# Patient Record
Sex: Female | Born: 1940 | Race: White | Hispanic: No | Smoking: Current every day smoker
Health system: Southern US, Community
[De-identification: ages and names within clinical notes are randomized; demographics above are authoritative.]

## PROBLEM LIST (undated history)

## (undated) DIAGNOSIS — I701 Atherosclerosis of renal artery: Secondary | ICD-10-CM

## (undated) DIAGNOSIS — E039 Hypothyroidism, unspecified: Secondary | ICD-10-CM

## (undated) DIAGNOSIS — K219 Gastro-esophageal reflux disease without esophagitis: Secondary | ICD-10-CM

## (undated) DIAGNOSIS — N183 Chronic kidney disease, stage 3 unspecified: Secondary | ICD-10-CM

## (undated) DIAGNOSIS — Z87442 Personal history of urinary calculi: Secondary | ICD-10-CM

## (undated) DIAGNOSIS — I5189 Other ill-defined heart diseases: Secondary | ICD-10-CM

## (undated) DIAGNOSIS — M5090 Cervical disc disorder, unspecified, unspecified cervical region: Secondary | ICD-10-CM

## (undated) DIAGNOSIS — Z9889 Other specified postprocedural states: Secondary | ICD-10-CM

## (undated) DIAGNOSIS — J449 Chronic obstructive pulmonary disease, unspecified: Secondary | ICD-10-CM

## (undated) DIAGNOSIS — S32599A Other specified fracture of unspecified pubis, initial encounter for closed fracture: Secondary | ICD-10-CM

## (undated) DIAGNOSIS — I2510 Atherosclerotic heart disease of native coronary artery without angina pectoris: Secondary | ICD-10-CM

## (undated) DIAGNOSIS — D649 Anemia, unspecified: Secondary | ICD-10-CM

## (undated) DIAGNOSIS — R112 Nausea with vomiting, unspecified: Secondary | ICD-10-CM

## (undated) DIAGNOSIS — I779 Disorder of arteries and arterioles, unspecified: Secondary | ICD-10-CM

## (undated) DIAGNOSIS — I739 Peripheral vascular disease, unspecified: Secondary | ICD-10-CM

## (undated) DIAGNOSIS — E781 Pure hyperglyceridemia: Secondary | ICD-10-CM

## (undated) DIAGNOSIS — I219 Acute myocardial infarction, unspecified: Secondary | ICD-10-CM

## (undated) DIAGNOSIS — I480 Paroxysmal atrial fibrillation: Secondary | ICD-10-CM

## (undated) DIAGNOSIS — I10 Essential (primary) hypertension: Secondary | ICD-10-CM

## (undated) HISTORY — PX: CARDIAC CATHETERIZATION: SHX172

## (undated) HISTORY — DX: Atherosclerotic heart disease of native coronary artery without angina pectoris: I25.10

## (undated) HISTORY — DX: Cervical disc disorder, unspecified, unspecified cervical region: M50.90

## (undated) HISTORY — PX: CHOLECYSTECTOMY: SHX55

## (undated) HISTORY — PX: TOTAL ABDOMINAL HYSTERECTOMY W/ BILATERAL SALPINGOOPHORECTOMY: SHX83

## (undated) HISTORY — PX: ABDOMINAL HYSTERECTOMY: SHX81

## (undated) HISTORY — PX: CORONARY ANGIOPLASTY: SHX604

## (undated) HISTORY — PX: CERVICAL DISC SURGERY: SHX588

## (undated) HISTORY — DX: Essential (primary) hypertension: I10

## (undated) HISTORY — PX: CARDIOVERSION: SHX1299

## (undated) HISTORY — DX: Pure hyperglyceridemia: E78.1

## (undated) HISTORY — DX: Disorder of arteries and arterioles, unspecified: I77.9

## (undated) HISTORY — PX: TOENAIL EXCISION: SHX183

## (undated) HISTORY — PX: SHOULDER SURGERY: SHX246

## (undated) HISTORY — DX: Peripheral vascular disease, unspecified: I73.9

---
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
## 1999-06-04 ENCOUNTER — Ambulatory Visit (HOSPITAL_COMMUNITY): Admission: RE | Admit: 1999-06-04 | Discharge: 1999-06-04 | Payer: Self-pay | Admitting: *Deleted

## 2000-07-05 HISTORY — PX: CATARACT EXTRACTION: SUR2

## 2001-07-05 DIAGNOSIS — I219 Acute myocardial infarction, unspecified: Secondary | ICD-10-CM

## 2001-07-05 HISTORY — DX: Acute myocardial infarction, unspecified: I21.9

## 2001-09-25 ENCOUNTER — Encounter: Payer: Self-pay | Admitting: Family Medicine

## 2001-09-25 ENCOUNTER — Ambulatory Visit (HOSPITAL_COMMUNITY): Admission: RE | Admit: 2001-09-25 | Discharge: 2001-09-25 | Payer: Self-pay | Admitting: Family Medicine

## 2002-03-17 ENCOUNTER — Emergency Department (HOSPITAL_COMMUNITY): Admission: EM | Admit: 2002-03-17 | Discharge: 2002-03-17 | Payer: Self-pay | Admitting: *Deleted

## 2002-03-17 ENCOUNTER — Encounter: Payer: Self-pay | Admitting: *Deleted

## 2002-05-03 ENCOUNTER — Ambulatory Visit (HOSPITAL_COMMUNITY): Admission: RE | Admit: 2002-05-03 | Discharge: 2002-05-03 | Payer: Self-pay | Admitting: Family Medicine

## 2002-05-03 ENCOUNTER — Encounter: Payer: Self-pay | Admitting: Family Medicine

## 2002-10-22 ENCOUNTER — Encounter: Payer: Self-pay | Admitting: *Deleted

## 2002-10-22 ENCOUNTER — Inpatient Hospital Stay (HOSPITAL_COMMUNITY): Admission: RE | Admit: 2002-10-22 | Discharge: 2002-10-24 | Payer: Self-pay | Admitting: *Deleted

## 2003-02-22 ENCOUNTER — Ambulatory Visit (HOSPITAL_COMMUNITY): Admission: RE | Admit: 2003-02-22 | Discharge: 2003-02-22 | Payer: Self-pay | Admitting: Family Medicine

## 2003-02-22 ENCOUNTER — Encounter: Payer: Self-pay | Admitting: Family Medicine

## 2004-04-21 ENCOUNTER — Ambulatory Visit (HOSPITAL_COMMUNITY): Admission: RE | Admit: 2004-04-21 | Discharge: 2004-04-21 | Payer: Self-pay | Admitting: Family Medicine

## 2004-05-04 ENCOUNTER — Ambulatory Visit (HOSPITAL_COMMUNITY): Admission: RE | Admit: 2004-05-04 | Discharge: 2004-05-04 | Payer: Self-pay | Admitting: Family Medicine

## 2004-09-16 ENCOUNTER — Ambulatory Visit (HOSPITAL_COMMUNITY): Admission: RE | Admit: 2004-09-16 | Discharge: 2004-09-16 | Payer: Self-pay | Admitting: *Deleted

## 2004-09-23 ENCOUNTER — Ambulatory Visit (HOSPITAL_COMMUNITY): Admission: RE | Admit: 2004-09-23 | Discharge: 2004-09-23 | Payer: Self-pay | Admitting: *Deleted

## 2006-02-04 ENCOUNTER — Ambulatory Visit (HOSPITAL_COMMUNITY): Admission: RE | Admit: 2006-02-04 | Discharge: 2006-02-04 | Payer: Self-pay | Admitting: Family Medicine

## 2006-07-28 ENCOUNTER — Ambulatory Visit (HOSPITAL_COMMUNITY): Admission: RE | Admit: 2006-07-28 | Discharge: 2006-07-28 | Payer: Self-pay | Admitting: Family Medicine

## 2006-09-16 ENCOUNTER — Inpatient Hospital Stay (HOSPITAL_COMMUNITY): Admission: RE | Admit: 2006-09-16 | Discharge: 2006-09-18 | Payer: Self-pay | Admitting: Neurosurgery

## 2006-11-04 ENCOUNTER — Ambulatory Visit (HOSPITAL_COMMUNITY): Admission: RE | Admit: 2006-11-04 | Discharge: 2006-11-04 | Payer: Self-pay | Admitting: Orthopaedic Surgery

## 2007-08-04 ENCOUNTER — Ambulatory Visit (HOSPITAL_COMMUNITY): Admission: RE | Admit: 2007-08-04 | Discharge: 2007-08-04 | Payer: Self-pay | Admitting: Family Medicine

## 2007-11-10 ENCOUNTER — Ambulatory Visit (HOSPITAL_COMMUNITY): Admission: RE | Admit: 2007-11-10 | Discharge: 2007-11-10 | Payer: Self-pay | Admitting: Family Medicine

## 2008-03-15 ENCOUNTER — Ambulatory Visit (HOSPITAL_COMMUNITY): Admission: RE | Admit: 2008-03-15 | Discharge: 2008-03-15 | Payer: Self-pay | Admitting: Family Medicine

## 2008-03-25 ENCOUNTER — Emergency Department: Payer: Self-pay | Admitting: Emergency Medicine

## 2008-04-15 ENCOUNTER — Ambulatory Visit: Payer: Self-pay | Admitting: Specialist

## 2008-07-05 HISTORY — PX: OTHER SURGICAL HISTORY: SHX169

## 2008-08-23 ENCOUNTER — Ambulatory Visit (HOSPITAL_COMMUNITY): Admission: RE | Admit: 2008-08-23 | Discharge: 2008-08-23 | Payer: Self-pay | Admitting: Family Medicine

## 2009-02-07 ENCOUNTER — Ambulatory Visit (HOSPITAL_COMMUNITY): Admission: RE | Admit: 2009-02-07 | Discharge: 2009-02-07 | Payer: Self-pay | Admitting: Orthopedic Surgery

## 2009-04-04 ENCOUNTER — Ambulatory Visit (HOSPITAL_COMMUNITY): Admission: RE | Admit: 2009-04-04 | Discharge: 2009-04-04 | Payer: Self-pay | Admitting: Orthopedic Surgery

## 2009-09-12 ENCOUNTER — Ambulatory Visit (HOSPITAL_COMMUNITY): Admission: RE | Admit: 2009-09-12 | Discharge: 2009-09-12 | Payer: Self-pay | Admitting: Family Medicine

## 2009-10-03 ENCOUNTER — Ambulatory Visit (HOSPITAL_COMMUNITY): Admission: RE | Admit: 2009-10-03 | Discharge: 2009-10-03 | Payer: Self-pay | Admitting: Family Medicine

## 2009-11-14 ENCOUNTER — Ambulatory Visit (HOSPITAL_COMMUNITY): Admission: RE | Admit: 2009-11-14 | Discharge: 2009-11-14 | Payer: Self-pay | Admitting: Cardiology

## 2010-09-07 ENCOUNTER — Other Ambulatory Visit (HOSPITAL_COMMUNITY): Payer: Self-pay | Admitting: Family Medicine

## 2010-09-07 DIAGNOSIS — Z139 Encounter for screening, unspecified: Secondary | ICD-10-CM

## 2010-09-27 LAB — CREATININE, SERUM: GFR calc Af Amer: >60 mL/min (ref >60)

## 2010-10-09 ENCOUNTER — Ambulatory Visit (HOSPITAL_COMMUNITY): Payer: PRIVATE HEALTH INSURANCE

## 2010-10-16 ENCOUNTER — Ambulatory Visit (HOSPITAL_COMMUNITY)
Admission: RE | Admit: 2010-10-16 | Discharge: 2010-10-16 | Disposition: A | Discharge status: Home / Self Care | Payer: PRIVATE HEALTH INSURANCE | Source: Ambulatory Visit | Attending: Family Medicine | Admitting: Family Medicine

## 2010-10-16 DIAGNOSIS — Z139 Encounter for screening, unspecified: Secondary | ICD-10-CM

## 2010-10-16 DIAGNOSIS — Z1231 Encounter for screening mammogram for malignant neoplasm of breast: Secondary | ICD-10-CM | POA: Insufficient documentation

## 2010-11-06 ENCOUNTER — Other Ambulatory Visit (HOSPITAL_COMMUNITY): Payer: Self-pay | Admitting: Family Medicine

## 2010-11-06 DIAGNOSIS — I2510 Atherosclerotic heart disease of native coronary artery without angina pectoris: Secondary | ICD-10-CM

## 2010-11-13 ENCOUNTER — Ambulatory Visit (HOSPITAL_COMMUNITY): Payer: PRIVATE HEALTH INSURANCE

## 2010-11-20 NOTE — H&P (Signed)
[name], SCIANDRA NO.:  [id number]   MEDICAL RECORD NO.:  [id number]          PATIENT TYPE:  INP   LOCATION:  3040                         FACILITY:  [facility]   PHYSICIAN:  Leeroy Cha, M.D.   DATE OF BIRTH:  September 23, 1940   DATE OF ADMISSION:  09/16/2006  DATE OF DISCHARGE:                              HISTORY & PHYSICAL   HISTORY OF PRESENT ILLNESS:  Ms. Matkin is a lady who came to my office  complaining of neck pain radiation to the shoulder for several months.  The pain is getting worse.  She feels that is it worse in the morning  and late in the afternoon, and any type of neck movement increases the  pain.  She was sent to use for evaluation after the findings on the MRI.   PAST MEDICAL HISTORY:  1. Foot surgery.  2. Cholecystectomy.  3. Hysterectomy.   ALLERGIES:  1. PENICILLIN.  2. SULFA.  3. CODEINE.   FAMILY HISTORY:  Negative.   REVIEW OF SYSTEMS:  Positive for foot pain, neck pain, and high  cholesterol.   PHYSICAL EXAMINATION:  HEENT:  Normal.  NECK:  She has pain with mobility of the cervical spine.  LUNGS:  Clear.  HEART:  Heart sounds normal.  ABDOMEN:  Normal.  EXTREMITIES:  Normal pulses.  NEUROLOGIC:  Mental status is apparently normal.  Strength shows  weakness of the triceps.  The strength is symmetrical in the upper and  lower extremities.  Coordination normal.   The x-rays show that she has degenerative disk disease at the lower C5-  6, C7.  The MRI showed that she has a herniated disk at the level C6-C7  centered to the right with displacement of the spinal cord and foraminal  stenosis without facet arthropathy at the level of C7-C1.   CLINICAL IMPRESSION:  C6-C7 herniated disk, C7 thoracic wall spondylosis  with radiculopathy.   RECOMMENDATIONS:  We talked about conservative treatment versus surgery.  The patient feels that the pain is getting worse.  She cannot live with  the pain, and she wants to proceed with surgery.   The procedure will be  anterior  6-7, 7-1 diskectomy and fusion.  She knows about the risks of infection,  CSF leak, worsening pain, no improvement whatsoever, failure of the bone  graft and the hardware, damage to carotid artery, vertebral, no  improvement, and reversibility of surgery from a posterior approach.           ______________________________  Leeroy Cha, M.D.     EB/MEDQ  D:  09/16/2006  T:  09/17/2006  Job:  [id number]

## 2010-11-20 NOTE — Discharge Summary (Signed)
   NAME:  [name], SODERMAN NO.:  [id number]   MEDICAL RECORD NO.:  [id number]                   PATIENT TYPE:  OIB   LOCATION:  [id number]                                 FACILITY:  [facility]   PHYSICIAN:  Octavia Heir, M.D.             DATE OF BIRTH:  Jun 13, 1941   DATE OF ADMISSION:  10/22/2002  DATE OF DISCHARGE:  10/24/2002                                 DISCHARGE SUMMARY   ADDENDUM:  The patient's third set of enzymes continued to climb to 181 with 26 MB.  She was kept overnight.  Her fourth set of enzymes dropped down to 167 with  21 MB.  She is asymptomatic.  We feel she can be discharged on October 24, 2002 and will follow up with Dr. Tami Ribas on Nov 08, 2002.  She has been  instructed to do no strenuous activity and no driving until she sees him  back.      Erlene Quan, P.A.                      Octavia Heir, M.D.    Meryl Dare  D:  10/24/2002  T:  10/25/2002  Job:  [id number]

## 2010-11-20 NOTE — Op Note (Signed)
NAMERUVI, CRIVELLI NO.:  [id number]   MEDICAL RECORD NO.:  [id number]          PATIENT TYPE:  INP   LOCATION:  3040                         FACILITY:  [facility]   PHYSICIAN:  Leeroy Cha, M.D.   DATE OF BIRTH:  06/26/1941   DATE OF PROCEDURE:  09/16/2006  DATE OF DISCHARGE:                               OPERATIVE REPORT   PREOPERATIVE DIAGNOSIS:  C6-C7 and C7-T1 spondylosis, herniated disc,  with right radiculopathy.   POSTOPERATIVE DIAGNOSIS:  C6-C7 and C7-T1 spondylosis, herniated disc,  with right radiculopathy.   PROCEDURE:  Anterior C6-C7 and C7-T1 discectomy, decompression of spinal  cord, foraminotomy, interbody fusion with allograft plate, microscope.   SURGEON:  Leeroy Cha, M.D.   ASSISTANT:  Ophelia Charter, M.D.   CLINICAL HISTORY:  The patient was admitted because of neck pain with  radiation to the right upper extremity.  X-rays showed degenerative disc  disease and spondylosis plus herniated disc at the level of C6-C7 and C7-  T1.  Surgery was advised and the risks were explained in the history and  physical.   DESCRIPTION OF PROCEDURE:  The patient was taken to the OR.  After  intubation, the neck was cleaned with DuraPrep.  Then, a transverse  incision through the skin, subcutaneous tissue, and platysma was carried  out.  Because she has a short neck, we put a needle and, indeed, that  was at the level of C5-C6.  From there on, we identified C6-C7 and C7-  T1.  The patient had two large osteophytes which was removed.  We opened  the anterior ligament. At the level C6-C7, we had to drill it because it  was calcified.  Nevertheless, once we found soft tissue, total gross  discectomy was achieved.  The same procedure was done at the level of C6-  C7.  We brought the microscope into the area and with the drill, we  decompressed the spinal cord.  The posterior ligament was opened and at  the level of C7-T1 to the right side, there was  4-5 fragments of disc.  Decompression of the C8 nerve root was achieved.  At the level of C6-C7,  we found quite a bit of swelling in that area.  Decompression was  achieved.  From then on, the endplates were drilled.  At the level of C7-  T1, we put an allograft of 7 mm and at the level of the C6-C7, one of 6  mm.  Then, a plate using six screw was done.  We attempted to do an x-  ray but we were able only to see the upper part of C6.  A drain was left  in the epidural space and the wound was closed with Vicryl and Steri-  Strips.           ______________________________  Leeroy Cha, M.D.     EB/MEDQ  D:  09/16/2006  T:  09/18/2006  Job:  [id number]

## 2010-11-20 NOTE — Cardiovascular Report (Signed)
NAME:  [name], [name] NO.:  [id number]   MEDICAL RECORD NO.:  [id number]                   PATIENT TYPE:  OIB   LOCATION:  6524                                 FACILITY:  [facility]   PHYSICIAN:  Octavia Heir, M.D.             DATE OF BIRTH:  1940-07-12   DATE OF PROCEDURE:  10/22/2002  DATE OF DISCHARGE:                              CARDIAC CATHETERIZATION   PROCEDURES PERFORMED:  1. Left heart catheterization.  2. Coronary angiography.  3. Left ventriculogram.  4. Left anterior descending-intravascular ultrasound imaging.  5. Placement of intracoronary stent.   CARDIOLOGIST:  Octavia Heir, M.D.   COMPLICATIONS:  None.   INDICATIONS FOR PROCEDURE:  The patient is a 70 year old female, patient of  Dr. Lemmie Evens, with a history of hypertension, tobacco use,  hyperlipidemia, and history of Cardiolite scan revealing inferior wall scar  with peri-infarct ischemia.  Subsequent cardiac catheterization in 2000  revealed 60% mid LAD with noncritical disease of the circumflex and RCA  system with normal EF.  She has had increasing shortness of breath and chest  pain, and does continue to smoke.  The patient is now admitted for repeat  catheterization.   DESCRIPTION OF PROCEDURE:  After obtaining informed consent the patient was  brought to the cardiac cath lab.  The right and left groins were shaved,  prepped and draped in the usual sterile fashion.  ECG monitoring was  established.  Using the modified Seldinger technique a #6 French arterial  sheath was inserted into the right femoral artery.  Six French diagnostic  catheter was used to perform diagnostic angiography.  This revealed short  left main, which appears normal.  The LAD is a medium size vessel that  courses the apex and gives rise to two diagonal branches.  The LAD has 40%  proximal and diffuse 80% mid-vessel stenotic lesion.  The first diagonal is  a medium size vessel that  bifurcates distally and has a 60% proximal lesion.  The second diagonal is a small vessel with no significant disease.  The left  circumflex is a medium size vessel, which courses the AV groove and give  rise to three obtuse marginal branches.  The AV groove circ has no  significant disease.  The first OM is a medium size vessel with no  significant disease.  The second OM is a large vessel that bifurcates  distally and has no significant disease.  The third OM is a medium size  vessel which bifurcates in its proximal segment and has a 6o% proximal  lesion in the upper and lower bifurcation.  The right coronary artery is a  medium size vessel, which is dominant and gives rise to the PDA as well as a  small posterolateral branch.  There is diffuse 50% proximal narrowing as  well as 50% mid and 20% distal stenosis.  The PDA has no significant  disease.   Left ventriculogram reveals significant ectopy with preserved EF at 55-60.   HEMODYNAMICS:  Systemic arterial pressure 134/57.  LV systolic pressure  [id number].  LVEDP of 13.   INTERVENTIONAL PROCEDURE:  LAD-mid:  Following diagnostic angiography a #6  French JL 4 guiding catheter was then placed and engaged the left coronary  ostium.  Next, a 0.014 short Fortaz marker wire was advanced down the  guiding catheter, positioned in the proximal LAD and measurements were  obtained.  The guide wire was then positioned in the distal LAD without  difficulty.  Next a Titanic catheter was then advanced to the  mid LAD and mechanical pullback was then performed.  In the mid LAD the  vessel diameter was approximately 2.7 x 3.0 mm with a luminal diameter of  approximately 1.5 x 2.0 mm.  Imaging catheter was then removed and a CYPHER  2.75 x 23 mm stent was then positioned in the mid LAD stenotic lesion.  The  stent was then deployed to a maximum of 18 atmospheres for a total of one  minute.  Follow up angiogram revealed no evidence of  dissecting thrombus and  TIMI III flow in the distal vessel.   IV Angiomax was used throughout the procedure.   Final orthogonal angiograms revealed less than 10% residual stenosis in the  mid LAD stenotic lesion with TIMI III flow to the distal vessel.  At this  point we concluded the procedure.  All balloon, __________ and catheters  were removed.  Hemostatic sheaths were centered in place and the patient was  taken back to the ward in stable condition.   CONCLUSION:  1. Successful intravascular ultrasound imaging and placement of a CYPHER     2.75 x 23 mm coronary stent in the mid left anterior descending stenotic     lesion.  2. Adjunct use of Angiomax infusion.                                                 Octavia Heir, M.D.    RHM/MEDQ  D:  10/22/2002  T:  10/22/2002  Job:  [id number]   cc:   Estill Bamberg. Karie Kirks, M.D.  [address] [place], [place] [address]  Fax: [phone]

## 2010-11-20 NOTE — Procedures (Signed)
   [name], [name]                            ACCOUNT NO.:  [id number]   MEDICAL RECORD NO.:  [id number]                   PATIENT TYPE:  EMS   LOCATION:  ED                                   FACILITY:  [facility]   PHYSICIAN:  Edward L. Luan Pulling, M.D.             DATE OF BIRTH:  1941/04/02   DATE OF PROCEDURE:  DATE OF DISCHARGE:  03/17/2002                                EKG INTERPRETATION   IMPRESSION:  The rhythm is sinus rhythm with a rate in the 70s.  There is  minimal ST elevation inferiorly which may be of no significant, but clinical  correlation is suggested.  Otherwise, normal electrocardiogram.                                               Edward L. Luan Pulling, M.D.    ELH/MEDQ  D:  04/25/2002  T:  04/26/2002  Job:  [id number]

## 2010-11-20 NOTE — Cardiovascular Report (Signed)
NAME:  [name], [name] NO.:  [id number]   MEDICAL RECORD NO.:  [id number]          PATIENT TYPE:  OIB   LOCATION:  2854                         FACILITY:  [facility]   PHYSICIAN:  Octavia Heir, MD  DATE OF BIRTH:  06-25-1941   DATE OF PROCEDURE:  09/23/2004  DATE OF DISCHARGE:                              CARDIAC CATHETERIZATION   PROCEDURES:  1.  Left heart catheterization.  2.  Coronary angiography.  3.  Left ventriculogram.   ATTENDING:  Octavia Heir, M.D.   COMPLICATIONS:  None.   INDICATIONS:  [name] is a 70 year old female, a patient of Estill Bamberg.  Karie Kirks, M.D., with a history of hypertension, hyperlipidemia, ongoing  tobacco use, history of CAD with stenting of her LAD in April of 2004 using  a Cypher 2.7 x 5 x 20 mm stent.  At that time, she was noted to have  noncritical disease in the remainder of her LAD, diagonal and obtuse  marginal, as well as RCA, with a normal EF.  She recently has been  complaining of increasing chest pain.  She is now for repeat catheterization  to reassess her CA.   DESCRIPTION OF OPERATION:  After getting informed written consent, the  patient was brought to the cardiac catheterization.  The right and left  groins were shaved, prepped and draped in the usual sterile fashion.  Anesthesia monitors were established.  Using modified Seldinger technique, a  #6 French arterial sheath entered the right femoral artery.  Then 6 French  diagnostic catheters were used to perform diagnostic angiography.   The left main was a large short vessel with no significant disease.   The LAD was a medium size vessel which gives access to two diagonal  branches.  The LAD was coarsely eroded in its proximal segment up to 40%.  There was a stent noted in the early mid portion of the LAD which was widely  patent.  The mid and distal portion of the LAD was irregular, but had no  high-grade stenosis.   The first diagonal was a medium  size vessel which bifurcates distally with a  30% ostial lesion.  The second diagonal was a small vessel with no  significant disease.   The left circumflex is a codominant which gives rise to two obtuse marginal  branches, as well as the PDA.  The AV groove circumflex has a 20% ostial  lesion.  The remainder of the AV groove circumflex has no significant  disease.   The OM is size vessel with 50% ostial lesion.  The second OM is a medium  size vessel which bifurcates distally with a 60% proximal lesion.  The PDA  is a medium size vessel with a 40% proximal lesion.   The right coronary artery is a medium size vessel which is also codominant  and gives rise to the PDA.  The RCA is noted to have diffuse 40-50% mid  vessel stenosis.   The left ventriculogram reveals a preserved EF of 50%.   HEMODYNAMICS:  Systemic arterial pressure 124/63.  LV systemic pressure  123/14.  LVEDP of 19.   CONCLUSION:  1.  Noncritical coronary artery disease.  2.  Normal left ventricular systolic function.      RHM/MEDQ  D:  09/23/2004  T:  09/23/2004  Job:  [id number]   cc:   Estill Bamberg. Karie Kirks, M.D.  [address] [place], [place] [address]  Fax: [phone]

## 2010-11-20 NOTE — Discharge Summary (Signed)
NAME:  [name], [name] NO.:  [id number]   MEDICAL RECORD NO.:  [id number]                   PATIENT TYPE:  OIB   LOCATION:  [id number]                                 FACILITY:  [facility]   PHYSICIAN:  Octavia Heir, M.D.             DATE OF BIRTH:  Oct 13, 1940   DATE OF ADMISSION:  10/22/2002  DATE OF DISCHARGE:  10/23/2002                                 DISCHARGE SUMMARY   DISCHARGE DIAGNOSES:  1. 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  2. History of coronary disease with previous catheterization in 2000 showing     60% left anterior descending, 50% obtuse marginal, 50% right coronary     artery with normal left ventricular function.  3. Chronic obstructive pulmonary disease and smoking.  4. Controlled hypertension.  5. Hyperlipidemia, currently not treated.   HOSPITAL COURSE:  The patient is a 70 year old female followed by Dr.  Karie Kirks with multiple risk factors for progression of coronary disease.  She had a catheterization that revealed moderate disease with normal LV  function.  She is seen in the office on September 27, 2002, with recurrent chest  pain concerning for unstable angina.  She was set up for repeat  catheterization which was done October 22, 2002, by Dr. Tami Ribas.  This  revealed a 50% RCA, 80% mid LAD, 60% third diagonal.  EF was 60%.  Dr.  Tami Ribas did perform IVUS of the LAD and she underwent CYPHER stent  placement.  CKs post procedure were elevated with a peak of 166 and 28 MB.  She tolerated this well.  Her EKG showed sinus rhythm with new T-wave  inversion V3 and V4 post procedure.  Dr. Tami Ribas feels she can be discharged  late on October 23, 2002, unless her third CK-MB continues to decline.  She is  pain-free currently.   DISCHARGE MEDICATIONS:  1. Plavix 75 mg a day for six months.  2. Aspirin 81 mg a day.  3. Accupril 20 mg a day.  4. Norvasc 5 mg a day.  5.  Toprol XL 50 mg a day.  6. Imdur 30 mg a day.  7. Nitroglycerin sublingual p.r.n.   We are working with her to try and get samples of her medications as she is  self-pay.   LABORATORY DATA:  White count 7.6, hemoglobin 12, hematocrit 35, platelets  266.  Sodium 138, potassium 4.1, BUN 5, creatinine 0.3.  Second CK peaked as  noted at 166 with 28 MB, troponin 1.4.   EKG on admission showed sinus rhythm, sinus bradycardia with no acute  changes.  EKG on October 23, 2002, showed sinus rhythm with nonspecific  changes with biphasic T-wave in V3 and V4.    DISPOSITION:  The patient will be discharged later today pending her third  CK-MB.  She has been instructed to perform no  strenuous activity for at  least two weeks.  She will see Dr. Tami Ribas on Nov 08, 2002, at 11 a.m.  We  did counsel her about smoking.     Erlene Quan, P.A.                      Octavia Heir, M.D.    Meryl Dare  D:  10/23/2002  T:  10/24/2002  Job:  [id number]   cc:   Estill Bamberg. Karie Kirks, M.D.  [address] [place], [place] [address]  Fax: [phone]

## 2010-11-27 ENCOUNTER — Ambulatory Visit (HOSPITAL_COMMUNITY)
Admission: RE | Admit: 2010-11-27 | Discharge: 2010-11-27 | Disposition: A | Discharge status: Home / Self Care | Payer: PRIVATE HEALTH INSURANCE | Source: Ambulatory Visit | Attending: Family Medicine | Admitting: Family Medicine

## 2010-11-27 DIAGNOSIS — I2510 Atherosclerotic heart disease of native coronary artery without angina pectoris: Secondary | ICD-10-CM | POA: Insufficient documentation

## 2010-11-27 DIAGNOSIS — I6529 Occlusion and stenosis of unspecified carotid artery: Secondary | ICD-10-CM | POA: Insufficient documentation

## 2010-12-28 ENCOUNTER — Ambulatory Visit (INDEPENDENT_AMBULATORY_CARE_PROVIDER_SITE_OTHER): Payer: PRIVATE HEALTH INSURANCE | Admitting: Internal Medicine

## 2011-02-03 ENCOUNTER — Encounter (INDEPENDENT_AMBULATORY_CARE_PROVIDER_SITE_OTHER): Payer: Self-pay

## 2011-02-24 ENCOUNTER — Encounter (INDEPENDENT_AMBULATORY_CARE_PROVIDER_SITE_OTHER): Payer: Self-pay | Admitting: *Deleted

## 2011-03-24 ENCOUNTER — Telehealth (INDEPENDENT_AMBULATORY_CARE_PROVIDER_SITE_OTHER): Payer: Self-pay | Admitting: *Deleted

## 2011-03-24 ENCOUNTER — Ambulatory Visit (INDEPENDENT_AMBULATORY_CARE_PROVIDER_SITE_OTHER): Payer: PRIVATE HEALTH INSURANCE | Admitting: Internal Medicine

## 2011-03-24 ENCOUNTER — Encounter (INDEPENDENT_AMBULATORY_CARE_PROVIDER_SITE_OTHER): Payer: Self-pay | Admitting: Internal Medicine

## 2011-03-24 ENCOUNTER — Encounter (INDEPENDENT_AMBULATORY_CARE_PROVIDER_SITE_OTHER): Payer: Self-pay | Admitting: *Deleted

## 2011-03-24 VITALS — BP 130/58 | HR 76 | Ht 68.0 in | Wt 188.6 lb

## 2011-03-24 DIAGNOSIS — R1310 Dysphagia, unspecified: Secondary | ICD-10-CM

## 2011-03-24 DIAGNOSIS — R1314 Dysphagia, pharyngoesophageal phase: Secondary | ICD-10-CM

## 2011-03-24 NOTE — Telephone Encounter (Signed)
EGD/ED sch'd 05/05/11 @ 2:00 (1:00), asa 2 days

## 2011-03-24 NOTE — Progress Notes (Signed)
Subjective:     Patient ID: [name], female   DOB: 08-Oct-1940, 70 y.o.   MRN: [id number]  HPI  [name] is a 70 yr old female referred to our office for dysphagia. She tells me sometimes when she eats foods will lodge in her mid-esophagus. She has to burp to get the bolus up or throw it up.  It is occuring about once every 2 weeks. Progressively worsened.  Symptoms x 1-2 yrs.  Meats and raw lettuce will lodge. No problems swallowing pills. Appetite is good. No weight loss. No abdominal pain,. Usually has a BM every day. Brown in color. No bright red rectal bleeding or melena.  Review of Systems  See hpi  Current Outpatient Prescriptions  Medication Sig Dispense Refill  . albuterol (PROVENTIL) (2.5 MG/3ML) 0.083% nebulizer solution Take by nebulization every 6 (six) hours as needed.        Marland Kitchen amLODipine (NORVASC) 10 MG tablet Take 10 mg by mouth daily.        . Ascorbic Acid (VITAMIN C) 100 MG tablet Take 100 mg by mouth daily.        Marland Kitchen aspirin 81 MG tablet Take 81 mg by mouth daily.        . clonazePAM (KLONOPIN) 0.5 MG tablet Take 0.5 mg by mouth at bedtime as needed.       . clonazePAM (KLONOPIN) 0.5 MG tablet Take 0.5 mg by mouth 2 (two) times daily as needed.        . clopidogrel (PLAVIX) 75 MG tablet Take 75 mg by mouth daily.        Marland Kitchen estrogens, conjugated, (PREMARIN) 1.25 MG tablet Take 1.25 mg by mouth daily as needed.        . isosorbide mononitrate (IMDUR) 60 MG 24 hr tablet Take 60 mg by mouth daily.        Marland Kitchen lisinopril-hydrochlorothiazide (PRINZIDE,ZESTORETIC) 20-12.5 MG per tablet Take 1 tablet by mouth daily.        . metoprolol succinate (TOPROL-XL) 25 MG 24 hr tablet Take 25 mg by mouth 2 (two) times daily before a meal.        . omega-3 acid ethyl esters (LOVAZA) 1 G capsule Take 2 g by mouth 2 (two) times daily.        . promethazine (PHENERGAN) 25 MG tablet Take 25 mg by mouth every 6 (six) hours as needed.         History   Social History Narrative  . No narrative on  file   Past Medical History  Diagnosis Date  . Dysphagia   . CAD (coronary artery disease)   . MI (myocardial infarction)     2003 with one stent placement  . Hypertension     2002  . Hypertriglyceridemia    Allergies  Allergen Reactions  . Ceclor (Cefaclor)   . Codeine     Cramps, nausea  . Penicillins     Near death experience,   . Sulfa Antibiotics     rash   Family Status  Relation Status Death Age  . Mother Deceased     CVAs  . Father Deceased     MI at age 39  . Sister      One deceased from an MI, one deceased from pancreatic cancer, rest are in fair health  . Brother Research scientist (medical)  . Child Deceased     occupational accident. Husband deceased from NALD  Objective:   Physical Exam Filed Vitals:   03/24/11 1417  BP: 130/58  Pulse: 76   Filed Vitals:   03/24/11 1417  Height: 5\' 8"  (1.727 m)  Weight: 188 lb 9.6 oz (85.548 kg)    Alert and oriented. Skin warm and dry. Oral mucosa is moist. Natural teeth in poor condition with upper edentulous.  Sclera anicteric, conjunctivae is pink. Thyroid not enlarged. No cervical lymphadenopathy. Lungs clear. Heart regular rate and rhythm.  Abdomen is soft. Bowel sounds are positive. No hepatomegaly. No abdominal masses felt. No tenderness.  No edema to lower extremities. Patient is alert and oriented.      Assessment:    Solid food dysphagia.  Stricture or web need to be ruled out    Plan:    EGD/ED in the near future.   The risks and benefits such as perforation, bleeding, and infection were reviewed with the patient and is agreeable.

## 2011-04-07 ENCOUNTER — Telehealth (INDEPENDENT_AMBULATORY_CARE_PROVIDER_SITE_OTHER): Payer: Self-pay | Admitting: *Deleted

## 2011-04-07 ENCOUNTER — Other Ambulatory Visit (INDEPENDENT_AMBULATORY_CARE_PROVIDER_SITE_OTHER): Payer: Self-pay | Admitting: *Deleted

## 2011-04-07 ENCOUNTER — Encounter (INDEPENDENT_AMBULATORY_CARE_PROVIDER_SITE_OTHER): Payer: Self-pay | Admitting: *Deleted

## 2011-04-07 DIAGNOSIS — Z1211 Encounter for screening for malignant neoplasm of colon: Secondary | ICD-10-CM

## 2011-04-07 NOTE — Telephone Encounter (Signed)
I spoke with patient. She is scheduled for an EGD/ED and a colonoscopy

## 2011-04-07 NOTE — Telephone Encounter (Signed)
You saw Ms Glazer for dysphagia and she is sch'd for EGD/ED 05/05/11, she lmom asking if she can have TCS at same time since she' never had one, please Arnoldo Hooker

## 2011-05-04 MED ORDER — SODIUM CHLORIDE 0.45 % IV SOLN
Freq: Once | INTRAVENOUS | Status: AC
  Administered 2011-05-05: 15:00:00 via INTRAVENOUS

## 2011-05-05 ENCOUNTER — Ambulatory Visit: Admit: 2011-05-05 | Payer: Self-pay | Admitting: Internal Medicine

## 2011-05-05 ENCOUNTER — Ambulatory Visit (HOSPITAL_COMMUNITY)
Admission: RE | Admit: 2011-05-05 | Discharge: 2011-05-05 | Disposition: A | Discharge status: Home / Self Care | Payer: PRIVATE HEALTH INSURANCE | Source: Ambulatory Visit | Attending: Internal Medicine | Admitting: Internal Medicine

## 2011-05-05 ENCOUNTER — Encounter (HOSPITAL_COMMUNITY): Payer: Self-pay | Admitting: *Deleted

## 2011-05-05 ENCOUNTER — Encounter (HOSPITAL_COMMUNITY)
Admission: RE | Disposition: A | Discharge status: Home / Self Care | Payer: Self-pay | Source: Ambulatory Visit | Attending: Internal Medicine

## 2011-05-05 DIAGNOSIS — J449 Chronic obstructive pulmonary disease, unspecified: Secondary | ICD-10-CM | POA: Insufficient documentation

## 2011-05-05 DIAGNOSIS — Z79899 Other long term (current) drug therapy: Secondary | ICD-10-CM | POA: Insufficient documentation

## 2011-05-05 DIAGNOSIS — Z7982 Long term (current) use of aspirin: Secondary | ICD-10-CM | POA: Insufficient documentation

## 2011-05-05 DIAGNOSIS — I10 Essential (primary) hypertension: Secondary | ICD-10-CM | POA: Insufficient documentation

## 2011-05-05 DIAGNOSIS — R1310 Dysphagia, unspecified: Secondary | ICD-10-CM

## 2011-05-05 DIAGNOSIS — J4489 Other specified chronic obstructive pulmonary disease: Secondary | ICD-10-CM | POA: Insufficient documentation

## 2011-05-05 DIAGNOSIS — K5730 Diverticulosis of large intestine without perforation or abscess without bleeding: Secondary | ICD-10-CM | POA: Insufficient documentation

## 2011-05-05 DIAGNOSIS — Z1211 Encounter for screening for malignant neoplasm of colon: Secondary | ICD-10-CM

## 2011-05-05 DIAGNOSIS — E781 Pure hyperglyceridemia: Secondary | ICD-10-CM | POA: Insufficient documentation

## 2011-05-05 DIAGNOSIS — K2990 Gastroduodenitis, unspecified, without bleeding: Secondary | ICD-10-CM

## 2011-05-05 DIAGNOSIS — K2970 Gastritis, unspecified, without bleeding: Secondary | ICD-10-CM

## 2011-05-05 HISTORY — PX: MALONEY DILATION: SHX5535

## 2011-05-05 HISTORY — PX: BALLOON DILATION: SHX5330

## 2011-05-05 HISTORY — DX: Chronic obstructive pulmonary disease, unspecified: J44.9

## 2011-05-05 HISTORY — PX: SAVORY DILATION: SHX5439

## 2011-05-05 HISTORY — DX: Gastro-esophageal reflux disease without esophagitis: K21.9

## 2011-05-05 SURGERY — ESOPHAGOGASTRODUODENOSCOPY (EGD) WITH ESOPHAGEAL DILATION
Anesthesia: Moderate Sedation

## 2011-05-05 SURGERY — COLONOSCOPY WITH ESOPHAGOGASTRODUODENOSCOPY (EGD)
Anesthesia: Moderate Sedation

## 2011-05-05 MED ORDER — PANTOPRAZOLE SODIUM 40 MG PO TBEC: 40.0000 mg | DELAYED_RELEASE_TABLET | Freq: Every day | ORAL | Status: DC

## 2011-05-05 MED ORDER — BUTAMBEN-TETRACAINE-BENZOCAINE 2-2-14 % EX AERO
INHALATION_SPRAY | CUTANEOUS | Status: DC | PRN
  Administered 2011-05-05: 2 via TOPICAL

## 2011-05-05 MED ORDER — MIDAZOLAM HCL 5 MG/5ML IJ SOLN
INTRAMUSCULAR | Status: DC | PRN
  Administered 2011-05-05: 1 mg via INTRAVENOUS
  Administered 2011-05-05: 2 mg via INTRAVENOUS
  Administered 2011-05-05: 1 mg via INTRAVENOUS
  Administered 2011-05-05: 2 mg via INTRAVENOUS

## 2011-05-05 MED ORDER — STERILE WATER FOR IRRIGATION IR SOLN
Status: DC | PRN
  Administered 2011-05-05: 16:00:00

## 2011-05-05 MED ORDER — MEPERIDINE HCL 50 MG/ML IJ SOLN
INTRAMUSCULAR | Status: AC
  Filled 2011-05-05: qty 1

## 2011-05-05 MED ORDER — MIDAZOLAM HCL 5 MG/5ML IJ SOLN
INTRAMUSCULAR | Status: AC
  Filled 2011-05-05: qty 10

## 2011-05-05 MED ORDER — MEPERIDINE HCL 50 MG/ML IJ SOLN
INTRAMUSCULAR | Status: DC | PRN
Start: 2011-05-05 — End: 2011-05-05
  Administered 2011-05-05 (×2): 25 mg via INTRAVENOUS

## 2011-05-05 NOTE — H&P (Signed)
[name] is an 70 y.o. female.   Chief Complaint:  Patient is here for EGD and possible ED and a colonoscopy. HPI:  Patient is 70 year old Caucasian female who's been having intermittent solid food dysphagia for about 18 months. It has been occurring more frequent lately. She points to the midsternal area  as the site of bolus obstruction. Have to time ago stone and other times she has to regurgitate food bolus.   She has good appetite and has not lost any weight. She denies frequent heartburn but does have intermittent regurgitation and burping. Bowels are regular no history of rectal bleeding . She has never been screened for CRC before.  Family history is negative for colorectal carcinoma  She lost a sister of pancreatic carcinoma at age 66 within few months of diagnosis.  Past Medical History  Diagnosis Date  . Dysphagia   . CAD (coronary artery disease)   . MI (myocardial infarction)     2003 with one stent placement  . Hypertension     2002  . Hypertriglyceridemia   . COPD (chronic obstructive pulmonary disease)   . GERD (gastroesophageal reflux disease)   . Kidney stone     Past Surgical History  Procedure Date  . Cholecystectomy   . Total abdominal hysterectomy w/ bilateral salpingoophorectomy   . Shoulder surgery     torn rotator cuff  . Cardiac catheterization     with stent  . Right knee arthroscopy 2010    Family History  Problem Relation Age of Onset  . Colon cancer Neg Hx    Social History:  reports that she has been smoking.  She does not have any smokeless tobacco history on file. She reports that she drinks about .6 ounces of alcohol per week. She reports that she does not use illicit drugs.  Allergies:  Allergies  Allergen Reactions  . Ceclor (Cefaclor) Nausea And Vomiting  . Codeine     Cramps, nausea  . Penicillins     Near death experience,   . Sulfa Antibiotics     rash    Medications Prior to Admission  Medication Dose Route Frequency  Provider Last Rate Last Dose  . 0.45 % sodium chloride infusion   Intravenous Once Rogene Houston, MD 20 mL/hr at 05/05/11 1438    . meperidine (DEMEROL) 50 MG/ML injection           . midazolam (VERSED) 5 MG/5ML injection            Medications Prior to Admission  Medication Sig Dispense Refill  . albuterol (PROVENTIL) (2.5 MG/3ML) 0.083% nebulizer solution Take 2.5 mg by nebulization every 6 (six) hours as needed. For shortness of breath      . amLODipine (NORVASC) 10 MG tablet Take 10 mg by mouth daily.        . Ascorbic Acid (VITAMIN C) 100 MG tablet Take 100 mg by mouth daily.       Marland Kitchen aspirin 81 MG tablet Take 81 mg by mouth daily.        . cholecalciferol (VITAMIN D) 400 UNITS TABS Take 400 Units by mouth daily.        . clonazePAM (KLONOPIN) 0.5 MG tablet Take 0.5 mg by mouth at bedtime as needed. For sleep      . estrogens, conjugated, (PREMARIN) 1.25 MG tablet Take 1.25 mg by mouth daily as needed. For hot flashes      . isosorbide mononitrate (IMDUR) 60 MG 24 hr tablet  Take 60 mg by mouth daily.        Marland Kitchen lisinopril-hydrochlorothiazide (PRINZIDE,ZESTORETIC) 20-12.5 MG per tablet Take 1 tablet by mouth daily.        . metoprolol tartrate (LOPRESSOR) 25 MG tablet Take 25 mg by mouth 2 (two) times daily.        Marland Kitchen omega-3 acid ethyl esters (LOVAZA) 1 G capsule Take 2 g by mouth 2 (two) times daily.        . promethazine (PHENERGAN) 25 MG tablet Take 25 mg by mouth every 6 (six) hours as needed. For nausea      . clonazePAM (KLONOPIN) 0.5 MG tablet Take 0.5 mg by mouth 2 (two) times daily as needed.        . clopidogrel (PLAVIX) 75 MG tablet Take 75 mg by mouth daily.        . metoprolol succinate (TOPROL-XL) 25 MG 24 hr tablet Take 25 mg by mouth 2 (two) times daily before a meal.          No results found for this or any previous visit (from the past 48 hour(s)). No results found.  Review of Systems  Constitutional: Negative for weight loss.  Gastrointestinal: Negative for  heartburn, nausea, vomiting, abdominal pain, diarrhea, constipation, blood in stool and melena.    Blood pressure 138/57, pulse 63, temperature 98 F (36.7 C), temperature source Oral, resp. rate 26, height 5\' 8"  (1.727 m), weight 180 lb (81.647 kg), SpO2 98.00%. Physical Exam  Constitutional: She appears well-developed and well-nourished.  HENT:  Mouth/Throat: Oropharynx is clear and moist.  Eyes: Conjunctivae are normal. No scleral icterus.  Neck: No thyromegaly present.  Cardiovascular: Normal rate, regular rhythm and normal heart sounds.   No murmur heard. Respiratory: Effort normal and breath sounds normal.  GI: Soft. She exhibits no distension and no mass. There is no tenderness.  Musculoskeletal: She exhibits no edema.  Lymphadenopathy:    She has no cervical adenopathy.  Neurological: She is alert.  Skin: Skin is warm and dry.     Assessment/Plan  Solid food dysphagia.  EGD and possible ED.  Average risk screening colonoscopy.  [name] U 05/05/2011, 3:25 PM

## 2011-05-05 NOTE — Op Note (Signed)
EGD and COLONOSCOPY  PROCEDURE REPORT  PATIENT:  [name]  MR#:  [id number] Birthdate:  1940/08/19, 70 y.o., female Endoscopist:  Dr. Rogene Houston, MD Referred By:  Dr. Rayne Du ref. provider found Procedure Date: 05/05/2011  Procedure:   EGD, ED  & Colonoscopy  Indications:   Patient is 70 year old Caucasian female who is 18 month history of intermittent solid food dysphagia. Is undergoing diagnostic/therapeutic EGD and average risk screening colonoscopy.            Informed Consent:  Both the procedures risks were reviewed with the patient and informed consent was obtained. Medications:  Demerol 50 mg IV Versed  6 mg IV Cetacaine spray topically for oropharyngeal anesthesia  EGD  Description of procedure:  The endoscope was introduced through the mouth and advanced to the second portion of the duodenum without difficulty or limitations. The mucosal surfaces were surveyed very carefully during advancement of the scope and upon withdrawal.  Findings:  Esophagus:   Esophageal mucosa was normal. GE junction was unremarkable without ring or stricture formation. GEJ:   42 cm Stomach:   Stomach was empty and distended very well with insufflation. Folds in the proximal stomach were normal. Examination of mucosa revealed a few erosions at gastric body and multiple erosions at antrum.  Pyloric  channel was patent. Duodenum:   Normal bulbar and post bulbar mucosa.  Therapeutic/Diagnostic Maneuvers Performed:   Esophageal dilation performed by passing the 56 French Maloney dilator to full insertion but no mucosal disruption noted.  COLONOSCOPY Description of procedure:  After a digital rectal exam was performed, that colonoscope was advanced from the anus through the rectum and colon to the area of the cecum, ileocecal valve and appendiceal orifice. The cecum was deeply intubated. These structures were well-seen and photographed for the record. From the level of the cecum and ileocecal valve,  the scope was slowly and cautiously withdrawn. The mucosal surfaces were carefully surveyed utilizing scope tip to flexion to facilitate fold flattening as needed. The scope was pulled down into the rectum where a thorough exam including retroflexion was performed.  Findings:    Prep excellent.  Normal mucosa throughout.  Few small diverticula at sigmoid colon.   Normal rectal mucosa as well as no rectal junction.  Therapeutic/Diagnostic Maneuvers Performed:   none  Complications:   none  Cecal Withdrawal Time:   9 minutes  Impression:   Erosive  gastritis otherwise normal EGD.  Esophagus dilated by passing 56 French Maloney dilator    but no mucosal disruption induced.  few small diverticula at sigmoid colon otherwise normal  colonoscopy.  Recommendations:   Standard instructions given.    We will check H. Pylori serology today.    Pantoprazole 40 mg by mouth 30 minutes before                 breakfast daily.       Patient advised to take Plavix in the evening.        If patient remains with dysphagia will consider barium pill esophagogram.       [name]  05/05/2011 4:04 PM  CC: Dr. Robert Bellow, MD & Dr. Rayne Du ref. provider found

## 2011-05-06 LAB — H. PYLORI ANTIBODY, IGG: H Pylori IgG: <0.4 {ISR}

## 2011-05-11 ENCOUNTER — Encounter (HOSPITAL_COMMUNITY): Payer: Self-pay | Admitting: Internal Medicine

## 2011-10-25 ENCOUNTER — Other Ambulatory Visit (HOSPITAL_COMMUNITY): Payer: Self-pay | Admitting: Family Medicine

## 2011-10-25 DIAGNOSIS — Z139 Encounter for screening, unspecified: Secondary | ICD-10-CM

## 2011-11-04 ENCOUNTER — Ambulatory Visit (HOSPITAL_COMMUNITY): Payer: Self-pay

## 2011-11-04 ENCOUNTER — Ambulatory Visit (HOSPITAL_COMMUNITY)
Admission: RE | Admit: 2011-11-04 | Discharge: 2011-11-04 | Disposition: A | Discharge status: Home / Self Care | Payer: Medicare Other | Source: Ambulatory Visit | Attending: Family Medicine | Admitting: Family Medicine

## 2011-11-04 DIAGNOSIS — Z1231 Encounter for screening mammogram for malignant neoplasm of breast: Secondary | ICD-10-CM | POA: Insufficient documentation

## 2011-11-04 DIAGNOSIS — Z139 Encounter for screening, unspecified: Secondary | ICD-10-CM

## 2011-11-11 ENCOUNTER — Other Ambulatory Visit (HOSPITAL_COMMUNITY): Payer: Self-pay | Admitting: Internal Medicine

## 2011-11-11 DIAGNOSIS — I2510 Atherosclerotic heart disease of native coronary artery without angina pectoris: Secondary | ICD-10-CM

## 2011-11-25 ENCOUNTER — Ambulatory Visit (HOSPITAL_COMMUNITY)
Admission: RE | Admit: 2011-11-25 | Discharge: 2011-11-25 | Disposition: A | Discharge status: Home / Self Care | Payer: Medicare Other | Source: Ambulatory Visit | Attending: Internal Medicine | Admitting: Internal Medicine

## 2011-11-25 DIAGNOSIS — F17200 Nicotine dependence, unspecified, uncomplicated: Secondary | ICD-10-CM | POA: Insufficient documentation

## 2011-11-25 DIAGNOSIS — I2510 Atherosclerotic heart disease of native coronary artery without angina pectoris: Secondary | ICD-10-CM | POA: Insufficient documentation

## 2011-11-25 DIAGNOSIS — I6529 Occlusion and stenosis of unspecified carotid artery: Secondary | ICD-10-CM | POA: Insufficient documentation

## 2011-11-25 DIAGNOSIS — J4489 Other specified chronic obstructive pulmonary disease: Secondary | ICD-10-CM | POA: Insufficient documentation

## 2011-11-25 DIAGNOSIS — J449 Chronic obstructive pulmonary disease, unspecified: Secondary | ICD-10-CM | POA: Insufficient documentation

## 2011-11-25 DIAGNOSIS — I10 Essential (primary) hypertension: Secondary | ICD-10-CM | POA: Insufficient documentation

## 2012-05-05 ENCOUNTER — Other Ambulatory Visit: Payer: Self-pay | Admitting: Neurosurgery

## 2012-05-05 DIAGNOSIS — M542 Cervicalgia: Secondary | ICD-10-CM

## 2012-05-15 ENCOUNTER — Other Ambulatory Visit: Payer: Medicare Other

## 2012-05-16 ENCOUNTER — Ambulatory Visit
Admission: RE | Admit: 2012-05-16 | Discharge: 2012-05-16 | Disposition: A | Discharge status: Home / Self Care | Payer: Medicare Other | Source: Ambulatory Visit | Attending: Neurosurgery | Admitting: Neurosurgery

## 2012-05-16 DIAGNOSIS — M542 Cervicalgia: Secondary | ICD-10-CM

## 2012-06-21 ENCOUNTER — Other Ambulatory Visit: Payer: Self-pay | Admitting: Neurosurgery

## 2012-07-07 ENCOUNTER — Other Ambulatory Visit: Payer: Self-pay | Admitting: Neurosurgery

## 2012-07-07 DIAGNOSIS — M542 Cervicalgia: Secondary | ICD-10-CM

## 2012-07-07 DIAGNOSIS — M5410 Radiculopathy, site unspecified: Secondary | ICD-10-CM

## 2012-07-18 ENCOUNTER — Other Ambulatory Visit: Payer: Medicare Other

## 2012-08-08 ENCOUNTER — Ambulatory Visit
Admission: RE | Admit: 2012-08-08 | Discharge: 2012-08-08 | Disposition: A | Discharge status: Home / Self Care | Payer: PRIVATE HEALTH INSURANCE | Source: Ambulatory Visit | Attending: Neurosurgery | Admitting: Neurosurgery

## 2012-08-08 DIAGNOSIS — M542 Cervicalgia: Secondary | ICD-10-CM

## 2012-08-08 DIAGNOSIS — M5410 Radiculopathy, site unspecified: Secondary | ICD-10-CM

## 2012-08-08 MED ORDER — TRIAMCINOLONE ACETONIDE 40 MG/ML IJ SUSP (RADIOLOGY)
60.0000 mg | Freq: Once | INTRAMUSCULAR | Status: AC
  Administered 2012-08-08: 60 mg via EPIDURAL

## 2012-08-08 MED ORDER — IOHEXOL 300 MG/ML  SOLN
1.0000 mL | Freq: Once | INTRAMUSCULAR | Status: AC | PRN
  Administered 2012-08-08: 1 mL via EPIDURAL

## 2012-10-10 ENCOUNTER — Other Ambulatory Visit (HOSPITAL_COMMUNITY): Payer: Self-pay | Admitting: Internal Medicine

## 2012-10-10 DIAGNOSIS — R079 Chest pain, unspecified: Secondary | ICD-10-CM

## 2012-10-10 DIAGNOSIS — I25810 Atherosclerosis of coronary artery bypass graft(s) without angina pectoris: Secondary | ICD-10-CM

## 2012-10-18 ENCOUNTER — Ambulatory Visit (HOSPITAL_COMMUNITY)
Admission: RE | Admit: 2012-10-18 | Discharge: 2012-10-18 | Disposition: A | Discharge status: Home / Self Care | Payer: Medicare Other | Source: Ambulatory Visit | Attending: Internal Medicine | Admitting: Internal Medicine

## 2012-10-18 DIAGNOSIS — R079 Chest pain, unspecified: Secondary | ICD-10-CM | POA: Insufficient documentation

## 2012-10-18 DIAGNOSIS — I25810 Atherosclerosis of coronary artery bypass graft(s) without angina pectoris: Secondary | ICD-10-CM | POA: Insufficient documentation

## 2012-10-18 MED ORDER — TECHNETIUM TC 99M SESTAMIBI GENERIC - CARDIOLITE
26.0000 | Freq: Once | INTRAVENOUS | Status: AC | PRN
  Administered 2012-10-18: 26 via INTRAVENOUS

## 2012-10-18 MED ORDER — TECHNETIUM TC 99M SESTAMIBI GENERIC - CARDIOLITE
8.5000 | Freq: Once | INTRAVENOUS | Status: AC | PRN
  Administered 2012-10-18: 9 via INTRAVENOUS

## 2012-10-18 MED ORDER — AMINOPHYLLINE 25 MG/ML IV SOLN
75.0000 mg | Freq: Once | INTRAVENOUS | Status: AC
  Administered 2012-10-18: 75 mg via INTRAVENOUS

## 2012-10-18 MED ORDER — REGADENOSON 0.4 MG/5ML IV SOLN
0.4000 mg | Freq: Once | INTRAVENOUS | Status: AC
  Administered 2012-10-18: 0.4 mg via INTRAVENOUS

## 2012-10-18 NOTE — Procedures (Addendum)
[facility] [address] [place] [id number]  Cardiology Nuclear Med Study  [name] is a 72 y.o. female     MRN : [id number]     DOB: 1941/05/14  Procedure Date: 10/18/2012  Nuclear Med Background Indication for Stress Test:  Stent Patency, PTCA Patency and Abnormal EKG History:  Asthma, COPD and CAD;MI;STENT/PTCA--2004 Cardiac Risk Factors: Carotid Disease, Family History - CAD, Hypertension, Lipids, Obesity and Smoker  Symptoms:  Chest Pain, Dizziness, Fatigue, Light-Headedness and SOB   Nuclear Pre-Procedure Caffeine/Decaff Intake:  10:00pm NPO After: 8:00am   IV Site: R Forearm  IV 0.9% NS with Angio Cath:  22g  Chest Size (in):  N/A IV Started by: Azucena Cecil, RN  Height: 5\' 7"  (1.702 m)  Cup Size: B  BMI:  Body mass index is 29.44 kg/(m^2). Weight:  188 lb (85.276 kg)   Tech Comments:  N/A    Nuclear Med Study 1 or 2 day study: 1 day  Stress Test Type:  Lexiscan  Order Authorizing Provider:  Lyman Bishop, MD   Resting Radionuclide: Technetium 76m Sestamibi  Resting Radionuclide Dose: 8.5 mCi   Stress Radionuclide:  Technetium 69m Sestamibi  Stress Radionuclide Dose: 26.0 mCi           Stress Protocol Rest HR: 73 Stress HR: 84  Rest BP: 111/59 Stress BP: 127/56  Exercise Time (min): n/a METS: n/a          Dose of Adenosine (mg):  n/a Dose of Lexiscan: 0.4 mg  Dose of Atropine (mg): n/a Dose of Dobutamine: n/a mcg/kg/min (at max HR)  Stress Test Technologist: Mellody Memos, CCT Nuclear Technologist: Imagene Riches, CNMT   Rest Procedure:  Myocardial perfusion imaging was performed at rest 45 minutes following the intravenous administration of Technetium 66m Sestamibi. Stress Procedure:  The patient received IV Lexiscan 0.4 mg over 15-seconds.  Technetium 73m Sestamibi injected at 30-seconds.  Due to patient's shortness of breath, she was given IV Aminophylline 75 mg. Symptom was  resolved.There were no significant changes with Lexiscan.  Quantitative spect images were obtained after a 45 minute delay.  Transient Ischemic Dilatation (Normal <1.22):  0.98 Lung/Heart Ratio (Normal <0.45):  0.32 QGS EDV:  76 ml QGS ESV:  17 ml LV Ejection Fraction: 77%  Signed by       Rest ECG: NSR - Normal EKG  Stress ECG: No significant change from baseline ECG  QPS Raw Data Images:  Normal; no motion artifact; normal heart/lung ratio. Stress Images:  Normal homogeneous uptake in all areas of the myocardium. Rest Images:  Normal homogeneous uptake in all areas of the myocardium. Subtraction (SDS):  No evidence of ischemia.  Impression Exercise Capacity:  Lexiscan with no exercise. BP Response:  Normal blood pressure response. Clinical Symptoms:  No significant symptoms noted. ECG Impression:  No significant ST segment change suggestive of ischemia. Comparison with Prior Nuclear Study: No significant change from previous study  Overall Impression:  Normal stress nuclear study.  LV Wall Motion:  Normal Wall Motion   Lorretta Harp, MD  10/18/2012 5:52 PM

## 2012-10-25 ENCOUNTER — Other Ambulatory Visit (HOSPITAL_COMMUNITY): Payer: Self-pay | Admitting: Family Medicine

## 2012-10-25 DIAGNOSIS — Z139 Encounter for screening, unspecified: Secondary | ICD-10-CM

## 2012-11-09 ENCOUNTER — Ambulatory Visit (HOSPITAL_COMMUNITY)
Admission: RE | Admit: 2012-11-09 | Discharge: 2012-11-09 | Disposition: A | Discharge status: Home / Self Care | Payer: Medicare Other | Source: Ambulatory Visit | Attending: Family Medicine | Admitting: Family Medicine

## 2012-11-09 ENCOUNTER — Encounter: Payer: Self-pay | Admitting: Internal Medicine

## 2012-11-09 DIAGNOSIS — Z1231 Encounter for screening mammogram for malignant neoplasm of breast: Secondary | ICD-10-CM | POA: Insufficient documentation

## 2012-11-09 DIAGNOSIS — Z139 Encounter for screening, unspecified: Secondary | ICD-10-CM

## 2012-11-17 ENCOUNTER — Ambulatory Visit: Payer: PRIVATE HEALTH INSURANCE | Admitting: Internal Medicine

## 2012-11-21 ENCOUNTER — Telehealth: Payer: Self-pay | Admitting: Internal Medicine

## 2012-11-21 NOTE — Telephone Encounter (Signed)
Paper chart requested.

## 2012-11-21 NOTE — Telephone Encounter (Signed)
She probably needs to be seen for the swelling. Try to get her in to see a mid-level provider this week or early next week if you can.  Dr. Debara Pickett

## 2012-11-21 NOTE — Telephone Encounter (Signed)
Returned call.  Pt c/o fluid in her feet and legs intermittently "for some time, but they getting worse" and wants Dr. Debara Pickett to call her in a fluid pill.  Stated left is worse than right.  Denied CP and SOB.  C/o soreness and tenderness to touch in L leg.  Stated the L leg might be a little bit warmer than the R leg, but not much more.  Had numbness in L leg over weekend.  None today.  Stated her ankle is about twice the size it should be and also c/o swelling in her hands.  Pt. Informed she may need to be seen vs calling in a rx and since she is seen in the [place] office, her last OV note is being retrieved (no chart in [place] office).  Pt. Informed Dr. Debara Pickett will be notified for further instructions and she will receive a call back.  Pt verbalized understanding and agreed w/ plan.

## 2012-11-21 NOTE — Telephone Encounter (Signed)
Call to pt and informed per Dr. Debara Pickett that she should be seen.  Pt stated "I'm not coming back up there to see him.  I just saw him three weeks ago and I don't have another $40 to come there."  Pt informed she would need to be seen in order to make any adjustments to her meds.  Pt advised to contact pcp if unable to be seen here.  Pt verbalized understanding.  No appt scheduled.

## 2012-11-21 NOTE — Telephone Encounter (Signed)
Pt called stating that her legs and feet are swollen and hurting would like for someone to call her back

## 2012-11-22 NOTE — Telephone Encounter (Signed)
Thanks .Marland Kitchen All we can do is offer to help.  -Dr Lemmie Evens

## 2012-11-30 ENCOUNTER — Other Ambulatory Visit: Payer: Self-pay | Admitting: Neurosurgery

## 2012-11-30 DIAGNOSIS — M542 Cervicalgia: Secondary | ICD-10-CM

## 2012-12-08 ENCOUNTER — Ambulatory Visit
Admission: RE | Admit: 2012-12-08 | Discharge: 2012-12-08 | Disposition: A | Discharge status: Home / Self Care | Payer: Medicare Other | Source: Ambulatory Visit | Attending: Neurosurgery | Admitting: Neurosurgery

## 2012-12-08 VITALS — BP 130/62 | HR 76

## 2012-12-08 DIAGNOSIS — M542 Cervicalgia: Secondary | ICD-10-CM

## 2012-12-08 MED ORDER — IOHEXOL 300 MG/ML  SOLN
1.0000 mL | Freq: Once | INTRAMUSCULAR | Status: AC | PRN
  Administered 2012-12-08: 1 mL via EPIDURAL

## 2012-12-08 MED ORDER — TRIAMCINOLONE ACETONIDE 40 MG/ML IJ SUSP (RADIOLOGY)
60.0000 mg | Freq: Once | INTRAMUSCULAR | Status: AC
  Administered 2012-12-08: 60 mg via EPIDURAL

## 2013-03-15 ENCOUNTER — Other Ambulatory Visit: Payer: Self-pay | Admitting: Neurosurgery

## 2013-03-15 DIAGNOSIS — M542 Cervicalgia: Secondary | ICD-10-CM

## 2013-03-21 ENCOUNTER — Encounter (HOSPITAL_COMMUNITY): Payer: Self-pay

## 2013-03-21 ENCOUNTER — Emergency Department (HOSPITAL_COMMUNITY)
Admission: EM | Admit: 2013-03-21 | Discharge: 2013-03-21 | Disposition: A | Discharge status: Home / Self Care | Payer: Medicare Other | Attending: Emergency Medicine | Admitting: Emergency Medicine

## 2013-03-21 DIAGNOSIS — Z8639 Personal history of other endocrine, nutritional and metabolic disease: Secondary | ICD-10-CM | POA: Insufficient documentation

## 2013-03-21 DIAGNOSIS — M79609 Pain in unspecified limb: Secondary | ICD-10-CM | POA: Insufficient documentation

## 2013-03-21 DIAGNOSIS — J449 Chronic obstructive pulmonary disease, unspecified: Secondary | ICD-10-CM | POA: Insufficient documentation

## 2013-03-21 DIAGNOSIS — Z862 Personal history of diseases of the blood and blood-forming organs and certain disorders involving the immune mechanism: Secondary | ICD-10-CM | POA: Insufficient documentation

## 2013-03-21 DIAGNOSIS — M79604 Pain in right leg: Secondary | ICD-10-CM

## 2013-03-21 DIAGNOSIS — Z79899 Other long term (current) drug therapy: Secondary | ICD-10-CM | POA: Insufficient documentation

## 2013-03-21 DIAGNOSIS — Z8739 Personal history of other diseases of the musculoskeletal system and connective tissue: Secondary | ICD-10-CM | POA: Insufficient documentation

## 2013-03-21 DIAGNOSIS — Z7982 Long term (current) use of aspirin: Secondary | ICD-10-CM | POA: Insufficient documentation

## 2013-03-21 DIAGNOSIS — I10 Essential (primary) hypertension: Secondary | ICD-10-CM | POA: Insufficient documentation

## 2013-03-21 DIAGNOSIS — F17200 Nicotine dependence, unspecified, uncomplicated: Secondary | ICD-10-CM | POA: Insufficient documentation

## 2013-03-21 DIAGNOSIS — I252 Old myocardial infarction: Secondary | ICD-10-CM | POA: Insufficient documentation

## 2013-03-21 DIAGNOSIS — I2510 Atherosclerotic heart disease of native coronary artery without angina pectoris: Secondary | ICD-10-CM | POA: Insufficient documentation

## 2013-03-21 DIAGNOSIS — Z87442 Personal history of urinary calculi: Secondary | ICD-10-CM | POA: Insufficient documentation

## 2013-03-21 DIAGNOSIS — Z7902 Long term (current) use of antithrombotics/antiplatelets: Secondary | ICD-10-CM | POA: Insufficient documentation

## 2013-03-21 DIAGNOSIS — J4489 Other specified chronic obstructive pulmonary disease: Secondary | ICD-10-CM | POA: Insufficient documentation

## 2013-03-21 DIAGNOSIS — K219 Gastro-esophageal reflux disease without esophagitis: Secondary | ICD-10-CM | POA: Insufficient documentation

## 2013-03-21 DIAGNOSIS — Z880 Allergy status to penicillin: Secondary | ICD-10-CM | POA: Insufficient documentation

## 2013-03-21 MED ORDER — ENOXAPARIN SODIUM 100 MG/ML ~~LOC~~ SOLN
1.0000 mg/kg | Freq: Once | SUBCUTANEOUS | Status: AC
  Administered 2013-03-21: 100 mg via SUBCUTANEOUS
  Filled 2013-03-21: qty 1

## 2013-03-21 NOTE — ED Notes (Signed)
Pt reports right leg swelling at the calf area for 1 week, was at urgent care and sent her for further eval. Denies any known injury.

## 2013-03-21 NOTE — ED Provider Notes (Addendum)
CSN: [id number]     Arrival date & time 03/21/13  1611 History  This chart was scribed for Kim Hamburger, MD by Maree Erie, ED Scribe. The patient was seen in room APA03/APA03. Patient's care was started at 5:29 PM.    Chief Complaint  Patient presents with  . Leg Swelling    The history is provided by the patient. No language interpreter was used.    HPI Comments: [name] is a 72 y.o. female who presents to the Emergency Department complaining of constant, gradually worsening right calf swelling that began five days ago. She complains of associated weakness in the right leg. The pain in her leg is currently an 8/10 in severity. She went to urgent care in [place] and states she was referred here due to suspicion of blood clot. She denies any redness in the area. She denies a history of blood clots or kidney probems. She has a history of MI in 2003 with stent placement and COPD. She is currently taking Plavix. She denies any chest pain or shortness of breath. She is no longer on estrogen.    Past Medical History  Diagnosis Date  . Dysphagia   . CAD (coronary artery disease)   . MI (myocardial infarction)     2003 with one stent placement  . Hypertension     2002  . Hypertriglyceridemia   . COPD (chronic obstructive pulmonary disease)   . GERD (gastroesophageal reflux disease)   . Kidney stone   . Carotid artery disease   . Cervical disc disease    Past Surgical History  Procedure Laterality Date  . Cholecystectomy    . Total abdominal hysterectomy w/ bilateral salpingoophorectomy    . Shoulder surgery      torn rotator cuff  . Cardiac catheterization      with stent 2004  CYPHER 2.75 x 83mm coronary stent in the mid left anterior descending stenotic lesion    Last stress test showed EF of 775  . Right knee arthroscopy  2010  . Balloon dilation  05/05/2011    Procedure: BALLOON DILATION;  Surgeon: Rogene Houston, MD;  Location: [facility];  Service: Endoscopy;   Laterality: N/A;  Venia Minks dilation  05/05/2011    Procedure: Venia Minks DILATION;  Surgeon: Rogene Houston, MD;  Location: [facility];  Service: Endoscopy;  Laterality: N/A;  . Savory dilation  05/05/2011    Procedure: SAVORY DILATION;  Surgeon: Rogene Houston, MD;  Location: [facility];  Service: Endoscopy;  Laterality: N/A;   Family History  Problem Relation Age of Onset  . Colon cancer Neg Hx   . Heart attack Father   . Heart disease Sister   . Heart disease Brother   . Hypertension Sister   . Diabetes Mother   . Heart failure Mother    History  Substance Use Topics  . Smoking status: Current Every Day Smoker -- 1.00 packs/day for 56 years    Types: Cigarettes  . Smokeless tobacco: Not on file  . Alcohol Use: 0.6 oz/week    1 Cans of beer per week     Comment: every 2-3 months   OB History   Grav Para Term Preterm Abortions TAB SAB Ect Mult Living                 Review of Systems  Constitutional: Negative for fever.  Respiratory: Negative for shortness of breath.   Cardiovascular: Positive for leg swelling (right  calf). Negative for chest pain.  Neurological: Negative for numbness.  All other systems reviewed and are negative.    Allergies  Penicillins; Lipitor; Ceclor; Codeine; and Sulfa antibiotics  Home Medications   Current Outpatient Rx  Name  Route  Sig  Dispense  Refill  . ADVAIR DISKUS 500-50 MCG/DOSE AEPB               . albuterol (PROVENTIL) (2.5 MG/3ML) 0.083% nebulizer solution   Nebulization   Take 2.5 mg by nebulization every 6 (six) hours as needed. For shortness of breath         . amLODipine (NORVASC) 10 MG tablet   Oral   Take 10 mg by mouth daily.           . Ascorbic Acid (VITAMIN C) 100 MG tablet   Oral   Take 100 mg by mouth daily.          Marland Kitchen aspirin 81 MG tablet   Oral   Take 81 mg by mouth daily.           . cholecalciferol (VITAMIN D) 1000 UNITS tablet   Oral   Take 1,000 Units by mouth daily.          . cholecalciferol (VITAMIN D) 400 UNITS TABS   Oral   Take 400 Units by mouth daily.           . clonazePAM (KLONOPIN) 0.5 MG tablet   Oral   Take 0.5 mg by mouth at bedtime as needed. For sleep         . clonazePAM (KLONOPIN) 0.5 MG tablet   Oral   Take 0.5 mg by mouth 2 (two) times daily as needed.           . clopidogrel (PLAVIX) 75 MG tablet   Oral   Take 75 mg by mouth daily.           Marland Kitchen estrogens, conjugated, (PREMARIN) 1.25 MG tablet   Oral   Take 1.25 mg by mouth daily as needed. For hot flashes         . estrogens, conjugated, (PREMARIN) 1.25 MG tablet   Oral   Take 1.25 mg by mouth daily. Take one tablet three times a week         . furosemide (LASIX) 20 MG tablet               . isosorbide mononitrate (IMDUR) 60 MG 24 hr tablet   Oral   Take 60 mg by mouth daily.           Marland Kitchen lisinopril-hydrochlorothiazide (PRINZIDE,ZESTORETIC) 20-12.5 MG per tablet   Oral   Take 1 tablet by mouth daily.           . meloxicam (MOBIC) 15 MG tablet   Oral   Take 15 mg by mouth daily.         . metoprolol succinate (TOPROL-XL) 25 MG 24 hr tablet   Oral   Take 25 mg by mouth 2 (two) times daily before a meal.           . metoprolol tartrate (LOPRESSOR) 25 MG tablet   Oral   Take 25 mg by mouth 2 (two) times daily.           . metoprolol tartrate (LOPRESSOR) 25 MG tablet   Oral   Take 25 mg by mouth 2 (two) times daily.         Marland Kitchen omega-3 acid ethyl esters (LOVAZA) 1  G capsule   Oral   Take 2 g by mouth 2 (two) times daily.           Marland Kitchen EXPIRED: pantoprazole (PROTONIX) 40 MG tablet   Oral   Take 1 tablet (40 mg total) by mouth daily.   30 tablet   5   . promethazine (PHENERGAN) 25 MG tablet   Oral   Take 25 mg by mouth every 6 (six) hours as needed. For nausea          Triage Vitals: BP 114/45  Pulse 95  Temp(Src) 98.1 F (36.7 C) (Oral)  Resp 22  Ht 5\' 7"  (1.702 m)  Wt 188 lb (85.276 kg)  BMI 29.44 kg/m2  SpO2  92%  Physical Exam  Nursing note and vitals reviewed. Constitutional: She is oriented to person, place, and time. She appears well-developed and well-nourished. No distress.  HENT:  Head: Normocephalic and atraumatic.  Eyes: EOM are normal.  Neck: Neck supple. No tracheal deviation present.  Cardiovascular: Normal rate, regular rhythm, normal heart sounds and intact distal pulses.   Pulses:      Dorsalis pedis pulses are 2+ on the right side, and 2+ on the left side.  Positive Homans sign on right lower extremity. 2+ DP bilaterally.   Pulmonary/Chest: Effort normal. No respiratory distress. She has wheezes (faint, diffuse).  Musculoskeletal: Normal range of motion. She exhibits tenderness (right posterior calf).       Right lower leg: She exhibits tenderness. She exhibits no bony tenderness.  Neurological: She is alert and oriented to person, place, and time. No sensory deficit.  5/5 strength in lower extremities.   Skin: Skin is warm and dry.  Psychiatric: She has a normal mood and affect. Her behavior is normal.    ED Course  Procedures (including critical care time)   COORDINATION OF CARE:  5:43 PM - Patient verbalizes understanding and agrees with treatment plan.   Labs Review Labs Reviewed - No data to display Imaging Review No results found.  MDM   1. Right leg pain    Her symptoms are most consistent with an acute DVT. However she arrived after hours we do not have emergent ultrasound available. She has an ultrasound scheduled for tomorrow at 3:15 PM. I discussed the case with her PCPs colleagues Dr. Cindie Laroche who states he will pass this patient on to Dr. Karie Kirks and they will followup the ultrasound results. I will give her one dose of Lovenox subcutaneous here. There is no chest or cardiac symptoms that would be concerning for a PE. She does have some mild wheezing but denies wanting an inhaler here. This is likely causing her borderline low sats. Patient is able to  walk normally and has no abnormal neurologic findings on exam.  I personally performed the services described in this documentation, which was scribed in my presence. The recorded information has been reviewed and is accurate.     Kim Hamburger, MD 03/21/13 2001  Kim Hamburger, MD 03/21/13 2002

## 2013-03-21 NOTE — ED Notes (Signed)
Discharge instructions given and reviewed with patient.  Patient verbalized understanding to return tomorrow at 315pm for ultrasound of leg.  Patient refused vital signs at discharge.

## 2013-03-22 ENCOUNTER — Ambulatory Visit (HOSPITAL_COMMUNITY)
Admission: RE | Admit: 2013-03-22 | Discharge: 2013-03-22 | Disposition: A | Discharge status: Home / Self Care | Payer: Medicare Other | Source: Ambulatory Visit | Attending: Emergency Medicine | Admitting: Emergency Medicine

## 2013-03-22 ENCOUNTER — Ambulatory Visit (HOSPITAL_COMMUNITY): Admit: 2013-03-22 | Payer: Medicare Other

## 2013-03-22 DIAGNOSIS — M79609 Pain in unspecified limb: Secondary | ICD-10-CM | POA: Insufficient documentation

## 2013-03-22 DIAGNOSIS — M7989 Other specified soft tissue disorders: Secondary | ICD-10-CM | POA: Insufficient documentation

## 2013-03-26 ENCOUNTER — Ambulatory Visit (INDEPENDENT_AMBULATORY_CARE_PROVIDER_SITE_OTHER): Payer: Medicare Other | Admitting: Cardiovascular Disease

## 2013-03-26 ENCOUNTER — Encounter: Payer: Self-pay | Admitting: Cardiovascular Disease

## 2013-03-26 VITALS — BP 110/52 | HR 83 | Ht 67.0 in | Wt 188.8 lb

## 2013-03-26 DIAGNOSIS — R0602 Shortness of breath: Secondary | ICD-10-CM

## 2013-03-26 DIAGNOSIS — R5383 Other fatigue: Secondary | ICD-10-CM

## 2013-03-26 DIAGNOSIS — F17200 Nicotine dependence, unspecified, uncomplicated: Secondary | ICD-10-CM

## 2013-03-26 DIAGNOSIS — I2510 Atherosclerotic heart disease of native coronary artery without angina pectoris: Secondary | ICD-10-CM

## 2013-03-26 DIAGNOSIS — I480 Paroxysmal atrial fibrillation: Secondary | ICD-10-CM | POA: Insufficient documentation

## 2013-03-26 DIAGNOSIS — E785 Hyperlipidemia, unspecified: Secondary | ICD-10-CM

## 2013-03-26 DIAGNOSIS — I4891 Unspecified atrial fibrillation: Secondary | ICD-10-CM

## 2013-03-26 DIAGNOSIS — R0789 Other chest pain: Secondary | ICD-10-CM

## 2013-03-26 DIAGNOSIS — R5381 Other malaise: Secondary | ICD-10-CM

## 2013-03-26 MED ORDER — FUROSEMIDE 20 MG PO TABS: 20.0000 mg | ORAL_TABLET | Freq: Two times a day (BID) | ORAL | Status: DC | PRN

## 2013-03-26 MED ORDER — POTASSIUM CHLORIDE ER 10 MEQ PO TBCR: 10.0000 meq | EXTENDED_RELEASE_TABLET | Freq: Every day | ORAL | Status: DC

## 2013-03-26 MED ORDER — POTASSIUM CHLORIDE ER 10 MEQ PO TBCR: 10.0000 meq | EXTENDED_RELEASE_TABLET | Freq: Two times a day (BID) | ORAL | Status: DC | PRN

## 2013-03-26 MED ORDER — APIXABAN 5 MG PO TABS: 5.0000 mg | ORAL_TABLET | Freq: Two times a day (BID) | ORAL | Status: DC

## 2013-03-26 NOTE — Assessment & Plan Note (Signed)
We have encouraged her to continue to work on weaning her cigarettes and smoking cessation. She will continue to work on this and does not want any assistance with chantix.  

## 2013-03-26 NOTE — Assessment & Plan Note (Signed)
History of non-tolerance to Lipitor and pravastatin. We will discuss this with her on her next visit

## 2013-03-26 NOTE — Assessment & Plan Note (Addendum)
I suspect she has converted to atrial fibrillation over the past month or so given new onset of symptoms of shortness of breath, leg edema, fatigue. Rate is recently well controlled on metoprolol twice a day. We have suggested she start Lasix 20 mg daily with potassium, extra Lasix as needed for worsening edema. We have instructed her to limit her fluid intake. We will start anticoagulation with eliquis. She does not want warfarin. The plan will be for meeting again in one months time after she has been on anticoagulation and consider cardioversion.

## 2013-03-26 NOTE — Assessment & Plan Note (Signed)
I suspect her fatigue is secondary to new atrial fibrillation. After one month of anticoagulation, could try to restore normal sinus rhythm

## 2013-03-26 NOTE — Patient Instructions (Addendum)
Please restart lasix one a day with potassium (ok to take lasix twice a day with potassium for leg swelling) Please hold  plavix Start eliquis one pill twice a day Continue aspirin 81 mg daily  Cut the amlodipine in 1/2 daily  Please call us if you have new issues that need to be addressed before your next appt.  Your physician wants you to follow-up in: 1 month.

## 2013-03-26 NOTE — Progress Notes (Signed)
Patient ID: [name], female    DOB: June 11, 1941, 72 y.o.   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  She reports that she has had worsening lower extremity edema over the past several weeks, shortness of breath, dramatic weight gain. She has felt a tightness in her breathing and bra feels tight over the past several weeks. Energy is poor, she's not felt like doing anything. She has discomfort in her legs secondary to the swelling. Notes indicate lower extremity Doppler showed no DVT recently. Seen by primary care and started on Lasix for 10 days which improved her symptoms somewhat, now worse again.  She does report having chest pain symptoms last year. She had a stress test which was reportedly normal. She continues to have occasional chest discomfort, rare, not typically with exertion.  EKG shows atrial fibrillation, ventricular rate 83 beats per minute, rare PVC   Outpatient Encounter Prescriptions as of 03/26/2013  Medication Sig Dispense Refill  . ADVAIR DISKUS 500-50 MCG/DOSE AEPB 1 puff 2 (two) times daily.       Marland Kitchen albuterol (PROVENTIL) (2.5 MG/3ML) 0.083% nebulizer solution Take 2.5 mg by nebulization every 6 (six) hours as needed. For shortness of breath      . amLODipine (NORVASC) 10 MG tablet Take 10 mg by mouth daily.        . Ascorbic Acid (VITAMIN C) 100 MG tablet Take 100 mg by mouth daily.       Marland Kitchen aspirin 81 MG tablet Take 81 mg by mouth daily.        . cholecalciferol (VITAMIN D) 1000 UNITS tablet Take 1,000 Units by mouth daily.      . clonazePAM (KLONOPIN) 0.5 MG tablet Take 0.5 mg by mouth at bedtime as needed. For sleep       . clopidogrel (PLAVIX) 75 MG tablet Take 75 mg by mouth daily.        . isosorbide mononitrate (IMDUR) 60 MG 24 hr tablet Take 60 mg by mouth daily.        Marland Kitchen lisinopril-hydrochlorothiazide (PRINZIDE,ZESTORETIC) 20-25 MG per tablet Take 1 tablet by mouth daily.      . meloxicam (MOBIC) 15 MG tablet Take 15 mg by mouth daily.      . metoprolol succinate (TOPROL-XL) 25 MG 24 hr tablet Take 25 mg by mouth 2 (two) times daily before a meal.        . omega-3 acid ethyl esters (LOVAZA) 1 G capsule Take 2 g by mouth 2 (two) times daily.        . pantoprazole (PROTONIX) 40 MG tablet Take 1 tablet (40 mg total) by mouth daily.  30 tablet  5  . promethazine (PHENERGAN) 25 MG tablet Take 25 mg by mouth every 6 (six) hours as needed. For nausea        Review of Systems  Constitutional: Positive for unexpected weight change.  HENT: Negative.   Eyes: Negative.   Respiratory: Positive for shortness of breath.   Cardiovascular: Positive for leg swelling.  Gastrointestinal: Negative.   Endocrine: Negative.   Musculoskeletal: Negative.   Skin: Negative.   Allergic/Immunologic: Negative.  Neurological: Negative.   Hematological: Negative.   Psychiatric/Behavioral: Negative.   All other systems reviewed and are negative.    BP 110/52  Pulse 83  Ht 5\' 7"  (1.702 m)  Wt 188 lb 12 oz (85.616 kg)  BMI 29.56 kg/m2  Physical Exam  Nursing note and vitals reviewed. Constitutional: She is oriented to person, place, and time. She appears well-developed and well-nourished.  HENT:  Head: Normocephalic.  Nose: Nose normal.  Mouth/Throat: Oropharynx is clear and moist.  Eyes: Conjunctivae are normal. Pupils are equal, round, and reactive to light.  Neck: Normal range of motion. Neck supple. No JVD present.  Cardiovascular: Normal rate, regular rhythm, S1 normal, S2 normal, normal heart sounds and intact distal pulses.  Exam reveals no gallop and no friction rub.   No murmur heard. Pulmonary/Chest:  Effort normal and breath sounds normal. No respiratory distress. She has no wheezes. She has no rales. She exhibits no tenderness.  Abdominal: Soft. Bowel sounds are normal. She exhibits no distension. There is no tenderness.  Musculoskeletal: Normal range of motion. She exhibits no edema and no tenderness.  Lymphadenopathy:    She has no cervical adenopathy.  Neurological: She is alert and oriented to person, place, and time. Coordination normal.  Skin: Skin is warm and dry. No rash noted. No erythema.  Psychiatric: She has a normal mood and affect. Her behavior is normal. Judgment and thought content normal.    Assessment and Plan

## 2013-03-26 NOTE — Assessment & Plan Note (Signed)
Rare chest pain, recent negative stress test July 2013. Would order cardiac catheterization if symptoms get worse

## 2013-03-26 NOTE — Assessment & Plan Note (Signed)
Currently with no symptoms of angina. No further workup at this time. Continue current medication regimen. She does report occasional chest discomfort. We have offered her cardiac catheterization if symptoms get worse.

## 2013-03-26 NOTE — Assessment & Plan Note (Signed)
Shortness of breath and signs of diastolic CHF likely from underlying atrial fibrillation. Will start Lasix again, once or twice a day for symptom relief. Weight is up more than 10 pounds from baseline

## 2013-03-27 ENCOUNTER — Other Ambulatory Visit: Payer: Medicare Other

## 2013-03-28 ENCOUNTER — Ambulatory Visit
Admission: RE | Admit: 2013-03-28 | Discharge: 2013-03-28 | Disposition: A | Discharge status: Home / Self Care | Payer: Medicare Other | Source: Ambulatory Visit | Attending: Neurosurgery | Admitting: Neurosurgery

## 2013-03-28 VITALS — BP 108/56 | HR 68

## 2013-03-28 DIAGNOSIS — M5126 Other intervertebral disc displacement, lumbar region: Secondary | ICD-10-CM

## 2013-03-28 DIAGNOSIS — M542 Cervicalgia: Secondary | ICD-10-CM

## 2013-03-28 MED ORDER — IOHEXOL 300 MG/ML  SOLN
1.0000 mL | Freq: Once | INTRAMUSCULAR | Status: AC | PRN
  Administered 2013-03-28: 1 mL via EPIDURAL

## 2013-03-28 MED ORDER — TRIAMCINOLONE ACETONIDE 40 MG/ML IJ SUSP (RADIOLOGY)
60.0000 mg | Freq: Once | INTRAMUSCULAR | Status: AC
  Administered 2013-03-28: 60 mg via EPIDURAL

## 2013-03-30 ENCOUNTER — Other Ambulatory Visit: Payer: Self-pay

## 2013-03-30 MED ORDER — AMLODIPINE BESYLATE 5 MG PO TABS: 5.0000 mg | ORAL_TABLET | Freq: Every day | ORAL | Status: DC

## 2013-03-30 NOTE — Telephone Encounter (Signed)
Pharmacist states she needs 5 mg refill. Please call

## 2013-03-30 NOTE — Telephone Encounter (Signed)
Refilled Amlodipine 5 mg sent to [facility].

## 2013-04-04 ENCOUNTER — Telehealth: Payer: Self-pay

## 2013-04-04 NOTE — Telephone Encounter (Signed)
Spoke w/ pt.  She states that since starting Eliquis 9/22, she has had increasing SOB and new onset pain in her kidneys. Denies dysuria or frequent urination.  Instructed pt to hold Eliquis until she hears back from me with instructions from Dr. Rockey Situ. Pt states that she will be out of town tomorrow and is considering seeing a kidney specialist b/c she has "never had kidney problems before". Please advise.  Thank you.

## 2013-04-05 NOTE — Telephone Encounter (Signed)
Suspect SOB is from atrial fibrillation Uncertain what the pain she is having could be from, kidney pain  She could try xarelto or pradaxa or warfarin for anticoagulation

## 2013-04-06 NOTE — Telephone Encounter (Signed)
Spoke w/ pt.  States that she is "allergic to the world & every medicine". She adamently refuses to go on warfarin, as it "killed her sister and ex-husband". She does not have a preference as to xarelto or pradaxa, but would like whichever she "would be the least allergic to". Please advise.  Thank you.

## 2013-04-09 NOTE — Telephone Encounter (Signed)
Would start pradaxa 150 mg po BID  Come in for samples Usually well tolerated

## 2013-04-09 NOTE — Telephone Encounter (Signed)
Pt would like a response today.

## 2013-04-09 NOTE — Telephone Encounter (Signed)
Spoke w/ pt.  She will pick up samples tomorrow of Pradaxa 150mg .

## 2013-04-17 ENCOUNTER — Other Ambulatory Visit (HOSPITAL_COMMUNITY): Payer: Self-pay | Admitting: Family Medicine

## 2013-04-17 DIAGNOSIS — I639 Cerebral infarction, unspecified: Secondary | ICD-10-CM

## 2013-04-19 ENCOUNTER — Ambulatory Visit (HOSPITAL_COMMUNITY)
Admission: RE | Admit: 2013-04-19 | Discharge: 2013-04-19 | Disposition: A | Discharge status: Home / Self Care | Payer: Medicare Other | Source: Ambulatory Visit | Attending: Family Medicine | Admitting: Family Medicine

## 2013-04-19 DIAGNOSIS — R109 Unspecified abdominal pain: Secondary | ICD-10-CM | POA: Insufficient documentation

## 2013-04-19 DIAGNOSIS — I639 Cerebral infarction, unspecified: Secondary | ICD-10-CM

## 2013-05-01 ENCOUNTER — Ambulatory Visit (INDEPENDENT_AMBULATORY_CARE_PROVIDER_SITE_OTHER): Payer: Medicare Other | Admitting: Cardiovascular Disease

## 2013-05-01 ENCOUNTER — Encounter (HOSPITAL_COMMUNITY): Payer: Self-pay | Admitting: Pharmacy Technician

## 2013-05-01 ENCOUNTER — Encounter: Payer: Self-pay | Admitting: Cardiovascular Disease

## 2013-05-01 VITALS — BP 104/54 | HR 78 | Ht 67.0 in | Wt 183.5 lb

## 2013-05-01 DIAGNOSIS — I2510 Atherosclerotic heart disease of native coronary artery without angina pectoris: Secondary | ICD-10-CM

## 2013-05-01 DIAGNOSIS — F17200 Nicotine dependence, unspecified, uncomplicated: Secondary | ICD-10-CM

## 2013-05-01 DIAGNOSIS — R0789 Other chest pain: Secondary | ICD-10-CM

## 2013-05-01 DIAGNOSIS — I4891 Unspecified atrial fibrillation: Secondary | ICD-10-CM

## 2013-05-01 DIAGNOSIS — R0602 Shortness of breath: Secondary | ICD-10-CM

## 2013-05-01 MED ORDER — DABIGATRAN ETEXILATE MESYLATE 150 MG PO CAPS: 150.0000 mg | ORAL_CAPSULE | Freq: Two times a day (BID) | ORAL | Status: DC

## 2013-05-01 MED ORDER — AMIODARONE HCL 200 MG PO TABS: 200.0000 mg | ORAL_TABLET | Freq: Two times a day (BID) | ORAL | Status: DC

## 2013-05-01 NOTE — Progress Notes (Signed)
Patient ID: [name], female    DOB: [birth year], 72 y.o.   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 On her last clinic visit one month ago, she had CHF symptoms, found to be in atrial fibrillation. She was started on anticoagulation, given Lasix.  In followup today, she feels better. Still some palpitations at times. Blood pressure has been running low. She is interested in getting out of atrial fibrillation.   Energy is poor, she's not felt like doing anything. She has been tolerating pradaxa twice a day with no complaints.  She does report having chest pain symptoms last year. She had a stress test which was reportedly normal.  She continues to smoke  EKG shows atrial fibrillation, ventricular rate 78 beats per minute. Rare PVC   Outpatient Encounter Prescriptions as of 05/01/2013  Medication Sig Dispense Refill  . Acetaminophen (TYLENOL PO) Take by mouth as needed.      Marland Kitchen ADVAIR DISKUS 500-50 MCG/DOSE AEPB 1 puff 2 (two) times daily.       Marland Kitchen albuterol (PROVENTIL) (2.5 MG/3ML) 0.083% nebulizer solution Take 2.5 mg by nebulization every 6 (six) hours as needed. For shortness of breath      . amLODipine (NORVASC) 5 MG tablet Take 1 tablet (5 mg total) by mouth daily.  30 tablet  3  . Ascorbic Acid (VITAMIN C) 100 MG tablet Take 100 mg by mouth daily.       Marland Kitchen aspirin 81 MG tablet Take 81 mg by mouth daily.        . cholecalciferol (VITAMIN D) 1000 UNITS tablet Take 1,000 Units by mouth daily.      . clonazePAM (KLONOPIN) 0.5 MG tablet Take 0.5 mg by mouth at bedtime as needed. For sleep      . dabigatran (PRADAXA) 150 MG CAPS capsule Take  150 mg by mouth every 12 (twelve) hours.      . furosemide (LASIX) 20 MG tablet Take 1 tablet (20 mg total) by mouth 2 (two) times daily as needed.  60 tablet  6  . isosorbide mononitrate (IMDUR) 60 MG 24 hr tablet Take 60 mg by mouth daily.        Marland Kitchen lisinopril-hydrochlorothiazide (PRINZIDE,ZESTORETIC) 20-25 MG per tablet Take 1 tablet by mouth daily.      . meloxicam (MOBIC) 15 MG tablet Take 15 mg by mouth daily.      . metoprolol succinate (TOPROL-XL) 25 MG 24 hr tablet Take 25 mg by mouth 2 (two) times daily before a meal.        . omega-3 acid ethyl esters (LOVAZA) 1 G capsule Take 2 g by mouth 2 (two) times daily.        . potassium chloride (K-DUR) 10 MEQ tablet Take 1 tablet (10 mEq total) by mouth 2 (two) times daily as needed.  60 tablet  6  . promethazine (PHENERGAN) 25 MG tablet Take 25 mg by mouth every 6 (six) hours as needed. For nausea      . pantoprazole (PROTONIX) 40 MG tablet Take 1 tablet (40 mg total) by mouth daily.  30 tablet  5  Review of Systems  HENT: Negative.   Eyes: Negative.   Respiratory: Positive for shortness of breath.   Gastrointestinal: Negative.   Endocrine: Negative.   Musculoskeletal: Negative.   Skin: Negative.   Allergic/Immunologic: Negative.   Neurological: Negative.   Hematological: Negative.   Psychiatric/Behavioral: Negative.   All other systems reviewed and are negative.    BP 104/54  Pulse 78  Ht 5\' 7"  (1.702 m)  Wt 183 lb 8 oz (83.235 kg)  BMI 28.73 kg/m2  Physical Exam  Nursing note and vitals reviewed. Constitutional: She is oriented to person, place, and time. She appears well-developed and well-nourished.  HENT:  Head: Normocephalic.  Nose: Nose normal.  Mouth/Throat: Oropharynx is clear and moist.  Eyes: Conjunctivae are normal. Pupils are equal, round, and reactive to light.  Neck: Normal range of motion. Neck supple. No JVD present.  Cardiovascular: Normal rate, regular rhythm, S1 normal, S2 normal, normal heart  sounds and intact distal pulses.  Exam reveals no gallop and no friction rub.   No murmur heard. Pulmonary/Chest: Effort normal and breath sounds normal. No respiratory distress. She has no wheezes. She has no rales. She exhibits no tenderness.  Abdominal: Soft. Bowel sounds are normal. She exhibits no distension. There is no tenderness.  Musculoskeletal: Normal range of motion. She exhibits no edema and no tenderness.  Lymphadenopathy:    She has no cervical adenopathy.  Neurological: She is alert and oriented to person, place, and time. Coordination normal.  Skin: Skin is warm and dry. No rash noted. No erythema.  Psychiatric: She has a normal mood and affect. Her behavior is normal. Judgment and thought content normal.    Assessment and Plan

## 2013-05-01 NOTE — Assessment & Plan Note (Signed)
We have encouraged her to continue to work on weaning her cigarettes and smoking cessation. She will continue to work on this and does not want any assistance with chantix.  

## 2013-05-01 NOTE — Patient Instructions (Signed)
You are doing well. You are still in atrial fibrillation. Please start amiodarone 400 mg twice a day for 5 days Then on Monday, decrease the dose down to 200 mg twice a day  Hold the amlodipine, your blood pressure is low  Please call us if you have new issues that need to be addressed before your next appt.  Your physician wants you to follow-up in: 1 month Please come in for an ekg in 2 weeks If you are still in atrial fibrillation, we will schedule a cardioversion

## 2013-05-01 NOTE — Assessment & Plan Note (Signed)
Shortness breath likely from underlying COPD, continued smoking, underlying atrial fibrillation. Improved edema on Lasix

## 2013-05-01 NOTE — Assessment & Plan Note (Signed)
Currently with no symptoms of angina. No further workup at this time. Continue current medication regimen. 

## 2013-05-01 NOTE — Assessment & Plan Note (Signed)
She has been on anticoagulation for one month. Continues to be in atrial fibrillation. After long discussion of her various options, she prefers to try to restore normal sinus rhythm . She does have fatigue and shortness of breath . We will start her on amiodarone 400 mg twice a day for 5 days then down to 200 mg twice a day with EKG in 10 days to 2 weeks time. If she continues to be in normal sinus rhythm, we will schedule a cardioversion.

## 2013-05-09 NOTE — Patient Instructions (Signed)
Your procedure is scheduled on:  05/14/2013  Report to [facility] at   6:30   AM.  Call this number if you have problems the morning of surgery: [phone]   Remember:   Do not eat or drink :After Midnight.    Take these medicines the morning of surgery with A SIP OF WATER: Amiodarone, pradaxa, Imdur, Lisinopril, metoprolol, pantoprazole and use inhalers albuterol and Advair   Do not wear jewelry, make-up or nail polish.  Do not wear lotions, powders, or perfumes. You may wear deodorant.  Do not shave 48 hours prior to surgery.  Do not bring valuables to the hospital.  Contacts, dentures or bridgework may not be worn into surgery.  Patients discharged the day of surgery will not be allowed to drive home.  Name and phone number of your driver:    Please read over the following fact sheets that you were given: Pain Booklet, Surgical Site Infection Prevention, Anesthesia Post-op Instructions and Care and Recovery After Surgery  Cataract Surgery  A cataract is a clouding of the lens of the eye. When a lens becomes cloudy, vision is reduced based on the degree and nature of the clouding. Surgery may be needed to improve vision. Surgery removes the cloudy lens and usually replaces it with a substitute lens (intraocular lens, IOL). LET YOUR EYE DOCTOR KNOW ABOUT:  Allergies to food or medicine.   Medicines taken including herbs, eyedrops, over-the-counter medicines, and creams.   Use of steroids (by mouth or creams).   Previous problems with anesthetics or numbing medicine.   History of bleeding problems or blood clots.   Previous surgery.   Other health problems, including diabetes and kidney problems.   Possibility of pregnancy, if this applies.  RISKS AND COMPLICATIONS  Infection.   Inflammation of the eyeball (endophthalmitis) that can spread to both eyes (sympathetic ophthalmia).   Poor wound healing.   If an IOL is inserted, it can later fall out of proper position.  This is very uncommon.   Clouding of the part of your eye that holds an IOL in place. This is called an "after-cataract." These are uncommon, but easily treated.  BEFORE THE PROCEDURE  Do not eat or drink anything except small amounts of water for 8 to 12 before your surgery, or as directed by your caregiver.   Unless you are told otherwise, continue any eyedrops you have been prescribed.   Talk to your primary caregiver about all other medicines that you take (both prescription and non-prescription). In some cases, you may need to stop or change medicines near the time of your surgery. This is most important if you are taking blood-thinning medicine.Do not stop medicines unless you are told to do so.   Arrange for someone to drive you to and from the procedure.   Do not put contact lenses in either eye on the day of your surgery.  PROCEDURE There is more than one method for safely removing a cataract. Your doctor can explain the differences and help determine which is best for you. Phacoemulsification surgery is the most common form of cataract surgery.  An injection is given behind the eye or eyedrops are given to make this a painless procedure.   A small cut (incision) is made on the edge of the clear, dome-shaped surface that covers the front of the eye (cornea).   A tiny probe is painlessly inserted into the eye. This device gives off ultrasound waves that soften and break up  the cloudy center of the lens. This makes it easier for the cloudy lens to be removed by suction.   An IOL may be implanted.   The normal lens of the eye is covered by a clear capsule. Part of that capsule is intentionally left in the eye to support the IOL.   Your surgeon may or may not use stitches to close the incision.  There are other forms of cataract surgery that require a larger incision and stiches to close the eye. This approach is taken in cases where the doctor feels that the cataract cannot be  easily removed using phacoemulsification. AFTER THE PROCEDURE  When an IOL is implanted, it does not need care. It becomes a permanent part of your eye and cannot be seen or felt.   Your doctor will schedule follow-up exams to check on your progress.   Review your other medicines with your doctor to see which can be resumed after surgery.   Use eyedrops or take medicine as prescribed by your doctor.  Document Released: 06/10/2011 Document Reviewed: 06/07/2011 [facility] Patient Information 2012 [place].  .Cataract Surgery Care After Refer to this sheet in the next few weeks. These instructions provide you with information on caring for yourself after your procedure. Your caregiver may also give you more specific instructions. Your treatment has been planned according to current medical practices, but problems sometimes occur. Call your caregiver if you have any problems or questions after your procedure.  HOME CARE INSTRUCTIONS   Avoid strenuous activities as directed by your caregiver.   Ask your caregiver when you can resume driving.   Use eyedrops or other medicines to help healing and control pressure inside your eye as directed by your caregiver.   Only take over-the-counter or prescription medicines for pain, discomfort, or fever as directed by your caregiver.   Do not to touch or rub your eyes.   You may be instructed to use a protective shield during the first few days and nights after surgery. If not, wear sunglasses to protect your eyes. This is to protect the eye from pressure or from being accidentally bumped.   Keep the area around your eye clean and dry. Avoid swimming or allowing water to hit you directly in the face while showering. Keep soap and shampoo out of your eyes.   Do not bend or lift heavy objects. Bending increases pressure in the eye. You can walk, climb stairs, and do light household chores.   Do not put a contact lens into the eye that had surgery  until your caregiver says it is okay to do so.   Ask your doctor when you can return to work. This will depend on the kind of work that you do. If you work in a dusty environment, you may be advised to wear protective eyewear for a period of time.   Ask your caregiver when it will be safe to engage in sexual activity.   Continue with your regular eye exams as directed by your caregiver.  What to expect:  It is normal to feel itching and mild discomfort for a few days after cataract surgery. Some fluid discharge is also common, and your eye may be sensitive to light and touch.   After 1 to 2 days, even moderate discomfort should disappear. In most cases, healing will take about 6 weeks.   If you received an intraocular lens (IOL), you may notice that colors are very bright or have a blue tinge.  Also, if you have been in bright sunlight, everything may appear reddish for a few hours. If you see these color tinges, it is because your lens is clear and no longer cloudy. Within a few months after receiving an IOL, these extra colors should go away. When you have healed, you will probably need new glasses.  SEEK MEDICAL CARE IF:   You have increased bruising around your eye.   You have discomfort not helped by medicine.  SEEK IMMEDIATE MEDICAL CARE IF:   You have a fever.   You have a worsening or sudden vision loss.   You have redness, swelling, or increasing pain in the eye.   You have a thick discharge from the eye that had surgery.  MAKE SURE YOU:  Understand these instructions.   Will watch your condition.   Will get help right away if you are not doing well or get worse.  Document Released: 01/08/2005 Document Revised: 06/10/2011 Document Reviewed: 02/12/2011 [facility] Patient Information 2012 [place].

## 2013-05-10 ENCOUNTER — Encounter (HOSPITAL_COMMUNITY): Payer: Self-pay

## 2013-05-10 ENCOUNTER — Encounter (HOSPITAL_COMMUNITY)
Admission: RE | Admit: 2013-05-10 | Discharge: 2013-05-10 | Disposition: A | Discharge status: Home / Self Care | Payer: Medicare Other | Source: Ambulatory Visit | Attending: Ophthalmology | Admitting: Ophthalmology

## 2013-05-10 DIAGNOSIS — Z01812 Encounter for preprocedural laboratory examination: Secondary | ICD-10-CM | POA: Insufficient documentation

## 2013-05-10 HISTORY — DX: Other specified postprocedural states: Z98.890

## 2013-05-10 HISTORY — DX: Nausea with vomiting, unspecified: R11.2

## 2013-05-10 HISTORY — DX: Personal history of urinary calculi: Z87.442

## 2013-05-10 LAB — BASIC METABOLIC PANEL
BUN: 12 mg/dL (ref 6–23)
Calcium: 9.2 mg/dL (ref 8.4–10.5)
Creatinine, Ser: 1.28 mg/dL — ABNORMAL HIGH (ref 0.50–1.10)
GFR calc Af Amer: 47 mL/min — ABNORMAL LOW (ref >90)
GFR calc non Af Amer: 41 mL/min — ABNORMAL LOW (ref >90)

## 2013-05-10 LAB — HEMOGLOBIN AND HEMATOCRIT, BLOOD: Hemoglobin: 11.7 g/dL — ABNORMAL LOW (ref 12.0–15.0)

## 2013-05-10 NOTE — Pre-Procedure Instructions (Signed)
Dr Patsey Berthold aware of cardiac history. No orders given.

## 2013-05-11 MED ORDER — LIDOCAINE HCL 3.5 % OP GEL
OPHTHALMIC | Status: AC
  Filled 2013-05-11: qty 1

## 2013-05-11 MED ORDER — CYCLOPENTOLATE-PHENYLEPHRINE OP SOLN OPTIME - NO CHARGE
OPHTHALMIC | Status: AC
  Filled 2013-05-11: qty 2

## 2013-05-11 MED ORDER — NEOMYCIN-POLYMYXIN-DEXAMETH 3.5-10000-0.1 OP SUSP
OPHTHALMIC | Status: AC
  Filled 2013-05-11: qty 5

## 2013-05-11 MED ORDER — PHENYLEPHRINE HCL 2.5 % OP SOLN
OPHTHALMIC | Status: AC
  Filled 2013-05-11: qty 15

## 2013-05-11 MED ORDER — LIDOCAINE HCL (PF) 1 % IJ SOLN
INTRAMUSCULAR | Status: AC
  Filled 2013-05-11: qty 2

## 2013-05-11 MED ORDER — TETRACAINE HCL 0.5 % OP SOLN
OPHTHALMIC | Status: AC
  Filled 2013-05-11: qty 2

## 2013-05-14 ENCOUNTER — Ambulatory Visit (HOSPITAL_COMMUNITY)
Admission: RE | Admit: 2013-05-14 | Discharge: 2013-05-14 | Disposition: A | Discharge status: Home / Self Care | Payer: Medicare Other | Source: Ambulatory Visit | Attending: Ophthalmology | Admitting: Ophthalmology

## 2013-05-14 ENCOUNTER — Encounter (HOSPITAL_COMMUNITY)
Admission: RE | Disposition: A | Discharge status: Home / Self Care | Payer: Self-pay | Source: Ambulatory Visit | Attending: Ophthalmology

## 2013-05-14 ENCOUNTER — Ambulatory Visit (HOSPITAL_COMMUNITY): Payer: Medicare Other | Admitting: Anesthesiology

## 2013-05-14 ENCOUNTER — Encounter (HOSPITAL_COMMUNITY): Payer: Self-pay | Admitting: *Deleted

## 2013-05-14 ENCOUNTER — Encounter (HOSPITAL_COMMUNITY): Payer: Medicare Other | Admitting: Anesthesiology

## 2013-05-14 DIAGNOSIS — H2589 Other age-related cataract: Secondary | ICD-10-CM | POA: Insufficient documentation

## 2013-05-14 DIAGNOSIS — J4489 Other specified chronic obstructive pulmonary disease: Secondary | ICD-10-CM | POA: Insufficient documentation

## 2013-05-14 DIAGNOSIS — J449 Chronic obstructive pulmonary disease, unspecified: Secondary | ICD-10-CM | POA: Insufficient documentation

## 2013-05-14 DIAGNOSIS — I10 Essential (primary) hypertension: Secondary | ICD-10-CM | POA: Insufficient documentation

## 2013-05-14 HISTORY — PX: CATARACT EXTRACTION W/PHACO: SHX586

## 2013-05-14 SURGERY — PHACOEMULSIFICATION, CATARACT, WITH IOL INSERTION
Anesthesia: Monitor Anesthesia Care | Site: Eye | Laterality: Right | Wound class: Clean

## 2013-05-14 MED ORDER — ONDANSETRON HCL 4 MG/2ML IJ SOLN
INTRAMUSCULAR | Status: AC
  Filled 2013-05-14: qty 2

## 2013-05-14 MED ORDER — PROVISC 10 MG/ML IO SOLN
INTRAOCULAR | Status: DC | PRN
  Administered 2013-05-14: 0.85 mL via INTRAOCULAR

## 2013-05-14 MED ORDER — POVIDONE-IODINE 5 % OP SOLN
OPHTHALMIC | Status: DC | PRN
  Administered 2013-05-14: 1 via OPHTHALMIC

## 2013-05-14 MED ORDER — MIDAZOLAM HCL 2 MG/2ML IJ SOLN
1.0000 mg | INTRAMUSCULAR | Status: DC | PRN
  Administered 2013-05-14: 2 mg via INTRAVENOUS

## 2013-05-14 MED ORDER — FENTANYL CITRATE 0.05 MG/ML IJ SOLN
25.0000 ug | INTRAMUSCULAR | Status: AC
  Administered 2013-05-14 (×2): 25 ug via INTRAVENOUS

## 2013-05-14 MED ORDER — NEOMYCIN-POLYMYXIN-DEXAMETH 3.5-10000-0.1 OP SUSP
OPHTHALMIC | Status: DC | PRN
  Administered 2013-05-14: 2 [drp] via OPHTHALMIC

## 2013-05-14 MED ORDER — TETRACAINE HCL 0.5 % OP SOLN
1.0000 [drp] | OPHTHALMIC | Status: AC
  Administered 2013-05-14 (×3): 1 [drp] via OPHTHALMIC

## 2013-05-14 MED ORDER — PHENYLEPHRINE HCL 2.5 % OP SOLN
1.0000 [drp] | OPHTHALMIC | Status: AC
  Administered 2013-05-14 (×3): 1 [drp] via OPHTHALMIC

## 2013-05-14 MED ORDER — CYCLOPENTOLATE-PHENYLEPHRINE 0.2-1 % OP SOLN
1.0000 [drp] | OPHTHALMIC | Status: AC
  Administered 2013-05-14 (×3): 1 [drp] via OPHTHALMIC

## 2013-05-14 MED ORDER — LIDOCAINE 3.5 % OP GEL OPTIME - NO CHARGE
OPHTHALMIC | Status: DC | PRN
  Administered 2013-05-14: 2 [drp] via OPHTHALMIC

## 2013-05-14 MED ORDER — ONDANSETRON HCL 4 MG/2ML IJ SOLN
4.0000 mg | Freq: Once | INTRAMUSCULAR | Status: AC
  Administered 2013-05-14: 4 mg via INTRAVENOUS

## 2013-05-14 MED ORDER — LIDOCAINE HCL (PF) 1 % IJ SOLN
INTRAMUSCULAR | Status: DC | PRN
  Administered 2013-05-14: .7 mL

## 2013-05-14 MED ORDER — EPINEPHRINE HCL 1 MG/ML IJ SOLN
INTRAMUSCULAR | Status: AC
  Filled 2013-05-14: qty 1

## 2013-05-14 MED ORDER — BSS IO SOLN
INTRAOCULAR | Status: DC | PRN
  Administered 2013-05-14: 15 mL via INTRAOCULAR

## 2013-05-14 MED ORDER — EPINEPHRINE HCL 1 MG/ML IJ SOLN
INTRAOCULAR | Status: DC | PRN
  Administered 2013-05-14: 08:00:00

## 2013-05-14 MED ORDER — LACTATED RINGERS IV SOLN
INTRAVENOUS | Status: DC
  Administered 2013-05-14: 1000 mL via INTRAVENOUS

## 2013-05-14 MED ORDER — FENTANYL CITRATE 0.05 MG/ML IJ SOLN
INTRAMUSCULAR | Status: AC
  Filled 2013-05-14: qty 2

## 2013-05-14 MED ORDER — LIDOCAINE HCL 3.5 % OP GEL
1.0000 "application " | Freq: Once | OPHTHALMIC | Status: AC
  Administered 2013-05-14: 1 via OPHTHALMIC

## 2013-05-14 MED ORDER — MIDAZOLAM HCL 2 MG/2ML IJ SOLN
INTRAMUSCULAR | Status: AC
  Filled 2013-05-14: qty 2

## 2013-05-14 SURGICAL SUPPLY — 33 items
CAPSULAR TENSION RING-AMO (OPHTHALMIC RELATED) IMPLANT
CLOTH BEACON ORANGE TIMEOUT ST (SAFETY) ×1 IMPLANT
DRAPE X-RAY CASS 24X20 (DRAPES) ×1 IMPLANT
EYE SHIELD UNIVERSAL CLEAR (GAUZE/BANDAGES/DRESSINGS) ×1 IMPLANT
GLOVE BIO SURGEON STRL SZ 6.5 (GLOVE) IMPLANT
GLOVE BIOGEL PI IND STRL 6.5 (GLOVE) IMPLANT
GLOVE BIOGEL PI IND STRL 7.0 (GLOVE) IMPLANT
GLOVE BIOGEL PI IND STRL 7.5 (GLOVE) IMPLANT
GLOVE BIOGEL PI INDICATOR 6.5 (GLOVE)
GLOVE BIOGEL PI INDICATOR 7.0 (GLOVE) ×2
GLOVE BIOGEL PI INDICATOR 7.5 (GLOVE)
GLOVE ECLIPSE 6.5 STRL STRAW (GLOVE) IMPLANT
GLOVE ECLIPSE 7.0 STRL STRAW (GLOVE) IMPLANT
GLOVE ECLIPSE 7.5 STRL STRAW (GLOVE) IMPLANT
GLOVE EXAM NITRILE LRG STRL (GLOVE) IMPLANT
GLOVE EXAM NITRILE MD LF STRL (GLOVE) IMPLANT
GLOVE SKINSENSE NS SZ6.5 (GLOVE)
GLOVE SKINSENSE NS SZ7.0 (GLOVE)
GLOVE SKINSENSE STRL SZ6.5 (GLOVE) IMPLANT
GLOVE SKINSENSE STRL SZ7.0 (GLOVE) IMPLANT
KIT VITRECTOMY (OPHTHALMIC RELATED) IMPLANT
PAD ARMBOARD 7.5X6 YLW CONV (MISCELLANEOUS) ×1 IMPLANT
PROC W NO LENS (INTRAOCULAR LENS)
PROC W SPEC LENS (INTRAOCULAR LENS)
PROCESS W NO LENS (INTRAOCULAR LENS) IMPLANT
PROCESS W SPEC LENS (INTRAOCULAR LENS) IMPLANT
RING MALYGIN (MISCELLANEOUS) IMPLANT
SIGHTPATH CAT PROC W REG LENS (Ophthalmic Related) ×2 IMPLANT
SYR TB 1ML LL NO SAFETY (SYRINGE) ×1 IMPLANT
TAPE SURG TRANSPORE 1 IN (GAUZE/BANDAGES/DRESSINGS) IMPLANT
TAPE SURGICAL TRANSPORE 1 IN (GAUZE/BANDAGES/DRESSINGS) ×1
VISCOELASTIC ADDITIONAL (OPHTHALMIC RELATED) IMPLANT
WATER STERILE IRR 250ML POUR (IV SOLUTION) ×1 IMPLANT

## 2013-05-14 NOTE — H&P (Signed)
I have reviewed the H&P, the patient was re-examined, and I have identified no interval changes in medical condition and plan of care since the history and physical of record  

## 2013-05-14 NOTE — Anesthesia Postprocedure Evaluation (Signed)
  Anesthesia Post-op Note  Patient: [name]  Procedure(s) Performed: Procedure(s) (LRB): CATARACT EXTRACTION PHACO AND INTRAOCULAR LENS PLACEMENT RIGHT EYE (Right)  Patient Location:  Short Stay  Anesthesia Type: MAC  Level of Consciousness: awake  Airway and Oxygen Therapy: Patient Spontanous Breathing  Post-op Pain: none  Post-op Assessment: Post-op Vital signs reviewed, Patient's Cardiovascular Status Stable, Respiratory Function Stable, Patent Airway, No signs of Nausea or vomiting and Pain level controlled  Post-op Vital Signs: Reviewed and stable  Complications: No apparent anesthesia complications

## 2013-05-14 NOTE — Anesthesia Preprocedure Evaluation (Addendum)
Anesthesia Evaluation  Patient identified by MRN, date of birth, ID band Patient awake    Reviewed: H&P , NPO status , Patient's Chart, lab work & pertinent test results, reviewed documented beta blocker date and time   History of Anesthesia Complications (+) PONV and history of anesthetic complications  Airway Mallampati: II TM Distance: >3 FB     Dental  (+) Edentulous Upper   Pulmonary shortness of breath, COPD breath sounds clear to auscultation        Cardiovascular hypertension, + CAD, + Past MI and + Peripheral Vascular Disease + dysrhythmias Atrial Fibrillation Rhythm:Irregular Rate:Normal     Neuro/Psych    GI/Hepatic GERD-  Medicated,  Endo/Other    Renal/GU      Musculoskeletal   Abdominal   Peds  Hematology   Anesthesia Other Findings   Reproductive/Obstetrics                          Anesthesia Physical Anesthesia Plan  ASA: III  Anesthesia Plan: MAC   Post-op Pain Management:    Induction: Intravenous  Airway Management Planned: Nasal Cannula  Additional Equipment:   Intra-op Plan:   Post-operative Plan:   Informed Consent: I have reviewed the patients History and Physical, chart, labs and discussed the procedure including the risks, benefits and alternatives for the proposed anesthesia with the patient or authorized representative who has indicated his/her understanding and acceptance.     Plan Discussed with:   Anesthesia Plan Comments:         Anesthesia Quick Evaluation

## 2013-05-14 NOTE — Op Note (Signed)
Date of Admission: 05/14/2013  Date of Surgery: 05/14/2013  Pre-Op Dx: Cataract  Right  Eye  Post-Op Dx: Combined Cataract  Right  Eye,  Dx Code 366.19  Surgeon: Tonny Branch, M.D.  Assistants: None  Anesthesia: Topical with MAC  Indications: Painless, progressive loss of vision with compromise of daily activities.  Surgery: Cataract Extraction with Intraocular lens Implant Right Eye  Discription: The patient had dilating drops and viscous lidocaine placed into the right eye in the pre-op holding area. After transfer to the operating room, a time out was performed. The patient was then prepped and draped. Beginning with a 40 degree blade a paracentesis port was made at the surgeon's 2 o'clock position. The anterior chamber was then filled with 1% non-preserved lidocaine. This was followed by filling the anterior chamber with Provisc.  A 2.61mm keratome blade was used to make a clear corneal incision at the temporal limbus.  A bent cystatome needle was used to create a continuous tear capsulotomy. Hydrodissection was performed with balanced salt solution on a Fine canula. The lens nucleus was then removed using the phacoemulsification handpiece. Residual cortex was removed with the I&A handpiece. The anterior chamber and capsular bag were refilled with Provisc. A posterior chamber intraocular lens was placed into the capsular bag with it's injector. The implant was positioned with the Kuglan hook. The Provisc was then removed from the anterior chamber and capsular bag with the I&A handpiece. Stromal hydration of the main incision and paracentesis port was performed with BSS on a Fine canula. The wounds were tested for leak which was negative. The patient tolerated the procedure well. There were no operative complications. The patient was then transferred to the recovery room in stable condition.  Complications: None  Specimen: None  EBL: None  Prosthetic device: B&L enVista, MX60, power 23.0D,  SN [id number].

## 2013-05-14 NOTE — Anesthesia Procedure Notes (Signed)
Procedure Name: MAC Date/Time: 05/14/2013 7:56 AM Performed by: Vista Deck Pre-anesthesia Checklist: Patient identified, Emergency Drugs available, Suction available, Timeout performed and Patient being monitored Patient Re-evaluated:Patient Re-evaluated prior to inductionOxygen Delivery Method: Nasal Cannula

## 2013-05-14 NOTE — Transfer of Care (Signed)
Immediate Anesthesia Transfer of Care Note  Patient: [name]  Procedure(s) Performed: Procedure(s) (LRB): CATARACT EXTRACTION PHACO AND INTRAOCULAR LENS PLACEMENT RIGHT EYE (Right)  Patient Location: Shortstay  Anesthesia Type: MAC  Level of Consciousness: awake  Airway & Oxygen Therapy: Patient Spontanous Breathing   Post-op Assessment: Report given to PACU RN, Post -op Vital signs reviewed and stable and Patient moving all extremities  Post vital signs: Reviewed and stable  Complications: No apparent anesthesia complications

## 2013-05-15 ENCOUNTER — Encounter (HOSPITAL_COMMUNITY): Payer: Self-pay | Admitting: Ophthalmology

## 2013-05-16 ENCOUNTER — Ambulatory Visit (INDEPENDENT_AMBULATORY_CARE_PROVIDER_SITE_OTHER): Payer: Medicare Other

## 2013-05-16 VITALS — BP 100/56 | HR 72 | Ht 67.0 in | Wt 189.5 lb

## 2013-05-16 DIAGNOSIS — I2510 Atherosclerotic heart disease of native coronary artery without angina pectoris: Secondary | ICD-10-CM

## 2013-05-16 DIAGNOSIS — I4891 Unspecified atrial fibrillation: Secondary | ICD-10-CM

## 2013-05-16 NOTE — Progress Notes (Signed)
Pt presents today for EKG, which shows that she is still in a-fib. Pt continues to smoke, currently 1 ppd, reports this is a drastic decrease from 3 ppd. Pt is not interested in trying chantix, as she tried it before and could not sleep. She states that she is interested in trying hypnosis to help her stop smoking.  On last ov 05/01/13, pt was instructed to take amiodarone 400mg  bid x 5 days, then decrease to 200mg  bid. Pt reports that the misunderstood the instructions and has been taking 100mg  bid. She would like to know if Dr. Rockey Situ would like for her to switch to 200mg  amiodarone or he prefers that she proceed with scheduling a cardioversion.  Spoke w/ Dr. Rockey Situ.  Instructed pt to take amiodarone 200mg  bid and go ahead and schedule cardioversion, as they will perform EKG before procedure and determine if she is still in a-fib. Cardioversion sched for 11/21 @ 7:30. Pre-procedure labs drawn today.

## 2013-05-16 NOTE — Patient Instructions (Addendum)
Your physician has recommended that you have a Cardioversion (DCCV).  Electrical Cardioversion uses a jolt of electricity to your heart either through paddles or wired patches attached to your chest.  This is a controlled, usually prescheduled, procedure.  Defibrillation is done under light anesthesia in the hospital, and you usually go home the day of the procedure.  This is done to get your heart back into a normal rhythm.  You are not awake for the procedure.  Please see the instruction sheet given to you today.  Please arrive at the [place] entrance of [facility] on Friday, Nov 21 @ 6:30. Please do not take lasix the morning of procedure. Nothing to eat or drink after midnight. You may take all of your other meds with a sip of water that morning.

## 2013-05-21 ENCOUNTER — Other Ambulatory Visit: Payer: Self-pay | Admitting: *Deleted

## 2013-05-21 MED ORDER — AMIODARONE HCL 200 MG PO TABS: 200.0000 mg | ORAL_TABLET | Freq: Two times a day (BID) | ORAL | Status: DC

## 2013-05-21 NOTE — Telephone Encounter (Signed)
Requested Prescriptions   Signed Prescriptions Disp Refills  . amiodarone (PACERONE) 200 MG tablet 60 tablet 3    Sig: Take 1 tablet (200 mg total) by mouth 2 (two) times daily.    Authorizing Provider: Minna Merritts    Ordering User: Britt Bottom

## 2013-05-22 ENCOUNTER — Telehealth: Payer: Self-pay

## 2013-05-22 NOTE — Telephone Encounter (Signed)
Spoke w/ pt.  She states that her prescription for amiodarone is "all messed up" and she has been taking "2 twice a day and now I'm out". Informed pt that on her office visit with Dr. Rockey Situ on 10/28, she was instructed to take 400mg  bid x 5 days, then decrease to 200mg  bid. On her nurse visit 11/12, she informed me that she was only taking 100mg  bid, but was instructed to go to the correct dose of 200mg  bid. Pt states today that she has been taking 400 mg bid, as "y'all told me to take 2 twice a day." After lengthy discussion with pt about correct dosing, she reports that her ins will not cover anymore and she's "just going to quit taking it all together.  It costs too much to take all this medicine." Pt sched for cardioversion 11/21, but reports that she only has 4 more pills. Spoke w/ pharmacist at [facility], who states that pt's Medicaid will not cover any more this month, but they "will not let her go without it" and will work out payment method with pt. Pharmacist states that they were concerned that pt was taking 800mg  amiodarone a day, but pt insisted that was the correct dose.  Tried calling pt back, but no answer.

## 2013-05-22 NOTE — Telephone Encounter (Signed)
Pt called and states that her Amiodarone was called in wrong states it should be 2 twice a day

## 2013-05-22 NOTE — Telephone Encounter (Signed)
Pt called back and stated that the pharmacy is giving her "enough pills to get through the 23rd".

## 2013-05-22 NOTE — Telephone Encounter (Signed)
If she has enough for 200 mg twice a day until the cardioversion, that would be best option

## 2013-05-25 ENCOUNTER — Ambulatory Visit: Payer: Self-pay | Admitting: Cardiovascular Disease

## 2013-05-25 DIAGNOSIS — I4892 Unspecified atrial flutter: Secondary | ICD-10-CM

## 2013-05-26 ENCOUNTER — Telehealth: Payer: Self-pay | Admitting: Physician Assistant

## 2013-05-26 NOTE — Telephone Encounter (Signed)
Patient called the answering service with questions regarding amiodarone. She underwent successful TEE/DCCV yesterday for atrial flutter. She had been orally loaded with amiodarone leading up to the procedure. This had been tapered from 400 BID to 200 BID. She wants to know if she should continue this post-DCCV. She has 5-6 tablets and a refill left. Advised to continue for now through the weekend. Will relay to the [place] office and Dr. Rockey Situ regarding continuation or discontinuation of amiodarone. She understood and agreed.   Jacquelynn Cree, PA-C 05/26/2013 9:14 AM

## 2013-05-27 NOTE — Telephone Encounter (Signed)
Would monitor heart rate and call our office if less than 55 continue amiodarone 200 twice a day for now If still in normal rhythm in follow up, Will drop amiodarone to one a day

## 2013-05-28 NOTE — Telephone Encounter (Signed)
Spoke w/ pt.  She is agreeable to plan, will monitor HR, continue meds, and keep her appt w/ Dr. Rockey Situ on Monday.

## 2013-05-28 NOTE — Telephone Encounter (Signed)
Left message for pt to call back  °

## 2013-06-04 ENCOUNTER — Ambulatory Visit: Payer: Medicare Other | Admitting: Cardiovascular Disease

## 2013-06-05 ENCOUNTER — Encounter: Payer: Self-pay | Admitting: Cardiovascular Disease

## 2013-06-05 ENCOUNTER — Ambulatory Visit (INDEPENDENT_AMBULATORY_CARE_PROVIDER_SITE_OTHER): Payer: Medicare Other | Admitting: Cardiovascular Disease

## 2013-06-05 VITALS — BP 132/60 | HR 60 | Ht 67.0 in | Wt 187.5 lb

## 2013-06-05 DIAGNOSIS — I4891 Unspecified atrial fibrillation: Secondary | ICD-10-CM

## 2013-06-05 DIAGNOSIS — F17200 Nicotine dependence, unspecified, uncomplicated: Secondary | ICD-10-CM

## 2013-06-05 DIAGNOSIS — E785 Hyperlipidemia, unspecified: Secondary | ICD-10-CM

## 2013-06-05 DIAGNOSIS — R5381 Other malaise: Secondary | ICD-10-CM

## 2013-06-05 DIAGNOSIS — I2510 Atherosclerotic heart disease of native coronary artery without angina pectoris: Secondary | ICD-10-CM

## 2013-06-05 DIAGNOSIS — R5383 Other fatigue: Secondary | ICD-10-CM

## 2013-06-05 DIAGNOSIS — R0602 Shortness of breath: Secondary | ICD-10-CM

## 2013-06-05 NOTE — Assessment & Plan Note (Signed)
We have encouraged her to continue to work on weaning her cigarettes and smoking cessation. She will continue to work on this and does not want any assistance with chantix.  

## 2013-06-05 NOTE — Patient Instructions (Addendum)
You are doing well. You are in normal rhythm Please decrease the amiodarone down to 200 mg one a day  Keep using the lasix couple times a week with potassium Stay on pradaxa twice a day  Please call us if you have new issues that need to be addressed before your next appt.  Your physician wants you to follow-up in: 6 months.  You will receive a reminder letter in the mail two months in advance. If you don't receive a letter, please call our office to schedule the follow-up appointment.

## 2013-06-05 NOTE — Assessment & Plan Note (Signed)
She's had difficulty tolerating statins in the past

## 2013-06-05 NOTE — Assessment & Plan Note (Signed)
Currently with no symptoms of angina. No further workup at this time. Continue current medication regimen. 

## 2013-06-05 NOTE — Progress Notes (Signed)
Patient ID: [name], female    DOB: Dec 06, 1940, 72 y.o.   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 In September 2014 she had CHF symptoms, found to be in atrial fibrillation. She was started on anticoagulation, given Lasix.  She underwent cardioversion after she failed to convert to normal sinus rhythm on amiodarone by mouth. Cardioversion 05/25/2013 which was successful in restoring normal sinus rhythm. She had converted from atrial fibrillation to atrial flutter at the time of the cardioversion. Since then chest felt well though has decreased energy. She recently at all for Christmas lights. She has been tolerating pradaxa twice a day with no complaints.  She does report having chest pain symptoms last year. She had a stress test which was reportedly normal.  She continues to smoke  EKG shows normal sinus rhythm with rate 60 beats a minute, nonspecific ST abnormality   Outpatient Encounter Prescriptions as of 06/05/2013  Medication Sig  . Acetaminophen (TYLENOL PO) Take 500-1,000 mg by mouth every 6 (six) hours as needed (pain).   . ADVAIR DISKUS 500-50 MCG/DOSE AEPB 1 puff 2 (two) times daily.   Marland Kitchen albuterol (PROVENTIL) (2.5 MG/3ML) 0.083% nebulizer solution Take 2.5 mg by nebulization every 6 (six) hours as needed. For shortness of breath  . amiodarone (PACERONE) 200 MG tablet Take 1 tablet (200 mg total) by mouth 2 (two) times daily.  . Ascorbic Acid (VITAMIN C) 100 MG tablet Take 100 mg by mouth daily.   Marland Kitchen aspirin 81 MG tablet Take 81 mg by mouth daily.    . cholecalciferol (VITAMIN D) 1000 UNITS tablet Take 1,000 Units by mouth daily.   . clonazePAM (KLONOPIN) 0.5 MG tablet Take 0.5 mg by mouth at bedtime as needed. For sleep  . dabigatran (PRADAXA) 150 MG CAPS capsule Take 1 capsule (150 mg total) by mouth every 12 (twelve) hours.  . furosemide (LASIX) 20 MG tablet Take 1 tablet (20 mg total) by mouth 2 (two) times daily as needed.  . isosorbide mononitrate (IMDUR) 60 MG 24 hr tablet Take 60 mg by mouth daily.    Marland Kitchen lisinopril-hydrochlorothiazide (PRINZIDE,ZESTORETIC) 20-25 MG per tablet Take 1 tablet by mouth daily.  . meloxicam (MOBIC) 15 MG tablet Take 15 mg by mouth daily.  . metoprolol tartrate (LOPRESSOR) 25 MG tablet Take 25 mg by mouth 2 (two) times daily.  Marland Kitchen omega-3 acid ethyl esters (LOVAZA) 1 G capsule Take 2 g by mouth 2 (two) times daily.    . pantoprazole (PROTONIX) 40 MG tablet Take 40 mg by mouth daily.  . potassium chloride (K-DUR) 10 MEQ tablet Take 1 tablet (10 mEq total) by mouth 2 (two) times daily as needed.  . promethazine (PHENERGAN) 25 MG tablet Take 25 mg by mouth every 6 (six) hours as needed. For nausea     Review of Systems  Constitutional: Positive for fatigue.  HENT: Negative.   Eyes: Negative.   Cardiovascular: Negative.   Gastrointestinal: Negative.   Endocrine: Negative.   Musculoskeletal: Negative.   Skin: Negative.   Allergic/Immunologic: Negative.   Neurological: Negative.   Hematological: Negative.   Psychiatric/Behavioral: Negative.   All other systems  reviewed and are negative.    BP 132/60  Pulse 60  Ht 5\' 7"  (1.702 m)  Wt 187 lb 8 oz (85.049 kg)  BMI 29.36 kg/m2  Physical Exam  Nursing note and vitals reviewed. Constitutional: She is oriented to person, place, and time. She appears well-developed and well-nourished.  HENT:  Head: Normocephalic.  Nose: Nose normal.  Mouth/Throat: Oropharynx is clear and moist.  Eyes: Conjunctivae are normal. Pupils are equal, round, and reactive to light.  Neck: Normal range of motion. Neck supple. No JVD present.   Cardiovascular: Normal rate, regular rhythm, S1 normal, S2 normal, normal heart sounds and intact distal pulses.  Exam reveals no gallop and no friction rub.   No murmur heard. Pulmonary/Chest: Effort normal and breath sounds normal. No respiratory distress. She has no wheezes. She has no rales. She exhibits no tenderness.  Abdominal: Soft. Bowel sounds are normal. She exhibits no distension. There is no tenderness.  Musculoskeletal: Normal range of motion. She exhibits no edema and no tenderness.  Lymphadenopathy:    She has no cervical adenopathy.  Neurological: She is alert and oriented to person, place, and time. Coordination normal.  Skin: Skin is warm and dry. No rash noted. No erythema.  Psychiatric: She has a normal mood and affect. Her behavior is normal. Judgment and thought content normal.    Assessment and Plan

## 2013-06-05 NOTE — Assessment & Plan Note (Addendum)
Recent cardioversion at the end of November 2014 which was successful. She is maintaining normal sinus rhythm. We will continue her on low-dose metoprolol twice a day, decrease the amiodarone down to 200 mg daily. Continue anticoagulation for now

## 2013-06-27 ENCOUNTER — Encounter: Payer: Self-pay | Admitting: *Deleted

## 2013-11-21 ENCOUNTER — Other Ambulatory Visit: Payer: Self-pay | Admitting: *Deleted

## 2013-11-23 ENCOUNTER — Other Ambulatory Visit: Payer: Self-pay | Admitting: Cardiovascular Disease

## 2013-11-23 NOTE — Telephone Encounter (Signed)
Requested Prescriptions   Signed Prescriptions Disp Refills  . PRADAXA 150 MG CAPS capsule 60 capsule 3    Sig: TAKE ONE CAPSULE EVERY 12 HOURS    Authorizing Provider: Minna Merritts    Ordering User: Britt Bottom

## 2014-01-01 ENCOUNTER — Other Ambulatory Visit (HOSPITAL_COMMUNITY): Payer: Self-pay | Admitting: Family Medicine

## 2014-01-01 DIAGNOSIS — Z1231 Encounter for screening mammogram for malignant neoplasm of breast: Secondary | ICD-10-CM

## 2014-01-01 DIAGNOSIS — Z780 Asymptomatic menopausal state: Secondary | ICD-10-CM

## 2014-01-02 NOTE — Telephone Encounter (Signed)
This encounter was created in error - please disregard.

## 2014-01-07 ENCOUNTER — Other Ambulatory Visit: Payer: Self-pay | Admitting: *Deleted

## 2014-01-07 ENCOUNTER — Other Ambulatory Visit: Payer: Self-pay

## 2014-01-07 MED ORDER — AMIODARONE HCL 200 MG PO TABS: 200.0000 mg | ORAL_TABLET | Freq: Two times a day (BID) | ORAL | Status: DC

## 2014-01-10 ENCOUNTER — Other Ambulatory Visit (HOSPITAL_COMMUNITY): Payer: Medicare Other

## 2014-01-10 ENCOUNTER — Ambulatory Visit (HOSPITAL_COMMUNITY)
Admission: RE | Admit: 2014-01-10 | Discharge: 2014-01-10 | Disposition: A | Discharge status: Home / Self Care | Payer: Medicare HMO | Source: Ambulatory Visit | Attending: Family Medicine | Admitting: Family Medicine

## 2014-01-10 DIAGNOSIS — Z1231 Encounter for screening mammogram for malignant neoplasm of breast: Secondary | ICD-10-CM | POA: Insufficient documentation

## 2014-01-14 ENCOUNTER — Other Ambulatory Visit (HOSPITAL_COMMUNITY): Payer: Medicare Other

## 2014-01-17 ENCOUNTER — Other Ambulatory Visit (HOSPITAL_COMMUNITY): Payer: Medicare Other

## 2014-01-18 ENCOUNTER — Encounter: Payer: Self-pay | Admitting: Cardiovascular Disease

## 2014-01-18 ENCOUNTER — Ambulatory Visit (INDEPENDENT_AMBULATORY_CARE_PROVIDER_SITE_OTHER): Payer: Medicare HMO | Admitting: Cardiovascular Disease

## 2014-01-18 VITALS — BP 142/70 | HR 44 | Ht 68.0 in | Wt 184.8 lb

## 2014-01-18 DIAGNOSIS — R0602 Shortness of breath: Secondary | ICD-10-CM

## 2014-01-18 DIAGNOSIS — E785 Hyperlipidemia, unspecified: Secondary | ICD-10-CM

## 2014-01-18 DIAGNOSIS — I2510 Atherosclerotic heart disease of native coronary artery without angina pectoris: Secondary | ICD-10-CM

## 2014-01-18 DIAGNOSIS — M542 Cervicalgia: Secondary | ICD-10-CM

## 2014-01-18 DIAGNOSIS — I4891 Unspecified atrial fibrillation: Secondary | ICD-10-CM

## 2014-01-18 DIAGNOSIS — F17200 Nicotine dependence, unspecified, uncomplicated: Secondary | ICD-10-CM

## 2014-01-18 MED ORDER — MELOXICAM 15 MG PO TABS: 15.0000 mg | ORAL_TABLET | Freq: Every day | ORAL | Status: DC

## 2014-01-18 NOTE — Patient Instructions (Addendum)
You are doing well.  Ok to take meloxicam  If your heart rate runs in the 40s, Please cut the metoprolol in 1/2 twice a day  Please call us if you have new issues that need to be addressed before your next appt.  Your physician wants you to follow-up in: 6 months.  You will receive a reminder letter in the mail two months in advance. If you don't receive a letter, please call our office to schedule the follow-up appointment.

## 2014-01-18 NOTE — Assessment & Plan Note (Signed)
Currently not on a statin. History of myalgias

## 2014-01-18 NOTE — Progress Notes (Signed)
Patient ID: [name], female    DOB: May 30, 1941, 73 y.o.   MRN: [id number]  HPI Comments: Ms. 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 In September 2014 she had CHF symptoms, found to be in atrial fibrillation. She was started on anticoagulation, given Lasix.  She underwent cardioversion after she failed to convert to normal sinus rhythm on amiodarone. Cardioversion 05/25/2013 which was successful in restoring normal sinus rhythm. She had converted from atrial fibrillation to atrial flutter at the time of the cardioversion. She is tolerating anticoagulation.  Her biggest complaint today is her chronic neck pain, shoulder pain. She typically takes meloxicam daily. She has been doing this for some time even on her anticoagulation with no problems. She reports having a ruptured disc in her neck. She feels tired for the past 2 or 3 days. Heart rate at home typically running in the mid 50s up to 60 No recent episodes of chest pain   EKG shows normal sinus rhythm with rate 44 beats a minute, no significant ST or T wave changes   Outpatient Encounter Prescriptions as of 01/18/2014  Medication Sig  . Acetaminophen (TYLENOL PO) Take 500-1,000 mg by mouth every 6 (six) hours as needed (pain).   . ADVAIR DISKUS 500-50 MCG/DOSE AEPB 1 puff 2 (two) times daily.   Marland Kitchen albuterol (PROVENTIL) (2.5 MG/3ML) 0.083% nebulizer solution Take 2.5 mg by nebulization every 6 (six) hours as needed. For shortness of breath  . amiodarone (PACERONE) 200 MG tablet Take 1 tablet (200 mg total) by mouth 2 (two) times daily.  . Ascorbic Acid (VITAMIN C) 100 MG tablet Take 100 mg by mouth daily.   Marland Kitchen aspirin 81 MG tablet  Take 81 mg by mouth daily.    . cholecalciferol (VITAMIN D) 1000 UNITS tablet Take 1,000 Units by mouth daily.  . clonazePAM (KLONOPIN) 0.5 MG tablet Take 0.5 mg by mouth at bedtime as needed. For sleep  . furosemide (LASIX) 20 MG tablet Take 1 tablet (20 mg total) by mouth 2 (two) times daily as needed.  . isosorbide mononitrate (IMDUR) 60 MG 24 hr tablet Take 60 mg by mouth daily.    Marland Kitchen lisinopril-hydrochlorothiazide (PRINZIDE,ZESTORETIC) 20-25 MG per tablet Take 1 tablet by mouth daily.  . metoprolol tartrate (LOPRESSOR) 25 MG tablet Take 25 mg by mouth 2 (two) times daily.  Marland Kitchen omega-3 acid ethyl esters (LOVAZA) 1 G capsule Take 2 g by mouth 2 (two) times daily.    . pantoprazole (PROTONIX) 40 MG tablet Take 40 mg by mouth daily.  . potassium chloride (K-DUR) 10 MEQ tablet Take 1 tablet (10 mEq total) by mouth 2 (two) times daily as needed.  Marland Kitchen PRADAXA 150 MG CAPS capsule TAKE ONE CAPSULE EVERY 12 HOURS  . meloxicam (MOBIC) 15 MG tablet Take 15 mg by mouth daily.    Review of Systems  Constitutional: Positive for fatigue.  HENT: Negative.   Eyes: Negative.   Respiratory: Negative.   Cardiovascular: Negative.   Gastrointestinal: Negative.   Endocrine: Negative.   Musculoskeletal: Positive for arthralgias and neck pain.  Skin: Negative.   Allergic/Immunologic: Negative.   Neurological: Negative.   Hematological: Negative.   Psychiatric/Behavioral: Negative.  All other systems reviewed and are negative.   BP 142/70  Pulse 44  Ht 5\' 8"  (1.727 m)  Wt 184 lb 12 oz (83.802 kg)  BMI 28.10 kg/m2  Physical Exam  Nursing note and vitals reviewed. Constitutional: She is oriented to person, place, and time. She appears well-developed and well-nourished.  HENT:  Head: Normocephalic.  Nose: Nose normal.  Mouth/Throat: Oropharynx is clear and moist.  Eyes: Conjunctivae are normal. Pupils are equal, round, and reactive to light.  Neck: Normal range of motion. Neck supple. No JVD present.   Cardiovascular: Normal rate, regular rhythm, S1 normal, S2 normal, normal heart sounds and intact distal pulses.  Exam reveals no gallop and no friction rub.   No murmur heard. Pulmonary/Chest: Effort normal and breath sounds normal. No respiratory distress. She has no wheezes. She has no rales. She exhibits no tenderness.  Abdominal: Soft. Bowel sounds are normal. She exhibits no distension. There is no tenderness.  Musculoskeletal: Normal range of motion. She exhibits no edema and no tenderness.  Lymphadenopathy:    She has no cervical adenopathy.  Neurological: She is alert and oriented to person, place, and time. Coordination normal.  Skin: Skin is warm and dry. No rash noted. No erythema.  Psychiatric: She has a normal mood and affect. Her behavior is normal. Judgment and thought content normal.    Assessment and Plan

## 2014-01-18 NOTE — Assessment & Plan Note (Signed)
Maintaining normal sinus rhythm. Bradycardia on today's visit. She denies having any symptoms and states heart rate is 55-60 at home. We have suggested she closely monitor her heart rate at home and if this continues to run slow, we would decrease the metoprolol in half twice a day

## 2014-01-18 NOTE — Assessment & Plan Note (Signed)
We have encouraged her to continue to work on weaning her cigarettes and smoking cessation. She will continue to work on this and does not want any assistance with chantix.  

## 2014-01-18 NOTE — Assessment & Plan Note (Addendum)
We'll restart meloxicam. She was doing much better on NSAIDs. No recent bleeding. She is on PPI

## 2014-01-18 NOTE — Assessment & Plan Note (Signed)
No symptoms concerning for angina, no EKG changes. We have discussed this and suggested she call us for any symptoms

## 2014-02-04 ENCOUNTER — Other Ambulatory Visit (HOSPITAL_COMMUNITY): Payer: Medicare Other

## 2014-02-11 ENCOUNTER — Ambulatory Visit (HOSPITAL_COMMUNITY)
Admission: RE | Admit: 2014-02-11 | Discharge: 2014-02-11 | Disposition: A | Discharge status: Home / Self Care | Payer: Medicare HMO | Source: Ambulatory Visit | Attending: Family Medicine | Admitting: Family Medicine

## 2014-02-11 DIAGNOSIS — Z780 Asymptomatic menopausal state: Secondary | ICD-10-CM

## 2014-03-20 ENCOUNTER — Other Ambulatory Visit: Payer: Self-pay | Admitting: Cardiovascular Disease

## 2014-04-12 ENCOUNTER — Ambulatory Visit (HOSPITAL_COMMUNITY)
Admission: EM | Admit: 2014-04-12 | Discharge: 2014-04-12 | Disposition: A | Discharge status: Home / Self Care | Payer: Medicare HMO | Attending: Emergency Medicine | Admitting: Emergency Medicine

## 2014-04-12 ENCOUNTER — Emergency Department (HOSPITAL_COMMUNITY): Payer: Medicare HMO

## 2014-04-12 ENCOUNTER — Emergency Department (HOSPITAL_COMMUNITY): Payer: Medicare HMO | Admitting: Anesthesiology

## 2014-04-12 ENCOUNTER — Encounter (HOSPITAL_COMMUNITY): Payer: Self-pay | Admitting: Emergency Medicine

## 2014-04-12 ENCOUNTER — Encounter (HOSPITAL_COMMUNITY): Payer: Medicare HMO | Admitting: Anesthesiology

## 2014-04-12 ENCOUNTER — Encounter (HOSPITAL_COMMUNITY)
Admission: EM | Disposition: A | Discharge status: Home / Self Care | Payer: Self-pay | Source: Home / Self Care | Attending: Emergency Medicine

## 2014-04-12 DIAGNOSIS — W230XXA Caught, crushed, jammed, or pinched between moving objects, initial encounter: Secondary | ICD-10-CM | POA: Diagnosis not present

## 2014-04-12 DIAGNOSIS — S68112A Complete traumatic metacarpophalangeal amputation of right middle finger, initial encounter: Secondary | ICD-10-CM | POA: Diagnosis not present

## 2014-04-12 DIAGNOSIS — Y9269 Other specified industrial and construction area as the place of occurrence of the external cause: Secondary | ICD-10-CM | POA: Insufficient documentation

## 2014-04-12 DIAGNOSIS — S67192A Crushing injury of right middle finger, initial encounter: Secondary | ICD-10-CM | POA: Diagnosis not present

## 2014-04-12 DIAGNOSIS — Z888 Allergy status to other drugs, medicaments and biological substances status: Secondary | ICD-10-CM | POA: Insufficient documentation

## 2014-04-12 DIAGNOSIS — S62609B Fracture of unspecified phalanx of unspecified finger, initial encounter for open fracture: Secondary | ICD-10-CM

## 2014-04-12 DIAGNOSIS — Y93H3 Activity, building and construction: Secondary | ICD-10-CM | POA: Diagnosis not present

## 2014-04-12 DIAGNOSIS — E781 Pure hyperglyceridemia: Secondary | ICD-10-CM | POA: Insufficient documentation

## 2014-04-12 DIAGNOSIS — F17210 Nicotine dependence, cigarettes, uncomplicated: Secondary | ICD-10-CM | POA: Diagnosis not present

## 2014-04-12 DIAGNOSIS — Y990 Civilian activity done for income or pay: Secondary | ICD-10-CM | POA: Insufficient documentation

## 2014-04-12 DIAGNOSIS — Z882 Allergy status to sulfonamides status: Secondary | ICD-10-CM | POA: Diagnosis not present

## 2014-04-12 DIAGNOSIS — J449 Chronic obstructive pulmonary disease, unspecified: Secondary | ICD-10-CM | POA: Diagnosis not present

## 2014-04-12 DIAGNOSIS — I2510 Atherosclerotic heart disease of native coronary artery without angina pectoris: Secondary | ICD-10-CM | POA: Diagnosis not present

## 2014-04-12 DIAGNOSIS — Z880 Allergy status to penicillin: Secondary | ICD-10-CM | POA: Diagnosis not present

## 2014-04-12 DIAGNOSIS — I252 Old myocardial infarction: Secondary | ICD-10-CM | POA: Diagnosis not present

## 2014-04-12 DIAGNOSIS — I10 Essential (primary) hypertension: Secondary | ICD-10-CM | POA: Insufficient documentation

## 2014-04-12 DIAGNOSIS — I4891 Unspecified atrial fibrillation: Secondary | ICD-10-CM | POA: Insufficient documentation

## 2014-04-12 DIAGNOSIS — K219 Gastro-esophageal reflux disease without esophagitis: Secondary | ICD-10-CM | POA: Insufficient documentation

## 2014-04-12 HISTORY — PX: AMPUTATION: SHX166

## 2014-04-12 LAB — POCT I-STAT 4, (NA,K, GLUC, HGB,HCT)
Glucose, Bld: 89 mg/dL (ref 70–99)
HEMATOCRIT: 35 % — AB (ref 36.0–46.0)
Hemoglobin: 11.9 g/dL — ABNORMAL LOW (ref 12.0–15.0)
Potassium: 3.5 mEq/L — ABNORMAL LOW (ref 3.7–5.3)
Sodium: 142 mEq/L (ref 137–147)

## 2014-04-12 SURGERY — AMPUTATION DIGIT
Anesthesia: General | Site: Finger | Laterality: Right

## 2014-04-12 MED ORDER — LACTATED RINGERS IV SOLN
INTRAVENOUS | Status: DC | PRN
  Administered 2014-04-12: 22:00:00 via INTRAVENOUS

## 2014-04-12 MED ORDER — DEXAMETHASONE SODIUM PHOSPHATE 4 MG/ML IJ SOLN
INTRAMUSCULAR | Status: DC | PRN
  Administered 2014-04-12: 4 mg via INTRAVENOUS

## 2014-04-12 MED ORDER — FENTANYL CITRATE 0.05 MG/ML IJ SOLN
50.0000 ug | Freq: Once | INTRAMUSCULAR | Status: AC
  Administered 2014-04-12: 50 ug via INTRAVENOUS
  Filled 2014-04-12: qty 2

## 2014-04-12 MED ORDER — VANCOMYCIN HCL 1000 MG IV SOLR
1000.0000 mg | INTRAVENOUS | Status: DC | PRN
  Administered 2014-04-12: 1000 mg via INTRAVENOUS

## 2014-04-12 MED ORDER — LIDOCAINE HCL (CARDIAC) 20 MG/ML IV SOLN
INTRAVENOUS | Status: DC | PRN
  Administered 2014-04-12: 80 mg via INTRAVENOUS

## 2014-04-12 MED ORDER — BUPIVACAINE HCL 0.25 % IJ SOLN
INTRAMUSCULAR | Status: DC | PRN
  Administered 2014-04-12: 10 mL

## 2014-04-12 MED ORDER — ALBUTEROL SULFATE HFA 108 (90 BASE) MCG/ACT IN AERS
INHALATION_SPRAY | RESPIRATORY_TRACT | Status: DC | PRN
  Administered 2014-04-12: 2 via RESPIRATORY_TRACT

## 2014-04-12 MED ORDER — 0.9 % SODIUM CHLORIDE (POUR BTL) OPTIME
TOPICAL | Status: DC | PRN
  Administered 2014-04-12: 1000 mL

## 2014-04-12 MED ORDER — FENTANYL CITRATE 0.05 MG/ML IJ SOLN
INTRAMUSCULAR | Status: DC | PRN
Start: 2014-04-12 — End: 2014-04-12
  Administered 2014-04-12: 25 ug via INTRAVENOUS

## 2014-04-12 MED ORDER — LIDOCAINE HCL (PF) 2 % IJ SOLN
INTRAMUSCULAR | Status: AC
  Administered 2014-04-12: 16:00:00
  Filled 2014-04-12: qty 10

## 2014-04-12 MED ORDER — PROPOFOL 10 MG/ML IV BOLUS
INTRAVENOUS | Status: AC
  Filled 2014-04-12: qty 20

## 2014-04-12 MED ORDER — BUPIVACAINE HCL (PF) 0.25 % IJ SOLN
INTRAMUSCULAR | Status: AC
  Filled 2014-04-12: qty 30

## 2014-04-12 MED ORDER — ONDANSETRON HCL 4 MG/2ML IJ SOLN
INTRAMUSCULAR | Status: DC | PRN
  Administered 2014-04-12: 4 mg via INTRAVENOUS

## 2014-04-12 MED ORDER — GLYCOPYRROLATE 0.2 MG/ML IJ SOLN
INTRAMUSCULAR | Status: DC | PRN
  Administered 2014-04-12: 0.2 mg via INTRAVENOUS

## 2014-04-12 MED ORDER — FENTANYL CITRATE 0.05 MG/ML IJ SOLN
INTRAMUSCULAR | Status: AC
  Filled 2014-04-12: qty 5

## 2014-04-12 MED ORDER — ONDANSETRON HCL 4 MG/2ML IJ SOLN
4.0000 mg | Freq: Once | INTRAMUSCULAR | Status: AC
  Administered 2014-04-12: 4 mg via INTRAVENOUS
  Filled 2014-04-12: qty 2

## 2014-04-12 MED ORDER — PROPOFOL 10 MG/ML IV BOLUS
INTRAVENOUS | Status: DC | PRN
  Administered 2014-04-12: 160 mg via INTRAVENOUS

## 2014-04-12 MED ORDER — FENTANYL CITRATE 0.05 MG/ML IJ SOLN: 25.0000 ug | INTRAMUSCULAR | Status: DC | PRN

## 2014-04-12 SURGICAL SUPPLY — 44 items
BLADE LONG MED 31MMX9MM (MISCELLANEOUS) ×1
BLADE LONG MED 31X9 (MISCELLANEOUS) ×2 IMPLANT
BNDG CMPR 9X4 STRL LF SNTH (GAUZE/BANDAGES/DRESSINGS) ×1
BNDG COHESIVE 1X5 TAN STRL LF (GAUZE/BANDAGES/DRESSINGS) ×2 IMPLANT
BNDG COHESIVE 3X5 TAN STRL LF (GAUZE/BANDAGES/DRESSINGS) IMPLANT
BNDG CONFORM 2 STRL LF (GAUZE/BANDAGES/DRESSINGS) ×2 IMPLANT
BNDG ESMARK 4X9 LF (GAUZE/BANDAGES/DRESSINGS) ×3 IMPLANT
BNDG GAUZE ELAST 4 BULKY (GAUZE/BANDAGES/DRESSINGS) IMPLANT
CORDS BIPOLAR (ELECTRODE) ×3 IMPLANT
CUFF TOURNIQUET SINGLE 18IN (TOURNIQUET CUFF) ×3 IMPLANT
CUFF TOURNIQUET SINGLE 24IN (TOURNIQUET CUFF) IMPLANT
DRSG KUZMA FLUFF (GAUZE/BANDAGES/DRESSINGS) IMPLANT
DURAPREP 26ML APPLICATOR (WOUND CARE) ×3 IMPLANT
GAUZE SPONGE 4X4 12PLY STRL (GAUZE/BANDAGES/DRESSINGS) ×2 IMPLANT
GAUZE XEROFORM 1X8 LF (GAUZE/BANDAGES/DRESSINGS) ×3 IMPLANT
GLOVE BIO SURGEON STRL SZ 6.5 (GLOVE) ×2 IMPLANT
GLOVE BIO SURGEONS STRL SZ 6.5 (GLOVE) ×1
GLOVE SURG ORTHO 8.0 STRL STRW (GLOVE) ×3 IMPLANT
GOWN BRE IMP PREV XXLGXLNG (GOWN DISPOSABLE) ×3 IMPLANT
GOWN STRL REUS W/ TWL LRG LVL3 (GOWN DISPOSABLE) ×2 IMPLANT
GOWN STRL REUS W/ TWL XL LVL3 (GOWN DISPOSABLE) ×1 IMPLANT
GOWN STRL REUS W/TWL LRG LVL3 (GOWN DISPOSABLE) ×6
GOWN STRL REUS W/TWL XL LVL3 (GOWN DISPOSABLE) ×3
KIT BASIN OR (CUSTOM PROCEDURE TRAY) ×3 IMPLANT
KIT ROOM TURNOVER OR (KITS) ×3 IMPLANT
MANIFOLD NEPTUNE II (INSTRUMENTS) ×3 IMPLANT
NDL HYPO 25GX1X1/2 BEV (NEEDLE) IMPLANT
NEEDLE HYPO 25GX1X1/2 BEV (NEEDLE) ×3 IMPLANT
NS IRRIG 1000ML POUR BTL (IV SOLUTION) ×3 IMPLANT
PACK ORTHO EXTREMITY (CUSTOM PROCEDURE TRAY) ×3 IMPLANT
PAD ARMBOARD 7.5X6 YLW CONV (MISCELLANEOUS) ×6 IMPLANT
PAD CAST 4YDX4 CTTN HI CHSV (CAST SUPPLIES) IMPLANT
PADDING CAST COTTON 4X4 STRL (CAST SUPPLIES)
RUBBERBAND STERILE (MISCELLANEOUS) ×3 IMPLANT
SPECIMEN JAR SMALL (MISCELLANEOUS) ×3 IMPLANT
SPONGE SCRUB IODOPHOR (GAUZE/BANDAGES/DRESSINGS) ×3 IMPLANT
SUT ETHILON 5 0 PS 2 18 (SUTURE) IMPLANT
SUT SILK 4 0 PS 2 (SUTURE) IMPLANT
SUT VICRYL 4-0 PS2 18IN ABS (SUTURE) ×2 IMPLANT
SYR CONTROL 10ML LL (SYRINGE) ×2 IMPLANT
TOWEL OR 17X24 6PK STRL BLUE (TOWEL DISPOSABLE) ×3 IMPLANT
TOWEL OR 17X26 10 PK STRL BLUE (TOWEL DISPOSABLE) ×3 IMPLANT
UNDERPAD 30X30 INCONTINENT (UNDERPADS AND DIAPERS) ×3 IMPLANT
WATER STERILE IRR 1000ML POUR (IV SOLUTION) ×3 IMPLANT

## 2014-04-12 NOTE — ED Provider Notes (Signed)
73 year old female, presents with right middle finger laceration which occurred just prior to arrival when she got her hand stuck in a log splitter. 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  Medical screening examination/treatment/procedure(s) were conducted as a shared visit with non-physician practitioner(s) and myself.  I personally evaluated the patient during the encounter.        Johnna Acosta, MD 04/12/14 [phone]

## 2014-04-12 NOTE — Anesthesia Postprocedure Evaluation (Signed)
  Anesthesia Post-op Note  Patient: [name]  Procedure(s) Performed: Procedure(s): Revision AMPUTATION Right Long DIGIT (Right)  Patient Location: PACU  Anesthesia Type:General  Level of Consciousness: awake  Airway and Oxygen Therapy: Patient Spontanous Breathing  Post-op Pain: mild  Post-op Assessment: Post-op Vital signs reviewed  Post-op Vital Signs: Reviewed  Last Vitals:  Filed Vitals:   04/12/14 2256  BP: 153/58  Pulse: 62  Temp:   Resp: 17    Complications: No apparent anesthesia complications

## 2014-04-12 NOTE — H&P (Signed)
[name] is an 73 y.o. female.   Chief Complaint: right long finger amputation HPI: 73 yo female states she injured right long finger in a log splitter today.  Seen at [facility] and transferred to [facility] for further care.  Reports no previous injury to finger and no other injury at this time.  Past Medical History  Diagnosis Date  . Dysphagia   . CAD (coronary artery disease)   . MI (myocardial infarction)     2003 with one stent placement  . Hypertension     2002  . Hypertriglyceridemia   . COPD (chronic obstructive pulmonary disease)   . GERD (gastroesophageal reflux disease)   . Carotid artery disease   . Cervical disc disease   . PONV (postoperative nausea and vomiting)   . History of kidney stones   . Dysrhythmia     AFib    Past Surgical History  Procedure Laterality Date  . Cholecystectomy    . Total abdominal hysterectomy w/ bilateral salpingoophorectomy    . Shoulder surgery Right     torn rotator cuff  . Cardiac catheterization      with stent 2004  CYPHER 2.75 x 90mm coronary stent in the mid left anterior descending stenotic lesion    Last stress test showed EF of 775  . Right knee arthroscopy  2010  . Balloon dilation  05/05/2011    Procedure: BALLOON DILATION;  Surgeon: Rogene Houston, MD;  [facility];  Service: Endoscopy;  Laterality: N/A;  Venia Minks dilation  05/05/2011    Procedure: Venia Minks DILATION;  Surgeon: Rogene Houston, MD;  [facility];  Service: Endoscopy;  Laterality: N/A;  . Savory dilation  05/05/2011    Procedure: SAVORY DILATION;  Surgeon: Rogene Houston, MD;  [facility];  Service: Endoscopy;  Laterality: N/A;  . Coronary angioplasty      1 stent  . Abdominal hysterectomy    . Cervical disc surgery    . Cataract extraction Left 2002  . Cataract extraction w/phaco Right 05/14/2013    Procedure: CATARACT EXTRACTION PHACO AND INTRAOCULAR LENS PLACEMENT RIGHT EYE;  Surgeon: Tonny Branch, MD;  Location: [facility]  ORS;  Service: Ophthalmology;  Laterality: Right;  CDE:  20.45  . Cardioversion      Family History  Problem Relation Age of Onset  . Colon cancer Neg Hx   . Heart attack Father   . Heart disease Sister   . Heart disease Brother   . Hypertension Sister   . Diabetes Mother   . Heart failure Mother    Social History:  reports that she has been smoking Cigarettes.  She has a 56 pack-year smoking history. She does not have any smokeless tobacco history on file. She reports that she does not drink alcohol or use illicit drugs.  Allergies:  Allergies  Allergen Reactions  . Penicillins Anaphylaxis    Near death experience,   . Lipitor [Atorvastatin] Other (See Comments)    UNSPECIFIED.  Marland Kitchen Ceclor [Cefaclor] Nausea And Vomiting  . Codeine Nausea Only and Other (See Comments)    Cramps, nausea  . Sulfa Antibiotics Rash          (Not in a hospital admission)  No results found for this or any previous visit (from the past 48 hour(s)).  Dg Finger Middle Right  04/12/2014   CLINICAL DATA:  Initial encounter for middle finger laceration and pain after crush injury using wood splitter.  EXAM: RIGHT  MIDDLE FINGER 2+V  COMPARISON:  None.  FINDINGS: There is a cortical tuft fracture involving the tip of the middle finger distal phalanx. Given the degree of overlying soft tissue deformity, this is probably an open injury. No evidence for DIP joint dislocation.  IMPRESSION: Cortical tuft fracture involving the tip of the distal phalanx, likely representing an open injury.   Electronically Signed   By: Misty Stanley M.D.   On: 04/12/2014 15:35     A comprehensive review of systems was negative.  Blood pressure 184/62, pulse 70, temperature 98.4 F (36.9 C), temperature source Oral, resp. rate 18, height 5\' 8"  (1.727 m), weight 74.844 kg (165 lb), SpO2 92.00%.  General appearance: alert, cooperative and appears stated age Head: Normocephalic, without obvious abnormality, atraumatic Neck:  supple, symmetrical, trachea midline Resp: clear to auscultation bilaterally Cardio: regular rate and rhythm GI: non tender Extremities: intact sensation and capillary refill all digits.  +epl/fpl/io.  amputation of tip of right long finger.  no other wounds.   Pulses: 2+ and symmetric Skin: Skin color, texture, turgor normal. No rashes or lesions Neurologic: Grossly normal Incision/Wound: Right long fingertip amputation  Assessment/Plan Right long fingertip amputation.  Recommend OR for revision amputation.  Risks, benefits, and alternatives of surgery were discussed and the patient agrees with the plan of care.   Limmie Schoenberg R 04/12/2014, 8:03 PM

## 2014-04-12 NOTE — Anesthesia Preprocedure Evaluation (Signed)
Anesthesia Evaluation  Patient identified by MRN, date of birth, ID band Patient awake    Reviewed: Allergy & Precautions, H&P , NPO status , Patient's Chart, lab work & pertinent test results, reviewed documented beta blocker date and time   History of Anesthesia Complications (+) PONV and history of anesthetic complications  Airway       Dental  (+) Edentulous Upper, Partial Lower   Pulmonary shortness of breath, COPDCurrent Smoker,          Cardiovascular hypertension, Pt. on medications and Pt. on home beta blockers + CAD, + Past MI, + Cardiac Stents and + Peripheral Vascular Disease + dysrhythmias Atrial Fibrillation     Neuro/Psych    GI/Hepatic GERD-  Medicated and Controlled,  Endo/Other    Renal/GU      Musculoskeletal   Abdominal   Peds  Hematology   Anesthesia Other Findings   Reproductive/Obstetrics                           Anesthesia Physical Anesthesia Plan  ASA: III  Anesthesia Plan: General   Post-op Pain Management:    Induction: Intravenous  Airway Management Planned: Oral ETT  Additional Equipment:   Intra-op Plan:   Post-operative Plan: Extubation in OR  Informed Consent: I have reviewed the patients History and Physical, chart, labs and discussed the procedure including the risks, benefits and alternatives for the proposed anesthesia with the patient or authorized representative who has indicated his/her understanding and acceptance.   Dental advisory given  Plan Discussed with: Anesthesiologist and Surgeon  Anesthesia Plan Comments:         Anesthesia Quick Evaluation

## 2014-04-12 NOTE — Anesthesia Procedure Notes (Signed)
Procedure Name: LMA Insertion Date/Time: 04/12/2014 9:58 PM Performed by: Manuela Schwartz B Pre-anesthesia Checklist: Patient identified, Emergency Drugs available, Suction available, Patient being monitored and Timeout performed Patient Re-evaluated:Patient Re-evaluated prior to inductionOxygen Delivery Method: Circle system utilized Preoxygenation: Pre-oxygenation with 100% oxygen Intubation Type: IV induction LMA: LMA inserted LMA Size: 4.0 Number of attempts: 1 Tube secured with: Tape Dental Injury: Teeth and Oropharynx as per pre-operative assessment

## 2014-04-12 NOTE — ED Provider Notes (Signed)
CSN: [id number]     Arrival date & time 04/12/14  1457 History   First MD Initiated Contact with Patient 04/12/14 1539     Chief Complaint  Patient presents with  . Extremity Laceration     (Consider location/radiation/quality/duration/timing/severity/associated sxs/prior Treatment) The history is provided by the patient.   [name] is a 73 y.o. female presenting for evaluation of right long finger injury sustained when she crushed it in a wood splitter several hours ago.  She was seen by her pcp who referred her here for imaging and definitive treatment.  She has persistent pain and bleeding from the site.  She has kept it bandaged but it continues to bleed.  She is taking Pradaxa for anticoagulation secondary to history of MI.  Her tetanus is utd.  She has had no pain medication prior to arrival.     Past Medical History  Diagnosis Date  . Dysphagia   . CAD (coronary artery disease)   . MI (myocardial infarction)     2003 with one stent placement  . Hypertension     2002  . Hypertriglyceridemia   . COPD (chronic obstructive pulmonary disease)   . GERD (gastroesophageal reflux disease)   . Carotid artery disease   . Cervical disc disease   . PONV (postoperative nausea and vomiting)   . History of kidney stones   . Dysrhythmia     AFib   Past Surgical History  Procedure Laterality Date  . Cholecystectomy    . Total abdominal hysterectomy w/ bilateral salpingoophorectomy    . Shoulder surgery Right     torn rotator cuff  . Cardiac catheterization      with stent 2004  CYPHER 2.75 x 22mm coronary stent in the mid left anterior descending stenotic lesion    Last stress test showed EF of 775  . Right knee arthroscopy  2010  . Balloon dilation  05/05/2011    Procedure: BALLOON DILATION;  Surgeon: Rogene Houston, MD;  [facility];  Service: Endoscopy;  Laterality: N/A;  Venia Minks dilation  05/05/2011    Procedure: Venia Minks DILATION;  Surgeon: Rogene Houston, MD;  [facility];  Service: Endoscopy;  Laterality: N/A;  . Savory dilation  05/05/2011    Procedure: SAVORY DILATION;  Surgeon: Rogene Houston, MD;  [facility];  Service: Endoscopy;  Laterality: N/A;  . Coronary angioplasty      1 stent  . Abdominal hysterectomy    . Cervical disc surgery    . Cataract extraction Left 2002  . Cataract extraction w/phaco Right 05/14/2013    Procedure: CATARACT EXTRACTION PHACO AND INTRAOCULAR LENS PLACEMENT RIGHT EYE;  Surgeon: Tonny Branch, MD;  Location: [facility];  Service: Ophthalmology;  Laterality: Right;  CDE:  20.45  . Cardioversion     Family History  Problem Relation Age of Onset  . Colon cancer Neg Hx   . Heart attack Father   . Heart disease Sister   . Heart disease Brother   . Hypertension Sister   . Diabetes Mother   . Heart failure Mother    History  Substance Use Topics  . Smoking status: Current Every Day Smoker -- 1.00 packs/day for 56 years    Types: Cigarettes  . Smokeless tobacco: Not on file  . Alcohol Use: No     Comment: every 2-3 months   OB History   Grav Para Term Preterm Abortions TAB SAB Ect Mult Living  Review of Systems  Constitutional: Negative for fever.  Musculoskeletal: Positive for arthralgias. Negative for myalgias.  Skin: Positive for wound.  Neurological: Negative for weakness and numbness.      Allergies  Penicillins; Lipitor; Ceclor; Codeine; and Sulfa antibiotics  Home Medications   Prior to Admission medications   Medication Sig Start Date End Date Taking? Authorizing Provider  Acetaminophen (TYLENOL PO) Take 500-1,000 mg by mouth every 6 (six) hours as needed (pain).    Yes Historical Provider, MD  ADVAIR DISKUS 500-50 MCG/DOSE AEPB 1 puff 2 (two) times daily.  02/28/13  Yes Historical Provider, MD  albuterol (PROVENTIL) (2.5 MG/3ML) 0.083% nebulizer solution Take 2.5 mg by nebulization every 6 (six) hours as needed. For shortness of breath    Yes Historical Provider, MD  amiodarone (PACERONE) 200 MG tablet Take 200 mg by mouth daily. 01/07/14  Yes Minna Merritts, MD  Ascorbic Acid (VITAMIN C) 100 MG tablet Take 100 mg by mouth daily.    Yes Historical Provider, MD  aspirin 81 MG tablet Take 81 mg by mouth daily.     Yes Historical Provider, MD  cholecalciferol (VITAMIN D) 1000 UNITS tablet Take 1,000 Units by mouth daily.   Yes Historical Provider, MD  clonazePAM (KLONOPIN) 0.5 MG tablet Take 0.5 mg by mouth at bedtime as needed. For sleep   Yes Historical Provider, MD  furosemide (LASIX) 20 MG tablet Take 1 tablet (20 mg total) by mouth 2 (two) times daily as needed. 03/26/13  Yes Minna Merritts, MD  isosorbide mononitrate (IMDUR) 60 MG 24 hr tablet Take 60 mg by mouth daily.     Yes Historical Provider, MD  lisinopril-hydrochlorothiazide (PRINZIDE,ZESTORETIC) 20-25 MG per tablet Take 1 tablet by mouth daily.   Yes Historical Provider, MD  meloxicam (MOBIC) 15 MG tablet Take 1 tablet (15 mg total) by mouth daily. 01/18/14  Yes Minna Merritts, MD  metoprolol tartrate (LOPRESSOR) 25 MG tablet Take 25 mg by mouth 2 (two) times daily.   Yes Historical Provider, MD  omega-3 acid ethyl esters (LOVAZA) 1 G capsule Take 2 g by mouth 2 (two) times daily.     Yes Historical Provider, MD  pantoprazole (PROTONIX) 40 MG tablet Take 40 mg by mouth daily.   Yes Historical Provider, MD  potassium chloride (K-DUR) 10 MEQ tablet Take 1 tablet (10 mEq total) by mouth 2 (two) times daily as needed. 03/26/13  Yes Minna Merritts, MD  PRADAXA 150 MG CAPS capsule TAKE ONE CAPSULE EVERY 12 HOURS 03/21/14  Yes Minna Merritts, MD   BP 184/62  Pulse 70  Temp(Src) 98.4 F (36.9 C) (Oral)  Resp 18  Ht 5\' 8"  (1.727 m)  Wt 165 lb (74.844 kg)  BMI 25.09 kg/m2  SpO2 92% Physical Exam  Constitutional: She appears well-developed and well-nourished.  HENT:  Head: Atraumatic.  Neck: Normal range of motion.  Cardiovascular:  Pulses equal bilaterally    Musculoskeletal: She exhibits tenderness.       Hands: Neurological: She is alert. She has normal strength. She displays normal reflexes. No sensory deficit.  Skin: Skin is warm and dry.  Psychiatric: She has a normal mood and affect.    ED Course  NERVE BLOCK Date/Time: 04/12/2014 3:45 PM Performed by: Evalee Jefferson Authorized by: Evalee Jefferson Risks and benefits: risks, benefits and alternatives were discussed Consent given by: patient Patient identity confirmed: verbally with patient Time out: Immediately prior to procedure a "time out" was called to verify the correct  patient, procedure, equipment, support staff and site/side marked as required. Indications: pain relief Body area: upper extremity Nerve: digital Laterality: right Preparation: Patient was prepped and draped in the usual sterile fashion. Patient position: supine Needle gauge: 25 G Location technique: anatomical landmarks Local anesthetic: lidocaine 2% without epinephrine Anesthetic total: 2 ml Outcome: pain improved Patient tolerance: Patient tolerated the procedure well with no immediate complications.   (including critical care time) Labs Review Labs Reviewed - No data to display  Imaging Review Dg Finger Middle Right  04/12/2014   CLINICAL DATA:  Initial encounter for middle finger laceration and pain after crush injury using wood splitter.  EXAM: RIGHT MIDDLE FINGER 2+V  COMPARISON:  None.  FINDINGS: There is a cortical tuft fracture involving the tip of the middle finger distal phalanx. Given the degree of overlying soft tissue deformity, this is probably an open injury. No evidence for DIP joint dislocation.  IMPRESSION: Cortical tuft fracture involving the tip of the distal phalanx, likely representing an open injury.   Electronically Signed   By: Misty Stanley M.D.   On: 04/12/2014 15:35     EKG Interpretation None      MDM   Final diagnoses:  Open finger fracture, initial encounter    Patients  labs and/or radiological studies were viewed and considered during the medical decision making and disposition process. Pt seen by Dr. Sabra Heck during this visit.  Discussed case with Dr. Fredna Dow who reviewed xrays and injury photo.  Requested patient come to [facility] ed for definitive tx, advised pt of plan who was agreeable.  Husband will drive her. Advised npo.  Call to Dr. Audie Pinto to advise of patient pending arrival.  Also spoke with charge RN.      Evalee Jefferson, PA-C 04/12/14 2025

## 2014-04-12 NOTE — Op Note (Signed)
[id number] 

## 2014-04-12 NOTE — Discharge Instructions (Signed)
Finger Fracture Fractures of fingers are breaks in the bones of the fingers. There are many types of fractures. There are different ways of treating these fractures. Your health care provider will discuss the best way to treat your fracture. CAUSES Traumatic injury is the main cause of broken fingers. These include:  Injuries while playing sports.  Workplace injuries.  Falls. RISK FACTORS Activities that can increase your risk of finger fractures include:  Sports.  Workplace activities that involve machinery.  A condition called osteoporosis, which can make your bones less dense and cause them to fracture more easily. SIGNS AND SYMPTOMS The main symptoms of a broken finger are pain and swelling within 15 minutes after the injury. Other symptoms include:  Bruising of your finger.  Stiffness of your finger.  Numbness of your finger.  Exposed bones (compound fracture) if the fracture is severe. DIAGNOSIS  The best way to diagnose a broken bone is with X-ray imaging. Additionally, your health care provider will use this X-ray image to evaluate the position of the broken finger bones.  TREATMENT  Finger fractures can be treated with:   Nonreduction--This means the bones are in place. The finger is splinted without changing the positions of the bone pieces. The splint is usually left on for about a week to 10 days. This will depend on your fracture and what your health care provider thinks.  Closed reduction--The bones are put back into position without using surgery. The finger is then splinted.  Open reduction and internal fixation--The fracture site is opened. Then the bone pieces are fixed into place with pins or some type of hardware. This is seldom required. It depends on the severity of the fracture. HOME CARE INSTRUCTIONS   Follow your health care provider's instructions regarding activities, exercises, and physical therapy.  Only take over-the-counter or prescription  medicines for pain, discomfort, or fever as directed by your health care provider. SEEK MEDICAL CARE IF: You have pain or swelling that limits the motion or use of your fingers. SEEK IMMEDIATE MEDICAL CARE IF:  Your finger becomes numb. MAKE SURE YOU:   Understand these instructions.  Will watch your condition.  Will get help right away if you are not doing well or get worse. Document Released: 10/03/2000 Document Revised: 04/11/2013 Document Reviewed: 01/31/2013 [facility] Patient Information 2015 [place], [place]. This information is not intended to replace advice given to you by your health care provider. Make sure you discuss any questions you have with your health care provider.   [facility] Instructions Hand Surgery  Wound Care: Keep your hand elevated above the level of your heart.  Do not allow it to dangle by your side.  Keep the dressing dry and do not remove it unless your doctor advises you to do so.  He will usually change it at the time of your post-op visit.  Moving your fingers is advised to stimulate circulation but will depend on the site of your surgery.  If you have a splint applied, your doctor will advise you regarding movement.  Activity: Do not drive or operate machinery today.  Rest today and then you may return to your normal activity and work as indicated by your physician.  Diet:  Drink liquids today or eat a light diet.  You may resume a regular diet tomorrow.    General expectations: Pain for two to three days. Fingers may become slightly swollen.  Call your doctor if any of the following occur: Severe pain not relieved by  pain medication. Elevated temperature. Dressing soaked with blood. Inability to move fingers. White or bluish color to fingers.

## 2014-04-12 NOTE — ED Notes (Signed)
Laceration to tip of right middle finger

## 2014-04-12 NOTE — ED Notes (Signed)
RMF mashed in log splitter 2 hours ago,

## 2014-04-12 NOTE — ED Notes (Signed)
Dr Kuzma at bedside. 

## 2014-04-12 NOTE — Transfer of Care (Signed)
Immediate Anesthesia Transfer of Care Note  Patient: [name]  Procedure(s) Performed: Procedure(s): Revision AMPUTATION Right Long DIGIT (Right)  Patient Location: PACU  Anesthesia Type:General  Level of Consciousness: awake, alert , oriented and patient cooperative  Airway & Oxygen Therapy: Patient Spontanous Breathing and Patient connected to nasal cannula oxygen  Post-op Assessment: Report given to PACU RN and Post -op Vital signs reviewed and stable  Post vital signs: Reviewed and stable  Complications: No apparent anesthesia complications

## 2014-04-12 NOTE — ED Notes (Signed)
The pt has a laceration to the rt middle finger on a wood splitter  Earlier today.  No bleeding on the bandage.  She was sent here from [place] to meet dr Fredna Dow.  She had a lidocaine injection there and xrays.

## 2014-04-12 NOTE — Brief Op Note (Signed)
04/12/2014  10:37 PM  PATIENT:  [name]  73 y.o. female  PRE-OPERATIVE DIAGNOSIS:  Traumatic Amputation Right Long Finger  POST-OPERATIVE DIAGNOSIS:  Traumatic Amputation Right Long Finger  PROCEDURE:  Procedure(s): Revision AMPUTATION Right Long DIGIT (Right)  SURGEON:  Surgeon(s) and Role:    * Leanora Cover, MD - Primary  PHYSICIAN ASSISTANT:   ASSISTANTS: none   ANESTHESIA:   general  EBL:     BLOOD ADMINISTERED:none  DRAINS: none   LOCAL MEDICATIONS USED:  MARCAINE     SPECIMEN:  No Specimen  DISPOSITION OF SPECIMEN:  N/A  COUNTS:  YES  TOURNIQUET:   Total Tourniquet Time Documented: Upper Arm (Right) - 19 minutes Total: Upper Arm (Right) - 19 minutes   DICTATION: .Other Dictation: Dictation Number [id number]  PLAN OF CARE: Discharge to home after PACU  PATIENT DISPOSITION:  PACU - hemodynamically stable.

## 2014-04-13 NOTE — Op Note (Signed)
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  PROCEDURE:  Revision amputation of right long finger.  SURGEON:  Leanora Cover, MD  ASSISTANT:  None.  ANESTHESIA:  General.  IV FLUIDS:  Per anesthesia flow sheet.  ESTIMATED BLOOD LOSS:  Minimal.  COMPLICATIONS:  None.  SPECIMENS:  None.  TOURNIQUET TIME:  19 minutes.  DISPOSITION:  Stable to PACU.  INDICATIONS:  [name] is a 73 year old female who states she injured her right long finger while using a log splitter.  She was seen at [facility] Emergency Department and transferred to [facility] for further care.  On examination, she had loss of the tip of the right long finger.  No other wounds were noted.  The nail had been avulsed.  I recommended going to the operating room for revision amputation of right long finger.  Risks, benefits and alternatives of surgery were discussed including the risk of blood loss; infection; damage to nerves, vessels, tendons, ligaments, bone; failure of surgery; need for additional surgery; complications with wound healing; continued pain; need for further surgery.  She voiced understanding of these risks and elected to proceed.  OPERATIVE COURSE:  After being identified preoperatively by myself, the patient and I agreed upon the procedure and site of procedure.  Surgical site was marked.  Risks, benefits, and alternatives of the surgery were reviewed and she wished to proceed.  Surgical consent had been signed. She was given IV vancomycin  as preoperative antibiotic prophylaxis due to PENICILLIN allergy.  She was transferred to the operating room and placed on the operating room table in supine position with the right upper extremity on an armboard.  General anesthesia was induced by Anesthesiology.  Right upper extremity was prepped and draped in normal sterile orthopedic fashion.  A surgical pause was performed between the surgeons, anesthesia, and operating room staff, and all were in agreement as to the patient, procedure, and site of procedure. Tourniquet at the proximal aspect of the extremity was inflated to 250 mmHg after exsanguination of the limb with an Esmarch bandage.  The wound was debrided.  There was dark, greasy substance, which was removed.  The wound was copiously irrigated with a 1000 mL of sterile saline by bulb syringe.  Once the wound had been debrided, the exposed bone was shortened using the rongeurs.  Any devitalized bone was removed as well.  Nail bed was intact proximally, but distally had been abraded. There still bony coverage.  The bone was shortened so that the soft tissues of the pulp could be brought over the end of the bone.  A 6-0 chromic suture was used to secure the soft tissues over the end of the bone to the  nail bed and 5-0 Monocryl suture was used in the skin edges on the sides.  This provided good soft tissue coverage of the bone. Good contour was achieved.  The wound was dressed with sterile Xeroform and a piece of Xeroform placed in the nail fold.  The wound was then dressed with sterile 4x4 and wrapped with a Coban dressing lightly.  An Alumafoam splint was placed and wrapped with the Coban dressing lightly. Tourniquet was deflated at 19 minutes.  Fingertips were pink with brisk capillary refill after deflation of the tourniquet.  The operative drapes were broken down and the patient was awoken from anesthesia safely.  She was transferred back to the stretcher and taken to PACU  in stable condition.  I will see her back in the office in 1 week for postoperative followup.  Due to multiple medication allergies, she will use Tylenol as needed for pain control, which she states she as all has been able to use in the past.  Digital block had been performed with long-acting Marcaine.     Leanora Cover, MD     KK/MEDQ  D:  04/12/2014  T:  04/13/2014  Job:  [id number]

## 2014-04-13 NOTE — ED Provider Notes (Signed)
Medical screening examination/treatment/procedure(s) were performed by non-physician practitioner and as supervising physician I was immediately available for consultation/collaboration.    Johnna Acosta, MD 04/13/14 [phone]

## 2014-04-15 ENCOUNTER — Encounter (HOSPITAL_COMMUNITY): Payer: Self-pay | Admitting: Orthopedic Surgery

## 2014-04-16 NOTE — Addendum Note (Signed)
Addendum created 04/16/14 1718 by Finis Bud, MD   Modules edited: Anesthesia Attestations

## 2014-07-17 ENCOUNTER — Ambulatory Visit: Payer: Medicare HMO | Admitting: Cardiovascular Disease

## 2014-07-22 ENCOUNTER — Other Ambulatory Visit: Payer: Self-pay | Admitting: Cardiovascular Disease

## 2014-07-30 ENCOUNTER — Encounter: Payer: Self-pay | Admitting: Cardiovascular Disease

## 2014-07-30 ENCOUNTER — Ambulatory Visit (INDEPENDENT_AMBULATORY_CARE_PROVIDER_SITE_OTHER): Payer: Medicare Other | Admitting: Cardiovascular Disease

## 2014-07-30 VITALS — BP 140/60 | HR 55 | Ht 67.5 in | Wt 185.5 lb

## 2014-07-30 DIAGNOSIS — E785 Hyperlipidemia, unspecified: Secondary | ICD-10-CM

## 2014-07-30 DIAGNOSIS — R5382 Chronic fatigue, unspecified: Secondary | ICD-10-CM

## 2014-07-30 DIAGNOSIS — Z720 Tobacco use: Secondary | ICD-10-CM

## 2014-07-30 DIAGNOSIS — F17200 Nicotine dependence, unspecified, uncomplicated: Secondary | ICD-10-CM

## 2014-07-30 DIAGNOSIS — I4891 Unspecified atrial fibrillation: Secondary | ICD-10-CM

## 2014-07-30 DIAGNOSIS — R0602 Shortness of breath: Secondary | ICD-10-CM

## 2014-07-30 DIAGNOSIS — I2510 Atherosclerotic heart disease of native coronary artery without angina pectoris: Secondary | ICD-10-CM

## 2014-07-30 DIAGNOSIS — R0789 Other chest pain: Secondary | ICD-10-CM

## 2014-07-30 MED ORDER — TRAZODONE HCL 50 MG PO TABS: 50.0000 mg | ORAL_TABLET | Freq: Every day | ORAL | Status: DC

## 2014-07-30 NOTE — Assessment & Plan Note (Signed)
I suspect her fatigue is from lack of sleep. We have given her a prescription for trazodone 50 mg daily at bedtime to take with her clonazepam

## 2014-07-30 NOTE — Assessment & Plan Note (Signed)
We have encouraged her to continue to work on weaning her cigarettes and smoking cessation. She will continue to work on this and does not want any assistance with chantix.  

## 2014-07-30 NOTE — Assessment & Plan Note (Signed)
1 recent episode of chest discomfort when laying supine in bed after dinner. Suspect indigestion though recommended she call us if she has recurrent symptoms

## 2014-07-30 NOTE — Assessment & Plan Note (Signed)
Unable to tolerate a statin secondary to muscle cramping

## 2014-07-30 NOTE — Patient Instructions (Addendum)
You are doing well. Please try trazodone for sleep with clonazepam Ok to double trazodone if needed   Please call us if you have new issues that need to be addressed before your next appt.  Your physician wants you to follow-up in: 6 months.  You will receive a reminder letter in the mail two months in advance. If you don't receive a letter, please call our office to schedule the follow-up appointment.

## 2014-07-30 NOTE — Assessment & Plan Note (Signed)
Maintaining normal sinus rhythm. Bradycardia on today's visit. She denies having any symptoms and states heart rate is 55-60 at home. She has been taking metoprolol 25 mill grams twice a day with no symptoms from her bradycardia

## 2014-07-30 NOTE — Assessment & Plan Note (Signed)
Currently with no symptoms of angina. No further workup at this time. Continue current medication regimen. 

## 2014-07-30 NOTE — Progress Notes (Signed)
Patient ID: [name], female    DOB: 1941/05/30, 74 y.o.   MRN: [id number]  HPI Comments: Ms. 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 In September 2014 she had CHF symptoms, found to be in atrial fibrillation. She was started on anticoagulation, given Lasix.  Status post cardioversion after she failed to convert to normal sinus rhythm on amiodarone. Cardioversion 05/25/2013 which was successful in restoring normal sinus rhythm. She had converted from atrial fibrillation to atrial flutter at the time of the cardioversion.  In follow-up today, she reports having an episode of chest pain one or 2 weeks ago when lying down. Symptoms resolved after taking medications for indigestion. No further symptoms. She continues to smoke. Significant fatigue, not sleeping well despite taking clonazepam. She takes Lasix twice per week, typically as needed for ankle edema. She is napping in the daytime, no regular activity. Unable to tolerate statins in the past secondary to cramping She is tolerating anticoagulation, no bleeding  EKG on today's visit shows sinus bradycardia with rate 55 bpm, nonspecific ST abnormality  Other past medical history chronic neck pain, shoulder pain. She typically takes meloxicam when necessary She reports having a ruptured disc in her neck.     Allergies  Allergen Reactions  . Penicillins Anaphylaxis    Near death experience,   . Lipitor [Atorvastatin] Other (See Comments)    UNSPECIFIED.  Marland Kitchen Ceclor [Cefaclor] Nausea And Vomiting  . Codeine Nausea Only and Other (See Comments)    Cramps, nausea  . Sulfa Antibiotics Rash          Outpatient Encounter Prescriptions as of 07/30/2014  Medication Sig  . Acetaminophen (TYLENOL PO) Take 500-1,000 mg by mouth every 6 (six) hours as needed (pain).   . ADVAIR DISKUS 500-50 MCG/DOSE AEPB 1 puff 2 (two) times daily.   Marland Kitchen albuterol (PROVENTIL) (2.5 MG/3ML) 0.083% nebulizer solution Take 2.5 mg by nebulization every 6 (six) hours as needed. For shortness of breath  . amiodarone (PACERONE) 200 MG tablet Take 200 mg by mouth daily.  . Ascorbic Acid (VITAMIN C) 100 MG tablet Take 100 mg by mouth daily.   Marland Kitchen aspirin 81 MG tablet Take 81 mg by mouth daily.    . cholecalciferol (VITAMIN D) 1000 UNITS tablet Take 1,000 Units by mouth daily.  . clonazePAM (KLONOPIN) 0.5 MG tablet Take 0.5 mg by mouth at bedtime as needed. For sleep  . furosemide (LASIX) 20 MG tablet Take 1 tablet (20 mg total) by mouth 2 (two) times daily as needed.  . isosorbide mononitrate (IMDUR) 60 MG 24 hr tablet Take 60 mg by mouth daily.    Marland Kitchen lisinopril-hydrochlorothiazide (PRINZIDE,ZESTORETIC) 20-25 MG per tablet Take 1 tablet by mouth daily.  . meloxicam (MOBIC) 15 MG tablet Take 1 tablet (15 mg total) by mouth daily.  . metoprolol tartrate (LOPRESSOR) 25 MG tablet Take 25 mg by mouth 2 (two) times daily.  Marland Kitchen omega-3 acid ethyl esters (LOVAZA) 1 G capsule Take 2 g by mouth 2 (two) times daily.    . pantoprazole (PROTONIX) 40 MG tablet Take 40 mg by mouth daily.  . potassium chloride (K-DUR) 10 MEQ tablet  Take 1 tablet (10 mEq total) by mouth 2 (two) times daily as needed.  Marland Kitchen PRADAXA 150 MG CAPS capsule TAKE ONE CAPSULE EVERY 12 HOURS    Past Medical History  Diagnosis Date  . Dysphagia   . CAD (coronary artery disease)   . MI (myocardial infarction)     2003 with one stent placement  . Hypertension     2002  . Hypertriglyceridemia   . COPD (chronic obstructive pulmonary disease)   . GERD (gastroesophageal reflux disease)   . Carotid artery disease   . Cervical disc disease   . PONV (postoperative  nausea and vomiting)   . History of kidney stones   . Dysrhythmia     AFib    Past Surgical History  Procedure Laterality Date  . Cholecystectomy    . Total abdominal hysterectomy w/ bilateral salpingoophorectomy    . Shoulder surgery Right     torn rotator cuff  . Cardiac catheterization      with stent 2004  CYPHER 2.75 x 41mm coronary stent in the mid left anterior descending stenotic lesion    Last stress test showed EF of 775  . Right knee arthroscopy  2010  . Balloon dilation  05/05/2011    Procedure: BALLOON DILATION;  Surgeon: Rogene Houston, MD;  Location: [facility];  Service: Endoscopy;  Laterality: N/A;  Venia Minks dilation  05/05/2011    Procedure: Venia Minks DILATION;  Surgeon: Rogene Houston, MD;  Location: [facility];  Service: Endoscopy;  Laterality: N/A;  . Savory dilation  05/05/2011    Procedure: SAVORY DILATION;  Surgeon: Rogene Houston, MD;  Location: [facility];  Service: Endoscopy;  Laterality: N/A;  . Coronary angioplasty      1 stent  . Abdominal hysterectomy    . Cervical disc surgery    . Cataract extraction Left 2002  . Cataract extraction w/phaco Right 05/14/2013    Procedure: CATARACT EXTRACTION PHACO AND INTRAOCULAR LENS PLACEMENT RIGHT EYE;  Surgeon: Tonny Branch, MD;  Location: [facility];  Service: Ophthalmology;  Laterality: Right;  CDE:  20.45  . Cardioversion    . Amputation Right 04/12/2014    Procedure: Revision AMPUTATION Right Long DIGIT;  Surgeon: Leanora Cover, MD;  Location: [facility];  Service: Orthopedics;  Laterality: Right;    Social History  reports that she has been smoking Cigarettes.  She has a 56 pack-year smoking history. She does not have any smokeless tobacco history on file. She reports that she does not drink alcohol or use illicit drugs.  Family History family history includes Diabetes in her mother; Heart attack in her father; Heart disease in her brother and sister; Heart failure in her mother; Hypertension in her sister.  There is no history of Colon cancer.   Review of Systems  Constitutional: Positive for fatigue.  Eyes: Negative.   Respiratory: Negative.   Cardiovascular: Positive for chest pain.  Gastrointestinal: Negative.   Endocrine: Negative.   Musculoskeletal: Positive for arthralgias and neck pain.  Neurological: Negative.   Hematological: Negative.   Psychiatric/Behavioral: Negative.   All other systems reviewed and are negative.   BP 160/70 mmHg  Pulse 55  Ht 5' 7.5" (1.715 m)  Wt 185 lb 8 oz (84.142 kg)  BMI 28.61 kg/m2  Physical Exam  Constitutional: She is oriented to person, place, and time. She appears well-developed and well-nourished.  HENT:  Head: Normocephalic.  Nose: Nose normal.  Mouth/Throat: Oropharynx is clear and moist.  Eyes: Conjunctivae  are normal. Pupils are equal, round, and reactive to light.  Neck: Normal range of motion. Neck supple. No JVD present.  Cardiovascular: Normal rate, regular rhythm, S1 normal, S2 normal, normal heart sounds and intact distal pulses.  Exam reveals no gallop and no friction rub.   No murmur heard. Pulmonary/Chest: Effort normal and breath sounds normal. No respiratory distress. She has no wheezes. She has no rales. She exhibits no tenderness.  Abdominal: Soft. Bowel sounds are normal. She exhibits no distension. There is no tenderness.  Musculoskeletal: Normal range of motion. She exhibits no edema or tenderness.  Lymphadenopathy:    She has no cervical adenopathy.  Neurological: She is alert and oriented to person, place, and time. Coordination normal.  Skin: Skin is warm and dry. No rash noted. No erythema.  Psychiatric: She has a normal mood and affect. Her behavior is normal. Judgment and thought content normal.    Assessment and Plan  Nursing note and vitals reviewed.

## 2014-08-07 ENCOUNTER — Other Ambulatory Visit: Payer: Self-pay | Admitting: Cardiovascular Disease

## 2014-08-29 ENCOUNTER — Telehealth: Payer: Self-pay | Admitting: *Deleted

## 2014-08-29 NOTE — Telephone Encounter (Signed)
[facility] pharmacy calling needing a prior auth on medication pradaxa. Not script.  Pt is out does need samples for this. Please advise.

## 2014-09-02 NOTE — Telephone Encounter (Signed)
Pt provided w/ samples 08/29/14.

## 2014-09-10 ENCOUNTER — Emergency Department: Payer: Self-pay | Admitting: Emergency Medicine

## 2014-09-12 ENCOUNTER — Telehealth: Payer: Self-pay

## 2014-09-12 NOTE — Telephone Encounter (Signed)
Should be fine as amiodarone dose is very small If she is concerned, could cut the amiodarone dose down to 100 mg daily, back up to full dose in several days' time Typically is not an issue

## 2014-09-12 NOTE — Telephone Encounter (Signed)
Spoke w/ pharmacist Tammy.  She reports that pt was seen in [facility] ED on 09/10/14 and prescribed a z-pak. There is a "serious interaction" warning w/ amio & z-pak advising of serious or life-threatening interaction. She states that the ED docs are difficult to track down and she trusts Dr. Donivan Scull opinion on the matter.  She would like to know if she should proceed w/ filling rx or if he recommends an alternative abx.

## 2014-09-12 NOTE — Telephone Encounter (Signed)
Pharmacist states pt is taking a Z-Pak and it is interacting with Amiodarone. Please call.

## 2014-09-13 ENCOUNTER — Telehealth: Payer: Self-pay | Admitting: *Deleted

## 2014-09-13 NOTE — Telephone Encounter (Signed)
Notified patient samples of pradaxa available to pick up.

## 2014-09-13 NOTE — Telephone Encounter (Signed)
Spoke w/ Tammy. Advised her of Dr. Donivan Scull recommendation.  She is agreeable and will call back w/ any further questions or concerns.

## 2014-09-13 NOTE — Telephone Encounter (Signed)
Pt needs samples of pradaxa  Has enough to last until Monday,.   Please call and let her know when ready.

## 2014-09-24 ENCOUNTER — Telehealth: Payer: Self-pay

## 2014-09-24 NOTE — Telephone Encounter (Signed)
Notified patient Pradaxa is approved throught 09/24/2015 per [employer].

## 2014-12-31 ENCOUNTER — Other Ambulatory Visit (HOSPITAL_COMMUNITY): Payer: Self-pay | Admitting: Family Medicine

## 2014-12-31 DIAGNOSIS — Z1231 Encounter for screening mammogram for malignant neoplasm of breast: Secondary | ICD-10-CM

## 2015-01-16 ENCOUNTER — Ambulatory Visit (HOSPITAL_COMMUNITY): Payer: Commercial Managed Care - HMO

## 2015-01-27 ENCOUNTER — Ambulatory Visit (INDEPENDENT_AMBULATORY_CARE_PROVIDER_SITE_OTHER): Payer: Medicare Other | Admitting: Cardiovascular Disease

## 2015-01-27 ENCOUNTER — Encounter: Payer: Self-pay | Admitting: Cardiovascular Disease

## 2015-01-27 VITALS — BP 172/70 | HR 48 | Ht 67.5 in | Wt 184.8 lb

## 2015-01-27 DIAGNOSIS — I10 Essential (primary) hypertension: Secondary | ICD-10-CM

## 2015-01-27 DIAGNOSIS — E039 Hypothyroidism, unspecified: Secondary | ICD-10-CM | POA: Insufficient documentation

## 2015-01-27 DIAGNOSIS — I2510 Atherosclerotic heart disease of native coronary artery without angina pectoris: Secondary | ICD-10-CM | POA: Diagnosis not present

## 2015-01-27 DIAGNOSIS — R079 Chest pain, unspecified: Secondary | ICD-10-CM

## 2015-01-27 DIAGNOSIS — Z720 Tobacco use: Secondary | ICD-10-CM

## 2015-01-27 DIAGNOSIS — R5382 Chronic fatigue, unspecified: Secondary | ICD-10-CM | POA: Diagnosis not present

## 2015-01-27 DIAGNOSIS — F17200 Nicotine dependence, unspecified, uncomplicated: Secondary | ICD-10-CM

## 2015-01-27 DIAGNOSIS — I4891 Unspecified atrial fibrillation: Secondary | ICD-10-CM

## 2015-01-27 DIAGNOSIS — E785 Hyperlipidemia, unspecified: Secondary | ICD-10-CM

## 2015-01-27 MED ORDER — AMIODARONE HCL 200 MG PO TABS: 200.0000 mg | ORAL_TABLET | Freq: Every day | ORAL | Status: DC

## 2015-01-27 MED ORDER — AMLODIPINE BESYLATE 10 MG PO TABS: 10.0000 mg | ORAL_TABLET | Freq: Every day | ORAL | Status: DC

## 2015-01-27 NOTE — Assessment & Plan Note (Signed)
Chronic fatigue symptoms, now with fiber disease, bradycardia. Thyroid managed by primary care. We'll decrease her medications as above for bradycardia

## 2015-01-27 NOTE — Assessment & Plan Note (Signed)
We have encouraged her to continue to work on weaning her cigarettes and smoking cessation. She will continue to work on this and does not want any assistance with chantix.  

## 2015-01-27 NOTE — Assessment & Plan Note (Signed)
High risk of CAD given statin intolerance, continued smoking Currently with no symptoms of angina

## 2015-01-27 NOTE — Assessment & Plan Note (Signed)
Blood pressures running high today. Recommended she start amlodipine 5 mg daily. If blood pressure continues to run high would increase up to 10 mg daily. She used to be on this medication

## 2015-01-27 NOTE — Assessment & Plan Note (Signed)
Unable to tolerate statins in the past. Could consider zetia when this goes generic

## 2015-01-27 NOTE — Assessment & Plan Note (Signed)
TSH of 18. We'll decrease her amiodarone dosing

## 2015-01-27 NOTE — Assessment & Plan Note (Signed)
Sinus bradycardia on today's visit. We will decrease the amiodarone down to 100 mg daily, also in light of her thyroid disease Decrease metoprolol down to 12.5 mg twice a day Suggested she monitor her heart rate

## 2015-01-27 NOTE — Progress Notes (Signed)
Patient ID: [name], female    DOB: January 07, 1941, 74 y.o.   MRN: [id number]  HPI Comments: 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 In September 2014 she had CHF symptoms, found to be in atrial fibrillation. She was started on anticoagulation, given Lasix.  Status post cardioversion after she failed to convert to normal sinus rhythm on amiodarone. Cardioversion 05/25/2013 which was successful in restoring normal sinus rhythm. She had converted from atrial fibrillation to atrial flutter at the time of the cardioversion.  In follow-up today, she reports feeling tired, legs are weak, feels like she is falling. She wonders if it could be from her thyroid Most recent TSH of 18, started on thyroid supplement medication  No recent anginal symptoms She continues to smoke Denies any tachycardia concerning for atrial fibrillation. Heartbeat has been running slow  Lab work reviewed with her from May 2016 showing TSH 18.8, total cholesterol 170, LDL 97 Currently not on aspirin Unable to tolerate statins  EKG on today's visit shows sinus bradycardia with rate 48 bpm, nonspecific ST abnormality anterolateral leads  Other past medical history She takes Lasix twice per week, typically as needed for ankle edema. She is napping in the daytime, no regular activity. She is tolerating anticoagulation, no bleeding chronic neck pain, shoulder pain. She typically takes meloxicam when necessary She reports having a ruptured disc in her neck.     Allergies  Allergen Reactions  . Penicillins Anaphylaxis    Near death experience,   . Lipitor [Atorvastatin] Other (See  Comments)    UNSPECIFIED.  Marland Kitchen Ceclor [Cefaclor] Nausea And Vomiting  . Codeine Nausea Only and Other (See Comments)    Cramps, nausea  . Sulfa Antibiotics Rash         Outpatient Encounter Prescriptions as of 01/27/2015  Medication Sig  . Acetaminophen (TYLENOL PO) Take 500-1,000 mg by mouth every 6 (six) hours as needed (pain).   . ADVAIR DISKUS 500-50 MCG/DOSE AEPB 1 puff 2 (two) times daily.   Marland Kitchen albuterol (PROVENTIL) (2.5 MG/3ML) 0.083% nebulizer solution Take 2.5 mg by nebulization every 6 (six) hours as needed. For shortness of breath  . amiodarone (PACERONE) 200 MG tablet Take 1 tablet (200 mg total) by mouth daily.  . Ascorbic Acid (VITAMIN C) 100 MG tablet Take 100 mg by mouth daily.   . cholecalciferol (VITAMIN D) 1000 UNITS tablet Take 1,000 Units by mouth daily.  . clonazePAM (KLONOPIN) 0.5 MG tablet Take 0.5 mg by mouth at bedtime as needed. For sleep  . furosemide (LASIX) 20 MG tablet Take 1 tablet (20 mg total) by mouth 2 (two) times daily as needed.  . isosorbide mononitrate (IMDUR) 60 MG 24 hr tablet Take 60 mg by mouth daily.    Marland Kitchen levothyroxine (SYNTHROID, LEVOTHROID) 50 MCG tablet Take 1 tablet (50 mcg total) by mouth daily.  Marland Kitchen lisinopril-hydrochlorothiazide (PRINZIDE,ZESTORETIC) 20-25 MG per tablet Take 1 tablet by mouth daily.  . meloxicam (MOBIC) 15 MG tablet Take 1 tablet (15 mg total) by mouth daily.  . metoprolol tartrate (LOPRESSOR) 25 MG tablet Take 25 mg by mouth 2 (two) times daily.  Marland Kitchen omega-3 acid ethyl  esters (LOVAZA) 1 G capsule Take 2 g by mouth 2 (two) times daily.    . pantoprazole (PROTONIX) 40 MG tablet Take 40 mg by mouth daily.  . potassium chloride (K-DUR) 10 MEQ tablet Take 1 tablet (10 mEq total) by mouth 2 (two) times daily as needed.  Marland Kitchen PRADAXA 150 MG CAPS capsule TAKE ONE CAPSULE EVERY 12 HOURS  . traZODone (DESYREL) 50 MG tablet Take 1 tablet (50 mg total) by mouth at bedtime.  . [DISCONTINUED] amiodarone (PACERONE) 200 MG tablet Take 200 mg by  mouth daily.  . [DISCONTINUED] levothyroxine (SYNTHROID, LEVOTHROID) 25 MCG tablet Take 25 mcg by mouth daily.   Marland Kitchen amLODipine (NORVASC) 10 MG tablet Take 1 tablet (10 mg total) by mouth daily.  . [DISCONTINUED] amiodarone (PACERONE) 200 MG tablet TAKE ONE TABLET BY MOUTH TWICE A DAY (Patient not taking: Reported on 01/27/2015)  . [DISCONTINUED] aspirin 81 MG tablet Take 81 mg by mouth daily.     No facility-administered encounter medications on file as of 01/27/2015.    Past Medical History  Diagnosis Date  . Dysphagia   . CAD (coronary artery disease)   . MI (myocardial infarction)     2003 with one stent placement  . Hypertension     2002  . Hypertriglyceridemia   . COPD (chronic obstructive pulmonary disease)   . GERD (gastroesophageal reflux disease)   . Carotid artery disease   . Cervical disc disease   . PONV (postoperative nausea and vomiting)   . History of kidney stones   . Dysrhythmia     AFib    Past Surgical History  Procedure Laterality Date  . Cholecystectomy    . Total abdominal hysterectomy w/ bilateral salpingoophorectomy    . Shoulder surgery Right     torn rotator cuff  . Cardiac catheterization      with stent 2004  CYPHER 2.75 x 69mm coronary stent in the mid left anterior descending stenotic lesion    Last stress test showed EF of 775  . Right knee arthroscopy  2010  . Balloon dilation  05/05/2011    Procedure: BALLOON DILATION;  Surgeon: Rogene Houston, MD;  [facility];  Service: Endoscopy;  Laterality: N/A;  Venia Minks dilation  05/05/2011    Procedure: Venia Minks DILATION;  Surgeon: Rogene Houston, MD;  [facility];  Service: Endoscopy;  Laterality: N/A;  . Savory dilation  05/05/2011    Procedure: SAVORY DILATION;  Surgeon: Rogene Houston, MD;  [facility];  Service: Endoscopy;  Laterality: N/A;  . Coronary angioplasty      1 stent  . Abdominal hysterectomy    . Cervical disc surgery    . Cataract extraction  Left 2002  . Cataract extraction w/phaco Right 05/14/2013    Procedure: CATARACT EXTRACTION PHACO AND INTRAOCULAR LENS PLACEMENT RIGHT EYE;  Surgeon: Tonny Branch, MD;  Location: [facility];  Service: Ophthalmology;  Laterality: Right;  CDE:  20.45  . Cardioversion    . Amputation Right 04/12/2014    Procedure: Revision AMPUTATION Right Long DIGIT;  Surgeon: Leanora Cover, MD;  Location: [place];  Service: Orthopedics;  Laterality: Right;    Social History  reports that she has been smoking Cigarettes.  She has a 56 pack-year smoking history. She does not have any smokeless tobacco history on file. She reports that she does not drink alcohol or use illicit drugs.  Family History family history includes Diabetes in her mother; Heart attack in  her father; Heart disease in her brother and sister; Heart failure in her mother; Hypertension in her sister. There is no history of Colon cancer.   Review of Systems  Constitutional: Positive for fatigue.  Eyes: Negative.   Respiratory: Negative.   Cardiovascular: Negative.   Gastrointestinal: Negative.   Endocrine: Negative.   Musculoskeletal: Positive for arthralgias.  Neurological: Negative.   Hematological: Negative.   Psychiatric/Behavioral: Negative.   All other systems reviewed and are negative.   BP 172/70 mmHg  Pulse 48  Ht 5' 7.5" (1.715 m)  Wt 184 lb 12 oz (83.802 kg)  BMI 28.49 kg/m2  Physical Exam  Constitutional: She is oriented to person, place, and time. She appears well-developed and well-nourished.  HENT:  Head: Normocephalic.  Nose: Nose normal.  Mouth/Throat: Oropharynx is clear and moist.  Eyes: Conjunctivae are normal. Pupils are equal, round, and reactive to light.  Neck: Normal range of motion. Neck supple. No JVD present.  Cardiovascular: Normal rate, regular rhythm, S1 normal, S2 normal, normal heart sounds and intact distal pulses.  Exam reveals no gallop and no friction rub.   No murmur heard. Pulmonary/Chest: Effort  normal and breath sounds normal. No respiratory distress. She has no wheezes. She has no rales. She exhibits no tenderness.  Abdominal: Soft. Bowel sounds are normal. She exhibits no distension. There is no tenderness.  Musculoskeletal: Normal range of motion. She exhibits no edema or tenderness.  Lymphadenopathy:    She has no cervical adenopathy.  Neurological: She is alert and oriented to person, place, and time. Coordination normal.  Skin: Skin is warm and dry. No rash noted. No erythema.  Psychiatric: She has a normal mood and affect. Her behavior is normal. Judgment and thought content normal.    Assessment and Plan  Nursing note and vitals reviewed.

## 2015-01-27 NOTE — Patient Instructions (Addendum)
You are doing well.  Please cut the amiodarone down to 1/2 pill a day Decrease the metoprolol down to 1/2 pill twice a day  Please start amlodipine 1/2 pill and monitor your blood pressure Monitor your blood pressure, If it runs high, We will increase up to a full pill Goal <150 on the top number  Please call us if you have new issues that need to be addressed before your next appt.  Your physician wants you to follow-up in: 3 month.

## 2015-01-29 ENCOUNTER — Telehealth: Payer: Self-pay | Admitting: *Deleted

## 2015-01-29 ENCOUNTER — Other Ambulatory Visit: Payer: Self-pay

## 2015-01-29 MED ORDER — AMLODIPINE BESYLATE 10 MG PO TABS: 10.0000 mg | ORAL_TABLET | Freq: Every day | ORAL | Status: DC

## 2015-01-29 NOTE — Telephone Encounter (Signed)
Refill sent for amlodipine.  

## 2015-01-29 NOTE — Telephone Encounter (Signed)
°  1. Which medications need to be refilled? AmLODIPine Besylate  2. Which pharmacy is medication to be sent to? [facility]   3. Do they need a 30 day or 90 day supply? 90   4. Would they like a call back once the medication has been sent to the pharmacy? No   Pt called stating that she called her pharmacy and they have no received this refill please send again

## 2015-01-30 ENCOUNTER — Ambulatory Visit (HOSPITAL_COMMUNITY)
Admission: RE | Admit: 2015-01-30 | Discharge: 2015-01-30 | Disposition: A | Discharge status: Home / Self Care | Payer: Medicare Other | Source: Ambulatory Visit | Attending: Family Medicine | Admitting: Family Medicine

## 2015-01-30 DIAGNOSIS — Z1231 Encounter for screening mammogram for malignant neoplasm of breast: Secondary | ICD-10-CM | POA: Insufficient documentation

## 2015-01-31 ENCOUNTER — Encounter (INDEPENDENT_AMBULATORY_CARE_PROVIDER_SITE_OTHER): Payer: Self-pay | Admitting: *Deleted

## 2015-02-24 ENCOUNTER — Other Ambulatory Visit (HOSPITAL_COMMUNITY): Payer: Self-pay | Admitting: Family Medicine

## 2015-02-24 DIAGNOSIS — M545 Low back pain, unspecified: Secondary | ICD-10-CM

## 2015-02-24 DIAGNOSIS — Z87891 Personal history of nicotine dependence: Secondary | ICD-10-CM

## 2015-02-27 ENCOUNTER — Ambulatory Visit (HOSPITAL_COMMUNITY): Admission: RE | Admit: 2015-02-27 | Payer: Medicare Other | Source: Ambulatory Visit

## 2015-03-05 ENCOUNTER — Ambulatory Visit (HOSPITAL_COMMUNITY): Payer: Medicare Other

## 2015-03-06 ENCOUNTER — Ambulatory Visit (INDEPENDENT_AMBULATORY_CARE_PROVIDER_SITE_OTHER): Payer: Medicare Other | Admitting: Internal Medicine

## 2015-03-17 ENCOUNTER — Other Ambulatory Visit: Payer: Self-pay | Admitting: Cardiovascular Disease

## 2015-03-17 ENCOUNTER — Ambulatory Visit (HOSPITAL_COMMUNITY)
Admission: RE | Admit: 2015-03-17 | Discharge: 2015-03-17 | Disposition: A | Discharge status: Home / Self Care | Payer: Medicare Other | Source: Ambulatory Visit | Attending: Family Medicine | Admitting: Family Medicine

## 2015-03-17 DIAGNOSIS — M545 Low back pain, unspecified: Secondary | ICD-10-CM

## 2015-03-17 DIAGNOSIS — R109 Unspecified abdominal pain: Secondary | ICD-10-CM | POA: Diagnosis present

## 2015-03-17 DIAGNOSIS — Z87891 Personal history of nicotine dependence: Secondary | ICD-10-CM

## 2015-03-21 ENCOUNTER — Other Ambulatory Visit: Payer: Self-pay | Admitting: Cardiovascular Disease

## 2015-03-21 ENCOUNTER — Telehealth: Payer: Self-pay | Admitting: *Deleted

## 2015-03-21 MED ORDER — POTASSIUM CHLORIDE ER 10 MEQ PO TBCR: 10.0000 meq | EXTENDED_RELEASE_TABLET | Freq: Two times a day (BID) | ORAL | Status: DC | PRN

## 2015-03-21 MED ORDER — FUROSEMIDE 20 MG PO TABS: 20.0000 mg | ORAL_TABLET | Freq: Two times a day (BID) | ORAL | Status: DC | PRN

## 2015-03-21 NOTE — Telephone Encounter (Signed)
Pt requesting meloxicam(mobic)  Rx. Last filled by R[facility] Situ, ok to refill?

## 2015-03-21 NOTE — Telephone Encounter (Signed)
°  1. Which medications need to be refilled? Fluid pills and Potassium pills   2. Which pharmacy is medication to be sent to? [facility]  3. Do they need a 30 day or 90 day supply? 90   4. Would they like a call back once the medication has been sent to the pharmacy? No   Pt is also stating that she is having some chest discomfort but does not seem to be very bad.  She is willing to wait until apt next month.

## 2015-04-08 ENCOUNTER — Other Ambulatory Visit: Payer: Self-pay | Admitting: Cardiovascular Disease

## 2015-04-09 ENCOUNTER — Ambulatory Visit (INDEPENDENT_AMBULATORY_CARE_PROVIDER_SITE_OTHER): Payer: Medicare Other | Admitting: Internal Medicine

## 2015-04-09 ENCOUNTER — Other Ambulatory Visit (INDEPENDENT_AMBULATORY_CARE_PROVIDER_SITE_OTHER): Payer: Self-pay | Admitting: Internal Medicine

## 2015-04-09 ENCOUNTER — Telehealth: Payer: Self-pay

## 2015-04-09 ENCOUNTER — Encounter (INDEPENDENT_AMBULATORY_CARE_PROVIDER_SITE_OTHER): Payer: Self-pay | Admitting: *Deleted

## 2015-04-09 ENCOUNTER — Encounter (INDEPENDENT_AMBULATORY_CARE_PROVIDER_SITE_OTHER): Payer: Self-pay | Admitting: Internal Medicine

## 2015-04-09 VITALS — BP 122/54 | HR 72 | Temp 97.5°F | Ht 67.0 in | Wt 187.2 lb

## 2015-04-09 DIAGNOSIS — R1319 Other dysphagia: Secondary | ICD-10-CM

## 2015-04-09 DIAGNOSIS — R1310 Dysphagia, unspecified: Secondary | ICD-10-CM | POA: Diagnosis not present

## 2015-04-09 NOTE — Progress Notes (Addendum)
Subjective:    Patient ID: [name], female    DOB: 1940/07/06, 74 y.o.   MRN: [id number]  HPI Presents today with c/o that when she eats, she tells me she cannot swallow her saliva. She says when she eats, it feels like foods are lodging mid-esophagus. Symptoms x 6 months to a year. Occuring 2-3 times a week. She has a hard time swallowing meats. Hx of same. Last EGD/ED was in 2012.  Taking Protonix BID since seeing Dr. Karie Kirks 4 months ago.  Appetite is good. No weight loss.  BMs are normal. No melena or BRRB. No family hx of colon cancer.      05/05/2011 EGD/ED, Colonoscopy  Indications: Patient is 74 year old Caucasian female who is 18 month history of intermittent solid food dysphagia. Is undergoing diagnostic/therapeutic EGD and average risk screening colonoscopy.  Impression:  Erosive gastritis otherwise normal EGD. Esophagus dilated by passing 56 French Maloney dilator but no mucosal disruption induced. few small diverticula at sigmoid colon otherwise normal colonoscopy. H. Pylori was negative. Review of Systems Past Medical History  Diagnosis Date  . Dysphagia   . CAD (coronary artery disease)   . MI (myocardial infarction) ([place])     2003 with one stent placement  . Hypertension     2002  . Hypertriglyceridemia   . COPD (chronic obstructive pulmonary disease) ([place])   . GERD (gastroesophageal reflux disease)   . Carotid artery disease ([place])   . Cervical disc disease   . PONV (postoperative nausea and vomiting)   . History of kidney stones   . Dysrhythmia     AFib    Past Surgical History  Procedure Laterality Date  . Cholecystectomy    . Total abdominal hysterectomy w/ bilateral salpingoophorectomy    . Shoulder surgery Right     torn rotator cuff  . Cardiac catheterization      with stent 2004  CYPHER 2.75 x 71mm coronary stent in  the mid left anterior descending stenotic lesion    Last stress test showed EF of 775  . Right knee arthroscopy  2010  . Balloon dilation  05/05/2011    Procedure: BALLOON DILATION;  Surgeon: Rogene Houston, MD;  [facility];  Service: Endoscopy;  Laterality: N/A;  Venia Minks dilation  05/05/2011    Procedure: Venia Minks DILATION;  Surgeon: Rogene Houston, MD;  [facility];  Service: Endoscopy;  Laterality: N/A;  . Savory dilation  05/05/2011    Procedure: SAVORY DILATION;  Surgeon: Rogene Houston, MD;  [facility];  Service: Endoscopy;  Laterality: N/A;  . Coronary angioplasty      1 stent  . Abdominal hysterectomy    . Cervical disc surgery    . Cataract extraction Left 2002  . Cataract extraction w/phaco Right 05/14/2013    Procedure: CATARACT EXTRACTION PHACO AND INTRAOCULAR LENS PLACEMENT RIGHT EYE;  Surgeon: Tonny Branch, MD;  [facility];  Service: Ophthalmology;  Laterality: Right;  CDE:  20.45  . Cardioversion    . Amputation Right 04/12/2014    Procedure: Revision AMPUTATION Right Long DIGIT;  Surgeon: Leanora Cover, MD;  Location: [place];  Service: Orthopedics;  Laterality: Right;    Allergies  Allergen Reactions  . Penicillins Anaphylaxis    Near death experience,   . Lipitor [Atorvastatin] Other (See Comments)    UNSPECIFIED.  Marland Kitchen Ceclor [Cefaclor] Nausea And Vomiting  . Codeine Nausea Only and Other (See Comments)    Cramps, nausea  .  Sulfa Antibiotics Rash         Current Outpatient Prescriptions on File Prior to Visit  Medication Sig Dispense Refill  . Acetaminophen (TYLENOL PO) Take 500-1,000 mg by mouth every 6 (six) hours as needed (pain).     . ADVAIR DISKUS 500-50 MCG/DOSE AEPB 1 puff 2 (two) times daily.     Marland Kitchen albuterol (PROVENTIL) (2.5 MG/3ML) 0.083% nebulizer solution Take 2.5 mg by nebulization every 6 (six) hours as needed. For shortness of breath    . amiodarone (PACERONE) 200 MG tablet Take 1 tablet (200 mg total) by  mouth daily. 30 tablet 6  . amLODipine (NORVASC) 10 MG tablet Take 1 tablet (10 mg total) by mouth daily. 90 tablet 3  . Ascorbic Acid (VITAMIN C) 100 MG tablet Take 100 mg by mouth daily.     . cholecalciferol (VITAMIN D) 1000 UNITS tablet Take 1,000 Units by mouth daily.    . clonazePAM (KLONOPIN) 0.5 MG tablet Take 0.5 mg by mouth at bedtime as needed. For sleep    . furosemide (LASIX) 20 MG tablet Take 1 tablet (20 mg total) by mouth 2 (two) times daily as needed. 180 tablet 3  . isosorbide mononitrate (IMDUR) 60 MG 24 hr tablet Take 60 mg by mouth daily.      Marland Kitchen levothyroxine (SYNTHROID, LEVOTHROID) 50 MCG tablet Take 1 tablet (50 mcg total) by mouth daily.    Marland Kitchen lisinopril-hydrochlorothiazide (PRINZIDE,ZESTORETIC) 20-25 MG per tablet Take 1 tablet by mouth daily.    . meloxicam (MOBIC) 15 MG tablet Take 1 tablet (15 mg total) by mouth daily. 90 tablet 3  . metoprolol tartrate (LOPRESSOR) 25 MG tablet Take 25 mg by mouth 2 (two) times daily.    Marland Kitchen omega-3 acid ethyl esters (LOVAZA) 1 G capsule Take 2 g by mouth 2 (two) times daily.      . pantoprazole (PROTONIX) 40 MG tablet Take 40 mg by mouth daily.    . potassium chloride (K-DUR) 10 MEQ tablet Take 1 tablet (10 mEq total) by mouth 2 (two) times daily as needed. 180 tablet 3  . PRADAXA 150 MG CAPS capsule TAKE ONE CAPSULE EVERY 12 HOURS 60 capsule 3  . traZODone (DESYREL) 50 MG tablet Take 1 tablet (50 mg total) by mouth at bedtime. 30 tablet 1   No current facility-administered medications on file prior to visit.        Objective:   Physical ExamBlood pressure 122/54, pulse 72, temperature 97.5 F (36.4 C), height 5\' 7"  (1.702 m), weight 187 lb 3.2 oz (84.913 kg). Alert and oriented. Skin warm and dry. Oral mucosa is moist.   . Sclera anicteric, conjunctivae is pink. Thyroid not enlarged. No cervical lymphadenopathy. Bilateral wheezes. Heart regular rate and rhythm.  Abdomen is soft. Bowel sounds are positive. No hepatomegaly. No  abdominal masses felt. No tenderness.  No edema to lower extremities.          Assessment & Plan:  Dysphagia to solids. Stricture needs to be ruled out.  EGD/ED. The risks and benefits such as perforation, bleeding, and infection were reviewed with the patient and is agreeable.

## 2015-04-09 NOTE — Telephone Encounter (Signed)
Pt is scheduled for upper Endoscopy with Dilation on 10/21. Pt needs to stop Pradaxa 2 days before. Please approve, or advise. Nurse states ok to send msg through [employer], to [employer].

## 2015-04-09 NOTE — Patient Instructions (Signed)
EGD/ED. The risks and benefits such as perforation, bleeding, and infection were reviewed with the patient and is agreeable. 

## 2015-04-10 NOTE — Telephone Encounter (Signed)
Acceptable risk for procedure No further testing needed. Okay to hold pradaxa for 2 days prior to procedure Would restart when appropriate following the procedure

## 2015-04-10 NOTE — Telephone Encounter (Signed)
Patient aware, forwarded to Dr Laural Golden

## 2015-04-13 NOTE — Telephone Encounter (Signed)
Dr. Rockey Situ recommendations appreciated regarding holding anticoagulant for procedure

## 2015-04-25 ENCOUNTER — Encounter (HOSPITAL_COMMUNITY): Payer: Self-pay | Admitting: *Deleted

## 2015-04-25 ENCOUNTER — Ambulatory Visit (HOSPITAL_COMMUNITY)
Admission: RE | Admit: 2015-04-25 | Discharge: 2015-04-25 | Disposition: A | Discharge status: Home / Self Care | Payer: Medicare Other | Source: Ambulatory Visit | Attending: Internal Medicine | Admitting: Internal Medicine

## 2015-04-25 ENCOUNTER — Encounter (HOSPITAL_COMMUNITY)
Admission: RE | Disposition: A | Discharge status: Home / Self Care | Payer: Self-pay | Source: Ambulatory Visit | Attending: Internal Medicine

## 2015-04-25 DIAGNOSIS — Z9079 Acquired absence of other genital organ(s): Secondary | ICD-10-CM | POA: Diagnosis not present

## 2015-04-25 DIAGNOSIS — K219 Gastro-esophageal reflux disease without esophagitis: Secondary | ICD-10-CM | POA: Insufficient documentation

## 2015-04-25 DIAGNOSIS — Z87442 Personal history of urinary calculi: Secondary | ICD-10-CM | POA: Insufficient documentation

## 2015-04-25 DIAGNOSIS — Z79899 Other long term (current) drug therapy: Secondary | ICD-10-CM | POA: Insufficient documentation

## 2015-04-25 DIAGNOSIS — Z9049 Acquired absence of other specified parts of digestive tract: Secondary | ICD-10-CM | POA: Insufficient documentation

## 2015-04-25 DIAGNOSIS — Z888 Allergy status to other drugs, medicaments and biological substances status: Secondary | ICD-10-CM | POA: Insufficient documentation

## 2015-04-25 DIAGNOSIS — Z885 Allergy status to narcotic agent status: Secondary | ICD-10-CM | POA: Insufficient documentation

## 2015-04-25 DIAGNOSIS — Z882 Allergy status to sulfonamides status: Secondary | ICD-10-CM | POA: Diagnosis not present

## 2015-04-25 DIAGNOSIS — Z880 Allergy status to penicillin: Secondary | ICD-10-CM | POA: Diagnosis not present

## 2015-04-25 DIAGNOSIS — I4891 Unspecified atrial fibrillation: Secondary | ICD-10-CM | POA: Insufficient documentation

## 2015-04-25 DIAGNOSIS — R1310 Dysphagia, unspecified: Secondary | ICD-10-CM | POA: Insufficient documentation

## 2015-04-25 DIAGNOSIS — Z89021 Acquired absence of right finger(s): Secondary | ICD-10-CM | POA: Insufficient documentation

## 2015-04-25 DIAGNOSIS — J449 Chronic obstructive pulmonary disease, unspecified: Secondary | ICD-10-CM | POA: Insufficient documentation

## 2015-04-25 DIAGNOSIS — Z90722 Acquired absence of ovaries, bilateral: Secondary | ICD-10-CM | POA: Insufficient documentation

## 2015-04-25 DIAGNOSIS — F17210 Nicotine dependence, cigarettes, uncomplicated: Secondary | ICD-10-CM | POA: Insufficient documentation

## 2015-04-25 DIAGNOSIS — E781 Pure hyperglyceridemia: Secondary | ICD-10-CM | POA: Diagnosis not present

## 2015-04-25 DIAGNOSIS — E039 Hypothyroidism, unspecified: Secondary | ICD-10-CM | POA: Diagnosis not present

## 2015-04-25 DIAGNOSIS — Z90710 Acquired absence of both cervix and uterus: Secondary | ICD-10-CM | POA: Insufficient documentation

## 2015-04-25 DIAGNOSIS — I2510 Atherosclerotic heart disease of native coronary artery without angina pectoris: Secondary | ICD-10-CM | POA: Diagnosis not present

## 2015-04-25 DIAGNOSIS — I252 Old myocardial infarction: Secondary | ICD-10-CM | POA: Insufficient documentation

## 2015-04-25 DIAGNOSIS — R1319 Other dysphagia: Secondary | ICD-10-CM

## 2015-04-25 DIAGNOSIS — I10 Essential (primary) hypertension: Secondary | ICD-10-CM | POA: Diagnosis not present

## 2015-04-25 DIAGNOSIS — Z955 Presence of coronary angioplasty implant and graft: Secondary | ICD-10-CM | POA: Diagnosis not present

## 2015-04-25 HISTORY — PX: ESOPHAGEAL DILATION: SHX303

## 2015-04-25 HISTORY — PX: ESOPHAGOGASTRODUODENOSCOPY: SHX5428

## 2015-04-25 HISTORY — DX: Hypothyroidism, unspecified: E03.9

## 2015-04-25 SURGERY — EGD (ESOPHAGOGASTRODUODENOSCOPY)
Anesthesia: Moderate Sedation

## 2015-04-25 MED ORDER — STERILE WATER FOR IRRIGATION IR SOLN
Status: DC | PRN
  Administered 2015-04-25: 14:00:00

## 2015-04-25 MED ORDER — MEPERIDINE HCL 50 MG/ML IJ SOLN
INTRAMUSCULAR | Status: DC | PRN
  Administered 2015-04-25 (×2): 25 mg via INTRAVENOUS

## 2015-04-25 MED ORDER — MIDAZOLAM HCL 5 MG/5ML IJ SOLN
INTRAMUSCULAR | Status: DC | PRN
  Administered 2015-04-25: 2 mg via INTRAVENOUS
  Administered 2015-04-25: 1 mg via INTRAVENOUS
  Administered 2015-04-25: 2 mg via INTRAVENOUS

## 2015-04-25 MED ORDER — MIDAZOLAM HCL 5 MG/5ML IJ SOLN
INTRAMUSCULAR | Status: AC
  Filled 2015-04-25: qty 10

## 2015-04-25 MED ORDER — MEPERIDINE HCL 50 MG/ML IJ SOLN
INTRAMUSCULAR | Status: AC
  Filled 2015-04-25: qty 1

## 2015-04-25 MED ORDER — SODIUM CHLORIDE 0.9 % IV SOLN
INTRAVENOUS | Status: DC
  Administered 2015-04-25: 1000 mL via INTRAVENOUS

## 2015-04-25 NOTE — Discharge Instructions (Signed)
Resume usual medications including Pradaxa. Resume usual diet. No driving for 24 hours. Please call office with progress report in one week    Esophagogastroduodenoscopy, Care After Refer to this sheet in the next few weeks. These instructions provide you with information about caring for yourself after your procedure. Your health care provider may also give you more specific instructions. Your treatment has been planned according to current medical practices, but problems sometimes occur. Call your health care provider if you have any problems or questions after your procedure. WHAT TO EXPECT AFTER THE PROCEDURE After your procedure, it is typical to feel:  Soreness in your throat.  Pain with swallowing.  Sick to your stomach (nauseous).  Bloated.  Dizzy.  Fatigued. HOME CARE INSTRUCTIONS  Do not eat or drink anything until the numbing medicine (local anesthetic) has worn off and your gag reflex has returned. You will know that the local anesthetic has worn off when you can swallow comfortably.  Do not drive or operate machinery until directed by your health care provider.  Take medicines only as directed by your health care provider. SEEK MEDICAL CARE IF:   You cannot stop coughing.  You are not urinating at all or less than usual. SEEK IMMEDIATE MEDICAL CARE IF:  You have difficulty swallowing.  You cannot eat or drink.  You have worsening throat or chest pain.  You have dizziness or lightheadedness or you faint.  You have nausea or vomiting.  You have chills.  You have a fever.  You have severe abdominal pain.  You have black, tarry, or bloody stools.   This information is not intended to replace advice given to you by your health care provider. Make sure you discuss any questions you have with your health care provider.   Document Released: 06/07/2012 Document Revised: 07/12/2014 Document Reviewed: 06/07/2012 Elsevier Interactive Patient Education [employer]

## 2015-04-25 NOTE — Op Note (Signed)
EGD PROCEDURE REPORT  PATIENT:  [name]  MR#:  [id number] Birthdate:  28-Oct-1940, 74 y.o., female Endoscopist:  Dr. Rogene Houston, MD Referred By:  Dr. Robert Bellow, MD  Procedure Date: 04/25/2015  Procedure:   EGD with ED  Indications:  Patient is 74 year old Caucasian female was chronic GERD and now presents with 2 month history of solid food dysphagia.            Informed Consent:  The risks, benefits, alternatives & imponderables which include, but are not limited to, bleeding, infection, perforation, drug reaction and potential missed lesion have been reviewed.  The potential for biopsy, lesion removal, esophageal dilation, etc. have also been discussed.  Questions have been answered.  All parties agreeable.  Please see history & physical in medical record for more information.  Medications:  Demerol 50 mg IV Versed 5 mg IV Cetacaine spray topically for oropharyngeal anesthesia  Description of procedure:  The endoscope was introduced through the mouth and advanced to the second portion of the duodenum without difficulty or limitations. The mucosal surfaces were surveyed very carefully during advancement of the scope and upon withdrawal.  Findings:  Esophagus:  Mucosa of the esophagus was normal. GE junction was unremarkable. GEJ:  44 cm Stomach:  Stomach was empty and distended very well with insufflation. Folds in the proximal stomach were normal. Examination of mucosa at gastric body, antrum, pyloric channel, angularis fundus and cardia was normal. Duodenum:  Normal bulbar and post bulbar mucosa.  Therapeutic/Diagnostic Maneuvers Performed:   Esophagus was dilated by passing 56 [place] Maloney dilator from insertion. As the dilator was withdrawn endoscope was passed again and no disruption noted to esophageal mucosa.  Complications:  None  EBL: None  Impression: Normal esophagogastroduodenoscopy. Esophagus dilated by passing 56 French Maloney dilator but no  disruption noted to esophageal mucosa.  Recommendations:  Standard instructions given. Patient can resume Pradaxa at usual dose today. Patient will call with progress report in one week. If she remains with dysphagia will need further workup.  Linetta Regner U  04/25/2015  2:14 PM  CC: Dr. Robert Bellow, MD & Dr. Rayne Du ref. provider found

## 2015-04-25 NOTE — H&P (Signed)
[name] is an 74 y.o. female.   Chief Complaint: Patient is here for EGD and ED.Marland Kitchen HPI: Patient is 74 year old Caucasian female who presents with 2 month history of dysphagia to solids. She points to mid and/or starting the site of bolus obstruction. She is an episode of food impaction every 5-6 weeks early with regurgitation. She feels heartburns well controlled with therapy. She denies nausea vomiting abdominal pain or melena. Her esophagus was last dilated 4 years ago. She responded to dilation and no structural abnormality was noted. Patient has been off Pradaxa for 2 days.  Past Medical History  Diagnosis Date  . Dysphagia   . CAD (coronary artery disease)   . MI (myocardial infarction) ([place])     2003 with one stent placement  . Hypertension     2002  . Hypertriglyceridemia   . COPD (chronic obstructive pulmonary disease) ([place])   . GERD (gastroesophageal reflux disease)   . Carotid artery disease ([place])   . Cervical disc disease   . PONV (postoperative nausea and vomiting)   . History of kidney stones   . Dysrhythmia     AFib  . Hypothyroidism     Past Surgical History  Procedure Laterality Date  . Cholecystectomy    . Total abdominal hysterectomy w/ bilateral salpingoophorectomy    . Shoulder surgery Right     torn rotator cuff  . Cardiac catheterization      with stent 2004  CYPHER 2.75 x 57mm coronary stent in the mid left anterior descending stenotic lesion    Last stress test showed EF of 775  . Right knee arthroscopy  2010  . Balloon dilation  05/05/2011    Procedure: BALLOON DILATION;  Surgeon: Rogene Houston, MD;  [facility]: [facility];  Service: Endoscopy;  Laterality: N/A;  Venia Minks dilation  05/05/2011    Procedure: Venia Minks DILATION;  Surgeon: Rogene Houston, MD;  Location: [facility];  Service: Endoscopy;  Laterality: N/A;  . Savory dilation  05/05/2011    Procedure: SAVORY DILATION;  Surgeon: Rogene Houston, MD;  Location: [facility];   Service: Endoscopy;  Laterality: N/A;  . Coronary angioplasty      1 stent  . Abdominal hysterectomy    . Cervical disc surgery    . Cataract extraction Left 2002  . Cataract extraction w/phaco Right 05/14/2013    Procedure: CATARACT EXTRACTION PHACO AND INTRAOCULAR LENS PLACEMENT RIGHT EYE;  Surgeon: Tonny Branch, MD;  Location: [facility];  Service: Ophthalmology;  Laterality: Right;  CDE:  20.45  . Cardioversion    . Amputation Right 04/12/2014    Procedure: Revision AMPUTATION Right Long DIGIT;  Surgeon: Leanora Cover, MD;  Location: [facility];  Service: Orthopedics;  Laterality: Right;    Family History  Problem Relation Age of Onset  . Colon cancer Neg Hx   . Heart attack Father   . Heart disease Sister   . Heart disease Brother   . Hypertension Sister   . Diabetes Mother   . Heart failure Mother    Social History:  reports that she has been smoking Cigarettes.  She has a 56 pack-year smoking history. She does not have any smokeless tobacco history on file. She reports that she does not drink alcohol or use illicit drugs.  Allergies:  Allergies  Allergen Reactions  . Penicillins Anaphylaxis    Near death experience,   . Lipitor [Atorvastatin] Other (See Comments)    UNSPECIFIED.  Marland Kitchen Ceclor [Cefaclor]  Nausea And Vomiting  . Codeine Nausea Only and Other (See Comments)    Cramps, nausea  . Sulfa Antibiotics Rash         Medications Prior to Admission  Medication Sig Dispense Refill  . Acetaminophen (TYLENOL PO) Take 500-1,000 mg by mouth every 6 (six) hours as needed (pain).     . ADVAIR DISKUS 500-50 MCG/DOSE AEPB 1 puff 2 (two) times daily.     Marland Kitchen albuterol (PROVENTIL) (2.5 MG/3ML) 0.083% nebulizer solution Take 2.5 mg by nebulization every 6 (six) hours as needed. For shortness of breath    . amiodarone (PACERONE) 200 MG tablet Take 1 tablet (200 mg total) by mouth daily. 30 tablet 6  . amLODipine (NORVASC) 10 MG tablet Take 1 tablet (10 mg total) by mouth daily. 90 tablet 3  .  Ascorbic Acid (VITAMIN C) 100 MG tablet Take 100 mg by mouth daily.     . cholecalciferol (VITAMIN D) 1000 UNITS tablet Take 1,000 Units by mouth daily.    . clonazePAM (KLONOPIN) 0.5 MG tablet Take 0.5 mg by mouth at bedtime as needed. For sleep    . furosemide (LASIX) 20 MG tablet Take 1 tablet (20 mg total) by mouth 2 (two) times daily as needed. 180 tablet 3  . isosorbide mononitrate (IMDUR) 60 MG 24 hr tablet Take 60 mg by mouth daily.      Marland Kitchen levothyroxine (SYNTHROID, LEVOTHROID) 50 MCG tablet Take 1 tablet (50 mcg total) by mouth daily.    Marland Kitchen lisinopril-hydrochlorothiazide (PRINZIDE,ZESTORETIC) 20-25 MG per tablet Take 1 tablet by mouth daily.    . meloxicam (MOBIC) 15 MG tablet Take 1 tablet (15 mg total) by mouth daily. 90 tablet 3  . metoprolol tartrate (LOPRESSOR) 25 MG tablet Take 25 mg by mouth 2 (two) times daily.    Marland Kitchen omega-3 acid ethyl esters (LOVAZA) 1 G capsule Take 2 g by mouth 2 (two) times daily.      . pantoprazole (PROTONIX) 40 MG tablet Take 40 mg by mouth 2 (two) times daily before a meal.     . potassium chloride (K-DUR) 10 MEQ tablet Take 1 tablet (10 mEq total) by mouth 2 (two) times daily as needed. 180 tablet 3  . PRADAXA 150 MG CAPS capsule TAKE ONE CAPSULE EVERY 12 HOURS 60 capsule 3  . traZODone (DESYREL) 50 MG tablet Take 1 tablet (50 mg total) by mouth at bedtime. 30 tablet 1    No results found for this or any previous visit (from the past 48 hour(s)). No results found.  ROS  Blood pressure 121/54, pulse 62, temperature 98.8 F (37.1 C), temperature source Oral, resp. rate 18, height 5\' 7"  (1.702 m), weight 182 lb (82.555 kg), SpO2 96 %. Physical Exam  Constitutional: She appears well-developed and well-nourished.  HENT:  Mouth/Throat: Oropharynx is clear and moist.  Eyes: Conjunctivae are normal. No scleral icterus.  Neck: No thyromegaly present.  Cardiovascular:  No murmur heard. Iregular rhythm normal S1 and S2. No murmur or gallop noted.   Respiratory: Effort normal and breath sounds normal.  GI: Soft. She exhibits no distension and no mass. There is no tenderness.  Musculoskeletal: She exhibits no edema.  Lymphadenopathy:    She has no cervical adenopathy.  Neurological: She is alert.  Skin: Skin is warm and dry.     Assessment/Plan Solid food dysphagia in a patient with chronic GERD. EGD with ED.  Trenten Watchman U 04/25/2015, 1:53 PM

## 2015-04-29 ENCOUNTER — Telehealth: Payer: Self-pay | Admitting: Cardiovascular Disease

## 2015-04-29 ENCOUNTER — Ambulatory Visit (INDEPENDENT_AMBULATORY_CARE_PROVIDER_SITE_OTHER): Payer: Medicare Other | Admitting: Cardiovascular Disease

## 2015-04-29 ENCOUNTER — Encounter: Payer: Self-pay | Admitting: Cardiovascular Disease

## 2015-04-29 VITALS — BP 108/50 | HR 59 | Ht 62.0 in | Wt 188.0 lb

## 2015-04-29 DIAGNOSIS — I2510 Atherosclerotic heart disease of native coronary artery without angina pectoris: Secondary | ICD-10-CM

## 2015-04-29 DIAGNOSIS — E785 Hyperlipidemia, unspecified: Secondary | ICD-10-CM

## 2015-04-29 DIAGNOSIS — I10 Essential (primary) hypertension: Secondary | ICD-10-CM

## 2015-04-29 DIAGNOSIS — R5382 Chronic fatigue, unspecified: Secondary | ICD-10-CM

## 2015-04-29 DIAGNOSIS — E039 Hypothyroidism, unspecified: Secondary | ICD-10-CM

## 2015-04-29 DIAGNOSIS — Z720 Tobacco use: Secondary | ICD-10-CM

## 2015-04-29 DIAGNOSIS — I4891 Unspecified atrial fibrillation: Secondary | ICD-10-CM

## 2015-04-29 DIAGNOSIS — F17200 Nicotine dependence, unspecified, uncomplicated: Secondary | ICD-10-CM

## 2015-04-29 MED ORDER — DABIGATRAN ETEXILATE MESYLATE 150 MG PO CAPS: 150.0000 mg | ORAL_CAPSULE | Freq: Two times a day (BID) | ORAL | Status: DC

## 2015-04-29 MED ORDER — AMIODARONE HCL 200 MG PO TABS: 200.0000 mg | ORAL_TABLET | Freq: Every day | ORAL | Status: DC

## 2015-04-29 MED ORDER — EZETIMIBE 10 MG PO TABS: 10.0000 mg | ORAL_TABLET | Freq: Every day | ORAL | Status: DC

## 2015-04-29 MED ORDER — AMLODIPINE BESYLATE 5 MG PO TABS: 5.0000 mg | ORAL_TABLET | Freq: Every day | ORAL | Status: DC

## 2015-04-29 NOTE — Assessment & Plan Note (Signed)
Blood pressures running low. We have recommended she decrease her amlodipine down to 5 mg daily

## 2015-04-29 NOTE — Assessment & Plan Note (Signed)
Most recent TSH of 12. Possibly contributing to her fatigue

## 2015-04-29 NOTE — Telephone Encounter (Signed)
Spoke w/ pharmacist.  Advised that Dr. Rockey Situ decreased pt's amlodipine to 5 mg at pt's ov today.  She will proceed w/ filling pt's rx.

## 2015-04-29 NOTE — Telephone Encounter (Signed)
Pharmacy wants to clarify med changes with office before filling rx for Norvasc and Amiodarone.  Please call [place] [phone]

## 2015-04-29 NOTE — Assessment & Plan Note (Signed)
We have encouraged her to continue to work on weaning her cigarettes and smoking cessation. She will continue to work on this and does not want any assistance with chantix.  

## 2015-04-29 NOTE — Assessment & Plan Note (Signed)
High risk of CAD given statin intolerance, continued smoking Currently with no symptoms of angina

## 2015-04-29 NOTE — Patient Instructions (Addendum)
You are doing well.  Please start zetia one a day for cholesterol  Please cut the amlodipine down to 5 mg daily  Please call us if you have new issues that need to be addressed before your next appt.  Your physician wants you to follow-up in: 3 months.  You will receive a reminder letter in the mail two months in advance. If you don't receive a letter, please call our office to schedule the follow-up appointment.  Steps to Quit Smoking  Smoking tobacco can be harmful to your health and can affect almost every organ in your body. Smoking puts you, and those around you, at risk for developing many serious chronic diseases. Quitting smoking is difficult, but it is one of the best things that you can do for your health. It is never too late to quit. WHAT ARE THE BENEFITS OF QUITTING SMOKING? When you quit smoking, you lower your risk of developing serious diseases and conditions, such as:  Lung cancer or lung disease, such as COPD.  Heart disease.  Stroke.  Heart attack.  Infertility.  Osteoporosis and bone fractures. Additionally, symptoms such as coughing, wheezing, and shortness of breath may get better when you quit. You may also find that you get sick less often because your body is stronger at fighting off colds and infections. If you are pregnant, quitting smoking can help to reduce your chances of having a baby of low birth weight. HOW DO I GET READY TO QUIT? When you decide to quit smoking, create a plan to make sure that you are successful. Before you quit:  Pick a date to quit. Set a date within the next two weeks to give you time to prepare.  Write down the reasons why you are quitting. Keep this list in places where you will see it often, such as on your bathroom mirror or in your car or wallet.  Identify the people, places, things, and activities that make you want to smoke (triggers) and avoid them. Make sure to take these actions:  Throw away all cigarettes at home,  at work, and in your car.  Throw away smoking accessories, such as Scientist, research (medical).  Clean your car and make sure to empty the ashtray.  Clean your home, including curtains and carpets.  Tell your family, friends, and coworkers that you are quitting. Support from your loved ones can make quitting easier.  Talk with your health care provider about your options for quitting smoking.  Find out what treatment options are covered by your health insurance. WHAT STRATEGIES CAN I USE TO QUIT SMOKING?  Talk with your healthcare provider about different strategies to quit smoking. Some strategies include:  Quitting smoking altogether instead of gradually lessening how much you smoke over a period of time. Research shows that quitting "cold [place]" is more successful than gradually quitting.  Attending in-person counseling to help you build problem-solving skills. You are more likely to have success in quitting if you attend several counseling sessions. Even short sessions of 10 minutes can be effective.  Finding resources and support systems that can help you to quit smoking and remain smoke-free after you quit. These resources are most helpful when you use them often. They can include:  Online chats with a Social worker.  Telephone quitlines.  Printed Furniture conservator/restorer.  Support groups or group counseling.  Text messaging programs.  Mobile phone applications.  Taking medicines to help you quit smoking. (If you are pregnant or breastfeeding, talk with your  health care provider first.) Some medicines contain nicotine and some do not. Both types of medicines help with cravings, but the medicines that include nicotine help to relieve withdrawal symptoms. Your health care provider may recommend:  Nicotine patches, gum, or lozenges.  Nicotine inhalers or sprays.  Non-nicotine medicine that is taken by mouth. Talk with your health care provider about combining strategies, such as taking  medicines while you are also receiving in-person counseling. Using these two strategies together makes you more likely to succeed in quitting than if you used either strategy on its own. If you are pregnant or breastfeeding, talk with your health care provider about finding counseling or other support strategies to quit smoking. Do not take medicine to help you quit smoking unless told to do so by your health care provider. WHAT THINGS CAN I DO TO MAKE IT EASIER TO QUIT? Quitting smoking might feel overwhelming at first, but there is a lot that you can do to make it easier. Take these important actions:  Reach out to your family and friends and ask that they support and encourage you during this time. Call telephone quitlines, reach out to support groups, or work with a counselor for support.  Ask people who smoke to avoid smoking around you.  Avoid places that trigger you to smoke, such as bars, parties, or smoke-break areas at work.  Spend time around people who do not smoke.  Lessen stress in your life, because stress can be a smoking trigger for some people. To lessen stress, try:  Exercising regularly.  Deep-breathing exercises.  Yoga.  Meditating.  Performing a body scan. This involves closing your eyes, scanning your body from head to toe, and noticing which parts of your body are particularly tense. Purposefully relax the muscles in those areas.  Download or purchase mobile phone or tablet apps (applications) that can help you stick to your quit plan by providing reminders, tips, and encouragement. There are many free apps, such as QuitGuide from the [employer] Office manager for Disease Control and Prevention). You can find other support for quitting smoking (smoking cessation) through smokefree.gov and other websites. HOW WILL I FEEL WHEN I QUIT SMOKING? Within the first 24 hours of quitting smoking, you may start to feel some withdrawal symptoms. These symptoms are usually most noticeable  2-3 days after quitting, but they usually do not last beyond 2-3 weeks. Changes or symptoms that you might experience include:  Mood swings.  Restlessness, anxiety, or irritation.  Difficulty concentrating.  Dizziness.  Strong cravings for sugary foods in addition to nicotine.  Mild weight gain.  Constipation.  Nausea.  Coughing or a sore throat.  Changes in how your medicines work in your body.  A depressed mood.  Difficulty sleeping (insomnia). After the first 2-3 weeks of quitting, you may start to notice more positive results, such as:  Improved sense of smell and taste.  Decreased coughing and sore throat.  Slower heart rate.  Lower blood pressure.  Clearer skin.  The ability to breathe more easily.  Fewer sick days. Quitting smoking is very challenging for most people. Do not get discouraged if you are not successful the first time. Some people need to make many attempts to quit before they achieve long-term success. Do your best to stick to your quit plan, and talk with your health care provider if you have any questions or concerns.   This information is not intended to replace advice given to you by your health care provider. Make  sure you discuss any questions you have with your health care provider.   Document Released: 06/15/2001 Document Revised: 11/05/2014 Document Reviewed: 11/05/2014 Elsevier Interactive Patient Education 2016 [employer]. Smoking Hazards Smoking cigarettes is extremely bad for your health. Tobacco smoke has over 200 known poisons in it. It contains the poisonous gases nitrogen oxide and carbon monoxide. There are over 60 chemicals in tobacco smoke that cause cancer. Some of the chemicals found in cigarette smoke include:   Cyanide.   Benzene.   Formaldehyde.   Methanol (wood alcohol).   Acetylene (fuel used in welding torches).   Ammonia.  Even smoking lightly shortens your life expectancy by several years. You can  greatly reduce the risk of medical problems for you and your family by stopping now. Smoking is the most preventable cause of death and disease in our society. Within days of quitting smoking, your circulation improves, you decrease the risk of having a heart attack, and your lung capacity improves. There may be some increased phlegm in the first few days after quitting, and it may take months for your lungs to clear up completely. Quitting for 10 years reduces your risk of developing lung cancer to almost that of a nonsmoker.  WHAT ARE THE RISKS OF SMOKING? Cigarette smokers have an increased risk of many serious medical problems, including:  Lung cancer.   Lung disease (such as pneumonia, bronchitis, and emphysema).   Heart attack and chest pain due to the heart not getting enough oxygen (angina).   Heart disease and peripheral blood vessel disease.   Hypertension.   Stroke.   Oral cancer (cancer of the lip, mouth, or voice box).   Bladder cancer.   Pancreatic cancer.   Cervical cancer.   Pregnancy complications, including premature birth.   Stillbirths and smaller newborn babies, birth defects, and genetic damage to sperm.   Early menopause.   Lower estrogen level for women.   Infertility.   Facial wrinkles.   Blindness.   Increased risk of broken bones (fractures).   Senile dementia.   Stomach ulcers and internal bleeding.   Delayed wound healing and increased risk of complications during surgery. Because of secondhand smoke exposure, children of smokers have an increased risk of the following:   Sudden infant death syndrome (SIDS).   Respiratory infections.   Lung cancer.   Heart disease.   Ear infections.  WHY IS SMOKING ADDICTIVE? Nicotine is the chemical agent in tobacco that is capable of causing addiction or dependence. When you smoke and inhale, nicotine is absorbed rapidly into the bloodstream through your lungs. Both  inhaled and noninhaled nicotine may be addictive.  WHAT ARE THE BENEFITS OF QUITTING?  There are many health benefits to quitting smoking. Some are:   The likelihood of developing cancer and heart disease decreases. Health improvements are seen almost immediately.   Blood pressure, pulse rate, and breathing patterns start returning to normal soon after quitting.   People who quit may see an improvement in their overall quality of life.  HOW DO YOU QUIT SMOKING? Smoking is an addiction with both physical and psychological effects, and longtime habits can be hard to change. Your health care provider can recommend:  Programs and community resources, which may include group support, education, or therapy.  Replacement products, such as patches, gum, and nasal sprays. Use these products only as directed. Do not replace cigarette smoking with electronic cigarettes (commonly called e-cigarettes). The safety of e-cigarettes is unknown, and some may contain harmful chemicals.  FOR MORE INFORMATION  American Lung Association: www.lung.org  American Cancer Society: www.cancer.org   This information is not intended to replace advice given to you by your health care provider. Make sure you discuss any questions you have with your health care provider.   Document Released: 07/29/2004 Document Revised: 04/11/2013 Document Reviewed: 12/11/2012 Elsevier Interactive Patient Education [employer].

## 2015-04-29 NOTE — Assessment & Plan Note (Signed)
On metoprolol, amiodarone, anticoagulation. She does not want to stop her amiodarone despite thyroid dysfunction

## 2015-04-29 NOTE — Assessment & Plan Note (Signed)
Etiology unclear Possibly from poor sleep, low blood pressure, thyroid dysfunction

## 2015-04-29 NOTE — Assessment & Plan Note (Signed)
Recommended she start zetia 10 mg daily Unable to tolerate statins per the patient

## 2015-04-29 NOTE — Progress Notes (Signed)
Patient ID: TIMECA VALIDO, female    DOB: 11-Jun-1941, 74 y.o.   MRN: [id number]  HPI Comments: Ms. 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 In September 2014 she had CHF symptoms, found to be in atrial fibrillation. She was started on anticoagulation, given Lasix. Cardioversion November 2014  In follow-up, she reports that she is tired. Sometimes not sleeping well Thyroid medication recently increased. Initial TSH 18.8, down to 12 in July 2016. Managed by primary care We did talk about side effects of amiodarone and she does not want to change the medications as she does not want to go back into atrial fibrillation On her last clinic visit we did decrease her metoprolol, started amlodipine for blood pressure. Blood pressure is much better, if not too low Heart rate improved, as the 40s, now 50s. Still with fatigue Denies any chest pain concerning for angina. Continues to smoke She does not want a statin  EKG on today's visit shows normal sinus rhythm with rate 59 bpm, first-degree AV block, no significant ST or T-wave changes  Other past medical history Status post cardioversion after she failed to convert to normal sinus rhythm on amiodarone. Cardioversion 05/25/2013 which was successful in restoring normal sinus rhythm. She had converted from atrial fibrillation to atrial flutter at the time of the cardioversion. Lab  from May 2016 showing TSH 18.8, total cholesterol 170, LDL 97  She takes Lasix twice per week, typically as needed for ankle edema. She is napping in the daytime, no regular activity. She is tolerating anticoagulation, no bleeding chronic  neck pain, shoulder pain. She typically takes meloxicam when necessary She reports having a ruptured disc in her neck.     Allergies  Allergen Reactions  . Penicillins Anaphylaxis    Near death experience,   . Lipitor [Atorvastatin] Other (See Comments)    UNSPECIFIED.  Marland Kitchen Ceclor [Cefaclor] Nausea And Vomiting  . Codeine Nausea Only and Other (See Comments)    Cramps, nausea  . Sulfa Antibiotics Rash         Outpatient Encounter Prescriptions as of 04/29/2015  Medication Sig  . Acetaminophen (TYLENOL PO) Take 500-1,000 mg by mouth every 6 (six) hours as needed (pain).   . ADVAIR DISKUS 500-50 MCG/DOSE AEPB 1 puff 2 (two) times daily.   Marland Kitchen albuterol (PROVENTIL) (2.5 MG/3ML) 0.083% nebulizer solution Take 2.5 mg by nebulization every 6 (six) hours as needed. For shortness of breath  . amiodarone (PACERONE) 200 MG tablet Take 1 tablet (200 mg total) by mouth daily.  Marland Kitchen amLODipine (NORVASC) 5 MG tablet Take 1 tablet (5 mg total) by mouth daily.  . Ascorbic Acid (VITAMIN C) 100 MG tablet Take 100 mg by mouth daily.   . cholecalciferol (VITAMIN D) 1000 UNITS tablet Take 1,000 Units by mouth daily.  . clonazePAM (KLONOPIN) 1 MG tablet Take 1 mg by mouth as needed for anxiety.  . dabigatran (PRADAXA) 150 MG CAPS capsule Take 1 capsule (150 mg total) by mouth 2 (two) times daily.  Marland Kitchen ezetimibe (ZETIA) 10 MG tablet Take 1 tablet (10 mg total) by mouth daily.  . ferrous sulfate 325 (65 FE) MG tablet  Take 325 mg by mouth 2 (two) times daily with a meal.  . furosemide (LASIX) 20 MG tablet Take 1 tablet (20 mg total) by mouth 2 (two) times daily as needed.  . isosorbide mononitrate (IMDUR) 60 MG 24 hr tablet Take 60 mg by mouth daily.    Marland Kitchen levothyroxine (SYNTHROID, LEVOTHROID) 50 MCG tablet Take 1 tablet (50 mcg total) by mouth daily.  Marland Kitchen lisinopril-hydrochlorothiazide (PRINZIDE,ZESTORETIC) 20-25 MG per tablet Take 1 tablet by mouth daily.  . meloxicam (MOBIC) 15 MG tablet Take 1 tablet (15 mg total)  by mouth daily.  . metoprolol tartrate (LOPRESSOR) 25 MG tablet Take 25 mg by mouth 2 (two) times daily.  Marland Kitchen omega-3 acid ethyl esters (LOVAZA) 1 G capsule Take 2 g by mouth 2 (two) times daily.    . pantoprazole (PROTONIX) 40 MG tablet Take 40 mg by mouth 2 (two) times daily before a meal.   . potassium chloride (K-DUR) 10 MEQ tablet Take 1 tablet (10 mEq total) by mouth 2 (two) times daily as needed.  Marland Kitchen PROAIR HFA 108 (90 BASE) MCG/ACT inhaler every 4 (four) hours as needed.  . vitamin B-12 (CYANOCOBALAMIN) 1000 MCG tablet Take 1,000 mcg by mouth 2 (two) times daily.  . [DISCONTINUED] amiodarone (PACERONE) 200 MG tablet Take 1 tablet (200 mg total) by mouth daily. (Patient taking differently: Take 200 mg by mouth 2 (two) times daily. )  . [DISCONTINUED] amLODipine (NORVASC) 10 MG tablet Take 1 tablet (10 mg total) by mouth daily.  . [DISCONTINUED] clonazePAM (KLONOPIN) 0.5 MG tablet Take 0.5 mg by mouth at bedtime as needed. For sleep  . [DISCONTINUED] PRADAXA 150 MG CAPS capsule TAKE ONE CAPSULE EVERY 12 HOURS  . [DISCONTINUED] traZODone (DESYREL) 50 MG tablet Take 1 tablet (50 mg total) by mouth at bedtime. (Patient not taking: Reported on 04/29/2015)   No facility-administered encounter medications on file as of 04/29/2015.    Past Medical History  Diagnosis Date  . Dysphagia   . CAD (coronary artery disease)   . MI (myocardial infarction) ([place])     2003 with one stent placement  . Hypertension     2002  . Hypertriglyceridemia   . COPD (chronic obstructive pulmonary disease) ([place])   . GERD (gastroesophageal reflux disease)   . Carotid artery disease ([place])   . Cervical disc disease   . PONV (postoperative nausea and vomiting)   . History of kidney stones   . Dysrhythmia     AFib  . Hypothyroidism     Past Surgical History  Procedure Laterality Date  . Cholecystectomy    . Total abdominal hysterectomy w/ bilateral salpingoophorectomy    . Shoulder surgery Right     torn  rotator cuff  . Cardiac catheterization      with stent 2004  CYPHER 2.75 x 46mm coronary stent in the mid left anterior descending stenotic lesion    Last stress test showed EF of 775  . Right knee arthroscopy  2010  . Balloon dilation  05/05/2011    Procedure: BALLOON DILATION;  Surgeon: Rogene Houston, MD;  [facility];  Service: Endoscopy;  Laterality: N/A;  Venia Minks dilation  05/05/2011    Procedure: Venia Minks DILATION;  Surgeon: Rogene Houston, MD;  [facility];  Service: Endoscopy;  Laterality: N/A;  . Savory dilation  05/05/2011    Procedure: SAVORY DILATION;  Surgeon: Rogene Houston, MD;  [facility];  Service: Endoscopy;  Laterality: N/A;  .  Coronary angioplasty      1 stent  . Abdominal hysterectomy    . Cervical disc surgery    . Cataract extraction Left 2002  . Cataract extraction w/phaco Right 05/14/2013    Procedure: CATARACT EXTRACTION PHACO AND INTRAOCULAR LENS PLACEMENT RIGHT EYE;  Surgeon: Tonny Branch, MD;  Location: [facility];  Service: Ophthalmology;  Laterality: Right;  CDE:  20.45  . Cardioversion    . Amputation Right 04/12/2014    Procedure: Revision AMPUTATION Right Long DIGIT;  Surgeon: Leanora Cover, MD;  Location: [place];  Service: Orthopedics;  Laterality: Right;  . Toenail excision      Social History  reports that she has been smoking Cigarettes.  She has a 56 pack-year smoking history. She does not have any smokeless tobacco history on file. She reports that she does not drink alcohol or use illicit drugs.  Family History family history includes Diabetes in her mother; Heart attack in her father; Heart disease in her brother and sister; Heart failure in her mother; Hypertension in her sister. There is no history of Colon cancer.   Review of Systems  Constitutional: Positive for fatigue.  Eyes: Negative.   Respiratory: Negative.   Cardiovascular: Negative.   Gastrointestinal: Negative.   Endocrine: Negative.    Musculoskeletal: Positive for arthralgias.  Neurological: Negative.   Hematological: Negative.   Psychiatric/Behavioral: Negative.   All other systems reviewed and are negative.   BP 108/50 mmHg  Pulse 59  Ht 5\' 2"  (1.575 m)  Wt 188 lb (85.276 kg)  BMI 34.38 kg/m2 Repeat blood pressure 105/45 Physical Exam  Constitutional: She is oriented to person, place, and time. She appears well-developed and well-nourished.  HENT:  Head: Normocephalic.  Nose: Nose normal.  Mouth/Throat: Oropharynx is clear and moist.  Eyes: Conjunctivae are normal. Pupils are equal, round, and reactive to light.  Neck: Normal range of motion. Neck supple. No JVD present.  Cardiovascular: Normal rate, regular rhythm, S1 normal, S2 normal, normal heart sounds and intact distal pulses.  Exam reveals no gallop and no friction rub.   No murmur heard. Pulmonary/Chest: Effort normal. No respiratory distress. She has decreased breath sounds. She has no wheezes. She has no rales. She exhibits no tenderness.  Abdominal: Soft. Bowel sounds are normal. She exhibits no distension. There is no tenderness.  Musculoskeletal: Normal range of motion. She exhibits no edema or tenderness.  Lymphadenopathy:    She has no cervical adenopathy.  Neurological: She is alert and oriented to person, place, and time. Coordination normal.  Skin: Skin is warm and dry. No rash noted. No erythema.  Psychiatric: She has a normal mood and affect. Her behavior is normal. Judgment and thought content normal.    Assessment and Plan  Nursing note and vitals reviewed.

## 2015-04-30 ENCOUNTER — Encounter (HOSPITAL_COMMUNITY): Payer: Self-pay | Admitting: Internal Medicine

## 2015-05-20 ENCOUNTER — Encounter (INDEPENDENT_AMBULATORY_CARE_PROVIDER_SITE_OTHER): Payer: Self-pay | Admitting: *Deleted

## 2015-05-28 ENCOUNTER — Ambulatory Visit (HOSPITAL_COMMUNITY)
Admission: RE | Admit: 2015-05-28 | Discharge: 2015-05-28 | Disposition: A | Discharge status: Home / Self Care | Payer: Medicare Other | Source: Ambulatory Visit | Attending: Family Medicine | Admitting: Family Medicine

## 2015-05-28 ENCOUNTER — Other Ambulatory Visit (HOSPITAL_COMMUNITY): Payer: Self-pay | Admitting: Family Medicine

## 2015-05-28 DIAGNOSIS — M5136 Other intervertebral disc degeneration, lumbar region: Secondary | ICD-10-CM | POA: Insufficient documentation

## 2015-05-28 DIAGNOSIS — M546 Pain in thoracic spine: Secondary | ICD-10-CM

## 2015-05-28 DIAGNOSIS — M47814 Spondylosis without myelopathy or radiculopathy, thoracic region: Secondary | ICD-10-CM | POA: Insufficient documentation

## 2015-05-28 DIAGNOSIS — I700 Atherosclerosis of aorta: Secondary | ICD-10-CM | POA: Diagnosis not present

## 2015-05-28 DIAGNOSIS — M545 Low back pain: Secondary | ICD-10-CM | POA: Insufficient documentation

## 2015-05-28 DIAGNOSIS — M419 Scoliosis, unspecified: Secondary | ICD-10-CM | POA: Diagnosis not present

## 2015-05-28 DIAGNOSIS — M4856XA Collapsed vertebra, not elsewhere classified, lumbar region, initial encounter for fracture: Secondary | ICD-10-CM | POA: Diagnosis not present

## 2015-07-08 ENCOUNTER — Other Ambulatory Visit (HOSPITAL_COMMUNITY): Payer: Self-pay | Admitting: Family Medicine

## 2015-07-08 DIAGNOSIS — M4856XA Collapsed vertebra, not elsewhere classified, lumbar region, initial encounter for fracture: Secondary | ICD-10-CM

## 2015-07-17 ENCOUNTER — Ambulatory Visit (HOSPITAL_COMMUNITY)
Admission: RE | Admit: 2015-07-17 | Discharge: 2015-07-17 | Disposition: A | Discharge status: Home / Self Care | Payer: Medicare Other | Source: Ambulatory Visit | Attending: Family Medicine | Admitting: Family Medicine

## 2015-07-17 DIAGNOSIS — M4806 Spinal stenosis, lumbar region: Secondary | ICD-10-CM | POA: Diagnosis not present

## 2015-07-17 DIAGNOSIS — M4690 Unspecified inflammatory spondylopathy, site unspecified: Secondary | ICD-10-CM | POA: Insufficient documentation

## 2015-07-17 DIAGNOSIS — M4856XA Collapsed vertebra, not elsewhere classified, lumbar region, initial encounter for fracture: Secondary | ICD-10-CM | POA: Insufficient documentation

## 2015-07-17 DIAGNOSIS — M5136 Other intervertebral disc degeneration, lumbar region: Secondary | ICD-10-CM | POA: Insufficient documentation

## 2015-08-01 ENCOUNTER — Encounter: Payer: Self-pay | Admitting: Cardiovascular Disease

## 2015-08-01 ENCOUNTER — Ambulatory Visit (INDEPENDENT_AMBULATORY_CARE_PROVIDER_SITE_OTHER): Payer: Medicare Other | Admitting: Cardiovascular Disease

## 2015-08-01 VITALS — BP 110/54 | HR 51 | Ht 66.5 in | Wt 194.2 lb

## 2015-08-01 DIAGNOSIS — E039 Hypothyroidism, unspecified: Secondary | ICD-10-CM | POA: Diagnosis not present

## 2015-08-01 DIAGNOSIS — F17200 Nicotine dependence, unspecified, uncomplicated: Secondary | ICD-10-CM

## 2015-08-01 DIAGNOSIS — I4891 Unspecified atrial fibrillation: Secondary | ICD-10-CM | POA: Diagnosis not present

## 2015-08-01 DIAGNOSIS — R001 Bradycardia, unspecified: Secondary | ICD-10-CM

## 2015-08-01 DIAGNOSIS — I2510 Atherosclerotic heart disease of native coronary artery without angina pectoris: Secondary | ICD-10-CM

## 2015-08-01 DIAGNOSIS — I10 Essential (primary) hypertension: Secondary | ICD-10-CM

## 2015-08-01 DIAGNOSIS — R079 Chest pain, unspecified: Secondary | ICD-10-CM

## 2015-08-01 DIAGNOSIS — Z720 Tobacco use: Secondary | ICD-10-CM

## 2015-08-01 DIAGNOSIS — M549 Dorsalgia, unspecified: Secondary | ICD-10-CM | POA: Insufficient documentation

## 2015-08-01 DIAGNOSIS — M545 Low back pain: Secondary | ICD-10-CM

## 2015-08-01 MED ORDER — NITROGLYCERIN 0.4 MG/SPRAY TL SOLN: 1.0000 | Status: DC | PRN

## 2015-08-01 MED ORDER — NITROGLYCERIN 0.4 MG SL SUBL
0.4000 mg | SUBLINGUAL_TABLET | SUBLINGUAL | Status: DC | PRN
Start: 2015-08-01 — End: 2017-02-22

## 2015-08-01 NOTE — Assessment & Plan Note (Signed)
We have encouraged her to continue to work on weaning her cigarettes and smoking cessation. She will continue to work on this and does not want any assistance with chantix.  

## 2015-08-01 NOTE — Assessment & Plan Note (Signed)
Tolerating anticoagulation. Recent episode of tachycardia. Recommended she use her blood pressure cuff to check her heart rate if she has any additional episodes. If heart rate is elevated, could take extra amiodarone for suspected atrial fibrillation

## 2015-08-01 NOTE — Assessment & Plan Note (Signed)
Blood pressure is well controlled on today's visit. No changes made to the medications. 

## 2015-08-01 NOTE — Assessment & Plan Note (Signed)
She prefers to stay on amiodarone despite possible effects on her thyroid

## 2015-08-01 NOTE — Patient Instructions (Signed)
You are doing well.  Continue the 1/2 dose metoprolol in the Am, hold the Pm dose Stay on the amiodarone  Please call us if you have new issues that need to be addressed before your next appt.  Your physician wants you to follow-up in: 6 months.  You will receive a reminder letter in the mail two months in advance. If you don't receive a letter, please call our office to schedule the follow-up appointment.

## 2015-08-01 NOTE — Assessment & Plan Note (Signed)
Long discussion concerning her bradycardia. She is asymptomatic. She currently takes metoprolol 12.5 mg twice a day with amiodarone. Recommended she hold the metoprolol  in the evening.   Total encounter time more than 25 minutes  Greater than 50% was spent in counseling and coordination of care with the patient

## 2015-08-01 NOTE — Progress Notes (Signed)
Patient ID: [name], female    DOB: 01/24/1941, 75 y.o.   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 History of paroxysmal atrial fibrillation, last in September 2014,  cardioversion November 2014  In follow-up today, she reports having an episode of tachycardia, lightheadedness, flushing several weeks ago She sat down on the recliner and symptoms resolved without intervention Unclear what caused her symptoms, did have some mild chest discomfort No further episodes since that time  Overall she feels well, denies any chest pain on exertion Continues to smoke, started smoking at age 70 She is taking Toprol 12.5 mill grams twice a day for bradycardia, heart rate continues to run low at times In general heart rate low 50s up to low 60s   Primary care started her on anti-depression medication. She does not feel comfortable on this medication, does not feel like she needs this  Other past medical history reviewed  Thyroid medication recently increased. Initial TSH 18.8, down to 12 in July 2016. Managed by primary care We did talk about side effects of amiodarone and she does not want to change the medication She does not want a statin  EKG on today's visit shows normal sinus rhythm with rate 51 bpm, first-degree AV block, no significant ST or T-wave changes  Other past medical history Status post cardioversion after she failed to convert to normal sinus rhythm on amiodarone. Cardioversion 05/25/2013 which was successful in restoring normal sinus rhythm. She had converted from atrial fibrillation to atrial flutter at the time  of the cardioversion. Lab  from May 2016 showing TSH 18.8, total cholesterol 170, LDL 97  She takes Lasix twice per week, typically as needed for ankle edema. She is napping in the daytime, no regular activity. She is tolerating anticoagulation, no bleeding chronic neck pain, shoulder pain. She typically takes meloxicam when necessary She reports having a ruptured disc in her neck.     Allergies  Allergen Reactions  . Penicillins Anaphylaxis    Near death experience,   . Lipitor [Atorvastatin] Other (See Comments)    UNSPECIFIED.  Marland Kitchen Ceclor [Cefaclor] Nausea And Vomiting  . Codeine Nausea Only and Other (See Comments)    Cramps, nausea  . Sulfa Antibiotics Rash         Outpatient Encounter Prescriptions as of 08/01/2015  Medication Sig  . Acetaminophen (TYLENOL PO) Take 500-1,000 mg by mouth every 6 (six) hours as needed (pain).   . ADVAIR DISKUS 500-50 MCG/DOSE AEPB 1 puff 2 (two) times daily.   Marland Kitchen albuterol (PROVENTIL) (2.5 MG/3ML) 0.083% nebulizer solution Take 2.5 mg by nebulization every 6 (six) hours as needed. For shortness of breath  . amiodarone (PACERONE) 200 MG tablet Take 1 tablet (200 mg total) by mouth daily.  Marland Kitchen amLODipine (NORVASC) 5 MG tablet Take 1 tablet (5 mg total) by mouth daily.  . Ascorbic Acid (VITAMIN C) 100 MG tablet Take 100 mg by mouth daily.   . cholecalciferol (VITAMIN D) 1000 UNITS tablet Take 1,000 Units by mouth daily.  . clonazePAM (KLONOPIN) 1 MG tablet Take 1 mg by mouth as needed for anxiety.  Marland Kitchen  dabigatran (PRADAXA) 150 MG CAPS capsule Take 1 capsule (150 mg total) by mouth 2 (two) times daily.  Marland Kitchen ezetimibe (ZETIA) 10 MG tablet Take 1 tablet (10 mg total) by mouth daily.  . ferrous sulfate 325 (65 FE) MG tablet Take 325 mg by mouth 2 (two) times daily with a meal.  . FLUoxetine (PROZAC) 10 MG capsule Take 20 mg by mouth daily.  . furosemide (LASIX) 20 MG tablet Take 1 tablet (20 mg total) by mouth 2 (two) times daily as needed.  . isosorbide  mononitrate (IMDUR) 60 MG 24 hr tablet Take 60 mg by mouth daily.    Marland Kitchen levothyroxine (SYNTHROID, LEVOTHROID) 50 MCG tablet Take 1 tablet (50 mcg total) by mouth daily.  Marland Kitchen lisinopril-hydrochlorothiazide (PRINZIDE,ZESTORETIC) 20-25 MG per tablet Take 1 tablet by mouth daily.  . meloxicam (MOBIC) 15 MG tablet Take 1 tablet (15 mg total) by mouth daily.  . metoprolol tartrate (LOPRESSOR) 25 MG tablet Take 25 mg by mouth 2 (two) times daily.  Marland Kitchen omega-3 acid ethyl esters (LOVAZA) 1 G capsule Take 2 g by mouth 2 (two) times daily.    . pantoprazole (PROTONIX) 40 MG tablet Take 40 mg by mouth 2 (two) times daily before a meal.   . potassium chloride (K-DUR) 10 MEQ tablet Take 1 tablet (10 mEq total) by mouth 2 (two) times daily as needed.  Marland Kitchen PROAIR HFA 108 (90 BASE) MCG/ACT inhaler every 4 (four) hours as needed.  . vitamin B-12 (CYANOCOBALAMIN) 1000 MCG tablet Take 1,000 mcg by mouth 2 (two) times daily.  . nitroGLYCERIN (NITROLINGUAL) 0.4 MG/SPRAY spray Place 1 spray under the tongue every 5 (five) minutes x 3 doses as needed for chest pain.  . nitroGLYCERIN (NITROSTAT) 0.4 MG SL tablet Place 1 tablet (0.4 mg total) under the tongue every 5 (five) minutes as needed for chest pain.   No facility-administered encounter medications on file as of 08/01/2015.    Past Medical History  Diagnosis Date  . Dysphagia   . CAD (coronary artery disease)   . MI (myocardial infarction) ([place])     2003 with one stent placement  . Hypertension     2002  . Hypertriglyceridemia   . COPD (chronic obstructive pulmonary disease) ([place])   . GERD (gastroesophageal reflux disease)   . Carotid artery disease ([place])   . Cervical disc disease   . PONV (postoperative nausea and vomiting)   . History of kidney stones   . Dysrhythmia     AFib  . Hypothyroidism     Past Surgical History  Procedure Laterality Date  . Cholecystectomy    . Total abdominal hysterectomy w/ bilateral salpingoophorectomy    . Shoulder surgery  Right     torn rotator cuff  . Cardiac catheterization      with stent 2004  CYPHER 2.75 x 48mm coronary stent in the mid left anterior descending stenotic lesion    Last stress test showed EF of 775  . Right knee arthroscopy  2010  . Balloon dilation  05/05/2011    Procedure: BALLOON DILATION;  Surgeon: Rogene Houston, MD;  [facility];  Service: Endoscopy;  Laterality: N/A;  Venia Minks dilation  05/05/2011    Procedure: Venia Minks DILATION;  Surgeon: Rogene Houston, MD;  [facility];  Service: Endoscopy;  Laterality: N/A;  . Savory dilation  05/05/2011    Procedure: SAVORY DILATION;  Surgeon: Rogene Houston, MD;  [facility];  Service: Endoscopy;  Laterality:  N/A;  . Coronary angioplasty      1 stent  . Abdominal hysterectomy    . Cervical disc surgery    . Cataract extraction Left 2002  . Cataract extraction w/phaco Right 05/14/2013    Procedure: CATARACT EXTRACTION PHACO AND INTRAOCULAR LENS PLACEMENT RIGHT EYE;  Surgeon: Tonny Branch, MD;  [facility];  Service: Ophthalmology;  Laterality: Right;  CDE:  20.45  . Cardioversion    . Amputation Right 04/12/2014    Procedure: Revision AMPUTATION Right Long DIGIT;  Surgeon: Leanora Cover, MD;  Location: [place] [facility];  Service: Orthopedics;  Laterality: Right;  . Toenail excision    . Esophagogastroduodenoscopy N/A 04/25/2015    Procedure: ESOPHAGOGASTRODUODENOSCOPY (EGD);  Surgeon: Rogene Houston, MD;  [facility];  Service: Endoscopy;  Laterality: N/A;  1225  . Esophageal dilation N/A 04/25/2015    Procedure: ESOPHAGEAL DILATION;  Surgeon: Rogene Houston, MD;  [facility];  Service: Endoscopy;  Laterality: N/A;    Social History  reports that she has been smoking Cigarettes.  She has a 56 pack-year smoking history. She does not have any smokeless tobacco history on file. She reports that she does not drink alcohol or use illicit drugs.  Family History family history includes  Diabetes in her mother; Heart attack in her father; Heart disease in her brother and sister; Heart failure in her mother; Hypertension in her sister. There is no history of Colon cancer.   Review of Systems  Constitutional: Positive for fatigue.  Eyes: Negative.   Respiratory: Negative.   Cardiovascular: Negative.   Gastrointestinal: Negative.   Endocrine: Negative.   Musculoskeletal: Positive for arthralgias.  Neurological: Negative.   Hematological: Negative.   Psychiatric/Behavioral: Negative.   All other systems reviewed and are negative.   BP 110/54 mmHg  Pulse 51  Ht 5' 6.5" (1.689 m)  Wt 194 lb 4 oz (88.111 kg)  BMI 30.89 kg/m2  Physical Exam  Constitutional: She is oriented to person, place, and time. She appears well-developed and well-nourished.  HENT:  Head: Normocephalic.  Nose: Nose normal.  Mouth/Throat: Oropharynx is clear and moist.  Eyes: Conjunctivae are normal. Pupils are equal, round, and reactive to light.  Neck: Normal range of motion. Neck supple. No JVD present.  Cardiovascular: Normal rate, regular rhythm, S1 normal, S2 normal, normal heart sounds and intact distal pulses.  Exam reveals no gallop and no friction rub.   No murmur heard. Pulmonary/Chest: Effort normal. No respiratory distress. She has decreased breath sounds. She has no wheezes. She has no rales. She exhibits no tenderness.  Abdominal: Soft. Bowel sounds are normal. She exhibits no distension. There is no tenderness.  Musculoskeletal: Normal range of motion. She exhibits no edema or tenderness.  Lymphadenopathy:    She has no cervical adenopathy.  Neurological: She is alert and oriented to person, place, and time. Coordination normal.  Skin: Skin is warm and dry. No rash noted. No erythema.  Psychiatric: She has a normal mood and affect. Her behavior is normal. Judgment and thought content normal.    Assessment and Plan  Nursing note and vitals reviewed.

## 2015-08-01 NOTE — Assessment & Plan Note (Signed)
Recent episode of palpitations, tachycardia, chest pain, suspect this could've been secondary to atrial fibrillation. Currently with no symptoms of angina. Recommend she call us if she has additional episodes of discomfort

## 2015-08-01 NOTE — Assessment & Plan Note (Signed)
Recent MRI showing arthritis, stenosis She has follow-up with primary care at the beginning of February. She does not want pain medication

## 2015-08-13 ENCOUNTER — Other Ambulatory Visit: Payer: Self-pay | Admitting: Family Medicine

## 2015-08-13 DIAGNOSIS — M545 Low back pain, unspecified: Secondary | ICD-10-CM

## 2015-08-13 DIAGNOSIS — G8929 Other chronic pain: Secondary | ICD-10-CM

## 2015-08-19 ENCOUNTER — Telehealth: Payer: Self-pay | Admitting: *Deleted

## 2015-08-19 NOTE — Telephone Encounter (Signed)
Request for surgical clearance:  1. What type of surgery is being performed? Lumbar steroid injection  2. When is this surgery scheduled?  Waiting on clearance for Pradaxa to be stopped  3. Are there any medications that need to be held prior to surgery and how long? Pradaxa  4. Name of physician performing surgery?   5. What is your office phone and fax number? [phone] fax # [phone], Please call Chrys Racer with [facility]

## 2015-08-19 NOTE — Telephone Encounter (Signed)
Acceptable risk for lumbar injection Would hold anticoagulation at least 5 days prior to the injection

## 2015-08-20 NOTE — Telephone Encounter (Signed)
Routed to [facility], attn [place] [phone].

## 2015-08-29 ENCOUNTER — Other Ambulatory Visit: Payer: Self-pay

## 2015-09-03 ENCOUNTER — Other Ambulatory Visit: Payer: Self-pay | Admitting: Family Medicine

## 2015-09-03 ENCOUNTER — Ambulatory Visit
Admission: RE | Admit: 2015-09-03 | Discharge: 2015-09-03 | Disposition: A | Discharge status: Home / Self Care | Payer: Medicare Other | Source: Ambulatory Visit | Attending: Family Medicine | Admitting: Family Medicine

## 2015-09-03 ENCOUNTER — Telehealth: Payer: Self-pay

## 2015-09-03 DIAGNOSIS — G8929 Other chronic pain: Secondary | ICD-10-CM

## 2015-09-03 DIAGNOSIS — M545 Low back pain, unspecified: Secondary | ICD-10-CM

## 2015-09-03 MED ORDER — METHYLPREDNISOLONE ACETATE 40 MG/ML INJ SUSP (RADIOLOG
120.0000 mg | Freq: Once | INTRAMUSCULAR | Status: AC
  Administered 2015-09-03: 120 mg via EPIDURAL

## 2015-09-03 MED ORDER — IOHEXOL 180 MG/ML  SOLN
1.0000 mL | Freq: Once | INTRAMUSCULAR | Status: AC | PRN
  Administered 2015-09-03: 1 mL via EPIDURAL

## 2015-09-03 NOTE — Discharge Instructions (Signed)

## 2015-09-03 NOTE — Telephone Encounter (Signed)
Received cardiac clearance request for pt to proceed w/ lumbar epidural steroid injection and stop Pradaxa for 2 days prior to procedure. Per Dr. Rockey Situ, pt "ok to stop 3 days before". Faxed to [facility] [phone].

## 2015-10-01 ENCOUNTER — Ambulatory Visit
Admission: RE | Admit: 2015-10-01 | Discharge: 2015-10-01 | Disposition: A | Discharge status: Home / Self Care | Payer: Medicare Other | Source: Ambulatory Visit | Attending: Family Medicine | Admitting: Family Medicine

## 2015-10-01 DIAGNOSIS — M545 Low back pain, unspecified: Secondary | ICD-10-CM

## 2015-10-01 DIAGNOSIS — G8929 Other chronic pain: Secondary | ICD-10-CM

## 2015-10-01 MED ORDER — IOHEXOL 180 MG/ML  SOLN
1.0000 mL | Freq: Once | INTRAMUSCULAR | Status: AC | PRN
  Administered 2015-10-01: 1 mL via EPIDURAL

## 2015-10-01 MED ORDER — METHYLPREDNISOLONE ACETATE 40 MG/ML INJ SUSP (RADIOLOG
120.0000 mg | Freq: Once | INTRAMUSCULAR | Status: AC
  Administered 2015-10-01: 120 mg via EPIDURAL

## 2015-10-13 ENCOUNTER — Other Ambulatory Visit (HOSPITAL_COMMUNITY): Payer: Self-pay | Admitting: Family Medicine

## 2015-10-13 DIAGNOSIS — J984 Other disorders of lung: Secondary | ICD-10-CM

## 2015-10-20 ENCOUNTER — Ambulatory Visit (HOSPITAL_COMMUNITY)
Admission: RE | Admit: 2015-10-20 | Discharge: 2015-10-20 | Disposition: A | Discharge status: Home / Self Care | Payer: Medicare Other | Source: Ambulatory Visit | Attending: Family Medicine | Admitting: Family Medicine

## 2015-10-20 ENCOUNTER — Other Ambulatory Visit (HOSPITAL_COMMUNITY): Payer: Self-pay | Admitting: Family Medicine

## 2015-10-20 DIAGNOSIS — I700 Atherosclerosis of aorta: Secondary | ICD-10-CM | POA: Insufficient documentation

## 2015-10-20 DIAGNOSIS — I2510 Atherosclerotic heart disease of native coronary artery without angina pectoris: Secondary | ICD-10-CM | POA: Diagnosis not present

## 2015-10-20 DIAGNOSIS — J984 Other disorders of lung: Secondary | ICD-10-CM

## 2015-10-20 DIAGNOSIS — D1800 Hemangioma unspecified site: Secondary | ICD-10-CM | POA: Diagnosis not present

## 2015-10-20 DIAGNOSIS — F17200 Nicotine dependence, unspecified, uncomplicated: Secondary | ICD-10-CM | POA: Insufficient documentation

## 2015-10-20 DIAGNOSIS — J439 Emphysema, unspecified: Secondary | ICD-10-CM | POA: Insufficient documentation

## 2015-12-11 ENCOUNTER — Other Ambulatory Visit (HOSPITAL_COMMUNITY): Payer: Self-pay | Admitting: Family Medicine

## 2015-12-11 ENCOUNTER — Ambulatory Visit (HOSPITAL_COMMUNITY)
Admission: RE | Admit: 2015-12-11 | Discharge: 2015-12-11 | Disposition: A | Discharge status: Home / Self Care | Payer: Medicare Other | Source: Ambulatory Visit | Attending: Family Medicine | Admitting: Family Medicine

## 2015-12-11 DIAGNOSIS — R52 Pain, unspecified: Secondary | ICD-10-CM

## 2015-12-11 DIAGNOSIS — M85871 Other specified disorders of bone density and structure, right ankle and foot: Secondary | ICD-10-CM | POA: Diagnosis not present

## 2015-12-11 DIAGNOSIS — X58XXXA Exposure to other specified factors, initial encounter: Secondary | ICD-10-CM | POA: Insufficient documentation

## 2015-12-11 DIAGNOSIS — S92321A Displaced fracture of second metatarsal bone, right foot, initial encounter for closed fracture: Secondary | ICD-10-CM | POA: Diagnosis not present

## 2015-12-11 DIAGNOSIS — M25571 Pain in right ankle and joints of right foot: Secondary | ICD-10-CM | POA: Diagnosis present

## 2015-12-19 ENCOUNTER — Other Ambulatory Visit (HOSPITAL_COMMUNITY): Payer: Self-pay | Admitting: Family Medicine

## 2015-12-19 DIAGNOSIS — G8929 Other chronic pain: Secondary | ICD-10-CM

## 2015-12-19 DIAGNOSIS — M545 Low back pain, unspecified: Secondary | ICD-10-CM

## 2015-12-29 ENCOUNTER — Ambulatory Visit
Admission: RE | Admit: 2015-12-29 | Discharge: 2015-12-29 | Disposition: A | Discharge status: Home / Self Care | Payer: Medicare Other | Source: Ambulatory Visit | Attending: Family Medicine | Admitting: Family Medicine

## 2015-12-29 DIAGNOSIS — M545 Low back pain: Principal | ICD-10-CM

## 2015-12-29 DIAGNOSIS — G8929 Other chronic pain: Secondary | ICD-10-CM

## 2015-12-29 MED ORDER — METHYLPREDNISOLONE ACETATE 40 MG/ML INJ SUSP (RADIOLOG
120.0000 mg | Freq: Once | INTRAMUSCULAR | Status: AC
  Administered 2015-12-29: 120 mg via EPIDURAL

## 2015-12-29 MED ORDER — IOPAMIDOL (ISOVUE-M 200) INJECTION 41%
1.0000 mL | Freq: Once | INTRAMUSCULAR | Status: AC
  Administered 2015-12-29: 1 mL via EPIDURAL

## 2016-01-05 ENCOUNTER — Ambulatory Visit (INDEPENDENT_AMBULATORY_CARE_PROVIDER_SITE_OTHER): Payer: Medicare Other | Admitting: Otolaryngology

## 2016-01-05 DIAGNOSIS — H6123 Impacted cerumen, bilateral: Secondary | ICD-10-CM

## 2016-01-05 DIAGNOSIS — H903 Sensorineural hearing loss, bilateral: Secondary | ICD-10-CM | POA: Diagnosis not present

## 2016-01-13 ENCOUNTER — Other Ambulatory Visit (HOSPITAL_COMMUNITY): Payer: Self-pay | Admitting: Orthopedic Surgery

## 2016-01-13 DIAGNOSIS — M25111 Fistula, right shoulder: Secondary | ICD-10-CM

## 2016-01-20 ENCOUNTER — Ambulatory Visit (INDEPENDENT_AMBULATORY_CARE_PROVIDER_SITE_OTHER): Payer: Medicare Other | Admitting: Cardiovascular Disease

## 2016-01-20 ENCOUNTER — Encounter: Payer: Self-pay | Admitting: Cardiovascular Disease

## 2016-01-20 ENCOUNTER — Encounter (INDEPENDENT_AMBULATORY_CARE_PROVIDER_SITE_OTHER): Payer: Self-pay

## 2016-01-20 VITALS — BP 110/60 | HR 58 | Ht 67.0 in | Wt 196.5 lb

## 2016-01-20 DIAGNOSIS — I4891 Unspecified atrial fibrillation: Secondary | ICD-10-CM

## 2016-01-20 DIAGNOSIS — I2510 Atherosclerotic heart disease of native coronary artery without angina pectoris: Secondary | ICD-10-CM

## 2016-01-20 DIAGNOSIS — I10 Essential (primary) hypertension: Secondary | ICD-10-CM

## 2016-01-20 DIAGNOSIS — R001 Bradycardia, unspecified: Secondary | ICD-10-CM

## 2016-01-20 DIAGNOSIS — E785 Hyperlipidemia, unspecified: Secondary | ICD-10-CM

## 2016-01-20 DIAGNOSIS — Z720 Tobacco use: Secondary | ICD-10-CM

## 2016-01-20 DIAGNOSIS — M545 Low back pain: Secondary | ICD-10-CM

## 2016-01-20 DIAGNOSIS — R0602 Shortness of breath: Secondary | ICD-10-CM

## 2016-01-20 DIAGNOSIS — F17200 Nicotine dependence, unspecified, uncomplicated: Secondary | ICD-10-CM

## 2016-01-20 MED ORDER — NITROGLYCERIN 0.4 MG/SPRAY TL SOLN: 1.0000 | Status: DC | PRN

## 2016-01-20 NOTE — Progress Notes (Signed)
Patient ID: [name], female   DOB: April 25, 1941, 75 y.o.   MRN: [id number] Cardiology Office Note  Date:  01/20/2016   ID:  [name], DOB 03/15/1941, MRN [id number]  PCP:  Robert Bellow, MD   Chief Complaint  Patient presents with  . other    6 month f/u pt currently has poison ivy chest area. Meds reviewed verbally with pt.    HPI:  Ms. 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 History of paroxysmal atrial fibrillation, last in September 2014, cardioversion November 2014  Advance clinic visit, she had episode of severe tachycardia lightheadedness flushing  symptoms resolved without intervention  In follow-up today, she denies any new symptoms Occasionally has vague chest tightness. Unclear if this is associated with rest or exertion Symptoms are not severe Continues to smoke, started smoking at age 41 Significant bruising on her arms, trauma to her right ankle, wearing a splint  On prednisone currently for poison oak  EKG on today's visit shows normal sinus rhythm with rate 58 bpm, no significant ST or T-wave changes  Other past medical history reviewed  Thyroid medication recently increased. Initial TSH 18.8, down to 12 in July 2016. Managed by primary care We did talk about side effects of amiodarone and she does not want to change the medication She does not want a statin  Status post cardioversion after she failed to convert to normal sinus rhythm on amiodarone. Cardioversion 05/25/2013 which was successful in restoring normal sinus rhythm. She had converted from atrial fibrillation to atrial flutter at the time of the  cardioversion. Lab from May 2016 showing TSH 18.8, total cholesterol 170, LDL 97  She takes Lasix twice per week, typically as needed for ankle edema. She is napping in the daytime, no regular activity. She is tolerating anticoagulation, no bleeding chronic neck pain, shoulder pain. She typically takes meloxicam when necessary She reports having a ruptured disc in her neck.  PMH:   has a past medical history of Dysphagia; CAD (coronary artery disease); MI (myocardial infarction) ([place]); Hypertension; Hypertriglyceridemia; COPD (chronic obstructive pulmonary disease) (HCC); GERD (gastroesophageal reflux disease); Carotid artery disease ([place]); Cervical disc disease; PONV (postoperative nausea and vomiting); History of kidney stones; Dysrhythmia; and Hypothyroidism.  PSH:    Past Surgical History  Procedure Laterality Date  . Cholecystectomy    . Total abdominal hysterectomy w/ bilateral salpingoophorectomy    . Shoulder surgery Right     torn rotator cuff  . Cardiac catheterization      with stent 2004  CYPHER 2.75 x 60mm coronary stent in the mid left anterior descending stenotic lesion    Last stress test showed EF of 775  . Right knee arthroscopy  2010  . Balloon dilation  05/05/2011    Procedure: BALLOON DILATION;  Surgeon: Rogene Houston, MD;  [facility];  Service: Endoscopy;  Laterality: N/A;  Venia Minks dilation  05/05/2011    Procedure: Venia Minks DILATION;  Surgeon: Rogene Houston, MD;  [facility];  Service: Endoscopy;  Laterality: N/A;  . Savory dilation  05/05/2011    Procedure: SAVORY DILATION;  Surgeon: Rogene Houston,  MD;  [facility];  Service: Endoscopy;  Laterality: N/A;  . Coronary angioplasty      1 stent  . Abdominal hysterectomy    . Cervical disc surgery    . Cataract extraction Left 2002  . Cataract extraction w/phaco Right 05/14/2013    Procedure: CATARACT EXTRACTION PHACO AND INTRAOCULAR LENS PLACEMENT RIGHT EYE;  Surgeon:  Tonny Branch, MD;  Location: [facility];  Service: Ophthalmology;  Laterality: Right;  CDE:  20.45  . Cardioversion    . Amputation Right 04/12/2014    Procedure: Revision AMPUTATION Right Long DIGIT;  Surgeon: Leanora Cover, MD;  Location: [facility];  Service: Orthopedics;  Laterality: Right;  . Toenail excision    . Esophagogastroduodenoscopy N/A 04/25/2015    Procedure: ESOPHAGOGASTRODUODENOSCOPY (EGD);  Surgeon: Rogene Houston, MD;  [facility];  Service: Endoscopy;  Laterality: N/A;  1225  . Esophageal dilation N/A 04/25/2015    Procedure: ESOPHAGEAL DILATION;  Surgeon: Rogene Houston, MD;  [facility];  Service: Endoscopy;  Laterality: N/A;    Current Outpatient Prescriptions  Medication Sig Dispense Refill  . Acetaminophen (TYLENOL PO) Take 500-1,000 mg by mouth every 6 (six) hours as needed (pain).     . ADVAIR DISKUS 500-50 MCG/DOSE AEPB 1 puff 2 (two) times daily.     Marland Kitchen albuterol (PROVENTIL) (2.5 MG/3ML) 0.083% nebulizer solution Take 2.5 mg by nebulization every 6 (six) hours as needed. For shortness of breath    . amiodarone (PACERONE) 200 MG tablet Take 1 tablet (200 mg total) by mouth daily. 30 tablet 6  . amLODipine (NORVASC) 5 MG tablet Take 1 tablet (5 mg total) by mouth daily. 90 tablet 3  . Ascorbic Acid (VITAMIN C) 100 MG tablet Take 100 mg by mouth daily.     . Cholecalciferol (VITAMIN D3) 5000 units CAPS Take by mouth daily.    . clonazePAM (KLONOPIN) 1 MG tablet Take 1 mg by mouth as needed for anxiety.    . dabigatran (PRADAXA) 150 MG CAPS capsule Take 1 capsule (150 mg total) by mouth 2 (two) times daily. 180 capsule 3  . ezetimibe (ZETIA) 10 MG tablet Take 1 tablet (10 mg total) by mouth daily. 30 tablet 11  . ferrous sulfate 325 (65 FE) MG tablet Take 325 mg by mouth 2 (two) times daily with a meal.    . FLUoxetine (PROZAC) 10 MG capsule Take 20 mg by mouth daily.    . furosemide (LASIX) 20 MG tablet Take 1 tablet (20 mg total) by mouth 2 (two) times  daily as needed. 180 tablet 3  . isosorbide mononitrate (IMDUR) 60 MG 24 hr tablet Take 60 mg by mouth daily.      Marland Kitchen LEVOTHYROXINE SODIUM PO Take 60 mg by mouth daily.    Marland Kitchen lisinopril-hydrochlorothiazide (PRINZIDE,ZESTORETIC) 20-25 MG per tablet Take 1 tablet by mouth daily.    . meloxicam (MOBIC) 15 MG tablet Take 1 tablet (15 mg total) by mouth daily. 90 tablet 3  . metoprolol tartrate (LOPRESSOR) 25 MG tablet Take 25 mg by mouth 2 (two) times daily.    . nitroGLYCERIN (NITROSTAT) 0.4 MG SL tablet Place 1 tablet (0.4 mg total) under the tongue every 5 (five) minutes as needed for chest pain. 25 tablet 3  . omega-3 acid ethyl esters (LOVAZA) 1 G capsule Take 2 g by mouth 2 (two) times daily.      . pantoprazole (PROTONIX) 40 MG tablet Take 40 mg by mouth 2 (two)  times daily before a meal.     . potassium chloride (K-DUR) 10 MEQ tablet Take 1 tablet (10 mEq total) by mouth 2 (two) times daily as needed. 180 tablet 3  . predniSONE (DELTASONE) 10 MG tablet Take 10 mg by mouth daily with breakfast.    . PROAIR HFA 108 (90 BASE) MCG/ACT inhaler every 4 (four) hours as needed.    . vitamin B-12 (CYANOCOBALAMIN) 1000 MCG tablet Take 1,000 mcg by mouth 2 (two) times daily.    . nitroGLYCERIN (NITROLINGUAL) 0.4 MG/SPRAY spray Place 1 spray under the tongue every 5 (five) minutes x 3 doses as needed for chest pain. 12 g 12   No current facility-administered medications for this visit.     Allergies:   Penicillins; Lipitor; Ceclor; Codeine; and Sulfa antibiotics   Social History:  The patient  reports that she has been smoking Cigarettes.  She has a 56 pack-year smoking history. She does not have any smokeless tobacco history on file. She reports that she does not drink alcohol or use illicit drugs.   Family History:   family history includes Diabetes in her mother; Heart attack in her father; Heart disease in her brother and sister; Heart failure in her mother; Hypertension in her sister. There is no  history of Colon cancer.    Review of Systems: Review of Systems  Constitutional: Negative.   Respiratory: Positive for shortness of breath.   Cardiovascular: Negative.   Gastrointestinal: Negative.   Musculoskeletal: Negative.        Arthralgias  Neurological: Negative.   Endo/Heme/Allergies: Bruises/bleeds easily.  Psychiatric/Behavioral: Negative.   All other systems reviewed and are negative.    PHYSICAL EXAM: VS:  BP 110/60 mmHg  Pulse 58  Ht 5\' 7"  (1.702 m)  Wt 196 lb 8 oz (89.132 kg)  BMI 30.77 kg/m2 , BMI Body mass index is 30.77 kg/(m^2). GEN: Well nourished, well developed, in no acute distress HEENT: normal Neck: no JVD, carotid bruits, or masses Cardiac: RRR; no murmurs, rubs, or gallops,no edema  Respiratory:  clear to auscultation bilaterally, normal work of breathing GI: soft, nontender, nondistended, + BS MS: no deformity or atrophy Skin: warm and dry, no rash Neuro:  Strength and sensation are intact Psych: euthymic mood, full affect    Recent Labs: No results found for requested labs within last 365 days.    Lipid Panel No results found for: CHOL, HDL, LDLCALC, TRIG    Wt Readings from Last 3 Encounters:  01/20/16 196 lb 8 oz (89.132 kg)  08/01/15 194 lb 4 oz (88.111 kg)  07/17/15 182 lb (82.555 kg)       ASSESSMENT AND PLAN:  Atrial fibrillation, unspecified type ([place]) - Plan: EKG 12-Lead Maintaining normal sinus rhythm, no symptoms concerning for atrial fibrillation Tolerating anticoagulation Thyroid medication being adjusted by primary care She does not want to change medications   Coronary artery disease involving native coronary artery of native heart without angina pectoris -  Currently with no symptoms of angina. No further workup at this time. Continue current medication regimen. Long discussion concerning anginal symptoms She will call us for any new symptoms concerning for angina  Essential hypertension - Plan: EKG  12-Lead Blood pressure is well controlled on today's visit. No changes made to the medications.  Smoker We have encouraged her to continue to work on weaning her cigarettes and smoking cessation. She will continue to work on this and does not want any assistance with chantix.   Hyperlipidemia She  does not want a statin. She will stay on Zetia  Bradycardia Asymptomatic bradycardia, no medication changes made  Shortness of breath chronic low back pain Troubled by her low back pain. Previously seen by chiropractic, has not been back since she has developed worsening low back pain   Total encounter time more than 25 minutes  Greater than 50% was spent in counseling and coordination of care with the patient     Disposition:   F/U  6 months   Orders Placed This Encounter  Procedures  . EKG 12-Lead     Signed, Esmond Plants, M.D., Ph.D. 01/20/2016  [place], [place]

## 2016-01-20 NOTE — Patient Instructions (Signed)
Medication Instructions:   No medication changes  Labwork:  We will get labs from Dr. Karie Kirks  Testing/Procedures:  Please call if you get chest pain, We order a stress test  Follow-Up: It was a pleasure seeing you in the office today. Please call us if you have new issues that need to be addressed before your next appt.  [phone]  Your physician wants you to follow-up in: 6 months.  You will receive a reminder letter in the mail two months in advance. If you don't receive a letter, please call our office to schedule the follow-up appointment.  If you need a refill on your cardiac medications before your next appointment, please call your pharmacy.

## 2016-01-23 ENCOUNTER — Other Ambulatory Visit (HOSPITAL_COMMUNITY): Payer: Self-pay | Admitting: Family Medicine

## 2016-01-23 DIAGNOSIS — Z1231 Encounter for screening mammogram for malignant neoplasm of breast: Secondary | ICD-10-CM

## 2016-01-28 ENCOUNTER — Ambulatory Visit (HOSPITAL_COMMUNITY)
Admission: RE | Admit: 2016-01-28 | Discharge: 2016-01-28 | Disposition: A | Discharge status: Home / Self Care | Payer: Medicare Other | Source: Ambulatory Visit | Attending: Orthopedic Surgery | Admitting: Orthopedic Surgery

## 2016-01-28 DIAGNOSIS — M25711 Osteophyte, right shoulder: Secondary | ICD-10-CM | POA: Insufficient documentation

## 2016-01-28 DIAGNOSIS — M25511 Pain in right shoulder: Secondary | ICD-10-CM | POA: Insufficient documentation

## 2016-01-28 DIAGNOSIS — X58XXXS Exposure to other specified factors, sequela: Secondary | ICD-10-CM | POA: Insufficient documentation

## 2016-01-28 DIAGNOSIS — M19011 Primary osteoarthritis, right shoulder: Secondary | ICD-10-CM | POA: Insufficient documentation

## 2016-01-28 DIAGNOSIS — S46121S Laceration of muscle, fascia and tendon of long head of biceps, right arm, sequela: Secondary | ICD-10-CM | POA: Diagnosis not present

## 2016-01-28 DIAGNOSIS — M25111 Fistula, right shoulder: Secondary | ICD-10-CM

## 2016-02-05 ENCOUNTER — Ambulatory Visit (HOSPITAL_COMMUNITY): Payer: Self-pay

## 2016-02-05 ENCOUNTER — Ambulatory Visit (HOSPITAL_COMMUNITY)
Admission: RE | Admit: 2016-02-05 | Discharge: 2016-02-05 | Disposition: A | Discharge status: Home / Self Care | Payer: Medicare Other | Source: Ambulatory Visit | Attending: Family Medicine | Admitting: Family Medicine

## 2016-02-05 DIAGNOSIS — Z1231 Encounter for screening mammogram for malignant neoplasm of breast: Secondary | ICD-10-CM | POA: Diagnosis not present

## 2016-03-24 ENCOUNTER — Other Ambulatory Visit: Payer: Self-pay | Admitting: Cardiovascular Disease

## 2016-04-08 ENCOUNTER — Other Ambulatory Visit: Payer: Self-pay | Admitting: Family Medicine

## 2016-04-08 DIAGNOSIS — M542 Cervicalgia: Secondary | ICD-10-CM

## 2016-04-12 ENCOUNTER — Other Ambulatory Visit: Payer: Self-pay | Admitting: Cardiovascular Disease

## 2016-04-19 ENCOUNTER — Other Ambulatory Visit (HOSPITAL_COMMUNITY): Payer: Self-pay | Admitting: Nephrology

## 2016-04-19 DIAGNOSIS — N183 Chronic kidney disease, stage 3 unspecified: Secondary | ICD-10-CM

## 2016-04-26 ENCOUNTER — Ambulatory Visit
Admission: RE | Admit: 2016-04-26 | Discharge: 2016-04-26 | Disposition: A | Discharge status: Home / Self Care | Payer: Medicare Other | Source: Ambulatory Visit | Attending: Family Medicine | Admitting: Family Medicine

## 2016-04-26 DIAGNOSIS — M542 Cervicalgia: Secondary | ICD-10-CM

## 2016-04-26 MED ORDER — TRIAMCINOLONE ACETONIDE 40 MG/ML IJ SUSP (RADIOLOGY)
60.0000 mg | Freq: Once | INTRAMUSCULAR | Status: AC
  Administered 2016-04-26: 60 mg via EPIDURAL

## 2016-04-26 MED ORDER — IOPAMIDOL (ISOVUE-M 300) INJECTION 61%
1.0000 mL | Freq: Once | INTRAMUSCULAR | Status: AC | PRN
  Administered 2016-04-26: 1 mL via EPIDURAL

## 2016-05-05 ENCOUNTER — Other Ambulatory Visit: Payer: Self-pay | Admitting: Cardiovascular Disease

## 2016-05-06 ENCOUNTER — Ambulatory Visit (HOSPITAL_COMMUNITY): Payer: Medicare Other

## 2016-05-11 ENCOUNTER — Other Ambulatory Visit: Payer: Self-pay | Admitting: Family Medicine

## 2016-05-11 ENCOUNTER — Ambulatory Visit (HOSPITAL_COMMUNITY)
Admission: RE | Admit: 2016-05-11 | Discharge: 2016-05-11 | Disposition: A | Discharge status: Home / Self Care | Payer: Medicare Other | Source: Ambulatory Visit | Attending: Nephrology | Admitting: Nephrology

## 2016-05-11 DIAGNOSIS — N183 Chronic kidney disease, stage 3 unspecified: Secondary | ICD-10-CM

## 2016-05-11 DIAGNOSIS — M545 Low back pain: Secondary | ICD-10-CM

## 2016-05-31 ENCOUNTER — Ambulatory Visit
Admission: RE | Admit: 2016-05-31 | Discharge: 2016-05-31 | Disposition: A | Discharge status: Home / Self Care | Payer: Medicare Other | Source: Ambulatory Visit | Attending: Family Medicine | Admitting: Family Medicine

## 2016-05-31 DIAGNOSIS — M545 Low back pain: Secondary | ICD-10-CM

## 2016-05-31 MED ORDER — METHYLPREDNISOLONE ACETATE 40 MG/ML INJ SUSP (RADIOLOG
120.0000 mg | Freq: Once | INTRAMUSCULAR | Status: AC
  Administered 2016-05-31: 120 mg via EPIDURAL

## 2016-05-31 MED ORDER — IOPAMIDOL (ISOVUE-M 200) INJECTION 41%
1.0000 mL | Freq: Once | INTRAMUSCULAR | Status: AC
  Administered 2016-05-31: 1 mL via EPIDURAL

## 2016-05-31 NOTE — Discharge Instructions (Addendum)
Post Procedure Spinal Discharge Instruction Sheet  1. You may resume a regular diet and any medications that you routinely take (including pain medications).  2. No driving day of procedure.  3. Light activity throughout the rest of the day.  Do not do any strenuous work, exercise, bending or lifting.  The day following the procedure, you can resume normal physical activity but you should refrain from exercising or physical therapy for at least three days thereafter.   Common Side Effects:   Headaches- take your usual medications as directed by your physician.  Increase your fluid intake.  Caffeinated beverages may be helpful.  Lie flat in bed until your headache resolves.   Restlessness or inability to sleep- you may have trouble sleeping for the next few days.  Ask your referring physician if you need any medication for sleep.   Facial flushing or redness- should subside within a few days.   Increased pain- a temporary increase in pain a day or two following your procedure is not unusual.  Take your pain medication as prescribed by your referring physician.   Leg cramps  Please contact our office at [phone] for the following symptoms:  Fever greater than 100 degrees.  Headaches unresolved with medication after 2-3 days.  Increased swelling, pain, or redness at injection site.  Thank you for visiting our office.  You may resume Pradaxa today.

## 2016-06-30 ENCOUNTER — Other Ambulatory Visit: Payer: Self-pay | Admitting: Nephrology

## 2016-07-01 ENCOUNTER — Other Ambulatory Visit: Payer: Self-pay | Admitting: Nephrology

## 2016-07-01 DIAGNOSIS — N183 Chronic kidney disease, stage 3 unspecified: Secondary | ICD-10-CM

## 2016-07-06 ENCOUNTER — Ambulatory Visit
Admission: RE | Admit: 2016-07-06 | Discharge: 2016-07-06 | Disposition: A | Discharge status: Home / Self Care | Payer: Medicare Other | Source: Ambulatory Visit | Attending: Nephrology | Admitting: Nephrology

## 2016-07-06 DIAGNOSIS — N183 Chronic kidney disease, stage 3 unspecified: Secondary | ICD-10-CM

## 2016-07-19 ENCOUNTER — Encounter: Payer: Self-pay | Admitting: Cardiovascular Disease

## 2016-07-19 ENCOUNTER — Ambulatory Visit (INDEPENDENT_AMBULATORY_CARE_PROVIDER_SITE_OTHER): Payer: Medicare Other | Admitting: Cardiovascular Disease

## 2016-07-19 VITALS — BP 120/82 | HR 60 | Ht 67.0 in | Wt 178.8 lb

## 2016-07-19 DIAGNOSIS — N183 Chronic kidney disease, stage 3 unspecified: Secondary | ICD-10-CM | POA: Insufficient documentation

## 2016-07-19 DIAGNOSIS — R0602 Shortness of breath: Secondary | ICD-10-CM

## 2016-07-19 DIAGNOSIS — I10 Essential (primary) hypertension: Secondary | ICD-10-CM

## 2016-07-19 DIAGNOSIS — M79605 Pain in left leg: Secondary | ICD-10-CM

## 2016-07-19 DIAGNOSIS — I2510 Atherosclerotic heart disease of native coronary artery without angina pectoris: Secondary | ICD-10-CM | POA: Diagnosis not present

## 2016-07-19 DIAGNOSIS — E782 Mixed hyperlipidemia: Secondary | ICD-10-CM

## 2016-07-19 DIAGNOSIS — R0789 Other chest pain: Secondary | ICD-10-CM

## 2016-07-19 DIAGNOSIS — F17200 Nicotine dependence, unspecified, uncomplicated: Secondary | ICD-10-CM

## 2016-07-19 DIAGNOSIS — N2889 Other specified disorders of kidney and ureter: Secondary | ICD-10-CM

## 2016-07-19 DIAGNOSIS — I480 Paroxysmal atrial fibrillation: Secondary | ICD-10-CM

## 2016-07-19 NOTE — Progress Notes (Signed)
Cardiology Office Note  Date:  07/19/2016   ID:  [name], DOB 21-Jun-1941, MRN [id number]  PCP:  Robert Bellow, MD   Chief Complaint  Patient presents with  . other    6 month follow up. Meds reviewed by the pt. verbally. Pt. c/o shortness of breath and pain in left leg (calf).     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 History of paroxysmal atrial Fibrillation, September  2014, cardioversion November 2014 She is on chronic anticoagulation   In follow-up today she reports having worsening renal function over the past several months. She was on weight loss supplement, meloxicam, lisinopril HCTZ. Peak creatinine 2.5 She has been seen by nephrology Medications above held, improvement of her creatinine around 1.9 last month She continues to take Lasix 20 g daily, down from 40 mg daily No swelling on today's visit, reports having occasional swelling She did develop some mild leg swelling without Lasix, symptoms now resolved  Denies any episodes  of severe tachycardia lightheadedness   Occasionally has vague chest tightness.  Symptoms are not severe, not associated with exertion Continues to smoke, started smoking at age 53  reports that she is DNR/DNI, she has told this to family, has papers in place She reports having left lower extremity cramping pain, seems to calm on at rest, sometimes with exertion  Total cholesterol 204, LDL 131. She does not want a statin EKG on today's visit shows normal sinus rhythm with rate 60 bpm, no significant ST or T-wave changes  Other past medical history reviewed  Thyroid medication recently  increased. Initial TSH 18.8, down to 12 in July 2016. Managed by primary care We did talk about side effects of amiodarone and she does not want to change the medication She does not want a statin  Status post cardioversion after she failed to convert to normal sinus rhythm on amiodarone. Cardioversion 05/25/2013 which was successful in restoring normal sinus rhythm. She had converted from atrial fibrillation to atrial flutter at the time of the cardioversion. Lab from May 2016 showing TSH 18.8, total cholesterol 170, LDL 97  She takes Lasix twice per week, typically as needed for ankle edema. She is napping in the daytime, no regular activity. She is tolerating anticoagulation, no bleeding chronic neck pain, shoulder pain. She typically takes meloxicam when necessary She reports having a ruptured disc in her neck.  PMH:   has a past medical history of CAD (coronary artery disease); Carotid artery disease ([place]); Cervical disc disease; Chronic renal impairment, stage 3 (moderate) (07/19/2016); COPD (chronic obstructive pulmonary disease) ([place]); Dysphagia; Dysrhythmia; GERD (gastroesophageal reflux disease); History of kidney stones; Hypertension; Hypertriglyceridemia; Hypothyroidism; MI (myocardial infarction); and PONV (postoperative nausea and vomiting).  PSH:    Past Surgical History:  Procedure Laterality Date  . ABDOMINAL HYSTERECTOMY    . AMPUTATION Right 04/12/2014   Procedure: Revision AMPUTATION Right Long DIGIT;  Surgeon: Leanora Cover, MD;  Location: [facility];  Service: Orthopedics;  Laterality: Right;  . BALLOON DILATION  05/05/2011   Procedure: BALLOON DILATION;  Surgeon: Rogene Houston, MD;  [facility];  Service: Endoscopy;  Laterality:  N/A;  . CARDIAC CATHETERIZATION     with stent 2004  CYPHER 2.75 x 26mm coronary stent in the mid left anterior descending stenotic lesion    Last stress test showed EF of 775  . CARDIOVERSION    . CATARACT EXTRACTION Left 2002  .  CATARACT EXTRACTION W/PHACO Right 05/14/2013   Procedure: CATARACT EXTRACTION PHACO AND INTRAOCULAR LENS PLACEMENT RIGHT EYE;  Surgeon: Tonny Branch, MD;  [facility];  Service: Ophthalmology;  Laterality: Right;  CDE:  20.45  . CERVICAL DISC SURGERY    . CHOLECYSTECTOMY    . CORONARY ANGIOPLASTY     1 stent  . ESOPHAGEAL DILATION N/A 04/25/2015   Procedure: ESOPHAGEAL DILATION;  Surgeon: Rogene Houston, MD;  [facility];  Service: Endoscopy;  Laterality: N/A;  . ESOPHAGOGASTRODUODENOSCOPY N/A 04/25/2015   Procedure: ESOPHAGOGASTRODUODENOSCOPY (EGD);  Surgeon: Rogene Houston, MD;  [facility];  Service: Endoscopy;  Laterality: N/A;  1225  . MALONEY DILATION  05/05/2011   Procedure: MALONEY DILATION;  Surgeon: Rogene Houston, MD;  [facility];  Service: Endoscopy;  Laterality: N/A;  . right knee arthroscopy  2010  . SAVORY DILATION  05/05/2011   Procedure: SAVORY DILATION;  Surgeon: Rogene Houston, MD;  [facility];  Service: Endoscopy;  Laterality: N/A;  . SHOULDER SURGERY Right    torn rotator cuff  . TOENAIL EXCISION    . TOTAL ABDOMINAL HYSTERECTOMY W/ BILATERAL SALPINGOOPHORECTOMY      Current Outpatient Prescriptions  Medication Sig Dispense Refill  . Acetaminophen (TYLENOL PO) Take 500-1,000 mg by mouth every 6 (six) hours as needed (pain).     . ADVAIR DISKUS 500-50 MCG/DOSE AEPB 1 puff 2 (two) times daily.     Marland Kitchen albuterol (PROVENTIL) (2.5 MG/3ML) 0.083% nebulizer solution Take 2.5 mg by nebulization every 6 (six) hours as needed. For shortness of breath    . amiodarone (PACERONE) 200 MG tablet TAKE ONE (1) TABLET EACH DAY 30 tablet 3  . amLODipine (NORVASC) 5 MG tablet TAKE ONE (1) TABLET EACH DAY 90 tablet 3  . Ascorbic Acid (VITAMIN C) 100 MG tablet Take 100 mg by mouth daily.     . Cholecalciferol (VITAMIN D3) 5000 units CAPS Take by mouth daily.    . clonazePAM (KLONOPIN) 1 MG tablet Take 1 mg by mouth as needed for  anxiety.    . dabigatran (PRADAXA) 150 MG CAPS capsule Take 1 capsule (150 mg total) by mouth 2 (two) times daily. 180 capsule 3  . ezetimibe (ZETIA) 10 MG tablet Take 1 tablet (10 mg total) by mouth daily. 30 tablet 11  . ferrous sulfate 325 (65 FE) MG tablet Take 325 mg by mouth 2 (two) times daily with a meal.    . FLUoxetine (PROZAC) 10 MG capsule Take 20 mg by mouth daily.    . furosemide (LASIX) 20 MG tablet Take 20 mg by mouth daily.    . isosorbide mononitrate (IMDUR) 60 MG 24 hr tablet Take 60 mg by mouth daily.      Marland Kitchen LEVOTHYROXINE SODIUM PO Take 60 mg by mouth daily.    . metoprolol tartrate (LOPRESSOR) 25 MG tablet Take 25 mg by mouth 2 (two) times daily.    . nitroGLYCERIN (NITROLINGUAL) 0.4 MG/SPRAY spray Place 1 spray under the tongue every 5 (five) minutes x 3 doses as needed for chest pain. 12 g 12  . nitroGLYCERIN (NITROSTAT) 0.4 MG SL tablet Place 1 tablet (0.4 mg  total) under the tongue every 5 (five) minutes as needed for chest pain. 25 tablet 3  . omega-3 acid ethyl esters (LOVAZA) 1 G capsule Take 2 g by mouth 2 (two) times daily.      . pantoprazole (PROTONIX) 40 MG tablet Take 40 mg by mouth 2 (two) times daily before a meal.     . PROAIR HFA 108 (90 BASE) MCG/ACT inhaler every 4 (four) hours as needed.    . ranitidine (ZANTAC) 150 MG capsule Take 150 mg by mouth daily as needed for heartburn.    . vitamin B-12 (CYANOCOBALAMIN) 1000 MCG tablet Take 1,000 mcg by mouth 2 (two) times daily.     No current facility-administered medications for this visit.      Allergies:   Penicillins; Ceclor [cefaclor]; Codeine; Lipitor [atorvastatin]; and Sulfa antibiotics   Social History:  The patient  reports that she has been smoking Cigarettes.  She has a 56.00 pack-year smoking history. She has never used smokeless tobacco. She reports that she does not drink alcohol or use drugs.   Family History:   family history includes Diabetes in her mother; Heart attack in her father;  Heart disease in her brother and sister; Heart failure in her mother; Hypertension in her sister.    Review of Systems: Review of Systems  Constitutional: Negative.   Respiratory: Negative.   Cardiovascular: Positive for chest pain.  Gastrointestinal: Negative.   Musculoskeletal: Positive for myalgias.  Neurological: Negative.   Psychiatric/Behavioral: Negative.   All other systems reviewed and are negative.    PHYSICAL EXAM: VS:  BP 120/82 (BP Location: Left Arm, Patient Position: Sitting, Cuff Size: Normal)   Pulse 60   Ht 5\' 7"  (1.702 m)   Wt 178 lb 12 oz (81.1 kg)   BMI 28.00 kg/m  , BMI Body mass index is 28 kg/m. GEN: Well nourished, well developed, in no acute distress  HEENT: normal  Neck: no JVD, carotid bruits, or masses Cardiac: RRR; no murmurs, rubs, or gallops,no edema , she does have appreciable pulses in her lower extremities Respiratory:  mildly decreased breath sounds throughout, normal work of breathing GI: soft, nontender, nondistended, + BS MS: no deformity or atrophy  Skin: warm and dry, no rash Neuro:  Strength and sensation are intact Psych: euthymic mood, full affect    Recent Labs: No results found for requested labs within last 8760 hours.    Lipid Panel No results found for: CHOL, HDL, LDLCALC, TRIG    Wt Readings from Last 3 Encounters:  07/19/16 178 lb 12 oz (81.1 kg)  01/20/16 196 lb 8 oz (89.1 kg)  08/01/15 194 lb 4 oz (88.1 kg)       ASSESSMENT AND PLAN:  Paroxysmal atrial fibrillation ([facility]) - Plan: EKG 12-Lead Maintaining normal sinus rhythm, recommended she stay on anticoagulation Normal sinus rhythm on today's visit Essential hypertension - Plan: EKG 12-Lead  Coronary artery disease involving native coronary artery of native heart without angina pectoris - Plan: EKG 12-Lead Currently with no symptoms of angina. No further workup at this time. Continue current medication regimen.  Pain of left lower extremity Pain in  left lower extremity etiology unclear, good lower extremity pulses, less likely PAD. She is not on a statin, Unable to exclude muscle cramping If symptoms get worse would order lower extremity arterial Doppler  Mixed hyperlipidemia She does not want a statin,  Encouraged her to stay on Zetia  SOB (shortness of breath) Long history of smoking who continues  to smoke Underlying COPD  Chest discomfort Atypical chest discomfort, seems to calm on at rest  Smoker We have encouraged her to continue to work on weaning her cigarettes and smoking cessation. She will continue to work on this and does not want any assistance with chantix.   Chronic renal impairment, stage 3 (moderate) Creatinine up to 1.9 on recent lab work last month Suggested if there is no significant leg swelling to skip a day of Lasix Clinically with no significant swelling today   Total encounter time more than 25 minutes  Greater than 50% was spent in counseling and coordination of care with the patient   Disposition:   F/U  6 months   Orders Placed This Encounter  Procedures  . EKG 12-Lead     Signed, Esmond Plants, M.D., Ph.D. 07/19/2016  [place], [place]

## 2016-07-19 NOTE — Patient Instructions (Signed)
Medication Instructions:   Consider cutting the amlodipine in 1/2 daily  Skip the lasix/furosemide when swelling is gone   Labwork:  No new labs needed  Testing/Procedures:  No further testing at this time   I recommend watching educational videos on topics of interest to you at:       [email or web address]  Enter code: [email or web address]    Follow-Up: It was a pleasure seeing you in the office today. Please call us if you have new issues that need to be addressed before your next appt.  [phone]  Your physician wants you to follow-up in: 6 months.  You will receive a reminder letter in the mail two months in advance. If you don't receive a letter, please call our office to schedule the follow-up appointment.  If you need a refill on your cardiac medications before your next appointment, please call your pharmacy.

## 2016-08-11 ENCOUNTER — Other Ambulatory Visit: Payer: Self-pay | Admitting: Cardiovascular Disease

## 2016-08-23 ENCOUNTER — Telehealth: Payer: Self-pay | Admitting: Cardiovascular Disease

## 2016-08-23 NOTE — Telephone Encounter (Signed)
Spoke w/ pt.  Advised her that Dr. Rockey Situ reviewed her recent labs and recommends she follow the instructions that I went over this am and to take lasix 40 mg daily for the next 3 days, then resume 10 mg daily. She verbalizes understanding and will call back later this week if her sx do not improve.

## 2016-08-23 NOTE — Telephone Encounter (Signed)
Spoke w/ pt.  She reports swelling in her legs, ankles and calves; she states that both legs up to her calves are tight and painful. Pt reports that she drinks a lot of water during the day. Advised her to limit her fluids & only drink when she is thirsty, to wear compression hose or ACE bandage and to elevate feet when sitting.  Pt does not have compression hose; she bought some "support panty hose" at the drug store, but they do not provide compression. Pt reports that she was taking lasix 40 mg daily, but this was decreased to 10 mg daily 2/2 kidney issues. Her chart shows that she takes 20 mg, but she reports that she takes 10 mg every day. I do not see a recent BMET on pt, though she reports that she her kidney doctor at [facility] has been checking this on a monthly basis (left message for them to fax most recent BMET for your review). Advised pt that I will make Dr. Rockey Situ aware of her concerns and call her back w/ his recommendation.

## 2016-08-23 NOTE — Telephone Encounter (Signed)
Pt c/o swelling: STAT is pt has developed SOB within 24 hours  1. How long have you been experiencing swelling? About a month  2. Where is the swelling located? Both feet and legs   3.  Are you currently taking a "fluid pill"?*no, due to kidney issues  4.  Are you currently SOB? Yes, but currently has bronchitis  5.  Have you traveled recently?

## 2016-08-27 ENCOUNTER — Other Ambulatory Visit: Payer: Self-pay | Admitting: Nephrology

## 2016-08-27 DIAGNOSIS — I129 Hypertensive chronic kidney disease with stage 1 through stage 4 chronic kidney disease, or unspecified chronic kidney disease: Secondary | ICD-10-CM

## 2016-09-06 ENCOUNTER — Other Ambulatory Visit: Payer: Medicare Other

## 2016-09-06 ENCOUNTER — Ambulatory Visit
Admission: RE | Admit: 2016-09-06 | Discharge: 2016-09-06 | Disposition: A | Discharge status: Home / Self Care | Payer: Medicare Other | Source: Ambulatory Visit | Attending: Nephrology | Admitting: Nephrology

## 2016-09-06 DIAGNOSIS — I129 Hypertensive chronic kidney disease with stage 1 through stage 4 chronic kidney disease, or unspecified chronic kidney disease: Secondary | ICD-10-CM

## 2016-09-30 ENCOUNTER — Other Ambulatory Visit: Payer: Self-pay | Admitting: Family Medicine

## 2016-09-30 DIAGNOSIS — M542 Cervicalgia: Secondary | ICD-10-CM

## 2016-10-12 ENCOUNTER — Inpatient Hospital Stay
Admission: RE | Admit: 2016-10-12 | Discharge: 2016-10-12 | Disposition: A | Discharge status: Home / Self Care | Payer: Medicare Other | Source: Ambulatory Visit | Attending: Family Medicine | Admitting: Family Medicine

## 2016-10-14 ENCOUNTER — Ambulatory Visit (HOSPITAL_COMMUNITY)
Admission: RE | Admit: 2016-10-14 | Discharge: 2016-10-14 | Disposition: A | Discharge status: Home / Self Care | Payer: Medicare Other | Source: Ambulatory Visit | Attending: Family Medicine | Admitting: Family Medicine

## 2016-10-14 ENCOUNTER — Other Ambulatory Visit (HOSPITAL_COMMUNITY): Payer: Self-pay | Admitting: Family Medicine

## 2016-10-14 DIAGNOSIS — M79671 Pain in right foot: Secondary | ICD-10-CM

## 2016-10-15 ENCOUNTER — Ambulatory Visit
Admission: RE | Admit: 2016-10-15 | Discharge: 2016-10-15 | Disposition: A | Discharge status: Home / Self Care | Payer: Medicare Other | Source: Ambulatory Visit | Attending: Family Medicine | Admitting: Family Medicine

## 2016-10-15 DIAGNOSIS — M542 Cervicalgia: Secondary | ICD-10-CM

## 2016-10-15 MED ORDER — TRIAMCINOLONE ACETONIDE 40 MG/ML IJ SUSP (RADIOLOGY)
60.0000 mg | Freq: Once | INTRAMUSCULAR | Status: AC
  Administered 2016-10-15: 60 mg via EPIDURAL

## 2016-10-15 MED ORDER — IOPAMIDOL (ISOVUE-M 300) INJECTION 61%
1.0000 mL | Freq: Once | INTRAMUSCULAR | Status: AC | PRN
  Administered 2016-10-15: 1 mL via EPIDURAL

## 2016-10-15 NOTE — Discharge Instructions (Signed)
Post Procedure Spinal Discharge Instruction Sheet  1. You may resume a regular diet and any medications that you routinely take (including pain medications).  2. No driving day of procedure.  3. Light activity throughout the rest of the day.  Do not do any strenuous work, exercise, bending or lifting.  The day following the procedure, you can resume normal physical activity but you should refrain from exercising or physical therapy for at least three days thereafter.   Common Side Effects:   Headaches- take your usual medications as directed by your physician.  Increase your fluid intake.  Caffeinated beverages may be helpful.  Lie flat in bed until your headache resolves.   Restlessness or inability to sleep- you may have trouble sleeping for the next few days.  Ask your referring physician if you need any medication for sleep.   Facial flushing or redness- should subside within a few days.   Increased pain- a temporary increase in pain a day or two following your procedure is not unusual.  Take your pain medication as prescribed by your referring physician.   Leg cramps  Please contact our office at [phone] for the following symptoms:  Fever greater than 100 degrees.  Headaches unresolved with medication after 2-3 days.  Increased swelling, pain, or redness at injection site.  Thank you for visiting our office.   You may resume Pradaxa today.

## 2016-10-22 ENCOUNTER — Other Ambulatory Visit: Payer: Self-pay | Admitting: Family Medicine

## 2016-10-22 DIAGNOSIS — M542 Cervicalgia: Secondary | ICD-10-CM

## 2016-11-01 ENCOUNTER — Ambulatory Visit
Admission: RE | Admit: 2016-11-01 | Discharge: 2016-11-01 | Disposition: A | Discharge status: Home / Self Care | Payer: Medicare Other | Source: Ambulatory Visit | Attending: Family Medicine | Admitting: Family Medicine

## 2016-11-01 DIAGNOSIS — M542 Cervicalgia: Secondary | ICD-10-CM

## 2016-11-01 MED ORDER — TRIAMCINOLONE ACETONIDE 40 MG/ML IJ SUSP (RADIOLOGY): 60.0000 mg | Freq: Once | INTRAMUSCULAR | Status: DC

## 2016-11-01 MED ORDER — IOPAMIDOL (ISOVUE-M 200) INJECTION 41%: 1.0000 mL | Freq: Once | INTRAMUSCULAR | Status: DC

## 2016-12-13 ENCOUNTER — Ambulatory Visit (INDEPENDENT_AMBULATORY_CARE_PROVIDER_SITE_OTHER): Payer: Medicare Other | Admitting: Podiatry

## 2016-12-13 ENCOUNTER — Ambulatory Visit (INDEPENDENT_AMBULATORY_CARE_PROVIDER_SITE_OTHER): Payer: Medicare Other

## 2016-12-13 ENCOUNTER — Encounter: Payer: Self-pay | Admitting: Podiatry

## 2016-12-13 DIAGNOSIS — M7751 Other enthesopathy of right foot: Secondary | ICD-10-CM | POA: Diagnosis not present

## 2016-12-13 DIAGNOSIS — M2011 Hallux valgus (acquired), right foot: Secondary | ICD-10-CM | POA: Diagnosis not present

## 2016-12-13 DIAGNOSIS — M778 Other enthesopathies, not elsewhere classified: Secondary | ICD-10-CM

## 2016-12-13 DIAGNOSIS — M779 Enthesopathy, unspecified: Secondary | ICD-10-CM

## 2016-12-13 DIAGNOSIS — M1A3710 Chronic gout due to renal impairment, right ankle and foot, without tophus (tophi): Secondary | ICD-10-CM

## 2016-12-13 MED ORDER — COLCHICINE 0.6 MG PO TABS: ORAL_TABLET | ORAL | 1 refills | Status: DC

## 2016-12-13 NOTE — Progress Notes (Signed)
   Subjective:    Patient ID: [name], female    DOB: 06/22/1941, 76 y.o.   MRN: [id number]  HPI: She presents today with regards to her first metatarsophalangeal joint of the right foot on referral from her primary doctor due to a history of gout and a painful first metatarsophalangeal joint. States that she has a history of kidney failure and kidney disease and may need to be considered for dialysis.    Review of Systems  All other systems reviewed and are negative.      Objective:   Physical Exam: Vital signs are stable she is alert and oriented 3 pulses are strongly palpable. Neurologic sensorium is intact. Hallux abductovalgus deformity is noted right foot. She has tenderness on range of motion with mild erythema and edema overlying the medial eminence of the first metatarsophalangeal joint. She states that she has a history of kidney failure she has mild tennis on palpation of the bunion deformity radiographically evaluation does demonstrates Martel sign significant for gout.      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  Plan: I injected the area today after sterile Betadine skin prep 's and local anesthetic but a prescription for colchicine which she will start after the initiation of another gout attack. Because of the kidney issue she cannot take allopurinol.

## 2016-12-18 ENCOUNTER — Other Ambulatory Visit: Payer: Self-pay | Admitting: Cardiovascular Disease

## 2017-01-07 ENCOUNTER — Other Ambulatory Visit (HOSPITAL_COMMUNITY): Payer: Self-pay | Admitting: Family Medicine

## 2017-01-07 DIAGNOSIS — R221 Localized swelling, mass and lump, neck: Secondary | ICD-10-CM

## 2017-01-07 DIAGNOSIS — Z1231 Encounter for screening mammogram for malignant neoplasm of breast: Secondary | ICD-10-CM

## 2017-01-14 NOTE — Progress Notes (Signed)
Cardiology Office Note  Date:  01/18/2017   ID:  [name], DOB 06-18-1941, MRN [id number]  PCP:  Lemmie Evens, MD   Chief Complaint  Patient presents with  . other    69mo f/u. Pt c/o sob, cp at times, concerned of bilateral leg swelling, raised areas to both sides of neck. Reviewed meds with pt verbally.    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  In follow-up today she reports that she has had worsening leg swelling PMD has started ABX for cellulitis Reports having several courses of antibiotics with no significant improvement in her symptoms She has been taking her diuretics regularly Denies excessive fluid or salt intake Legs down frequently  EKG personally reviewed by myself on todays visit Shows normal sinus rhythm with rate 50 bpm, nonspecific ST abnormality, rare PVCs  Other past medical history reviewed Peak creatinine 2.5, seen by nephrology Medications improvement of her creatinine around 1.9 last month  Continues to smoke, started smoking at age 21  reports that she is DNR/DNI, she has told this to family, has papers in place She reports having left lower extremity cramping pain, seems to calm on at rest, sometimes with exertion  Total cholesterol 204, LDL 131. She does not want a statin  Thyroid medication recently increased. Initial TSH 18.8, down to 12 in July 2016. Managed by primary care We did talk about side effects of amiodarone and she does not want to change the  medication She does not want a statin  Status post cardioversion after she failed to convert to normal sinus rhythm on amiodarone. Cardioversion 05/25/2013 which was successful in restoring normal sinus rhythm. She had converted from atrial fibrillation to atrial flutter at the time of the cardioversion. Lab from May 2016 showing TSH 18.8, total cholesterol 170, LDL 97  She takes Lasix twice per week, typically as needed for ankle edema. She is napping in the daytime, no regular activity. She is tolerating anticoagulation, no bleeding chronic neck pain, shoulder pain. She typically takes meloxicam when necessary She reports having a ruptured disc in her neck.  PMH:   has a past medical history of CAD (coronary artery disease); Carotid artery disease ([name]); Cervical disc disease; Chronic renal impairment, stage 3 (moderate) (07/19/2016); COPD (chronic obstructive pulmonary disease) ([place]); Dysphagia; Dysrhythmia; GERD (gastroesophageal reflux disease); History of kidney stones; Hypertension; Hypertriglyceridemia; Hypothyroidism; MI (myocardial infarction) ([name]); and PONV (postoperative nausea and vomiting).  PSH:    Past Surgical History:  Procedure Laterality Date  . ABDOMINAL HYSTERECTOMY    . AMPUTATION Right 04/12/2014   Procedure: Revision AMPUTATION Right Long DIGIT;  Surgeon: Leanora Cover, MD;  Location: [place];  Service: Orthopedics;  Laterality: Right;  . BALLOON DILATION  05/05/2011   Procedure: BALLOON DILATION;  Surgeon: Rogene Houston, MD;  Location[facility];  Service: Endoscopy;  Laterality: N/A;  . CARDIAC CATHETERIZATION     with stent 2004  CYPHER 2.75 x 71mm coronary stent in the mid left anterior descending stenotic  lesion    Last stress test showed EF of 775  . CARDIOVERSION    . CATARACT EXTRACTION Left 2002  . CATARACT EXTRACTION W/PHACO Right 05/14/2013   Procedure: CATARACT EXTRACTION PHACO AND INTRAOCULAR LENS PLACEMENT RIGHT EYE;  Surgeon: Tonny Branch, MD;   Location: [facility];  Service: Ophthalmology;  Laterality: Right;  CDE:  20.45  . CERVICAL DISC SURGERY    . CHOLECYSTECTOMY    . CORONARY ANGIOPLASTY     1 stent  . ESOPHAGEAL DILATION N/A 04/25/2015   Procedure: ESOPHAGEAL DILATION;  Surgeon: Rogene Houston, MD;  [facility];  Service: Endoscopy;  Laterality: N/A;  . ESOPHAGOGASTRODUODENOSCOPY N/A 04/25/2015   Procedure: ESOPHAGOGASTRODUODENOSCOPY (EGD);  Surgeon: Rogene Houston, MD;  [facility];  Service: Endoscopy;  Laterality: N/A;  1225  . MALONEY DILATION  05/05/2011   Procedure: MALONEY DILATION;  Surgeon: Rogene Houston, MD;  [facility];  Service: Endoscopy;  Laterality: N/A;  . right knee arthroscopy  2010  . SAVORY DILATION  05/05/2011   Procedure: SAVORY DILATION;  Surgeon: Rogene Houston, MD;  [facility];  Service: Endoscopy;  Laterality: N/A;  . SHOULDER SURGERY Right    torn rotator cuff  . TOENAIL EXCISION    . TOTAL ABDOMINAL HYSTERECTOMY W/ BILATERAL SALPINGOOPHORECTOMY      Current Outpatient Prescriptions  Medication Sig Dispense Refill  . Acetaminophen (TYLENOL PO) Take 500-1,000 mg by mouth every 6 (six) hours as needed (pain).     . ADVAIR DISKUS 500-50 MCG/DOSE AEPB 1 puff 2 (two) times daily.     Marland Kitchen albuterol (PROVENTIL) (2.5 MG/3ML) 0.083% nebulizer solution Take 2.5 mg by nebulization every 6 (six) hours as needed. For shortness of breath    . amiodarone (PACERONE) 200 MG tablet TAKE ONE (1) TABLET EACH DAY 30 tablet 3  . amLODipine (NORVASC) 5 MG tablet TAKE ONE (1) TABLET EACH DAY 90 tablet 3  . Ascorbic Acid (VITAMIN C) 1000 MG tablet Take 1,000 mg by mouth daily.    . calcitRIOL (ROCALTROL) 0.5 MCG capsule Take 0.5 mcg by mouth daily.    . Cetirizine HCl 10 MG CAPS Take 10 mg by mouth daily.    . Cholecalciferol (VITAMIN D3) 5000 units CAPS Take by mouth daily.    . clindamycin (CLEOCIN) 300 MG capsule Take 300 mg by mouth QID.    Marland Kitchen clonazePAM (KLONOPIN)  1 MG tablet Take 1 mg by mouth as needed for anxiety.     . colchicine 0.6 MG tablet Take one tablet by mouth three times daily at the first sign of a gout attack.  Decrease to one tablet daily if gastric upset occurs. 60 tablet 1  . dabigatran (PRADAXA) 150 MG CAPS capsule Take 1 capsule (150 mg total) by mouth 2 (two) times daily. 180 capsule 3  . furosemide (LASIX) 40 MG tablet Take 40 mg by mouth 2 (two) times daily.    . hydrochlorothiazide (HYDRODIURIL) 25 MG tablet     . isosorbide mononitrate (IMDUR) 60 MG 24 hr tablet Take 60 mg by mouth daily.      Marland Kitchen levothyroxine (SYNTHROID, LEVOTHROID) 125 MCG tablet Take 125 mcg by mouth daily.    . metoprolol tartrate (LOPRESSOR) 25 MG tablet Take 25 mg by mouth 2 (two) times daily.    . nitroGLYCERIN (NITROLINGUAL) 0.4 MG/SPRAY spray Place 1 spray under the tongue every 5 (five) minutes x 3 doses as needed for chest pain. 12 g 12  .  nitroGLYCERIN (NITROSTAT) 0.4 MG SL tablet Place 1 tablet (0.4 mg total) under the tongue every 5 (five) minutes as needed for chest pain. 25 tablet 3  . olopatadine (PATANOL) 0.1 % ophthalmic solution Place 1 drop into both eyes daily.    Marland Kitchen omega-3 acid ethyl esters (LOVAZA) 1 G capsule Take 2 g by mouth 2 (two) times daily.      Marland Kitchen PROAIR HFA 108 (90 BASE) MCG/ACT inhaler every 4 (four) hours as needed.    . ranitidine (ZANTAC) 150 MG capsule Take 150 mg by mouth daily as needed for heartburn.    . vitamin B-12 (CYANOCOBALAMIN) 1000 MCG tablet Take 1,000 mcg by mouth 2 (two) times daily.     No current facility-administered medications for this visit.      Allergies:   Penicillins; Ceclor [cefaclor]; Codeine; Lipitor [atorvastatin]; and Sulfa antibiotics   Social History:  The patient  reports that she has been smoking Cigarettes.  She has a 56.00 pack-year smoking history. She has never used smokeless tobacco. She reports that she does not drink alcohol or use drugs.   Family History:   family history includes  Diabetes in her mother; Heart attack in her father; Heart disease in her brother and sister; Heart failure in her mother; Hypertension in her sister.    Review of Systems: Review of Systems  Constitutional: Negative.   Respiratory: Positive for shortness of breath.   Cardiovascular: Positive for leg swelling.  Gastrointestinal: Negative.   Musculoskeletal: Negative.   Neurological: Negative.   Psychiatric/Behavioral: Negative.   All other systems reviewed and are negative.    PHYSICAL EXAM: VS:  BP 118/62 (BP Location: Left Arm, Patient Position: Sitting, Cuff Size: Normal)   Pulse (!) 50   Ht 5\' 7"  (1.702 m)   Wt 201 lb 12 oz (91.5 kg)   BMI 31.60 kg/m  , BMI Body mass index is 31.6 kg/m. GEN: Well nourished, well developed, in no acute distress  HEENT: normal  Neck: no JVD, carotid bruits, or masses Cardiac: RRR; no murmurs, rubs, or gallops, trace lower extremity edema , she does have appreciable pulses in her lower extremities Respiratory:  mildly decreased breath sounds throughout, normal work of breathing GI: soft, nontender, nondistended, + BS MS: no deformity or atrophy  Skin: warm and dry, no rash Neuro:  Strength and sensation are intact Psych: euthymic mood, full affect    Recent Labs: No results found for requested labs within last 8760 hours.    Lipid Panel No results found for: CHOL, HDL, LDLCALC, TRIG    Wt Readings from Last 3 Encounters:  01/18/17 201 lb 12 oz (91.5 kg)  07/19/16 178 lb 12 oz (81.1 kg)  01/20/16 196 lb 8 oz (89.1 kg)       ASSESSMENT AND PLAN:  Paroxysmal atrial fibrillation (HCC) - Plan: EKG 12-Lead Maintaining normal sinus rhythm, recommended she stay on anticoagulation Normal sinus rhythm on today's visit  Essential hypertension - Plan: EKG 12-Lead Blood pressure is well controlled on today's visit. No changes made to the medications.  Coronary artery disease involving native coronary artery of native heart without  angina pectoris - Plan: EKG 12-Lead Currently with no symptoms of angina. No further workup at this time. Continue current medication regimen.  Mixed hyperlipidemia She does not want a statin,  Encouraged her to stay on Zetia  SOB (shortness of breath) Long history of smoking who continues to smoke Underlying COPD  Chest discomfort Atypical chest discomfort, seems to calm  on at rest  Leg swelling Suspect dependent edema, recommended leg elevation, compression hose, Ace wraps, hold amlodipine Symptoms should improve in the next several weeks  Smoker We have encouraged her to continue to work on weaning her cigarettes and smoking cessation. She will continue to work on this and does not want any assistance with chantix.   Chronic renal impairment, stage 3 (moderate) Worsening leg swelling, recommended she proceed with Lasix very cautiously   Total encounter time more than 25 minutes  Greater than 50% was spent in counseling and coordination of care with the patient   Disposition:   F/U  2 months   Orders Placed This Encounter  Procedures  . EKG 12-Lead     Signed, Esmond Plants, M.D., Ph.D. 01/18/2017  [place], [place]

## 2017-01-18 ENCOUNTER — Encounter: Payer: Self-pay | Admitting: Cardiovascular Disease

## 2017-01-18 ENCOUNTER — Ambulatory Visit (INDEPENDENT_AMBULATORY_CARE_PROVIDER_SITE_OTHER): Payer: Medicare Other | Admitting: Cardiovascular Disease

## 2017-01-18 VITALS — BP 118/62 | HR 50 | Ht 67.0 in | Wt 201.8 lb

## 2017-01-18 DIAGNOSIS — I25118 Atherosclerotic heart disease of native coronary artery with other forms of angina pectoris: Secondary | ICD-10-CM

## 2017-01-18 DIAGNOSIS — I10 Essential (primary) hypertension: Secondary | ICD-10-CM | POA: Diagnosis not present

## 2017-01-18 DIAGNOSIS — I480 Paroxysmal atrial fibrillation: Secondary | ICD-10-CM

## 2017-01-18 DIAGNOSIS — N183 Chronic kidney disease, stage 3 unspecified: Secondary | ICD-10-CM

## 2017-01-18 DIAGNOSIS — E782 Mixed hyperlipidemia: Secondary | ICD-10-CM

## 2017-01-18 DIAGNOSIS — F17200 Nicotine dependence, unspecified, uncomplicated: Secondary | ICD-10-CM

## 2017-01-18 DIAGNOSIS — R0602 Shortness of breath: Secondary | ICD-10-CM | POA: Diagnosis not present

## 2017-01-18 MED ORDER — PROMETHAZINE HCL 25 MG PO TABS: 25.0000 mg | ORAL_TABLET | Freq: Four times a day (QID) | ORAL | 1 refills | Status: DC | PRN

## 2017-01-18 NOTE — Patient Instructions (Addendum)
ACE wraps for legs   Medication Instructions:   Please stop amlodipine  Labwork:  No new labs needed  Testing/Procedures:  No further testing at this time   Follow-Up: It was a pleasure seeing you in the office today. Please call us if you have new issues that need to be addressed before your next appt.  [phone]  Your physician wants you to follow-up in: 2 month.    If you need a refill on your cardiac medications before your next appointment, please call your pharmacy.

## 2017-01-19 ENCOUNTER — Ambulatory Visit (HOSPITAL_COMMUNITY)
Admission: RE | Admit: 2017-01-19 | Discharge: 2017-01-19 | Disposition: A | Discharge status: Home / Self Care | Payer: Medicare Other | Source: Ambulatory Visit | Attending: Family Medicine | Admitting: Family Medicine

## 2017-01-19 DIAGNOSIS — Z981 Arthrodesis status: Secondary | ICD-10-CM | POA: Insufficient documentation

## 2017-01-19 DIAGNOSIS — I672 Cerebral atherosclerosis: Secondary | ICD-10-CM | POA: Diagnosis not present

## 2017-01-19 DIAGNOSIS — R221 Localized swelling, mass and lump, neck: Secondary | ICD-10-CM | POA: Insufficient documentation

## 2017-01-19 DIAGNOSIS — M19011 Primary osteoarthritis, right shoulder: Secondary | ICD-10-CM | POA: Diagnosis not present

## 2017-01-19 DIAGNOSIS — I6529 Occlusion and stenosis of unspecified carotid artery: Secondary | ICD-10-CM | POA: Diagnosis not present

## 2017-01-19 DIAGNOSIS — M4312 Spondylolisthesis, cervical region: Secondary | ICD-10-CM | POA: Insufficient documentation

## 2017-01-20 ENCOUNTER — Ambulatory Visit (HOSPITAL_COMMUNITY): Payer: Medicare Other

## 2017-01-21 ENCOUNTER — Other Ambulatory Visit: Payer: Self-pay | Admitting: *Deleted

## 2017-01-21 DIAGNOSIS — M7989 Other specified soft tissue disorders: Secondary | ICD-10-CM

## 2017-01-25 ENCOUNTER — Other Ambulatory Visit: Payer: Self-pay | Admitting: Orthopaedic Surgery

## 2017-01-25 ENCOUNTER — Encounter: Payer: Self-pay | Admitting: Orthopaedic Surgery

## 2017-01-25 ENCOUNTER — Ambulatory Visit: Payer: Medicare Other

## 2017-01-25 ENCOUNTER — Ambulatory Visit (INDEPENDENT_AMBULATORY_CARE_PROVIDER_SITE_OTHER): Payer: Medicare Other

## 2017-01-25 ENCOUNTER — Other Ambulatory Visit: Payer: Self-pay | Admitting: Cardiovascular Disease

## 2017-01-25 ENCOUNTER — Ambulatory Visit (INDEPENDENT_AMBULATORY_CARE_PROVIDER_SITE_OTHER): Payer: Medicare Other | Admitting: Orthopaedic Surgery

## 2017-01-25 VITALS — BP 142/63 | HR 63 | Temp 98.0°F | Ht 67.0 in | Wt 201.0 lb

## 2017-01-25 DIAGNOSIS — M25512 Pain in left shoulder: Secondary | ICD-10-CM | POA: Diagnosis not present

## 2017-01-25 DIAGNOSIS — F17210 Nicotine dependence, cigarettes, uncomplicated: Secondary | ICD-10-CM | POA: Diagnosis not present

## 2017-01-25 DIAGNOSIS — I739 Peripheral vascular disease, unspecified: Secondary | ICD-10-CM

## 2017-01-25 NOTE — Patient Instructions (Signed)
Steps to Quit Smoking Smoking tobacco can be bad for your health. It can also affect almost every organ in your body. Smoking puts you and people around you at risk for many serious long-lasting (chronic) diseases. Quitting smoking is hard, but it is one of the best things that you can do for your health. It is never too late to quit. What are the benefits of quitting smoking? When you quit smoking, you lower your risk for getting serious diseases and conditions. They can include:  Lung cancer or lung disease.  Heart disease.  Stroke.  Heart attack.  Not being able to have children (infertility).  Weak bones (osteoporosis) and broken bones (fractures).  If you have coughing, wheezing, and shortness of breath, those symptoms may get better when you quit. You may also get sick less often. If you are pregnant, quitting smoking can help to lower your chances of having a baby of low birth weight. What can I do to help me quit smoking? Talk with your doctor about what can help you quit smoking. Some things you can do (strategies) include:  Quitting smoking totally, instead of slowly cutting back how much you smoke over a period of time.  Going to in-person counseling. You are more likely to quit if you go to many counseling sessions.  Using resources and support systems, such as: ? Online chats with a counselor. ? Phone quitlines. ? Printed self-help materials. ? Support groups or group counseling. ? Text messaging programs. ? Mobile phone apps or applications.  Taking medicines. Some of these medicines may have nicotine in them. If you are pregnant or breastfeeding, do not take any medicines to quit smoking unless your doctor says it is okay. Talk with your doctor about counseling or other things that can help you.  Talk with your doctor about using more than one strategy at the same time, such as taking medicines while you are also going to in-person counseling. This can help make  quitting easier. What things can I do to make it easier to quit? Quitting smoking might feel very hard at first, but there is a lot that you can do to make it easier. Take these steps:  Talk to your family and friends. Ask them to support and encourage you.  Call phone quitlines, reach out to support groups, or work with a counselor.  Ask people who smoke to not smoke around you.  Avoid places that make you want (trigger) to smoke, such as: ? Bars. ? Parties. ? Smoke-break areas at work.  Spend time with people who do not smoke.  Lower the stress in your life. Stress can make you want to smoke. Try these things to help your stress: ? Getting regular exercise. ? Deep-breathing exercises. ? Yoga. ? Meditating. ? Doing a body scan. To do this, close your eyes, focus on one area of your body at a time from head to toe, and notice which parts of your body are tense. Try to relax the muscles in those areas.  Download or buy apps on your mobile phone or tablet that can help you stick to your quit plan. There are many free apps, such as QuitGuide from the CDC (Centers for Disease Control and Prevention). You can find more support from smokefree.gov and other websites.  This information is not intended to replace advice given to you by your health care provider. Make sure you discuss any questions you have with your health care provider. Document Released: 04/17/2009 Document   Revised: 02/17/2016 Document Reviewed: 11/05/2014 Elsevier Interactive Patient Education  2018 [employer]  

## 2017-01-25 NOTE — Progress Notes (Signed)
Subjective:    Patient ID: [name], female    DOB: 1940-08-29, 76 y.o.   MRN: [id number]  HPI She has had pain in the left shoulder for over three months.  She has pain with overhead use.  She has pain rolling over on it at night.  She has no numbness, no trauma, no redness.  She is on Coumadin for heart problems.  She has taken Tylenol, used ice and heat and it still hurts.  She had rotator cuff surgery by Dr. Sharol Given on the right shoulder in the past.  She has seen Dr. Karie Kirks for this and he asked that I see her.  I have reviewed his notes.   Review of Systems  Respiratory: Negative for cough and shortness of breath.   Cardiovascular: Negative for chest pain and leg swelling.  Endocrine: Positive for cold intolerance.  Musculoskeletal: Positive for arthralgias.  Allergic/Immunologic: Positive for environmental allergies.   Past Medical History:  Diagnosis Date  . CAD (coronary artery disease)   . Carotid artery disease ([place])   . Cervical disc disease   . Chronic renal impairment, stage 3 (moderate) 07/19/2016  . COPD (chronic obstructive pulmonary disease) ([place])   . Dysphagia   . Dysrhythmia    AFib  . GERD (gastroesophageal reflux disease)   . History of kidney stones   . Hypertension    2002  . Hypertriglyceridemia   . Hypothyroidism   . MI (myocardial infarction) ([place])    2003 with one stent placement  . PONV (postoperative nausea and vomiting)     Past Surgical History:  Procedure Laterality Date  . ABDOMINAL HYSTERECTOMY    . AMPUTATION Right 04/12/2014   Procedure: Revision AMPUTATION Right Long DIGIT;  Surgeon: Leanora Cover, MD;  Location: [place];  Service: Orthopedics;  Laterality: Right;  . BALLOON DILATION  05/05/2011   Procedure: BALLOON DILATION;  Surgeon: Rogene Houston, MD;  [facility];  Service: Endoscopy;  Laterality: N/A;  . CARDIAC CATHETERIZATION     with stent 2004  CYPHER 2.75 x 53mm coronary stent in the mid left anterior  descending stenotic lesion    Last stress test showed EF of 775  . CARDIOVERSION    . CATARACT EXTRACTION Left 2002  . CATARACT EXTRACTION W/PHACO Right 05/14/2013   Procedure: CATARACT EXTRACTION PHACO AND INTRAOCULAR LENS PLACEMENT RIGHT EYE;  Surgeon: Tonny Branch, MD;  [facility];  Service: Ophthalmology;  Laterality: Right;  CDE:  20.45  . CERVICAL DISC SURGERY    . CHOLECYSTECTOMY    . CORONARY ANGIOPLASTY     1 stent  . ESOPHAGEAL DILATION N/A 04/25/2015   Procedure: ESOPHAGEAL DILATION;  Surgeon: Rogene Houston, MD;  [facility];  Service: Endoscopy;  Laterality: N/A;  . ESOPHAGOGASTRODUODENOSCOPY N/A 04/25/2015   Procedure: ESOPHAGOGASTRODUODENOSCOPY (EGD);  Surgeon: Rogene Houston, MD;  [facility];  Service: Endoscopy;  Laterality: N/A;  1225  . MALONEY DILATION  05/05/2011   Procedure: MALONEY DILATION;  Surgeon: Rogene Houston, MD;  [facility];  Service: Endoscopy;  Laterality: N/A;  . right knee arthroscopy  2010  . SAVORY DILATION  05/05/2011   Procedure: SAVORY DILATION;  Surgeon: Rogene Houston, MD;  [facility];  Service: Endoscopy;  Laterality: N/A;  . SHOULDER SURGERY Right    torn rotator cuff  . TOENAIL EXCISION    . TOTAL ABDOMINAL HYSTERECTOMY W/ BILATERAL SALPINGOOPHORECTOMY      Current  Outpatient Prescriptions on File Prior to Visit  Medication Sig Dispense Refill  . Acetaminophen (TYLENOL PO) Take 500-1,000 mg by mouth every 6 (six) hours as needed (pain).     . ADVAIR DISKUS 500-50 MCG/DOSE AEPB 1 puff 2 (two) times daily.     Marland Kitchen albuterol (PROVENTIL) (2.5 MG/3ML) 0.083% nebulizer solution Take 2.5 mg by nebulization every 6 (six) hours as needed. For shortness of breath    . amiodarone (PACERONE) 200 MG tablet TAKE ONE (1) TABLET EACH DAY 30 tablet 3  . Ascorbic Acid (VITAMIN C) 1000 MG tablet Take 1,000 mg by mouth daily.    . calcitRIOL (ROCALTROL) 0.5 MCG capsule Take 0.5 mcg by mouth daily.    .  Cetirizine HCl 10 MG CAPS Take 10 mg by mouth daily.    . Cholecalciferol (VITAMIN D3) 5000 units CAPS Take by mouth daily.    . clindamycin (CLEOCIN) 300 MG capsule Take 300 mg by mouth QID.    Marland Kitchen clonazePAM (KLONOPIN) 1 MG tablet Take 1 mg by mouth as needed for anxiety.     . colchicine 0.6 MG tablet Take one tablet by mouth three times daily at the first sign of a gout attack.  Decrease to one tablet daily if gastric upset occurs. 60 tablet 1  . dabigatran (PRADAXA) 150 MG CAPS capsule Take 1 capsule (150 mg total) by mouth 2 (two) times daily. 180 capsule 3  . furosemide (LASIX) 40 MG tablet Take 40 mg by mouth 2 (two) times daily.    . hydrochlorothiazide (HYDRODIURIL) 25 MG tablet     . isosorbide mononitrate (IMDUR) 60 MG 24 hr tablet Take 60 mg by mouth daily.      Marland Kitchen levothyroxine (SYNTHROID, LEVOTHROID) 125 MCG tablet Take 125 mcg by mouth daily.    . metoprolol tartrate (LOPRESSOR) 25 MG tablet Take 25 mg by mouth 2 (two) times daily.    . nitroGLYCERIN (NITROLINGUAL) 0.4 MG/SPRAY spray Place 1 spray under the tongue every 5 (five) minutes x 3 doses as needed for chest pain. 12 g 12  . nitroGLYCERIN (NITROSTAT) 0.4 MG SL tablet Place 1 tablet (0.4 mg total) under the tongue every 5 (five) minutes as needed for chest pain. 25 tablet 3  . olopatadine (PATANOL) 0.1 % ophthalmic solution Place 1 drop into both eyes daily.    Marland Kitchen omega-3 acid ethyl esters (LOVAZA) 1 G capsule Take 2 g by mouth 2 (two) times daily.      Marland Kitchen PROAIR HFA 108 (90 BASE) MCG/ACT inhaler every 4 (four) hours as needed.    . promethazine (PHENERGAN) 25 MG tablet Take 1 tablet (25 mg total) by mouth every 6 (six) hours as needed for nausea or vomiting. 30 tablet 1  . ranitidine (ZANTAC) 150 MG capsule Take 150 mg by mouth daily as needed for heartburn.    . vitamin B-12 (CYANOCOBALAMIN) 1000 MCG tablet Take 1,000 mcg by mouth 2 (two) times daily.     No current facility-administered medications on file prior to visit.      Social History   Social History  . Marital status: Widowed    Spouse name: N/A  . Number of children: 1  . Years of education: N/A   Occupational History  .  Retired   Social History Main Topics  . Smoking status: Current Every Day Smoker    Packs/day: 1.00    Years: 56.00    Types: Cigarettes  . Smokeless tobacco: Never Used  . Alcohol use No  Comment: every 2-3 months  . Drug use: No  . Sexual activity: No     Comment: one pack a day   Other Topics Concern  . Not on file   Social History Narrative  . No narrative on file    Family History  Problem Relation Age of Onset  . Diabetes Mother   . Heart failure Mother   . Heart attack Father   . Heart disease Sister   . Heart disease Brother   . Hypertension Sister   . Colon cancer Neg Hx     BP (!) 142/63   Pulse 63   Temp 98 F (36.7 C)   Ht 5\' 7"  (1.702 m)   Wt 201 lb (91.2 kg)   BMI 31.48 kg/m       Objective:   Physical Exam  Constitutional: She is oriented to person, place, and time. She appears well-developed and well-nourished.  HENT:  Head: Normocephalic and atraumatic.  Eyes: Pupils are equal, round, and reactive to light. Conjunctivae and EOM are normal.  Neck: Normal range of motion. Neck supple.  Cardiovascular: Normal rate, regular rhythm and intact distal pulses.   Pulmonary/Chest: Effort normal.  Abdominal: Soft.  Musculoskeletal: She exhibits tenderness (Left shoulder tender, ROM nearly full but painful in the extremes.  Grips normal, NV intact, no redness or swelling.  Neck negative.).  Neurological: She is alert and oriented to person, place, and time. She displays normal reflexes. No cranial nerve deficit. She exhibits normal muscle tone. Coordination normal.  Skin: Skin is warm and dry.  Psychiatric: She has a normal mood and affect. Her behavior is normal. Judgment and thought content normal.    X-rays were done of the left shoulder, reported separately.       Assessment & Plan:   Encounter Diagnoses  Name Primary?  . Pain in joint of left shoulder Yes  . Cigarette nicotine dependence without complication    PROCEDURE NOTE:  The patient request injection, verbal consent was obtained.  The left shoulder was prepped appropriately after time out was performed.   Sterile technique was observed and injection of 1 cc of Depo-Medrol 40 mg with several cc's of plain xylocaine. Anesthesia was provided by ethyl chloride and a 20-gauge needle was used to inject the shoulder area. A posterior approach was used.  The injection was tolerated well.  A band aid dressing was applied.  The patient was advised to apply ice later today and tomorrow to the injection sight as needed.  Call if any problem.  Precautions discussed.   Electronically Signed Sanjuana Kava, MD 7/24/20183:49 PM

## 2017-02-10 ENCOUNTER — Ambulatory Visit (HOSPITAL_COMMUNITY)
Admission: RE | Admit: 2017-02-10 | Discharge: 2017-02-10 | Disposition: A | Discharge status: Home / Self Care | Payer: Medicare Other | Source: Ambulatory Visit | Attending: Family Medicine | Admitting: Family Medicine

## 2017-02-10 DIAGNOSIS — Z1231 Encounter for screening mammogram for malignant neoplasm of breast: Secondary | ICD-10-CM | POA: Diagnosis not present

## 2017-02-15 ENCOUNTER — Ambulatory Visit: Payer: Medicare Other

## 2017-02-15 DIAGNOSIS — I739 Peripheral vascular disease, unspecified: Secondary | ICD-10-CM

## 2017-02-16 ENCOUNTER — Telehealth: Payer: Self-pay | Admitting: Cardiovascular Disease

## 2017-02-16 LAB — VAS US LOWER EXTREMITY ARTERIAL DUPLEX
LEFT PERO MID DIA: 0 cm/s
LEFT PERO MID SYS: -52 cm/s
LEFT POPLITEAL DIST DYS VEL: 0 cm/s
LEFT POPLITEAL PROX DYS VEL: 0 cm/s
LEFT SFA MID VEL: 0 cm/s
LEFT SFA PROX DYS VEL: 0 cm/s
LPTIBMIDDIA: 0 cm/s
LSFADISTDYSV: 0 cm/s
LSFDPSV: -127 cm/s
LSFMPSV: -221 cm/s
LSFPPSV: -199 cm/s
Left ant tibial mid sys: -86 cm/s
Left ant tibial mid sys: 0 cm/s
Left popliteal dist sys PSV: -104 cm/s
Left popliteal prox sys PSV: 124 cm/s
RATIBMIDSYS: -94 cm/s
RIGHT ANT MID TIBIAL DIA PSV: 0 cm/s
RIGHT POPLITEAL PROX EDV: 0 cm/s
RIGHT POST TIB MID DIA: 0 cm/s
RIGHT POST TIB MID SYS: 98 cm/s
RIGHT SUPER FEMORAL PROX EDV: 0 cm/s
RPOPDPSV: -100 cm/s
RSFPPSV: -233 cm/s
RTPOPDISDIA: 0 cm/s
RTSFADISTDIA: 0 cm/s
RTSFAMIDDIA: 0 cm/s
Right popliteal prox sys PSV: 122 cm/s
Right super femoral dist sys PSV: -135 cm/s
Right super femoral mid sys PSV: -435 cm/s
left post tibial mid sys: 87 cm/s

## 2017-02-16 NOTE — Telephone Encounter (Signed)
Pt would like a call regarding her leg ultrasound that was done yesterday. Please call.

## 2017-02-16 NOTE — Telephone Encounter (Signed)
Test has not resulted yet.  Will call her when Dr. Rockey Situ has reviewed.

## 2017-02-21 ENCOUNTER — Other Ambulatory Visit: Payer: Self-pay | Admitting: Cardiovascular Disease

## 2017-02-21 NOTE — Progress Notes (Signed)
Cardiology Office Note  Date:  02/22/2017   ID:  [name], DOB 1941-06-11, MRN [id number]  PCP:  Lemmie Evens, MD   Chief Complaint  Patient presents with  . other    1 month follow up as well as discuss lower arterial u/s. Meds reviewed by the pt.'s bottles. Pt. c/o bilateral leg pain.     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  On her last clinic visit she had significant leg swelling Previously treated with antibiotics for possible cellulitis We held her amlodipine In follow-up today, leg swelling has improved, she is using Ace wraps  She reports her GFR has dropped. Lab work not available, not in the [facility]  Lower extremity arterial Doppler reviewed with her 50 to 75% Mid right SFA She reports that her legs hurt all the time, unclear she is having claudication symptoms  Breathing somewhat worse, she feels it is just chronic bronchitis In the past has required prednisone Unable to tolerate antibiotics except for Z-Pak  EKG personally reviewed by myself on todays visit Shows normal sinus rhythm with rate 53 bpm, nonspecific ST abnormality, rare PVCs  Other past medical history reviewed Peak creatinine 2.5,   Continues to smoke, started smoking at age 56  reports that she is DNR/DNI, she has told this to family, has papers in place She reports having left lower extremity cramping pain, seems to calm on at rest, sometimes with exertion  Total cholesterol 204, LDL 131.  She does not want a statin  Thyroid medication recently increased. Initial TSH 18.8, down to 12 in July 2016. Managed by primary care We did talk about side effects of amiodarone and she does not want to change the medication She does not want a statin  Status post cardioversion after she failed to convert to normal sinus rhythm on amiodarone. Cardioversion 05/25/2013 which was successful in restoring normal sinus rhythm. She had converted from atrial fibrillation to atrial flutter at the time of the cardioversion. Lab from May 2016 showing TSH 18.8, total cholesterol 170, LDL 97  She takes Lasix twice per week, typically as needed for ankle edema. She is napping in the daytime, no regular activity. She is tolerating anticoagulation, no bleeding chronic neck pain, shoulder pain. She typically takes meloxicam when necessary She reports having a ruptured disc in her neck.  PMH:   has a past medical history of CAD (coronary artery disease); Carotid artery disease ([place]); Cervical disc disease; Chronic renal impairment, stage 3 (moderate) (07/19/2016); COPD (chronic obstructive pulmonary disease) ([place]); Dysphagia; Dysrhythmia; GERD (gastroesophageal reflux disease); History of kidney stones; Hypertension; Hypertriglyceridemia; Hypothyroidism; MI (myocardial infarction) ([place]); and PONV (postoperative nausea and vomiting).  PSH:    Past Surgical History:  Procedure Laterality Date  . ABDOMINAL HYSTERECTOMY    . AMPUTATION Right 04/12/2014   Procedure: Revision AMPUTATION Right Long DIGIT;  Surgeon: Leanora Cover, MD;  Location: [facility];  Service: Orthopedics;  Laterality: Right;  . BALLOON DILATION  05/05/2011  Procedure: BALLOON DILATION;  Surgeon: Rogene Houston, MD;  [facility];  Service: Endoscopy;  Laterality: N/A;  . CARDIAC CATHETERIZATION     with stent 2004  CYPHER 2.75 x 73mm coronary stent in the mid left anterior descending stenotic lesion    Last stress test showed EF of  775  . CARDIOVERSION    . CATARACT EXTRACTION Left 2002  . CATARACT EXTRACTION W/PHACO Right 05/14/2013   Procedure: CATARACT EXTRACTION PHACO AND INTRAOCULAR LENS PLACEMENT RIGHT EYE;  Surgeon: Tonny Branch, MD;  [facility];  Service: Ophthalmology;  Laterality: Right;  CDE:  20.45  . CERVICAL DISC SURGERY    . CHOLECYSTECTOMY    . CORONARY ANGIOPLASTY     1 stent  . ESOPHAGEAL DILATION N/A 04/25/2015   Procedure: ESOPHAGEAL DILATION;  Surgeon: Rogene Houston, MD;  [facility];  Service: Endoscopy;  Laterality: N/A;  . ESOPHAGOGASTRODUODENOSCOPY N/A 04/25/2015   Procedure: ESOPHAGOGASTRODUODENOSCOPY (EGD);  Surgeon: Rogene Houston, MD;  [facility];  Service: Endoscopy;  Laterality: N/A;  1225  . MALONEY DILATION  05/05/2011   Procedure: MALONEY DILATION;  Surgeon: Rogene Houston, MD;  [facility];  Service: Endoscopy;  Laterality: N/A;  . right knee arthroscopy  2010  . SAVORY DILATION  05/05/2011   Procedure: SAVORY DILATION;  Surgeon: Rogene Houston, MD;  [facility];  Service: Endoscopy;  Laterality: N/A;  . SHOULDER SURGERY Right    torn rotator cuff  . TOENAIL EXCISION    . TOTAL ABDOMINAL HYSTERECTOMY W/ BILATERAL SALPINGOOPHORECTOMY      Current Outpatient Prescriptions  Medication Sig Dispense Refill  . Acetaminophen (TYLENOL PO) Take 500-1,000 mg by mouth every 6 (six) hours as needed (pain).     . ADVAIR DISKUS 500-50 MCG/DOSE AEPB 1 puff 2 (two) times daily.     Marland Kitchen albuterol (PROVENTIL) (2.5 MG/3ML) 0.083% nebulizer solution Take 2.5 mg by nebulization every 6 (six) hours as needed. For shortness of breath    . amiodarone (PACERONE) 200 MG tablet TAKE ONE (1) TABLET EACH DAY 30 tablet 3  . Ascorbic Acid (VITAMIN C) 1000 MG tablet Take 1,000 mg by mouth daily.    . calcitRIOL (ROCALTROL) 0.5 MCG capsule Take 0.5 mcg by mouth daily.    . Cetirizine HCl 10 MG CAPS Take 10 mg by mouth daily.    . Cholecalciferol (VITAMIN  D3) 5000 units CAPS Take by mouth daily.    . clindamycin (CLEOCIN) 300 MG capsule Take 300 mg by mouth QID.    Marland Kitchen clonazePAM (KLONOPIN) 1 MG tablet Take 1 mg by mouth as needed for anxiety.     . dabigatran (PRADAXA) 150 MG CAPS capsule Take 1 capsule (150 mg total) by mouth 2 (two) times daily. 180 capsule 3  . furosemide (LASIX) 40 MG tablet Take 40 mg by mouth 2 (two) times daily.    . isosorbide mononitrate (IMDUR) 60 MG 24 hr tablet Take 60 mg by mouth daily.      Marland Kitchen levothyroxine (SYNTHROID, LEVOTHROID) 125 MCG tablet Take 125 mcg by mouth daily.    . metoprolol tartrate (LOPRESSOR) 25 MG tablet Take 25 mg by mouth 2 (two) times daily.    . nitroGLYCERIN (NITROLINGUAL) 0.4 MG/SPRAY spray PLACE ONE SPRAY UNDER THE TONGUE EVERY 5MINUTES FOR 3 DOSES AS NEEDEDFOR CHEST PAIN. 12 g 1  . nitroGLYCERIN (NITROSTAT) 0.4 MG SL tablet Place 1 tablet (0.4 mg total) under the tongue every 5 (  five) minutes as needed for chest pain. 25 tablet 3  . olopatadine (PATANOL) 0.1 % ophthalmic solution Place 1 drop into both eyes daily.    Marland Kitchen omega-3 acid ethyl esters (LOVAZA) 1 G capsule Take 2 g by mouth 2 (two) times daily.      Marland Kitchen PROAIR HFA 108 (90 BASE) MCG/ACT inhaler every 4 (four) hours as needed.    . promethazine (PHENERGAN) 25 MG tablet Take 1 tablet (25 mg total) by mouth every 6 (six) hours as needed for nausea or vomiting. 30 tablet 1  . ranitidine (ZANTAC) 150 MG capsule Take 150 mg by mouth daily as needed for heartburn.    . vitamin B-12 (CYANOCOBALAMIN) 1000 MCG tablet Take 1,000 mcg by mouth 2 (two) times daily.    . colchicine 0.6 MG tablet Take one tablet by mouth three times daily at the first sign of a gout attack.  Decrease to one tablet daily if gastric upset occurs. (Patient not taking: Reported on 02/22/2017) 60 tablet 1   No current facility-administered medications for this visit.      Allergies:   Penicillins; Ceclor [cefaclor]; Codeine; Lipitor [atorvastatin]; and Sulfa antibiotics    Social History:  The patient  reports that she has been smoking Cigarettes.  She has a 56.00 pack-year smoking history. She has never used smokeless tobacco. She reports that she does not drink alcohol or use drugs.   Family History:   family history includes Diabetes in her mother; Heart attack in her father; Heart disease in her brother and sister; Heart failure in her mother; Hypertension in her sister.    Review of Systems: Review of Systems  Constitutional: Negative.   Respiratory: Positive for cough and shortness of breath.   Cardiovascular: Positive for leg swelling.  Gastrointestinal: Negative.   Musculoskeletal: Negative.   Neurological: Negative.   Psychiatric/Behavioral: Negative.   All other systems reviewed and are negative.    PHYSICAL EXAM: VS:  BP 140/70 (BP Location: Left Arm, Patient Position: Sitting, Cuff Size: Normal)   Pulse (!) 53   Ht 5\' 7"  (1.702 m)   Wt 204 lb 12 oz (92.9 kg)   BMI 32.07 kg/m  , BMI Body mass index is 32.07 kg/m. GEN: Well nourished, well developed, in no acute distress  HEENT: normal  Neck: no JVD, carotid bruits, or masses Cardiac: RRR; no murmurs, rubs, or gallops, trace lower extremity nonpitting edema, Ace wraps in place Respiratory:  Moderately decreased breath sounds throughout, normal work of breathing GI: soft, nontender, nondistended, + BS MS: no deformity or atrophy  Skin: warm and dry, no rash Neuro:  Strength and sensation are intact Psych: euthymic mood, full affect    Recent Labs: No results found for requested labs within last 8760 hours.    Lipid Panel No results found for: CHOL, HDL, LDLCALC, TRIG    Wt Readings from Last 3 Encounters:  02/22/17 204 lb 12 oz (92.9 kg)  01/25/17 201 lb (91.2 kg)  01/18/17 201 lb 12 oz (91.5 kg)       ASSESSMENT AND PLAN:  Paroxysmal atrial fibrillation (HCC) - Plan: EKG 12-Lead Maintaining normal sinus rhythm,on anticoagulation  Essential hypertension - Plan:  EKG 12-Lead Blood pressure Borderline elevated, No changes made to the medications.  Coronary artery disease involving native coronary artery of native heart without angina pectoris - Plan: EKG 12-Lead Currently with no symptoms of angina. No further workup at this time. Continue current medication regimen.  Mixed hyperlipidemia She does not want  a statin,  Encouraged her to stay on Zetia  PAD Lower extremity arterial stenosis, 50-75% stenosis mid right SFA Given chronic renal insufficiency, not a good candidate at this time for intervention We'll closely monitor renal function Repeat lower extremity arterial Doppler 6 months  SOB (shortness of breath) Long history of smoking who continues to smoke Underlying COPD Suggested she talk with primary care about a course of prednisone if her symptoms of wheezing, respiratory status worsen  She has chronic bronchitis  Leg swelling dependent edema, recommended leg elevation, compression hose, Ace wraps,  Amlodipine held on her last clinic visit with moderate improvement  Smoker We have encouraged her to continue to work on weaning her cigarettes and smoking cessation. She will continue to work on this and does not want any assistance with chantix.   Chronic renal impairment, stage 3 (moderate) recommended she proceed with Lasix very cautiously Followed by nephrology Most recent lab work not available but she reports dramatic decline in GFR possibly from overdiuresis   Total encounter time more than 25 minutes  Greater than 50% was spent in counseling and coordination of care with the patient  Disposition:   F/U  6 months   No orders of the defined types were placed in this encounter.    Signed, Esmond Plants, M.D., Ph.D. 02/22/2017  [place], [place]

## 2017-02-22 ENCOUNTER — Encounter: Payer: Self-pay | Admitting: Cardiovascular Disease

## 2017-02-22 ENCOUNTER — Ambulatory Visit (INDEPENDENT_AMBULATORY_CARE_PROVIDER_SITE_OTHER): Payer: Medicare Other | Admitting: Cardiovascular Disease

## 2017-02-22 VITALS — BP 140/70 | HR 53 | Ht 67.0 in | Wt 204.8 lb

## 2017-02-22 DIAGNOSIS — I10 Essential (primary) hypertension: Secondary | ICD-10-CM | POA: Diagnosis not present

## 2017-02-22 DIAGNOSIS — I480 Paroxysmal atrial fibrillation: Secondary | ICD-10-CM | POA: Diagnosis not present

## 2017-02-22 DIAGNOSIS — N183 Chronic kidney disease, stage 3 unspecified: Secondary | ICD-10-CM

## 2017-02-22 DIAGNOSIS — E782 Mixed hyperlipidemia: Secondary | ICD-10-CM

## 2017-02-22 DIAGNOSIS — R0602 Shortness of breath: Secondary | ICD-10-CM

## 2017-02-22 DIAGNOSIS — I25118 Atherosclerotic heart disease of native coronary artery with other forms of angina pectoris: Secondary | ICD-10-CM | POA: Diagnosis not present

## 2017-02-22 DIAGNOSIS — F17200 Nicotine dependence, unspecified, uncomplicated: Secondary | ICD-10-CM | POA: Diagnosis not present

## 2017-02-22 MED ORDER — NITROGLYCERIN 0.4 MG SL SUBL: 0.4000 mg | SUBLINGUAL_TABLET | SUBLINGUAL | 3 refills | Status: DC | PRN

## 2017-02-22 MED ORDER — HYDRALAZINE HCL 50 MG PO TABS: 50.0000 mg | ORAL_TABLET | Freq: Three times a day (TID) | ORAL | 7 refills | Status: DC

## 2017-02-22 NOTE — Patient Instructions (Signed)
Medication Instructions:   Please take hydralazine 1/2 or whole three times a day for blood pressure  Labwork:  No new labs needed  Testing/Procedures:  No further testing at this time   Follow-Up: It was a pleasure seeing you in the office today. Please call us if you have new issues that need to be addressed before your next appt.  [phone]  Your physician wants you to follow-up in: 6 months.  You will receive a reminder letter in the mail two months in advance. If you don't receive a letter, please call our office to schedule the follow-up appointment.  If you need a refill on your cardiac medications before your next appointment, please call your pharmacy.

## 2017-02-23 ENCOUNTER — Telehealth: Payer: Self-pay | Admitting: Cardiovascular Disease

## 2017-02-23 ENCOUNTER — Other Ambulatory Visit: Payer: Self-pay

## 2017-02-23 MED ORDER — AMIODARONE HCL 200 MG PO TABS: ORAL_TABLET | ORAL | 3 refills | Status: DC

## 2017-02-23 NOTE — Telephone Encounter (Signed)
Requested Prescriptions   Signed Prescriptions Disp Refills  . amiodarone (PACERONE) 200 MG tablet 30 tablet 3    Sig: TAKE ONE (1) TABLET EACH DAY    Authorizing Provider: Minna Merritts    Ordering User: Janan Ridge

## 2017-02-23 NOTE — Telephone Encounter (Signed)
Spoke with pharmacy and resent for 90 days.

## 2017-02-23 NOTE — Telephone Encounter (Signed)
Pharmacy calling asking if we may send in a 90 day for patient Amiodarone   Please advise

## 2017-02-24 ENCOUNTER — Ambulatory Visit: Payer: Medicare Other | Admitting: Orthopaedic Surgery

## 2017-03-04 ENCOUNTER — Other Ambulatory Visit: Payer: Self-pay | Admitting: Family Medicine

## 2017-03-04 DIAGNOSIS — M542 Cervicalgia: Secondary | ICD-10-CM

## 2017-03-10 ENCOUNTER — Telehealth: Payer: Self-pay | Admitting: Cardiovascular Disease

## 2017-03-10 NOTE — Telephone Encounter (Signed)
Yes, acceptable risk to hold anticoagulation Would restart when complete

## 2017-03-10 NOTE — Telephone Encounter (Signed)
[facility] is requesting permission for her to stop Pradaxa for 2 days for upcoming cervical ESI. Please route clearance to Fax [phone] and if any questions Phone is [phone] Last office visit 02/22/17, stress test 10/18/12, Cath 2006.

## 2017-03-11 ENCOUNTER — Telehealth: Payer: Self-pay | Admitting: Cardiovascular Disease

## 2017-03-11 NOTE — Telephone Encounter (Signed)
Spoke with patient and she reports increased swelling to bilateral legs and feet. She is currently taking Furosemide 40 mg twice a day. Instructed her to take 1 & 1/2 tablet tonight and then twice tomorrow then go back to 40 mg twice a day. I could hear some audible wheezing over the phone and she reported that she would go to the ED over the weekend if this continued or became worse. She was appreciative for the call and had no further questions at this time. Will route to Dr. Rockey Situ as well for review.

## 2017-03-11 NOTE — Telephone Encounter (Signed)
Routed to number provided. 

## 2017-03-11 NOTE — Telephone Encounter (Signed)
Pt c/o swelling: STAT is pt has developed SOB within 24 hours  1. How long have you been experiencing swelling? 3 days  2. Where is the swelling located? bilatyeral legs and feet  3.  Are you currently taking a "fluid pill"? Yes, 2.  4.  Are you currently SOB? some  5.  Have you traveled recently? no

## 2017-03-12 NOTE — Telephone Encounter (Signed)
We need an echo to see what is happening EF,  Pressures,  Etc That will help determine if this is a lung issue or heart issues

## 2017-03-14 ENCOUNTER — Other Ambulatory Visit: Payer: Self-pay

## 2017-03-14 DIAGNOSIS — M7989 Other specified soft tissue disorders: Secondary | ICD-10-CM

## 2017-03-14 NOTE — Telephone Encounter (Signed)
S/w pt who reports LE swelling has not improved though she increased lasix as advised. Did not suggest she continue increased dose as pt has CKD stage III Echo scheduled 9/19, 11:30am.  Routed to MD to make aware

## 2017-03-15 ENCOUNTER — Other Ambulatory Visit: Payer: Self-pay | Admitting: Cardiovascular Disease

## 2017-03-15 DIAGNOSIS — I2510 Atherosclerotic heart disease of native coronary artery without angina pectoris: Secondary | ICD-10-CM

## 2017-03-15 DIAGNOSIS — I480 Paroxysmal atrial fibrillation: Secondary | ICD-10-CM

## 2017-03-15 DIAGNOSIS — R600 Localized edema: Secondary | ICD-10-CM

## 2017-03-23 ENCOUNTER — Ambulatory Visit (INDEPENDENT_AMBULATORY_CARE_PROVIDER_SITE_OTHER): Payer: Medicare Other

## 2017-03-23 ENCOUNTER — Other Ambulatory Visit: Payer: Self-pay

## 2017-03-23 DIAGNOSIS — R600 Localized edema: Secondary | ICD-10-CM | POA: Diagnosis not present

## 2017-03-23 DIAGNOSIS — I2510 Atherosclerotic heart disease of native coronary artery without angina pectoris: Secondary | ICD-10-CM | POA: Diagnosis not present

## 2017-03-23 DIAGNOSIS — I480 Paroxysmal atrial fibrillation: Secondary | ICD-10-CM | POA: Diagnosis not present

## 2017-03-24 ENCOUNTER — Telehealth: Payer: Self-pay | Admitting: *Deleted

## 2017-03-24 NOTE — Telephone Encounter (Signed)
-----   Message from Minna Merritts, MD sent at 03/24/2017  7:42 AM EDT ----- Echocardiogram result Normal ventricular function No significant valve disease There is elevated right heart pressures likely secondary to underlying lung disease  Can we obtained her latest lab work from Dr. Jimmy Footman, nephrology  made to find out how much we can push the Lasix to help her leg swelling and heart pressures

## 2017-03-24 NOTE — Telephone Encounter (Signed)
No answer at Dr [name]'s. Left message with person in medical records to fax [place] patient's most recent lab work.

## 2017-04-01 NOTE — Telephone Encounter (Signed)
Spoke with [employer] over at Dr. [name]'s office and requested most recent labs. She is faxing them over at this time.

## 2017-04-05 NOTE — Telephone Encounter (Signed)
Lab results received and placed up front to be scanned into chart.

## 2017-04-11 ENCOUNTER — Inpatient Hospital Stay
Admission: RE | Admit: 2017-04-11 | Discharge: 2017-04-11 | Disposition: A | Discharge status: Home / Self Care | Payer: Medicare Other | Source: Ambulatory Visit | Attending: Family Medicine | Admitting: Family Medicine

## 2017-04-27 ENCOUNTER — Ambulatory Visit
Admission: RE | Admit: 2017-04-27 | Discharge: 2017-04-27 | Disposition: A | Discharge status: Home / Self Care | Payer: Medicare Other | Source: Ambulatory Visit | Attending: Family Medicine | Admitting: Family Medicine

## 2017-04-27 DIAGNOSIS — M542 Cervicalgia: Secondary | ICD-10-CM

## 2017-04-27 MED ORDER — TRIAMCINOLONE ACETONIDE 40 MG/ML IJ SUSP (RADIOLOGY)
60.0000 mg | Freq: Once | INTRAMUSCULAR | Status: AC
  Administered 2017-04-27: 60 mg via EPIDURAL

## 2017-04-27 MED ORDER — IOPAMIDOL (ISOVUE-M 300) INJECTION 61%
1.0000 mL | Freq: Once | INTRAMUSCULAR | Status: AC | PRN
  Administered 2017-04-27: 1 mL via EPIDURAL

## 2017-04-27 NOTE — Discharge Instructions (Signed)
Post Procedure Spinal Discharge Instruction Sheet  1. You may resume a regular diet and any medications that you routinely take (including pain medications).  2. No driving day of procedure.  3. Light activity throughout the rest of the day.  Do not do any strenuous work, exercise, bending or lifting.  The day following the procedure, you can resume normal physical activity but you should refrain from exercising or physical therapy for at least three days thereafter.   Common Side Effects:   Headaches- take your usual medications as directed by your physician.  Increase your fluid intake.  Caffeinated beverages may be helpful.  Lie flat in bed until your headache resolves.   Restlessness or inability to sleep- you may have trouble sleeping for the next few days.  Ask your referring physician if you need any medication for sleep.   Facial flushing or redness- should subside within a few days.   Increased pain- a temporary increase in pain a day or two following your procedure is not unusual.  Take your pain medication as prescribed by your referring physician.   Leg cramps  Please contact our office at [phone] for the following symptoms:  Fever greater than 100 degrees.  Headaches unresolved with medication after 2-3 days.  Increased swelling, pain, or redness at injection site.  Thank you for visiting our office.   MAY RESUME PRADAXA TODAY.

## 2017-05-03 ENCOUNTER — Telehealth: Payer: Self-pay | Admitting: Cardiovascular Disease

## 2017-05-03 NOTE — Telephone Encounter (Signed)
Spoke w/ pt.  She reports that her BP has been going up since her kidney doctor decreased her lasix to 40 mg once daily instead of BID.  She has been taking hydralazine 50 mg BID, but increased to TID yesterday w/ little change in her BP. She does not like this med, as she feels it makes her sick and leaves a bitter taste in her mouth.  She reports nausea and would like refills sent in on her phenergan, as Dr. Rockey Situ filled this for her previously and it helped her a great deal. She does not weigh herself daily, but reports that her she weighs the most she ever has. She denies swelling, reports chronic SOB due to her COPD.  She reports that her lasix was decreased 2/2 to her kidney function being "down to 25%" . Notes from [facility] show Serum Creatinine of 1.99, Gfr 24, BUN 11. Will route to Dr. Rockey Situ for his recommendation.

## 2017-05-03 NOTE — Telephone Encounter (Signed)
Pt calling having issues with BP   Pt c/o BP issue: STAT if pt c/o blurred vision, one-sided weakness or slurred speech  1. What are your last 5 BP readings?  05/03/17 morning 158/113 evening 153/100 05/02/17 morning 190/45 evening 133/63 05/01/17 morning 164/75 evening  173/78   2. Are you having any other symptoms (ex. Dizziness, headache, blurred vision, passed out)?  No   3. What is your BP issue? BP is steadily going up

## 2017-05-03 NOTE — Telephone Encounter (Signed)
She could consider taking extra isosorbide 60 mg morning Additional 30 mg up to 60 mg in the evening  Other medication includes Cardura/doxazosin

## 2017-05-04 MED ORDER — PROMETHAZINE HCL 25 MG PO TABS: 25.0000 mg | ORAL_TABLET | Freq: Four times a day (QID) | ORAL | 0 refills | Status: DC | PRN

## 2017-05-04 MED ORDER — ISOSORBIDE MONONITRATE ER 60 MG PO TB24: 60.0000 mg | ORAL_TABLET | Freq: Two times a day (BID) | ORAL | 3 refills | Status: DC

## 2017-05-04 NOTE — Telephone Encounter (Signed)
Spoke with patient and reviewed Dr. Donivan Scull recommendations. Instructed her to increase isosorbide mononitrate to twice daily and to continue monitoring her symptoms. She verbalized understanding of instructions, agreeable with plan, and had no further questions at this time. Confirmed pharmacy and scripts sent in. Patient wanted also nausea medication and let her know that I would send in one script but she would need to check with her PCP for any further refills of that medication.

## 2017-05-12 ENCOUNTER — Other Ambulatory Visit: Payer: Self-pay | Admitting: Cardiovascular Disease

## 2017-05-12 DIAGNOSIS — I739 Peripheral vascular disease, unspecified: Secondary | ICD-10-CM

## 2017-05-25 ENCOUNTER — Ambulatory Visit: Payer: Medicare Other | Admitting: Cardiovascular Disease

## 2017-06-01 ENCOUNTER — Telehealth: Payer: Self-pay | Admitting: Cardiovascular Disease

## 2017-06-01 NOTE — Telephone Encounter (Signed)
She could consider stopping hydralazine to see if leg pain gets better Monitor blood pressure and call us with numbers

## 2017-06-01 NOTE — Telephone Encounter (Signed)
Spoke with patient and reviewed Dr. Donivan Scull recommendations. Instructed her to monitor blood pressures and to please call us with those readings. Discussed that if there are no changes in the leg pain then it is most likely not the medication. She verbalized understanding of our conversation, agreement with plan, and had no further questions at this time.

## 2017-06-01 NOTE — Telephone Encounter (Signed)
Spoke with patient and she states that since being on hydralazine she has been having terrible pain to her legs. She reports that the pain has been so bad that she is not able to hardly walk. She states this started after starting the hydralazine three times a day and thinks that is the cause. She confirmed that she is also on furosemide as well. Reviewed that I would route this over to Dr. Rockey Situ for review and will give her a call back with recommendations. She verbalized understanding of our conversation and had no further questions at this time.

## 2017-06-01 NOTE — Telephone Encounter (Signed)
Pt c/o medication issue:  1. Name of Medication: Hydralazine   2. How are you currently taking this medication (dosage and times per day)?  3 times a day 50 mg   3. Are you having a reaction (difficulty breathing--STAT)? no  4. What is your medication issue? Having pains in her legs. She states its only been like this since she started the medication

## 2017-07-03 NOTE — Progress Notes (Deleted)
Cardiology Office Note  Date:  07/03/2017   ID:  [name], DOB 1940-07-08, MRN [id number]  PCP:  Kim Evens, MD   No chief complaint on file.   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  On her last clinic visit she had significant leg swelling Previously treated with antibiotics for possible cellulitis We held her amlodipine In follow-up today, leg swelling has improved, she is using Ace wraps  She reports her GFR has dropped. Lab work not available, not in the [facility]  Lower extremity arterial Doppler reviewed with her 50 to 75% Mid right SFA She reports that her legs hurt all the time, unclear she is having claudication symptoms  Breathing somewhat worse, she feels it is just chronic bronchitis In the past has required prednisone Unable to tolerate antibiotics except for Z-Pak  EKG personally reviewed by myself on todays visit Shows normal sinus rhythm with rate 53 bpm, nonspecific ST abnormality, rare PVCs  Other past medical history reviewed Peak creatinine 2.5,   Continues to smoke, started smoking at age 17  reports that she is DNR/DNI, she has told this to family, has papers in place She reports having left lower extremity cramping pain, seems to calm on at rest, sometimes with exertion  Total cholesterol 204, LDL 131. She does not want a statin  Thyroid medication recently increased. Initial TSH 18.8, down to 12 in July 2016. Managed by primary care We did talk  about side effects of amiodarone and she does not want to change the medication She does not want a statin  Status post cardioversion after she failed to convert to normal sinus rhythm on amiodarone. Cardioversion 05/25/2013 which was successful in restoring normal sinus rhythm. She had converted from atrial fibrillation to atrial flutter at the time of the cardioversion. Lab from May 2016 showing TSH 18.8, total cholesterol 170, LDL 97  She takes Lasix twice per week, typically as needed for ankle edema. She is napping in the daytime, no regular activity. She is tolerating anticoagulation, no bleeding chronic neck pain, shoulder pain. She typically takes meloxicam when necessary She reports having a ruptured disc in her neck.  PMH:   has a past medical history of CAD (coronary artery disease), Carotid artery disease ([name]), Cervical disc disease, Chronic renal impairment, stage 3 (moderate) (07/19/2016), COPD (chronic obstructive pulmonary disease) ([name]), Dysphagia, Dysrhythmia, GERD (gastroesophageal reflux disease), History of kidney stones, Hypertension, Hypertriglyceridemia, Hypothyroidism, MI (myocardial infarction) ([name]), and PONV (postoperative nausea and vomiting).  PSH:    Past Surgical History:  Procedure Laterality Date  . ABDOMINAL HYSTERECTOMY    . AMPUTATION Right 04/12/2014   Procedure: Revision AMPUTATION Right Long DIGIT;  Surgeon: Kim Cover, MD;  Location: [facility];  Service: Orthopedics;  Laterality: Right;  . BALLOON DILATION  05/05/2011   Procedure: BALLOON DILATION;  Surgeon: Kim Houston, MD;  [facility]: [facility];  Service: Endoscopy;  Laterality: N/A;  . CARDIAC CATHETERIZATION     with stent 2004  CYPHER 2.75 x 70mm coronary stent in the mid left anterior descending stenotic lesion    Last stress test showed EF of 775  . CARDIOVERSION    . CATARACT EXTRACTION Left 2002  . CATARACT EXTRACTION W/PHACO Right 05/14/2013   Procedure: CATARACT EXTRACTION PHACO AND  INTRAOCULAR LENS PLACEMENT RIGHT EYE;  Surgeon: Kim Branch, MD;  Location: [facility];  Service: Ophthalmology;  Laterality: Right;  CDE:  20.45  . CERVICAL DISC SURGERY    . CHOLECYSTECTOMY    . CORONARY ANGIOPLASTY     1 stent  . ESOPHAGEAL DILATION N/A 04/25/2015   Procedure: ESOPHAGEAL DILATION;  Surgeon: Kim Houston, MD;  [facility];  Service: Endoscopy;  Laterality: N/A;  . ESOPHAGOGASTRODUODENOSCOPY N/A 04/25/2015   Procedure: ESOPHAGOGASTRODUODENOSCOPY (EGD);  Surgeon: Kim Houston, MD;  [facility];  Service: Endoscopy;  Laterality: N/A;  1225  . MALONEY DILATION  05/05/2011   Procedure: MALONEY DILATION;  Surgeon: Kim Houston, MD;  [facility];  Service: Endoscopy;  Laterality: N/A;  . right knee arthroscopy  2010  . SAVORY DILATION  05/05/2011   Procedure: SAVORY DILATION;  Surgeon: Kim Houston, MD;  [facility];  Service: Endoscopy;  Laterality: N/A;  . SHOULDER SURGERY Right    torn rotator cuff  . TOENAIL EXCISION    . TOTAL ABDOMINAL HYSTERECTOMY W/ BILATERAL SALPINGOOPHORECTOMY      Current Outpatient Medications  Medication Sig Dispense Refill  . Acetaminophen (TYLENOL PO) Take 500-1,000 mg by mouth every 6 (six) hours as needed (pain).     . ADVAIR DISKUS 500-50 MCG/DOSE AEPB 1 puff 2 (two) times daily.     Marland Kitchen albuterol (PROVENTIL) (2.5 MG/3ML) 0.083% nebulizer solution Take 2.5 mg by nebulization every 6 (six) hours as needed. For shortness of breath    . amiodarone (PACERONE) 200 MG tablet TAKE ONE (1) TABLET EACH DAY 90 tablet 3  . Ascorbic Acid (VITAMIN C) 1000 MG tablet Take 1,000 mg by mouth daily.    . calcitRIOL (ROCALTROL) 0.5 MCG capsule Take 0.5 mcg by mouth daily.    . Cetirizine HCl 10 MG CAPS Take 10 mg by mouth daily.    . Cholecalciferol (VITAMIN D3) 5000 units CAPS Take by mouth daily.    . clindamycin (CLEOCIN) 300 MG capsule Take 300 mg by mouth QID.    Marland Kitchen clonazePAM (KLONOPIN) 1 MG tablet  Take 1 mg by mouth as needed for anxiety.     . colchicine 0.6 MG tablet Take one tablet by mouth three times daily at the first sign of a gout attack.  Decrease to one tablet daily if gastric upset occurs. (Patient not taking: Reported on 02/22/2017) 60 tablet 1  . dabigatran (PRADAXA) 150 MG CAPS capsule Take 1 capsule (150 mg total) by mouth 2 (two) times daily. 180 capsule 3  . furosemide (LASIX) 40 MG tablet Take 40 mg by mouth 2 (two) times daily.    . hydrALAZINE (APRESOLINE) 50 MG tablet Take 1 tablet (50 mg total) by mouth 3 (three) times daily. 90 tablet 7  . isosorbide mononitrate (IMDUR) 60 MG 24 hr tablet Take 1 tablet (60 mg total) by mouth 2 (two) times daily. 60 tablet 3  . levothyroxine (SYNTHROID, LEVOTHROID) 125 MCG tablet Take 125 mcg by mouth daily.    . metoprolol tartrate (LOPRESSOR) 25 MG tablet Take 25 mg by mouth 2 (two) times daily.    . nitroGLYCERIN (NITROLINGUAL) 0.4 MG/SPRAY spray PLACE  ONE SPRAY UNDER THE TONGUE EVERY 5MINUTES FOR 3 DOSES AS NEEDEDFOR CHEST PAIN. 12 g 1  . nitroGLYCERIN (NITROSTAT) 0.4 MG SL tablet Place 1 tablet (0.4 mg total) under the tongue every 5 (five) minutes as needed for chest pain. 25 tablet 3  . olopatadine (PATANOL) 0.1 % ophthalmic solution Place 1 drop into both eyes daily.    Marland Kitchen omega-3 acid ethyl esters (LOVAZA) 1 G capsule Take 2 g by mouth 2 (two) times daily.      Marland Kitchen PROAIR HFA 108 (90 BASE) MCG/ACT inhaler every 4 (four) hours as needed.    . promethazine (PHENERGAN) 25 MG tablet Take 1 tablet (25 mg total) by mouth every 6 (six) hours as needed for nausea or vomiting. 30 tablet 0  . ranitidine (ZANTAC) 150 MG capsule Take 150 mg by mouth daily as needed for heartburn.    . vitamin B-12 (CYANOCOBALAMIN) 1000 MCG tablet Take 1,000 mcg by mouth 2 (two) times daily.     No current facility-administered medications for this visit.      Allergies:   Penicillins; Ceclor [cefaclor]; Codeine; Lipitor [atorvastatin]; and Sulfa antibiotics    Social History:  The patient  reports that she has been smoking cigarettes.  She has a 56.00 pack-year smoking history. she has never used smokeless tobacco. She reports that she does not drink alcohol or use drugs.   Family History:   family history includes Diabetes in her mother; Heart attack in her father; Heart disease in her brother and sister; Heart failure in her mother; Hypertension in her sister.    Review of Systems: Review of Systems  Constitutional: Negative.   Respiratory: Positive for cough and shortness of breath.   Cardiovascular: Positive for leg swelling.  Gastrointestinal: Negative.   Musculoskeletal: Negative.   Neurological: Negative.   Psychiatric/Behavioral: Negative.   All other systems reviewed and are negative.    PHYSICAL EXAM: VS:  There were no vitals taken for this visit. , BMI There is no height or weight on file to calculate BMI. GEN: Well nourished, well developed, in no acute distress  HEENT: normal  Neck: no JVD, carotid bruits, or masses Cardiac: RRR; no murmurs, rubs, or gallops, trace lower extremity nonpitting edema, Ace wraps in place Respiratory:  Moderately decreased breath sounds throughout, normal work of breathing GI: soft, nontender, nondistended, + BS MS: no deformity or atrophy  Skin: warm and dry, no rash Neuro:  Strength and sensation are intact Psych: euthymic mood, full affect    Recent Labs: No results found for requested labs within last 8760 hours.    Lipid Panel No results found for: CHOL, HDL, LDLCALC, TRIG    Wt Readings from Last 3 Encounters:  02/22/17 204 lb 12 oz (92.9 kg)  01/25/17 201 lb (91.2 kg)  01/18/17 201 lb 12 oz (91.5 kg)       ASSESSMENT AND PLAN:  Paroxysmal atrial fibrillation (HCC) - Plan: EKG 12-Lead Maintaining normal sinus rhythm,on anticoagulation  Essential hypertension - Plan: EKG 12-Lead Blood pressure Borderline elevated, No changes made to the medications.  Coronary  artery disease involving native coronary artery of native heart without angina pectoris - Plan: EKG 12-Lead Currently with no symptoms of angina. No further workup at this time. Continue current medication regimen.  Mixed hyperlipidemia She does not want a statin,  Encouraged her to stay on Zetia  PAD Lower extremity arterial stenosis, 50-75% stenosis mid right SFA Given chronic renal insufficiency, not a good candidate at this time  for intervention We'll closely monitor renal function Repeat lower extremity arterial Doppler 6 months  SOB (shortness of breath) Long history of smoking who continues to smoke Underlying COPD Suggested she talk with primary care about a course of prednisone if her symptoms of wheezing, respiratory status worsen  She has chronic bronchitis  Leg swelling dependent edema, recommended leg elevation, compression hose, Ace wraps,  Amlodipine held on her last clinic visit with moderate improvement  Smoker We have encouraged her to continue to work on weaning her cigarettes and smoking cessation. She will continue to work on this and does not want any assistance with chantix.   Chronic renal impairment, stage 3 (moderate) recommended she proceed with Lasix very cautiously Followed by nephrology Most recent lab work not available but she reports dramatic decline in GFR possibly from overdiuresis   Total encounter time more than 25 minutes  Greater than 50% was spent in counseling and coordination of care with the patient  Disposition:   F/U  6 months   No orders of the defined types were placed in this encounter.    Signed, Esmond Plants, M.D., Ph.D. 07/03/2017  [place], [place]

## 2017-07-04 ENCOUNTER — Ambulatory Visit: Payer: Medicare Other | Admitting: Cardiovascular Disease

## 2017-07-13 ENCOUNTER — Other Ambulatory Visit: Payer: Self-pay | Admitting: Family Medicine

## 2017-07-13 DIAGNOSIS — M542 Cervicalgia: Secondary | ICD-10-CM

## 2017-07-26 DIAGNOSIS — J449 Chronic obstructive pulmonary disease, unspecified: Secondary | ICD-10-CM | POA: Insufficient documentation

## 2017-07-26 NOTE — Progress Notes (Signed)
Cardiology Office Note  Date:  07/27/2017   ID:  [name], DOB 25-Jan-1941, MRN [id number]  PCP:  Lemmie Evens, MD   Chief Complaint  Patient presents with  . OTHER    Leg pain. Meds reviewed verbally with pt.    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  On prior office visit she had significant leg swelling Amlodipine held, symptoms improved  Now with chronic leg pain, chronic low back pain, cervical pain Reports that she has slipped disc, previously managed by neurosurgery Reports having previous horizontal shots with brief hiatus from her pain Main complaint today is diffuse discomfort going down the backs of her legs, describes it as a tightness, unable to walk very far Symptoms presenting while resting in bed as well as with exertion   Previous workup for lower extremity vascular disease   showing moderate nonobstructive disease, medical management recommended    breathing improved from previous office visit,   bronchitis has resolved Previously required prednisone and Z-Pak  EKG personally reviewed by myself on todays visit Shows normal sinus rhythm with rate 53 bpm, nonspecific ST abnormality, rare PVCs  Other past medical history reviewed Peak creatinine 2.5,   Continues to smoke, started smoking at age 45  Total cholesterol 204, LDL 131. She does not want a statin  Thyroid medication recently  increased. Initial TSH 18.8, down to 12 in July 2016. Managed by primary care We did talk about side effects of amiodarone and she does not want to change the medication She does not want a statin  Status post cardioversion after she failed to convert to normal sinus rhythm on amiodarone. Cardioversion 05/25/2013 which was successful in restoring normal sinus rhythm. She had converted from atrial fibrillation to atrial flutter at the time of the cardioversion. Lab from May 2016 showing TSH 18.8, total cholesterol 170, LDL 97  She takes Lasix twice per week, typically as needed for ankle edema. She is napping in the daytime, no regular activity. She is tolerating anticoagulation, no bleeding chronic neck pain, shoulder pain. She typically takes meloxicam when necessary She reports having a ruptured disc in her neck.  PMH:   has a past medical history of CAD (coronary artery disease), Carotid artery disease ([place]), Cervical disc disease, Chronic renal impairment, stage 3 (moderate) ([place]) (07/19/2016), COPD (chronic obstructive pulmonary disease) ([place]), Dysphagia, Dysrhythmia, GERD (gastroesophageal reflux disease), History of kidney stones, Hypertension, Hypertriglyceridemia, Hypothyroidism, MI (myocardial infarction) ([place]), and PONV (postoperative nausea and vomiting).  PSH:    Past Surgical History:  Procedure Laterality Date  . ABDOMINAL HYSTERECTOMY    . AMPUTATION Right 04/12/2014   Procedure: Revision AMPUTATION Right Long DIGIT;  Surgeon: Leanora Cover, MD;  Location: [facility];  Service: Orthopedics;  Laterality: Right;  . BALLOON DILATION  05/05/2011   Procedure: BALLOON DILATION;  Surgeon: Rogene Houston, MD;  [facility];  Service: Endoscopy;  Laterality: N/A;  .  CARDIAC CATHETERIZATION     with stent 2004  CYPHER 2.75 x 110mm coronary stent in the mid left anterior descending stenotic lesion    Last stress test showed EF of 775  . CARDIOVERSION    . CATARACT EXTRACTION Left  2002  . CATARACT EXTRACTION W/PHACO Right 05/14/2013   Procedure: CATARACT EXTRACTION PHACO AND INTRAOCULAR LENS PLACEMENT RIGHT EYE;  Surgeon: Tonny Branch, MD;  Location: [facility];  Service: Ophthalmology;  Laterality: Right;  CDE:  20.45  . CERVICAL DISC SURGERY    . CHOLECYSTECTOMY    . CORONARY ANGIOPLASTY     1 stent  . ESOPHAGEAL DILATION N/A 04/25/2015   Procedure: ESOPHAGEAL DILATION;  Surgeon: Rogene Houston, MD;  [facility];  Service: Endoscopy;  Laterality: N/A;  . ESOPHAGOGASTRODUODENOSCOPY N/A 04/25/2015   Procedure: ESOPHAGOGASTRODUODENOSCOPY (EGD);  Surgeon: Rogene Houston, MD;  [facility];  Service: Endoscopy;  Laterality: N/A;  1225  . MALONEY DILATION  05/05/2011   Procedure: MALONEY DILATION;  Surgeon: Rogene Houston, MD;  [facility];  Service: Endoscopy;  Laterality: N/A;  . right knee arthroscopy  2010  . SAVORY DILATION  05/05/2011   Procedure: SAVORY DILATION;  Surgeon: Rogene Houston, MD;  [facility];  Service: Endoscopy;  Laterality: N/A;  . SHOULDER SURGERY Right    torn rotator cuff  . TOENAIL EXCISION    . TOTAL ABDOMINAL HYSTERECTOMY W/ BILATERAL SALPINGOOPHORECTOMY      Current Outpatient Medications  Medication Sig Dispense Refill  . Acetaminophen (TYLENOL PO) Take 500-1,000 mg by mouth every 6 (six) hours as needed (pain).     . ADVAIR DISKUS 500-50 MCG/DOSE AEPB 1 puff 2 (two) times daily.     Marland Kitchen albuterol (PROVENTIL) (2.5 MG/3ML) 0.083% nebulizer solution Take 2.5 mg by nebulization every 6 (six) hours as needed. For shortness of breath    . amiodarone (PACERONE) 200 MG tablet TAKE ONE (1) TABLET EACH DAY 90 tablet 3  . Ascorbic Acid (VITAMIN C) 1000 MG tablet Take 1,000 mg by mouth daily.    . calcitRIOL (ROCALTROL) 0.5 MCG capsule Take 0.5 mcg by mouth daily.    . Cetirizine HCl 10 MG CAPS Take 10 mg by mouth daily.    . Cholecalciferol (VITAMIN D3) 5000 units CAPS Take by mouth daily.    .  clonazePAM (KLONOPIN) 1 MG tablet Take 1 mg by mouth as needed for anxiety.     . dabigatran (PRADAXA) 150 MG CAPS capsule Take 1 capsule (150 mg total) by mouth 2 (two) times daily. 180 capsule 3  . furosemide (LASIX) 40 MG tablet Take 40 mg by mouth 2 (two) times daily.    . hydrALAZINE (APRESOLINE) 50 MG tablet Take 1 tablet (50 mg total) by mouth 3 (three) times daily. 90 tablet 7  . isosorbide mononitrate (IMDUR) 60 MG 24 hr tablet Take 1 tablet (60 mg total) by mouth 2 (two) times daily. 60 tablet 3  . levothyroxine (SYNTHROID, LEVOTHROID) 125 MCG tablet Take 125 mcg by mouth daily.    . metoprolol tartrate (LOPRESSOR) 25 MG tablet Take 25 mg by mouth 2 (two) times daily.    . nitroGLYCERIN (NITROLINGUAL) 0.4 MG/SPRAY spray PLACE ONE SPRAY UNDER THE TONGUE EVERY 5MINUTES FOR 3 DOSES AS NEEDEDFOR CHEST PAIN. 12 g 1  . olopatadine (PATANOL) 0.1 % ophthalmic solution Place 1 drop into both eyes daily.    Marland Kitchen omega-3 acid ethyl esters (LOVAZA) 1 G capsule Take 2  g by mouth 2 (two) times daily.      Marland Kitchen PROAIR HFA 108 (90 BASE) MCG/ACT inhaler every 4 (four) hours as needed.    . promethazine (PHENERGAN) 25 MG tablet Take 1 tablet (25 mg total) by mouth every 6 (six) hours as needed for nausea or vomiting. 30 tablet 0  . ranitidine (ZANTAC) 150 MG capsule Take 150 mg by mouth daily as needed for heartburn.    . vitamin B-12 (CYANOCOBALAMIN) 1000 MCG tablet Take 1,000 mcg by mouth 2 (two) times daily.    . colchicine 0.6 MG tablet Take one tablet by mouth three times daily at the first sign of a gout attack.  Decrease to one tablet daily if gastric upset occurs. (Patient not taking: Reported on 02/22/2017) 60 tablet 1   No current facility-administered medications for this visit.      Allergies:   Penicillins; Ceclor [cefaclor]; Codeine; Lipitor [atorvastatin]; and Sulfa antibiotics   Social History:  The patient  reports that she has been smoking cigarettes.  She has a 56.00 pack-year smoking  history. she has never used smokeless tobacco. She reports that she does not drink alcohol or use drugs.   Family History:   family history includes Diabetes in her mother; Heart attack in her father; Heart disease in her brother and sister; Heart failure in her mother; Hypertension in her sister.    Review of Systems: Review of Systems  Constitutional: Negative.   Respiratory: Positive for shortness of breath.   Cardiovascular: Negative.   Gastrointestinal: Negative.   Musculoskeletal: Negative.        Leg pain  Neurological: Negative.   Psychiatric/Behavioral: Negative.   All other systems reviewed and are negative.    PHYSICAL EXAM: VS:  BP (!) 156/72 (BP Location: Left Arm, Patient Position: Sitting, Cuff Size: Normal)   Pulse (!) 59   Ht 5' 6.5" (1.689 m)   Wt 196 lb 8 oz (89.1 kg)   BMI 31.24 kg/m  , BMI Body mass index is 31.24 kg/m. GEN: Well nourished, well developed, in no acute distress  HEENT: normal  Neck: no JVD, carotid bruits, or masses Cardiac: RRR; no murmurs, rubs, or gallops, no significant lower extremity edema  Respiratory:  Moderately decreased breath sounds throughout, normal work of breathing GI: soft, nontender, nondistended, + BS MS: no deformity or atrophy  Skin: warm and dry, no rash Neuro:  Strength and sensation are intact PsychSomewhat depressed , full affect    Recent Labs: No results found for requested labs within last 8760 hours.    Lipid Panel No results found for: CHOL, HDL, LDLCALC, TRIG    Wt Readings from Last 3 Encounters:  07/27/17 196 lb 8 oz (89.1 kg)  02/22/17 204 lb 12 oz (92.9 kg)  01/25/17 201 lb (91.2 kg)       ASSESSMENT AND PLAN:  Paroxysmal atrial fibrillation (HCC) - Plan: EKG 12-Lead Maintaining normal sinus rhythm,on anticoagulation  stable  Essential hypertension - Plan: EKG 12-Lead Reports having nausea on the hydralazine but does not want to change the medication   recommended she call us if she  would like to change. Several intolerances, we will consider changing to Cardura/doxazosin  Coronary artery disease involving native coronary artery of native heart without angina pectoris - Plan: EKG 12-Lead Currently with no symptoms of angina. No further workup at this time. Continue current medication regimen.Stable, smoking cessation recommended   Mixed hyperlipidemia She does not want a statin,  Encouraged her to stay  on Zetia  smoking cessation  PAD Lower extremity arterial stenosis, 50-75% stenosis mid right SFA Likely not contributing to pain in her legs at rest and with minimal exertion   SOB (shortness of breath) She has chronic bronchitis Continues to smoke, symptoms are stable  Leg pain /back pain Previously seen by neurosurgery, has not been evaluated recently Reports having slipped disk, could not afford the surgery in the past Will refer to neurosurgery as symptoms seem to be getting worse , relatively debilitated even with getting up from the chair to the exam table .we did offer order for home physical therapy  .  she will think about it   Smoker We have encouraged her to continue to work on weaning her cigarettes and smoking cessation. She will continue to work on this and does not want any assistance with chantix.   Chronic renal impairment, stage 3 (moderate) Followed by nephrology   Total encounter time more than 25 minutes  Greater than 50% was spent in counseling and coordination of care with the patient  Disposition:   F/U  6 months   Orders Placed This Encounter  Procedures  . EKG 12-Lead     Signed, Esmond Plants, M.D., Ph.D. 07/27/2017  [place], [place]

## 2017-07-27 ENCOUNTER — Ambulatory Visit: Payer: Medicare Other | Admitting: Cardiovascular Disease

## 2017-07-27 ENCOUNTER — Encounter: Payer: Self-pay | Admitting: Cardiovascular Disease

## 2017-07-27 VITALS — BP 156/72 | HR 59 | Ht 66.5 in | Wt 196.5 lb

## 2017-07-27 DIAGNOSIS — I10 Essential (primary) hypertension: Secondary | ICD-10-CM

## 2017-07-27 DIAGNOSIS — R0602 Shortness of breath: Secondary | ICD-10-CM

## 2017-07-27 DIAGNOSIS — J432 Centrilobular emphysema: Secondary | ICD-10-CM

## 2017-07-27 DIAGNOSIS — E782 Mixed hyperlipidemia: Secondary | ICD-10-CM | POA: Diagnosis not present

## 2017-07-27 DIAGNOSIS — I25118 Atherosclerotic heart disease of native coronary artery with other forms of angina pectoris: Secondary | ICD-10-CM | POA: Diagnosis not present

## 2017-07-27 DIAGNOSIS — N183 Chronic kidney disease, stage 3 unspecified: Secondary | ICD-10-CM

## 2017-07-27 DIAGNOSIS — F17200 Nicotine dependence, unspecified, uncomplicated: Secondary | ICD-10-CM

## 2017-07-27 DIAGNOSIS — I480 Paroxysmal atrial fibrillation: Secondary | ICD-10-CM | POA: Diagnosis not present

## 2017-07-27 DIAGNOSIS — N2889 Other specified disorders of kidney and ureter: Secondary | ICD-10-CM

## 2017-07-27 MED ORDER — EZETIMIBE 10 MG PO TABS: 10.0000 mg | ORAL_TABLET | Freq: Every day | ORAL | 3 refills | Status: DC

## 2017-07-27 MED ORDER — NITROGLYCERIN 0.4 MG/SPRAY TL SOLN: 1.0000 | 3 refills | Status: DC | PRN

## 2017-07-27 NOTE — Patient Instructions (Addendum)
Call if you would like PT for legs   Medication Instructions:   No medication changes made  Labwork:  No new labs needed  Testing/Procedures:  No further testing at this time   Follow-Up: It was a pleasure seeing you in the office today. Please call us if you have new issues that need to be addressed before your next appt.  [phone]  Your physician wants you to follow-up in: 12 months.  You will receive a reminder letter in the mail two months in advance. If you don't receive a letter, please call our office to schedule the follow-up appointment.  If you need a refill on your cardiac medications before your next appointment, please call your pharmacy.

## 2017-07-28 ENCOUNTER — Telehealth: Payer: Self-pay | Admitting: Cardiovascular Disease

## 2017-07-28 NOTE — Telephone Encounter (Signed)
Left voicemail message with [place] regarding referral for her back/neck.

## 2017-07-28 NOTE — Telephone Encounter (Signed)
Spoke with Dr. Arnoldo Morale office and they will contact patient to arrange appointment. She reports that previous physician she was seeing in their office retired so they will reach out to her to establish further appointment.

## 2017-07-29 ENCOUNTER — Telehealth: Payer: Self-pay | Admitting: Cardiovascular Disease

## 2017-07-29 ENCOUNTER — Other Ambulatory Visit: Payer: Self-pay

## 2017-07-29 MED ORDER — PROMETHAZINE HCL 25 MG PO TABS: 25.0000 mg | ORAL_TABLET | Freq: Four times a day (QID) | ORAL | 3 refills | Status: DC | PRN

## 2017-07-29 NOTE — Telephone Encounter (Signed)
Refill sent to [facility]

## 2017-07-29 NOTE — Telephone Encounter (Signed)
I see Dr Rockey Situ reordered in the past; However, typically this medication would be handled by PCP. Please send request to send to PCP.

## 2017-07-29 NOTE — Telephone Encounter (Signed)
Please advise if this medication should be refilled by Dr. Rockey Situ.

## 2017-07-29 NOTE — Telephone Encounter (Signed)
°*  STAT* If patient is at the pharmacy, call can be transferred to refill team.   1. Which medications need to be refilled? (please list name of each medication and dose if known) Promethazine 25 mg po q 6 prn for nausea and vomiting   2. Which pharmacy/location (including street and city if local pharmacy) is medication to be sent to? [facility]   3. Do they need a 30 day or 90 day supply? [place]

## 2017-07-29 NOTE — Telephone Encounter (Signed)
Spoke with patient. Patient stated Dr. Rockey Situ originally prescribed Promethazine 25 mg po q 6 prn for nausea and vomiting for her and her PCP will not refill it for. Will route to Dr. Rockey Situ.

## 2017-07-29 NOTE — Telephone Encounter (Signed)
Ok to refill Reports hydralazine gives her nausea, but does not want to change medication (offered on her last visit)

## 2017-08-02 ENCOUNTER — Inpatient Hospital Stay
Admission: RE | Admit: 2017-08-02 | Discharge: 2017-08-02 | Disposition: A | Discharge status: Home / Self Care | Payer: Medicare Other | Source: Ambulatory Visit | Attending: Family Medicine | Admitting: Family Medicine

## 2017-08-05 ENCOUNTER — Encounter: Payer: Self-pay | Admitting: Cardiovascular Disease

## 2017-08-08 ENCOUNTER — Encounter: Payer: Self-pay | Admitting: Podiatry

## 2017-08-08 ENCOUNTER — Ambulatory Visit: Payer: Medicare Other | Admitting: Podiatry

## 2017-08-08 DIAGNOSIS — M779 Enthesopathy, unspecified: Principal | ICD-10-CM

## 2017-08-08 DIAGNOSIS — M109 Gout, unspecified: Secondary | ICD-10-CM

## 2017-08-08 DIAGNOSIS — M7750 Other enthesopathy of unspecified foot: Secondary | ICD-10-CM | POA: Diagnosis not present

## 2017-08-08 DIAGNOSIS — M778 Other enthesopathies, not elsewhere classified: Secondary | ICD-10-CM

## 2017-08-08 NOTE — Progress Notes (Signed)
She presents today for follow-up of capsulitis to the dorsal aspect of the bilateral foot and the first metatarsophalangeal joint of the right foot.  He states that they have been hurting her for the past several months but she did not come in.  She denies any new changes in her past medical history medications allergy surgeries or social history.  Objective: Vital signs are stable alert and oriented x3.  Pulses are palpable.  She is in no apparent distress but walks with an antalgic gait.  She has pain on palpation of the dorsal aspect of Lisfranc's joints or the tarsometatarsal joints.  Majority of her pain lies just distal to the tarsometatarsal joints.  This is noted to be the case in both feet.  She also has moderate to severe bunion deformities in the right what appears to be more painful than that of the left.  She states that she has had gout attacks recently.  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  Plan: Discussed etiology pathology conservative versus surgical therapies.  After verbal consent and sterile Betadine and alcohol cleanse I injected 20 mg of Kenalog 5 mg of Marcaine dorsal aspect of the bilateral foot.  I also injected the same mixture around the first metatarsophalangeal joint of the right foot after sterile Betadine skin prep.  Due to longtime kidney problems she cannot take anti-inflammatories or even colchicine.  We will follow-up with her on an as-needed basis.

## 2017-08-12 ENCOUNTER — Other Ambulatory Visit: Payer: Medicare Other

## 2017-08-17 ENCOUNTER — Other Ambulatory Visit: Payer: Self-pay | Admitting: Neurosurgery

## 2017-08-17 DIAGNOSIS — M545 Low back pain: Principal | ICD-10-CM

## 2017-08-17 DIAGNOSIS — M542 Cervicalgia: Secondary | ICD-10-CM

## 2017-08-17 DIAGNOSIS — G8929 Other chronic pain: Secondary | ICD-10-CM

## 2017-09-01 ENCOUNTER — Ambulatory Visit
Admission: RE | Admit: 2017-09-01 | Discharge: 2017-09-01 | Disposition: A | Discharge status: Home / Self Care | Payer: Medicare Other | Source: Ambulatory Visit | Attending: Neurosurgery | Admitting: Neurosurgery

## 2017-09-01 DIAGNOSIS — M545 Low back pain: Secondary | ICD-10-CM | POA: Diagnosis present

## 2017-09-01 DIAGNOSIS — M4316 Spondylolisthesis, lumbar region: Secondary | ICD-10-CM | POA: Diagnosis not present

## 2017-09-01 DIAGNOSIS — Z981 Arthrodesis status: Secondary | ICD-10-CM | POA: Insufficient documentation

## 2017-09-01 DIAGNOSIS — M48061 Spinal stenosis, lumbar region without neurogenic claudication: Secondary | ICD-10-CM | POA: Diagnosis not present

## 2017-09-01 DIAGNOSIS — M47812 Spondylosis without myelopathy or radiculopathy, cervical region: Secondary | ICD-10-CM | POA: Insufficient documentation

## 2017-09-01 DIAGNOSIS — M542 Cervicalgia: Principal | ICD-10-CM

## 2017-09-01 DIAGNOSIS — M4802 Spinal stenosis, cervical region: Secondary | ICD-10-CM | POA: Insufficient documentation

## 2017-09-01 DIAGNOSIS — G8929 Other chronic pain: Secondary | ICD-10-CM | POA: Diagnosis not present

## 2017-09-01 DIAGNOSIS — M5136 Other intervertebral disc degeneration, lumbar region: Secondary | ICD-10-CM | POA: Insufficient documentation

## 2017-09-01 DIAGNOSIS — M4826 Kissing spine, lumbar region: Secondary | ICD-10-CM | POA: Insufficient documentation

## 2017-09-13 ENCOUNTER — Other Ambulatory Visit: Payer: Medicare Other

## 2017-09-20 ENCOUNTER — Ambulatory Visit
Admission: RE | Admit: 2017-09-20 | Discharge: 2017-09-20 | Disposition: A | Discharge status: Home / Self Care | Payer: Medicare Other | Source: Ambulatory Visit | Attending: Family Medicine | Admitting: Family Medicine

## 2017-09-20 DIAGNOSIS — M542 Cervicalgia: Secondary | ICD-10-CM

## 2017-09-20 MED ORDER — IOPAMIDOL (ISOVUE-M 300) INJECTION 61%
1.0000 mL | Freq: Once | INTRAMUSCULAR | Status: AC | PRN
  Administered 2017-09-20: 1 mL via EPIDURAL

## 2017-09-20 MED ORDER — TRIAMCINOLONE ACETONIDE 40 MG/ML IJ SUSP (RADIOLOGY)
60.0000 mg | Freq: Once | INTRAMUSCULAR | Status: AC
  Administered 2017-09-20: 60 mg via EPIDURAL

## 2017-09-20 NOTE — Discharge Instructions (Signed)

## 2017-09-21 ENCOUNTER — Other Ambulatory Visit: Payer: Self-pay | Admitting: Family Medicine

## 2017-09-21 DIAGNOSIS — M542 Cervicalgia: Secondary | ICD-10-CM

## 2017-10-05 ENCOUNTER — Other Ambulatory Visit: Payer: Medicare Other

## 2017-10-12 ENCOUNTER — Ambulatory Visit
Admission: RE | Admit: 2017-10-12 | Discharge: 2017-10-12 | Disposition: A | Discharge status: Home / Self Care | Payer: Medicare Other | Source: Ambulatory Visit | Attending: Family Medicine | Admitting: Family Medicine

## 2017-10-12 DIAGNOSIS — M542 Cervicalgia: Secondary | ICD-10-CM

## 2017-10-12 MED ORDER — IOPAMIDOL (ISOVUE-M 300) INJECTION 61%
1.0000 mL | Freq: Once | INTRAMUSCULAR | Status: AC | PRN
  Administered 2017-10-12: 1 mL via EPIDURAL

## 2017-10-12 MED ORDER — TRIAMCINOLONE ACETONIDE 40 MG/ML IJ SUSP (RADIOLOGY)
60.0000 mg | Freq: Once | INTRAMUSCULAR | Status: AC
  Administered 2017-10-12: 60 mg via EPIDURAL

## 2017-10-18 ENCOUNTER — Other Ambulatory Visit (HOSPITAL_COMMUNITY): Payer: Self-pay | Admitting: Family Medicine

## 2017-10-18 DIAGNOSIS — Z122 Encounter for screening for malignant neoplasm of respiratory organs: Secondary | ICD-10-CM

## 2017-10-24 ENCOUNTER — Ambulatory Visit (INDEPENDENT_AMBULATORY_CARE_PROVIDER_SITE_OTHER): Payer: Medicare Other

## 2017-10-24 DIAGNOSIS — I739 Peripheral vascular disease, unspecified: Secondary | ICD-10-CM

## 2017-10-28 ENCOUNTER — Other Ambulatory Visit: Payer: Self-pay | Admitting: Cardiovascular Disease

## 2017-11-01 ENCOUNTER — Ambulatory Visit (HOSPITAL_COMMUNITY)
Admission: RE | Admit: 2017-11-01 | Discharge: 2017-11-01 | Disposition: A | Discharge status: Home / Self Care | Payer: Medicare Other | Source: Ambulatory Visit | Attending: Family Medicine | Admitting: Family Medicine

## 2017-11-01 ENCOUNTER — Telehealth: Payer: Self-pay | Admitting: *Deleted

## 2017-11-01 DIAGNOSIS — I2510 Atherosclerotic heart disease of native coronary artery without angina pectoris: Secondary | ICD-10-CM | POA: Insufficient documentation

## 2017-11-01 DIAGNOSIS — Z122 Encounter for screening for malignant neoplasm of respiratory organs: Secondary | ICD-10-CM | POA: Insufficient documentation

## 2017-11-01 DIAGNOSIS — I700 Atherosclerosis of aorta: Secondary | ICD-10-CM | POA: Insufficient documentation

## 2017-11-01 DIAGNOSIS — J439 Emphysema, unspecified: Secondary | ICD-10-CM | POA: Diagnosis not present

## 2017-11-01 NOTE — Telephone Encounter (Signed)
Reviewed results and recommendations with patient and she verbalized understanding with no further questions at this time. Let her know that I would have scheduling give her a call to schedule appointment as advised. She was agreeable with no further concerns.

## 2017-11-01 NOTE — Telephone Encounter (Signed)
-----   Message from Minna Merritts, MD sent at 10/30/2017 10:30 AM EDT ----- She does have blockages in her legs, Unclear if they need intervention at this time, Though given her symptoms, we should have her seen by dr. Saunders Revel or Fletcher Anon  Might need LE arterial angiography

## 2017-11-01 NOTE — Telephone Encounter (Signed)
Left voicemail message to call back  

## 2017-11-02 NOTE — Telephone Encounter (Signed)
lmov to schedule sooner in an ov 15 min slot .  Original appt made in error.

## 2017-11-02 NOTE — Telephone Encounter (Signed)
Pt scheduled  

## 2017-11-02 NOTE — Telephone Encounter (Signed)
Scheduled 5/21 with Fletcher Anon

## 2017-11-08 NOTE — Telephone Encounter (Signed)
Spoke with patient to confirm upcoming appointment and she verbalized understanding with no further questions.

## 2017-11-08 NOTE — Telephone Encounter (Signed)
Patient returning call from pam about test results.

## 2017-11-22 ENCOUNTER — Encounter: Payer: Self-pay | Admitting: Cardiovascular Disease

## 2017-11-22 ENCOUNTER — Ambulatory Visit: Payer: Medicare Other | Admitting: Cardiovascular Disease

## 2017-11-22 VITALS — BP 146/60 | HR 50 | Ht 66.0 in | Wt 208.5 lb

## 2017-11-22 DIAGNOSIS — I480 Paroxysmal atrial fibrillation: Secondary | ICD-10-CM | POA: Diagnosis not present

## 2017-11-22 DIAGNOSIS — I2510 Atherosclerotic heart disease of native coronary artery without angina pectoris: Secondary | ICD-10-CM

## 2017-11-22 DIAGNOSIS — I10 Essential (primary) hypertension: Secondary | ICD-10-CM

## 2017-11-22 DIAGNOSIS — I739 Peripheral vascular disease, unspecified: Secondary | ICD-10-CM | POA: Diagnosis not present

## 2017-11-22 NOTE — Progress Notes (Signed)
Cardiology Office Note   Date:  11/22/2017   ID:  [name], DOB Aug 27, 1940, MRN [id number]  PCP:  Lemmie Evens, MD  Cardiologist:  Dr. Rockey Situ  Chief Complaint  Patient presents with  . Other    Referred by Dr. Rockey Situ for abnormal vascular results. Patient c/o pain in the center of chest and SOB "all the time" Meds reviewed verbally with patient.       History of Present Illness: [name] is a 77 y.o. female who was referred by Dr. Rockey Situ for evaluation and management of peripheral arterial disease.  She has known history of coronary artery disease with previous PCI in 2004, paroxysmal atrial fibrillation on long-term anticoagulation with Pradaxa and amiodarone, hyperlipidemia, essential hypertension, prolonged history of tobacco use with COPD, obesity and chronic kidney disease with most recent creatinine of 2.29. She has family history of coronary artery disease.  She smokes half a pack per day and has been doing so for many years. She reports bilateral calf and thigh discomfort worse on the right side and left side.  This happens after walking less than 1 block.  She has no rest pain or lower extremity ulceration. She underwent noninvasive vascular studies which showed an ABI of 0.78 on the right and 0.88 on the left.  Duplex showed borderline significant right common femoral artery disease and SFA disease.  The same was on the left side but less severe. The patient has significant limitations due to exertional dyspnea and continued tobacco use.    Past Medical History:  Diagnosis Date  . CAD (coronary artery disease)   . Carotid artery disease ([place])   . Cervical disc disease   . Chronic renal impairment, stage 3 (moderate) ([place]) 07/19/2016  . COPD (chronic obstructive pulmonary disease) ([place])   . Dysphagia   . Dysrhythmia    AFib  . GERD (gastroesophageal reflux disease)   . History of kidney stones   . Hypertension    2002  . Hypertriglyceridemia   .  Hypothyroidism   . MI (myocardial infarction) ([place])    2003 with one stent placement  . PONV (postoperative nausea and vomiting)     Past Surgical History:  Procedure Laterality Date  . ABDOMINAL HYSTERECTOMY    . AMPUTATION Right 04/12/2014   Procedure: Revision AMPUTATION Right Long DIGIT;  Surgeon: Leanora Cover, MD;  Location: [place];  Service: Orthopedics;  Laterality: Right;  . BALLOON DILATION  05/05/2011   Procedure: BALLOON DILATION;  Surgeon: Rogene Houston, MD;  [facility];  Service: Endoscopy;  Laterality: N/A;  . CARDIAC CATHETERIZATION     with stent 2004  CYPHER 2.75 x 75mm coronary stent in the mid left anterior descending stenotic lesion    Last stress test showed EF of 775  . CARDIOVERSION    . CATARACT EXTRACTION Left 2002  . CATARACT EXTRACTION W/PHACO Right 05/14/2013   Procedure: CATARACT EXTRACTION PHACO AND INTRAOCULAR LENS PLACEMENT RIGHT EYE;  Surgeon: Tonny Branch, MD;  Location: [facility];  Service: Ophthalmology;  Laterality: Right;  CDE:  20.45  . CERVICAL DISC SURGERY    . CHOLECYSTECTOMY    . CORONARY ANGIOPLASTY     1 stent  . ESOPHAGEAL DILATION N/A 04/25/2015   Procedure: ESOPHAGEAL DILATION;  Surgeon: Rogene Houston, MD;  [facility];  Service: Endoscopy;  Laterality: N/A;  . ESOPHAGOGASTRODUODENOSCOPY N/A 04/25/2015   Procedure: ESOPHAGOGASTRODUODENOSCOPY (EGD);  Surgeon: Rogene Houston, MD;  [facility];  Service: Endoscopy;  Laterality: N/A;  1225  . MALONEY DILATION  05/05/2011   Procedure: MALONEY DILATION;  Surgeon: Rogene Houston, MD;  [facility];  Service: Endoscopy;  Laterality: N/A;  . right knee arthroscopy  2010  . SAVORY DILATION  05/05/2011   Procedure: SAVORY DILATION;  Surgeon: Rogene Houston, MD;  [facility];  Service: Endoscopy;  Laterality: N/A;  . SHOULDER SURGERY Right    torn rotator cuff  . TOENAIL EXCISION    . TOTAL ABDOMINAL HYSTERECTOMY W/ BILATERAL  SALPINGOOPHORECTOMY       Current Outpatient Medications  Medication Sig Dispense Refill  . Acetaminophen (TYLENOL PO) Take 500-1,000 mg by mouth every 6 (six) hours as needed (pain).     . ADVAIR DISKUS 500-50 MCG/DOSE AEPB 1 puff 2 (two) times daily.     Marland Kitchen albuterol (PROVENTIL) (2.5 MG/3ML) 0.083% nebulizer solution Take 2.5 mg by nebulization every 6 (six) hours as needed. For shortness of breath    . amiodarone (PACERONE) 200 MG tablet TAKE ONE (1) TABLET EACH DAY 90 tablet 3  . Ascorbic Acid (VITAMIN C) 1000 MG tablet Take 1,000 mg by mouth daily.    . calcitRIOL (ROCALTROL) 0.5 MCG capsule Take 0.5 mcg by mouth daily.    . Cetirizine HCl 10 MG CAPS Take 10 mg by mouth daily.    . Cholecalciferol (VITAMIN D3) 5000 units CAPS Take by mouth daily.    . clonazePAM (KLONOPIN) 1 MG tablet Take 1 mg by mouth as needed for anxiety.     . colchicine 0.6 MG tablet Take one tablet by mouth three times daily at the first sign of a gout attack.  Decrease to one tablet daily if gastric upset occurs. 60 tablet 1  . dabigatran (PRADAXA) 150 MG CAPS capsule Take 1 capsule (150 mg total) by mouth 2 (two) times daily. 180 capsule 3  . furosemide (LASIX) 40 MG tablet Take 40 mg by mouth 2 (two) times daily.    . hydrALAZINE (APRESOLINE) 50 MG tablet Take 1 tablet (50 mg total) by mouth 3 (three) times daily. 90 tablet 7  . isosorbide mononitrate (IMDUR) 60 MG 24 hr tablet Take 1 tablet (60 mg total) by mouth 2 (two) times daily. 60 tablet 3  . levothyroxine (SYNTHROID, LEVOTHROID) 125 MCG tablet Take 125 mcg by mouth daily.    . metoprolol tartrate (LOPRESSOR) 25 MG tablet Take 25 mg by mouth 2 (two) times daily.    . nitroGLYCERIN (NITROLINGUAL) 0.4 MG/SPRAY spray Place 1 spray under the tongue every 5 (five) minutes x 3 doses as needed for chest pain. 12 g 3  . olopatadine (PATANOL) 0.1 % ophthalmic solution Place 1 drop into both eyes daily.    Marland Kitchen omega-3 acid ethyl esters (LOVAZA) 1 G capsule Take 2 g  by mouth 2 (two) times daily.      Marland Kitchen PROAIR HFA 108 (90 BASE) MCG/ACT inhaler every 4 (four) hours as needed.    . promethazine (PHENERGAN) 25 MG tablet Take 1 tablet (25 mg total) by mouth every 6 (six) hours as needed for nausea or vomiting. 30 tablet 3  . ranitidine (ZANTAC) 150 MG capsule Take 150 mg by mouth daily as needed for heartburn.    . vitamin B-12 (CYANOCOBALAMIN) 1000 MCG tablet Take 1,000 mcg by mouth 2 (two) times daily.     No current facility-administered medications for this visit.     Allergies:   Penicillins; Amlodipine; Ceclor [cefaclor];  Codeine; Lipitor [atorvastatin]; and Sulfa antibiotics    Social History:  The patient  reports that she has been smoking cigarettes.  She has a 56.00 pack-year smoking history. She has never used smokeless tobacco. She reports that she does not drink alcohol or use drugs.   Family History:  The patient's family history includes Diabetes in her mother; Heart attack in her father; Heart disease in her brother and sister; Heart failure in her mother; Hypertension in her sister.    ROS:  Please see the history of present illness.   Otherwise, review of systems are positive for none.   All other systems are reviewed and negative.    PHYSICAL EXAM: VS:  BP (!) 146/60 (BP Location: Left Arm, Patient Position: Sitting, Cuff Size: Normal)   Pulse (!) 50   Ht 5\' 6"  (1.676 m)   Wt 208 lb 8 oz (94.6 kg)   BMI 33.65 kg/m  , BMI Body mass index is 33.65 kg/m. GEN: Well nourished, well developed, in no acute distress  HEENT: normal  Neck: no JVD, carotid bruits, or masses Cardiac: RRR; no murmurs, rubs, or gallops,no edema  Respiratory:  c bilateral expiratory wheezing GI: soft, nontender, nondistended, + BS MS: no deformity or atrophy  Skin: warm and dry, no rash Neuro:  Strength and sensation are intact Psych: euthymic mood, full affect Vascular: Femoral pulse is +1 on the right and +2 on the left.  Distal pulses are +1 in the right  and +2 on the left.   EKG:  EKG is ordered today. The ekg ordered today demonstrates sinus bradycardia with sinus arrhythmia and first-degree AV block.  Low voltage.   Recent Labs: No results found for requested labs within last 8760 hours.    Lipid Panel No results found for: CHOL, TRIG, HDL, CHOLHDL, VLDL, LDLCALC, LDLDIRECT    Wt Readings from Last 3 Encounters:  11/22/17 208 lb 8 oz (94.6 kg)  07/27/17 196 lb 8 oz (89.1 kg)  02/22/17 204 lb 12 oz (92.9 kg)      No flowsheet data found.    ASSESSMENT AND PLAN:  1.  Peripheral arterial disease with moderate to severe bilateral leg claudication worse on the right side: Her exam is suggestive of inflow disease on the right plus minus common femoral artery disease.  I doubt significant obstructive infrainguinal disease given palpable distal pulses.  I discussed with her the natural history and management of peripheral arterial disease and recommended aggressive treatment of risk factors and smoking cessation. I am referring her to rehab for a structured walking program. Angiography and endovascular intervention should be reserved for refractory symptoms especially in the setting of advanced chronic kidney disease with most recent creatinine of 2.29.  2.  Coronary artery disease involving native coronary arteries: No recent angina  3.  Tobacco use: I discussed with her the importance of smoking cessation.  4.  Paroxysmal atrial fibrillation: Maintaining in sinus rhythm with amiodarone.  She is on anticoagulation with Pradaxa.  Given underlying chronic kidney disease with creatinine clearance of 35, consider switching to Eliquis.   Disposition:   FU with me in 4 months  Signed,  Kathlyn Sacramento, MD  11/22/2017 2:08 PM    [facility]

## 2017-11-22 NOTE — Patient Instructions (Addendum)
Medication Instructions:  Your physician recommends that you continue on your current medications as directed. Please refer to the Current Medication list given to you today.   Labwork: None ordered  Testing/Procedures: None ordered  Follow-Up: Your physician recommends that you schedule a follow-up appointment in: 4 months with Dr. Zannie Cove have been referred to [facility]. They will contact you directly. It can take up to 2 weeks.   Any Other Special Instructions Will Be Listed Below (If Applicable).     If you need a refill on your cardiac medications before your next appointment, please call your pharmacy.  Coping with Quitting Smoking Quitting smoking is a physical and mental challenge. You will face cravings, withdrawal symptoms, and temptation. Before quitting, work with your health care provider to make a plan that can help you cope. Preparation can help you quit and keep you from giving in. How can I cope with cravings? Cravings usually last for 5-10 minutes. If you get through it, the craving will pass. Consider taking the following actions to help you cope with cravings:  Keep your mouth busy: ? Chew sugar-free gum. ? Suck on hard candies or a straw. ? Brush your teeth.  Keep your hands and body busy: ? Immediately change to a different activity when you feel a craving. ? Squeeze or play with a ball. ? Do an activity or a hobby, like making bead jewelry, practicing needlepoint, or working with wood. ? Mix up your normal routine. ? Take a short exercise break. Go for a quick walk or run up and down stairs. ? Spend time in public places where smoking is not allowed.  Focus on doing something kind or helpful for someone else.  Call a friend or family member to talk during a craving.  Join a support group.  Call a quit line, such as 1-800-QUIT-NOW.  Talk with your health care provider about medicines that might help you cope with  cravings and make quitting easier for you.  How can I deal with withdrawal symptoms? Your body may experience negative effects as it tries to get used to not having nicotine in the system. These effects are called withdrawal symptoms. They may include:  Feeling hungrier than normal.  Trouble concentrating.  Irritability.  Trouble sleeping.  Feeling depressed.  Restlessness and agitation.  Craving a cigarette.  To manage withdrawal symptoms:  Avoid places, people, and activities that trigger your cravings.  Remember why you want to quit.  Get plenty of sleep.  Avoid coffee and other caffeinated drinks. These may worsen some of your symptoms.  How can I handle social situations? Social situations can be difficult when you are quitting smoking, especially in the first few weeks. To manage this, you can:  Avoid parties, bars, and other social situations where people might be smoking.  Avoid alcohol.  Leave right away if you have the urge to smoke.  Explain to your family and friends that you are quitting smoking. Ask for understanding and support.  Plan activities with friends or family where smoking is not an option.  What are some ways I can cope with stress? Wanting to smoke may cause stress, and stress can make you want to smoke. Find ways to manage your stress. Relaxation techniques can help. For example:  Breathe slowly and deeply, in through your nose and out through your mouth.  Listen to soothing, relaxing music.  Talk with a family member or friend about your stress.  Light a candle.  Soak in a bath or take a shower.  Think about a peaceful place.  What are some ways I can prevent weight gain? Be aware that many people gain weight after they quit smoking. However, not everyone does. To keep from gaining weight, have a plan in place before you quit and stick to the plan after you quit. Your plan should include:  Having healthy snacks. When you have a  craving, it may help to: ? Eat plain popcorn, crunchy carrots, celery, or other cut vegetables. ? Chew sugar-free gum.  Changing how you eat: ? Eat small portion sizes at meals. ? Eat 4-6 small meals throughout the day instead of 1-2 large meals a day. ? Be mindful when you eat. Do not watch television or do other things that might distract you as you eat.  Exercising regularly: ? Make time to exercise each day. If you do not have time for a long workout, do short bouts of exercise for 5-10 minutes several times a day. ? Do some form of strengthening exercise, like weight lifting, and some form of aerobic exercise, like running or swimming.  Drinking plenty of water or other low-calorie or no-calorie drinks. Drink 6-8 glasses of water daily, or as much as instructed by your health care provider.  Summary  Quitting smoking is a physical and mental challenge. You will face cravings, withdrawal symptoms, and temptation to smoke again. Preparation can help you as you go through these challenges.  You can cope with cravings by keeping your mouth busy (such as by chewing gum), keeping your body and hands busy, and making calls to family, friends, or a helpline for people who want to quit smoking.  You can cope with withdrawal symptoms by avoiding places where people smoke, avoiding drinks with caffeine, and getting plenty of rest.  Ask your health care provider about the different ways to prevent weight gain, avoid stress, and handle social situations. This information is not intended to replace advice given to you by your health care provider. Make sure you discuss any questions you have with your health care provider. Document Released: 06/18/2016 Document Revised: 06/18/2016 Document Reviewed: 06/18/2016 Elsevier Interactive Patient Education  [employer].

## 2017-11-25 ENCOUNTER — Ambulatory Visit (HOSPITAL_COMMUNITY)
Admission: RE | Admit: 2017-11-25 | Discharge: 2017-11-25 | Disposition: A | Discharge status: Home / Self Care | Payer: Medicare Other | Source: Ambulatory Visit | Attending: Family Medicine | Admitting: Family Medicine

## 2017-11-25 ENCOUNTER — Other Ambulatory Visit (HOSPITAL_COMMUNITY): Payer: Self-pay | Admitting: Family Medicine

## 2017-11-25 DIAGNOSIS — M47816 Spondylosis without myelopathy or radiculopathy, lumbar region: Secondary | ICD-10-CM | POA: Insufficient documentation

## 2017-11-25 DIAGNOSIS — M5440 Lumbago with sciatica, unspecified side: Secondary | ICD-10-CM | POA: Insufficient documentation

## 2017-11-25 DIAGNOSIS — M25552 Pain in left hip: Principal | ICD-10-CM

## 2017-11-25 DIAGNOSIS — M25551 Pain in right hip: Secondary | ICD-10-CM

## 2017-11-25 DIAGNOSIS — M47814 Spondylosis without myelopathy or radiculopathy, thoracic region: Secondary | ICD-10-CM | POA: Insufficient documentation

## 2017-11-25 DIAGNOSIS — M545 Low back pain: Secondary | ICD-10-CM

## 2017-12-07 ENCOUNTER — Telehealth: Payer: Self-pay | Admitting: Cardiovascular Disease

## 2017-12-07 MED ORDER — METOPROLOL TARTRATE 25 MG PO TABS: 25.0000 mg | ORAL_TABLET | Freq: Two times a day (BID) | ORAL | 3 refills | Status: DC

## 2017-12-07 NOTE — Telephone Encounter (Signed)
°*  STAT* If patient is at the pharmacy, call can be transferred to refill team.   1. Which medications need to be refilled? (please list name of each medication and dose if known) Metoprolol 25 mg po BID  2. Which pharmacy/location (including street and city if local pharmacy) is medication to be sent to? [place]   3. Do they need a 30 day or 90 day supply? 90  Patient was told by pharmacy med was d/c

## 2017-12-07 NOTE — Telephone Encounter (Signed)
Called patient. She says Dr Karie Kirks had been refilling her metoprolol but wants Dr Rockey Situ to if he wants her to be on it. Patient last saw Dr Rockey Situ on 07/27/17 and she was on metoprolol at that time and he did not make any changes. Advised I would send in refill and route to Dr Rockey Situ to make sure ok with him.

## 2017-12-07 NOTE — Telephone Encounter (Signed)
Please review medication list with patient. The chart is showing she is still on the metoprolol but the pharmacist is telling her the metoprolol was discontinued.

## 2017-12-07 NOTE — Addendum Note (Signed)
Addended by: Vanessa Ralphs on: 12/07/2017 04:55 PM   Modules accepted: Orders

## 2017-12-12 NOTE — Telephone Encounter (Signed)
On last clinic visit with me she was on metoprolol tartrate 25 twice a day Heart rate was slow in the 50s If she was asymptomatic she could continue the same medication and dose If heart rate continues to run low could decrease the dose down to 12.5 mg twice a day

## 2017-12-13 NOTE — Telephone Encounter (Signed)
No answer. Left message to call back.   

## 2017-12-13 NOTE — Telephone Encounter (Signed)
Patient calling back. She states her heart rate has been running in the mid-50's. She denies dizziness or lightheadedness. She will keep a watch on it and if it gets lower than what is it now, give [place] a call or decrease to 12.5 mg two times a day.

## 2017-12-18 ENCOUNTER — Observation Stay
Admission: EM | Admit: 2017-12-18 | Discharge: 2017-12-20 | Disposition: A | Discharge status: Home Health | Payer: Medicare Other | Attending: Internal Medicine | Admitting: Internal Medicine

## 2017-12-18 ENCOUNTER — Other Ambulatory Visit: Payer: Self-pay

## 2017-12-18 DIAGNOSIS — Z7951 Long term (current) use of inhaled steroids: Secondary | ICD-10-CM | POA: Diagnosis not present

## 2017-12-18 DIAGNOSIS — Z880 Allergy status to penicillin: Secondary | ICD-10-CM | POA: Insufficient documentation

## 2017-12-18 DIAGNOSIS — N3001 Acute cystitis with hematuria: Secondary | ICD-10-CM | POA: Diagnosis present

## 2017-12-18 DIAGNOSIS — I248 Other forms of acute ischemic heart disease: Secondary | ICD-10-CM | POA: Diagnosis not present

## 2017-12-18 DIAGNOSIS — Z6834 Body mass index (BMI) 34.0-34.9, adult: Secondary | ICD-10-CM | POA: Diagnosis not present

## 2017-12-18 DIAGNOSIS — Z79899 Other long term (current) drug therapy: Secondary | ICD-10-CM | POA: Diagnosis not present

## 2017-12-18 DIAGNOSIS — R531 Weakness: Secondary | ICD-10-CM | POA: Diagnosis present

## 2017-12-18 DIAGNOSIS — R296 Repeated falls: Secondary | ICD-10-CM | POA: Diagnosis not present

## 2017-12-18 DIAGNOSIS — J439 Emphysema, unspecified: Secondary | ICD-10-CM | POA: Insufficient documentation

## 2017-12-18 DIAGNOSIS — R269 Unspecified abnormalities of gait and mobility: Secondary | ICD-10-CM | POA: Insufficient documentation

## 2017-12-18 DIAGNOSIS — Y92002 Bathroom of unspecified non-institutional (private) residence single-family (private) house as the place of occurrence of the external cause: Secondary | ICD-10-CM | POA: Diagnosis not present

## 2017-12-18 DIAGNOSIS — N179 Acute kidney failure, unspecified: Secondary | ICD-10-CM | POA: Diagnosis not present

## 2017-12-18 DIAGNOSIS — I739 Peripheral vascular disease, unspecified: Secondary | ICD-10-CM | POA: Insufficient documentation

## 2017-12-18 DIAGNOSIS — R059 Cough, unspecified: Secondary | ICD-10-CM

## 2017-12-18 DIAGNOSIS — D539 Nutritional anemia, unspecified: Secondary | ICD-10-CM | POA: Insufficient documentation

## 2017-12-18 DIAGNOSIS — I129 Hypertensive chronic kidney disease with stage 1 through stage 4 chronic kidney disease, or unspecified chronic kidney disease: Secondary | ICD-10-CM | POA: Insufficient documentation

## 2017-12-18 DIAGNOSIS — Z833 Family history of diabetes mellitus: Secondary | ICD-10-CM | POA: Insufficient documentation

## 2017-12-18 DIAGNOSIS — I2510 Atherosclerotic heart disease of native coronary artery without angina pectoris: Secondary | ICD-10-CM | POA: Diagnosis not present

## 2017-12-18 DIAGNOSIS — F17210 Nicotine dependence, cigarettes, uncomplicated: Secondary | ICD-10-CM | POA: Diagnosis not present

## 2017-12-18 DIAGNOSIS — S41111A Laceration without foreign body of right upper arm, initial encounter: Secondary | ICD-10-CM | POA: Insufficient documentation

## 2017-12-18 DIAGNOSIS — Z882 Allergy status to sulfonamides status: Secondary | ICD-10-CM | POA: Diagnosis not present

## 2017-12-18 DIAGNOSIS — I480 Paroxysmal atrial fibrillation: Secondary | ICD-10-CM | POA: Insufficient documentation

## 2017-12-18 DIAGNOSIS — E669 Obesity, unspecified: Secondary | ICD-10-CM | POA: Insufficient documentation

## 2017-12-18 DIAGNOSIS — M5136 Other intervertebral disc degeneration, lumbar region: Secondary | ICD-10-CM | POA: Insufficient documentation

## 2017-12-18 DIAGNOSIS — R05 Cough: Secondary | ICD-10-CM

## 2017-12-18 DIAGNOSIS — N183 Chronic kidney disease, stage 3 (moderate): Secondary | ICD-10-CM | POA: Insufficient documentation

## 2017-12-18 DIAGNOSIS — Z955 Presence of coronary angioplasty implant and graft: Secondary | ICD-10-CM | POA: Insufficient documentation

## 2017-12-18 DIAGNOSIS — S81812D Laceration without foreign body, left lower leg, subsequent encounter: Secondary | ICD-10-CM | POA: Diagnosis not present

## 2017-12-18 DIAGNOSIS — Z7901 Long term (current) use of anticoagulants: Secondary | ICD-10-CM | POA: Insufficient documentation

## 2017-12-18 DIAGNOSIS — K219 Gastro-esophageal reflux disease without esophagitis: Secondary | ICD-10-CM | POA: Diagnosis not present

## 2017-12-18 DIAGNOSIS — E039 Hypothyroidism, unspecified: Secondary | ICD-10-CM | POA: Diagnosis not present

## 2017-12-18 DIAGNOSIS — W109XXD Fall (on) (from) unspecified stairs and steps, subsequent encounter: Secondary | ICD-10-CM | POA: Diagnosis not present

## 2017-12-18 DIAGNOSIS — W19XXXA Unspecified fall, initial encounter: Secondary | ICD-10-CM

## 2017-12-18 DIAGNOSIS — Z8249 Family history of ischemic heart disease and other diseases of the circulatory system: Secondary | ICD-10-CM | POA: Insufficient documentation

## 2017-12-18 HISTORY — DX: Chronic kidney disease, stage 3 (moderate): N18.3

## 2017-12-18 HISTORY — DX: Atherosclerosis of renal artery: I70.1

## 2017-12-18 HISTORY — DX: Paroxysmal atrial fibrillation: I48.0

## 2017-12-18 HISTORY — DX: Other ill-defined heart diseases: I51.89

## 2017-12-18 HISTORY — DX: Peripheral vascular disease, unspecified: I73.9

## 2017-12-18 HISTORY — DX: Chronic kidney disease, stage 3 unspecified: N18.30

## 2017-12-18 LAB — CBC
HCT: 26.4 % — ABNORMAL LOW (ref 35.0–47.0)
Hemoglobin: 9 g/dL — ABNORMAL LOW (ref 12.0–16.0)
MCH: 36.3 pg — AB (ref 26.0–34.0)
MCHC: 34 g/dL (ref 32.0–36.0)
MCV: 106.7 fL — AB (ref 80.0–100.0)
PLATELETS: 189 10*3/uL (ref 150–440)
RBC: 2.47 MIL/uL — ABNORMAL LOW (ref 3.80–5.20)
RDW: 16.4 % — ABNORMAL HIGH (ref 11.5–14.5)
WBC: 7.5 10*3/uL (ref 3.6–11.0)

## 2017-12-18 LAB — BASIC METABOLIC PANEL
Anion gap: 10 (ref 5–15)
BUN: 43 mg/dL — AB (ref 6–20)
CALCIUM: 9.1 mg/dL (ref 8.9–10.3)
CO2: 25 mmol/L (ref 22–32)
CREATININE: 2.98 mg/dL — AB (ref 0.44–1.00)
Chloride: 104 mmol/L (ref 101–111)
GFR calc non Af Amer: 14 mL/min — ABNORMAL LOW (ref >60)
GFR, EST AFRICAN AMERICAN: 16 mL/min — AB (ref >60)
GLUCOSE: 119 mg/dL — AB (ref 65–99)
Potassium: 4.3 mmol/L (ref 3.5–5.1)
Sodium: 139 mmol/L (ref 135–145)

## 2017-12-18 NOTE — ED Notes (Signed)
Pt feel on Thursday and had to have her lower right leg stictched at [facility]. Husband says she lost a lot of blood and it still oozes. Pt fell tonight getting to the commode. States she feels very weak and got dizzy when she fell

## 2017-12-18 NOTE — ED Triage Notes (Signed)
Reports has history of falls.  Reports fell on Thursday and was seen and treated at [facility] for area on left lower leg.  Reports fell tonight and hurt right arm.  Patient denies hitting her head.  Patient reports feeling weaker than normal.

## 2017-12-19 ENCOUNTER — Other Ambulatory Visit: Payer: Self-pay

## 2017-12-19 ENCOUNTER — Inpatient Hospital Stay: Payer: Medicare Other

## 2017-12-19 ENCOUNTER — Encounter: Payer: Self-pay | Admitting: Nurse Practitioner

## 2017-12-19 DIAGNOSIS — J432 Centrilobular emphysema: Secondary | ICD-10-CM

## 2017-12-19 DIAGNOSIS — I248 Other forms of acute ischemic heart disease: Secondary | ICD-10-CM | POA: Diagnosis not present

## 2017-12-19 DIAGNOSIS — D631 Anemia in chronic kidney disease: Secondary | ICD-10-CM

## 2017-12-19 DIAGNOSIS — W19XXXA Unspecified fall, initial encounter: Secondary | ICD-10-CM

## 2017-12-19 DIAGNOSIS — I25118 Atherosclerotic heart disease of native coronary artery with other forms of angina pectoris: Secondary | ICD-10-CM

## 2017-12-19 DIAGNOSIS — N183 Chronic kidney disease, stage 3 (moderate): Secondary | ICD-10-CM

## 2017-12-19 DIAGNOSIS — N179 Acute kidney failure, unspecified: Secondary | ICD-10-CM | POA: Diagnosis present

## 2017-12-19 LAB — CBC
HEMATOCRIT: 25.4 % — AB (ref 35.0–47.0)
Hemoglobin: 8.6 g/dL — ABNORMAL LOW (ref 12.0–16.0)
MCH: 36.1 pg — ABNORMAL HIGH (ref 26.0–34.0)
MCHC: 33.7 g/dL (ref 32.0–36.0)
MCV: 107.1 fL — ABNORMAL HIGH (ref 80.0–100.0)
PLATELETS: 188 10*3/uL (ref 150–440)
RBC: 2.37 MIL/uL — ABNORMAL LOW (ref 3.80–5.20)
RDW: 16.9 % — ABNORMAL HIGH (ref 11.5–14.5)
WBC: 6.4 10*3/uL (ref 3.6–11.0)

## 2017-12-19 LAB — TROPONIN I
TROPONIN I: 0.1 ng/mL — AB (ref <0.03)
TROPONIN I: 0.07 ng/mL — AB (ref <0.03)
Troponin I: 0.19 ng/mL (ref <0.03)
Troponin I: 0.12 ng/mL (ref <0.03)

## 2017-12-19 LAB — BASIC METABOLIC PANEL
ANION GAP: 11 (ref 5–15)
BUN: 38 mg/dL — ABNORMAL HIGH (ref 6–20)
CALCIUM: 9 mg/dL (ref 8.9–10.3)
CO2: 24 mmol/L (ref 22–32)
Chloride: 104 mmol/L (ref 101–111)
Creatinine, Ser: 2.31 mg/dL — ABNORMAL HIGH (ref 0.44–1.00)
GFR, EST AFRICAN AMERICAN: 22 mL/min — AB (ref >60)
GFR, EST NON AFRICAN AMERICAN: 19 mL/min — AB (ref >60)
Glucose, Bld: 97 mg/dL (ref 65–99)
Potassium: 4 mmol/L (ref 3.5–5.1)
SODIUM: 139 mmol/L (ref 135–145)

## 2017-12-19 LAB — HEMOGLOBIN A1C
Hgb A1c MFr Bld: 5.3 % (ref 4.8–5.6)
Mean Plasma Glucose: 105.41 mg/dL

## 2017-12-19 LAB — URINALYSIS, COMPLETE (UACMP) WITH MICROSCOPIC
BILIRUBIN URINE: NEGATIVE
GLUCOSE, UA: NEGATIVE mg/dL
HGB URINE DIPSTICK: NEGATIVE
Ketones, ur: NEGATIVE mg/dL
NITRITE: NEGATIVE
PH: 5 (ref 5.0–8.0)
Protein, ur: NEGATIVE mg/dL
SPECIFIC GRAVITY, URINE: 1.013 (ref 1.005–1.030)

## 2017-12-19 LAB — TSH: TSH: 8.987 u[IU]/mL — ABNORMAL HIGH (ref 0.350–4.500)

## 2017-12-19 MED ORDER — SODIUM CHLORIDE 0.9 % IV BOLUS: 500.0000 mL | Freq: Once | INTRAVENOUS | Status: DC

## 2017-12-19 MED ORDER — HYDRALAZINE HCL 50 MG PO TABS
50.0000 mg | ORAL_TABLET | Freq: Three times a day (TID) | ORAL | Status: DC
  Administered 2017-12-19 – 2017-12-20 (×4): 50 mg via ORAL
  Filled 2017-12-19 (×4): qty 1

## 2017-12-19 MED ORDER — VITAMIN B-12 1000 MCG PO TABS
1000.0000 ug | ORAL_TABLET | Freq: Two times a day (BID) | ORAL | Status: DC
  Administered 2017-12-19 – 2017-12-20 (×3): 1000 ug via ORAL
  Filled 2017-12-19 (×3): qty 1

## 2017-12-19 MED ORDER — SODIUM CHLORIDE 0.9% FLUSH: 10.0000 mL | INTRAVENOUS | Status: DC | PRN

## 2017-12-19 MED ORDER — CLONAZEPAM 0.5 MG PO TABS: 0.5000 mg | ORAL_TABLET | Freq: Three times a day (TID) | ORAL | Status: DC | PRN

## 2017-12-19 MED ORDER — DABIGATRAN ETEXILATE MESYLATE 150 MG PO CAPS: 150.0000 mg | ORAL_CAPSULE | Freq: Two times a day (BID) | ORAL | Status: DC

## 2017-12-19 MED ORDER — SODIUM CHLORIDE 0.9 % IV SOLN: INTRAVENOUS | Status: DC

## 2017-12-19 MED ORDER — ONDANSETRON HCL 4 MG PO TABS
4.0000 mg | ORAL_TABLET | Freq: Four times a day (QID) | ORAL | Status: DC | PRN
Start: 2017-12-19 — End: 2017-12-20

## 2017-12-19 MED ORDER — IPRATROPIUM-ALBUTEROL 0.5-2.5 (3) MG/3ML IN SOLN
3.0000 mL | Freq: Once | RESPIRATORY_TRACT | Status: AC
  Administered 2017-12-19: 3 mL via RESPIRATORY_TRACT
  Filled 2017-12-19: qty 3

## 2017-12-19 MED ORDER — DOCUSATE SODIUM 100 MG PO CAPS
100.0000 mg | ORAL_CAPSULE | Freq: Two times a day (BID) | ORAL | Status: DC
  Administered 2017-12-19 – 2017-12-20 (×3): 100 mg via ORAL
  Filled 2017-12-19 (×3): qty 1

## 2017-12-19 MED ORDER — VITAMIN D 1000 UNITS PO TABS
2000.0000 [IU] | ORAL_TABLET | Freq: Every day | ORAL | Status: DC
  Administered 2017-12-19 – 2017-12-20 (×2): 2000 [IU] via ORAL
  Filled 2017-12-19 (×2): qty 2

## 2017-12-19 MED ORDER — LEVOTHYROXINE SODIUM 50 MCG PO TABS
125.0000 ug | ORAL_TABLET | Freq: Every day | ORAL | Status: DC
  Administered 2017-12-19 – 2017-12-20 (×2): 125 ug via ORAL
  Filled 2017-12-19 (×2): qty 3

## 2017-12-19 MED ORDER — ACETAMINOPHEN 650 MG RE SUPP
650.0000 mg | Freq: Four times a day (QID) | RECTAL | Status: DC | PRN
Start: 2017-12-19 — End: 2017-12-20

## 2017-12-19 MED ORDER — AMIODARONE HCL 200 MG PO TABS
200.0000 mg | ORAL_TABLET | Freq: Every day | ORAL | Status: DC
  Administered 2017-12-19 – 2017-12-20 (×2): 200 mg via ORAL
  Filled 2017-12-19 (×2): qty 1

## 2017-12-19 MED ORDER — FUROSEMIDE 40 MG PO TABS
40.0000 mg | ORAL_TABLET | Freq: Two times a day (BID) | ORAL | Status: DC
  Administered 2017-12-19: 40 mg via ORAL
  Filled 2017-12-19: qty 1

## 2017-12-19 MED ORDER — NITROGLYCERIN 0.4 MG SL SUBL: 0.4000 mg | SUBLINGUAL_TABLET | SUBLINGUAL | Status: DC | PRN

## 2017-12-19 MED ORDER — FAMOTIDINE 20 MG PO TABS
20.0000 mg | ORAL_TABLET | Freq: Every day | ORAL | Status: DC
  Administered 2017-12-19 – 2017-12-20 (×2): 20 mg via ORAL
  Filled 2017-12-19 (×2): qty 1

## 2017-12-19 MED ORDER — ACETAMINOPHEN 325 MG PO TABS
650.0000 mg | ORAL_TABLET | Freq: Four times a day (QID) | ORAL | Status: DC | PRN
  Administered 2017-12-19: 650 mg via ORAL
  Filled 2017-12-19: qty 2

## 2017-12-19 MED ORDER — FLUTICASONE FUROATE-VILANTEROL 200-25 MCG/INH IN AEPB
1.0000 | INHALATION_SPRAY | Freq: Every day | RESPIRATORY_TRACT | Status: DC
Start: 2017-12-19 — End: 2017-12-20
  Administered 2017-12-19 – 2017-12-20 (×2): 1 via RESPIRATORY_TRACT
  Filled 2017-12-19: qty 28

## 2017-12-19 MED ORDER — LORATADINE 10 MG PO TABS
10.0000 mg | ORAL_TABLET | Freq: Every day | ORAL | Status: DC
  Administered 2017-12-19 – 2017-12-20 (×2): 10 mg via ORAL
  Filled 2017-12-19 (×2): qty 1

## 2017-12-19 MED ORDER — IPRATROPIUM-ALBUTEROL 0.5-2.5 (3) MG/3ML IN SOLN
3.0000 mL | Freq: Once | RESPIRATORY_TRACT | Status: AC
  Administered 2017-12-20: 3 mL via RESPIRATORY_TRACT

## 2017-12-19 MED ORDER — ADULT MULTIVITAMIN W/MINERALS CH
1.0000 | ORAL_TABLET | Freq: Every day | ORAL | Status: DC
  Administered 2017-12-20: 1 via ORAL
  Filled 2017-12-19: qty 1

## 2017-12-19 MED ORDER — CALCITRIOL 0.25 MCG PO CAPS
0.5000 ug | ORAL_CAPSULE | Freq: Every day | ORAL | Status: DC
  Administered 2017-12-19 – 2017-12-20 (×2): 0.5 ug via ORAL
  Filled 2017-12-19 (×2): qty 2

## 2017-12-19 MED ORDER — NITROGLYCERIN 0.4 MG/SPRAY TL SOLN: 1.0000 | Status: DC | PRN

## 2017-12-19 MED ORDER — VITAMIN C 500 MG PO TABS
1000.0000 mg | ORAL_TABLET | Freq: Every day | ORAL | Status: DC
  Administered 2017-12-19 – 2017-12-20 (×2): 1000 mg via ORAL
  Filled 2017-12-19 (×2): qty 2

## 2017-12-19 MED ORDER — COLCHICINE 0.6 MG PO TABS: 0.6000 mg | ORAL_TABLET | Freq: Every day | ORAL | Status: DC | PRN

## 2017-12-19 MED ORDER — OMEGA-3-ACID ETHYL ESTERS 1 G PO CAPS
2.0000 g | ORAL_CAPSULE | Freq: Two times a day (BID) | ORAL | Status: DC
  Administered 2017-12-19 – 2017-12-20 (×3): 2 g via ORAL
  Filled 2017-12-19 (×3): qty 2

## 2017-12-19 MED ORDER — SODIUM CHLORIDE 0.9% FLUSH
10.0000 mL | Freq: Two times a day (BID) | INTRAVENOUS | Status: DC
Start: 2017-12-19 — End: 2017-12-20
  Administered 2017-12-19: 30 mL

## 2017-12-19 MED ORDER — ISOSORBIDE MONONITRATE ER 30 MG PO TB24
60.0000 mg | ORAL_TABLET | Freq: Two times a day (BID) | ORAL | Status: DC
  Administered 2017-12-19 – 2017-12-20 (×3): 60 mg via ORAL
  Filled 2017-12-19 (×4): qty 2

## 2017-12-19 MED ORDER — FOSFOMYCIN TROMETHAMINE 3 G PO PACK
3.0000 g | PACK | Freq: Once | ORAL | Status: AC
  Administered 2017-12-19: 3 g via ORAL
  Filled 2017-12-19: qty 3

## 2017-12-19 MED ORDER — BOOST / RESOURCE BREEZE PO LIQD CUSTOM
1.0000 | Freq: Three times a day (TID) | ORAL | Status: DC
  Administered 2017-12-19 (×2): 1 via ORAL

## 2017-12-19 MED ORDER — DABIGATRAN ETEXILATE MESYLATE 75 MG PO CAPS
75.0000 mg | ORAL_CAPSULE | Freq: Two times a day (BID) | ORAL | Status: DC
  Administered 2017-12-19 – 2017-12-20 (×3): 75 mg via ORAL
  Filled 2017-12-19 (×4): qty 1

## 2017-12-19 MED ORDER — ONDANSETRON HCL 4 MG/2ML IJ SOLN: 4.0000 mg | Freq: Four times a day (QID) | INTRAMUSCULAR | Status: DC | PRN

## 2017-12-19 NOTE — ED Provider Notes (Signed)
[facility] Emergency Department Provider Note   ____________________________________________   First MD Initiated Contact with Patient 12/19/17 0222     (approximate)  I have reviewed the triage vital signs and the nursing notes.   HISTORY  Chief Complaint Fall and Weakness    HPI [name] is a 77 y.o. female who comes into the hospital today after a fall.  The patient states that she busted her arm open.  She also fell on Thursday and injured her leg.  The patient states that sometimes she loses her balance.  When she fell on Thursday she went to [facility] and states that they put stitches in her left leg but the wound kept bleeding.  The patient is on blood thinners.  She states that she is felt a little bit lightheaded and weak since then and she thinks that she may have lost a lot of blood previously.  She denies any fevers or pain with urination.  The patient denies any nausea or vomiting.  She states that when she fell today she did not hit her head.  She decided to come into the hospital today for evaluation.  The patient does not wear oxygen at home regularly.  She is here today for evaluation.  The patient rates her pain a 10 out of 10 in intensity.   Past Medical History:  Diagnosis Date  . CAD (coronary artery disease)   . Carotid artery disease ([place])   . Cervical disc disease   . Chronic renal impairment, stage 3 (moderate) ([place]) 07/19/2016  . COPD (chronic obstructive pulmonary disease) ([place])   . Dysphagia   . Dysrhythmia    AFib  . GERD (gastroesophageal reflux disease)   . History of kidney stones   . Hypertension    2002  . Hypertriglyceridemia   . Hypothyroidism   . MI (myocardial infarction) ([place])    2003 with one stent placement  . PONV (postoperative nausea and vomiting)     Patient Active Problem List   Diagnosis Date Noted  . AKI (acute kidney injury) ([place]) 12/19/2017  . COPD (chronic obstructive pulmonary disease) ([place])  07/26/2017  . Chronic renal impairment, stage 3 (moderate) ([place]) 07/19/2016  . Back pain 08/01/2015  . Bradycardia 08/01/2015  . Hypothyroidism 01/27/2015  . HTN (hypertension) 01/27/2015  . Neck pain 01/18/2014  . Paroxysmal atrial fibrillation ([place]) 03/26/2013  . Fatigue 03/26/2013  . SOB (shortness of breath) 03/26/2013  . Chest discomfort 03/26/2013  . CAD (coronary artery disease) 03/26/2013  . Smoker 03/26/2013  . Hyperlipidemia 03/26/2013    Past Surgical History:  Procedure Laterality Date  . ABDOMINAL HYSTERECTOMY    . AMPUTATION Right 04/12/2014   Procedure: Revision AMPUTATION Right Long DIGIT;  Surgeon: Leanora Cover, MD;  Location: [place];  Service: Orthopedics;  Laterality: Right;  . BALLOON DILATION  05/05/2011   Procedure: BALLOON DILATION;  Surgeon: Rogene Houston, MD;  [facility];  Service: Endoscopy;  Laterality: N/A;  . CARDIAC CATHETERIZATION     with stent 2004  CYPHER 2.75 x 64mm coronary stent in the mid left anterior descending stenotic lesion    Last stress test showed EF of 775  . CARDIOVERSION    . CATARACT EXTRACTION Left 2002  . CATARACT EXTRACTION W/PHACO Right 05/14/2013   Procedure: CATARACT EXTRACTION PHACO AND INTRAOCULAR LENS PLACEMENT RIGHT EYE;  Surgeon: Tonny Branch, MD;  Location: [facility];  Service: Ophthalmology;  Laterality: Right;  CDE:  20.45  .  CERVICAL DISC SURGERY    . CHOLECYSTECTOMY    . CORONARY ANGIOPLASTY     1 stent  . ESOPHAGEAL DILATION N/A 04/25/2015   Procedure: ESOPHAGEAL DILATION;  Surgeon: Rogene Houston, MD;  [facility];  Service: Endoscopy;  Laterality: N/A;  . ESOPHAGOGASTRODUODENOSCOPY N/A 04/25/2015   Procedure: ESOPHAGOGASTRODUODENOSCOPY (EGD);  Surgeon: Rogene Houston, MD;  [facility];  Service: Endoscopy;  Laterality: N/A;  1225  . MALONEY DILATION  05/05/2011   Procedure: MALONEY DILATION;  Surgeon: Rogene Houston, MD;  [facility];  Service: Endoscopy;   Laterality: N/A;  . right knee arthroscopy  2010  . SAVORY DILATION  05/05/2011   Procedure: SAVORY DILATION;  Surgeon: Rogene Houston, MD;  [facility];  Service: Endoscopy;  Laterality: N/A;  . SHOULDER SURGERY Right    torn rotator cuff  . TOENAIL EXCISION    . TOTAL ABDOMINAL HYSTERECTOMY W/ BILATERAL SALPINGOOPHORECTOMY      Prior to Admission medications   Medication Sig Start Date End Date Taking? Authorizing Provider  Acetaminophen (TYLENOL PO) Take 500-1,000 mg by mouth every 6 (six) hours as needed (pain).    Yes [provider]  ADVAIR DISKUS 500-50 MCG/DOSE AEPB 1 puff 2 (two) times daily.  02/28/13  Yes [provider]  albuterol (PROVENTIL) (2.5 MG/3ML) 0.083% nebulizer solution Take 2.5 mg by nebulization every 6 (six) hours as needed. For shortness of breath   Yes [provider]  amiodarone (PACERONE) 200 MG tablet TAKE ONE (1) TABLET EACH DAY 02/23/17  Yes Gollan, Kathlene November, MD  Ascorbic Acid (VITAMIN C) 1000 MG tablet Take 1,000 mg by mouth daily.   Yes [provider]  calcitRIOL (ROCALTROL) 0.5 MCG capsule Take 0.5 mcg by mouth daily. 01/06/17  Yes [provider]  Cetirizine HCl 10 MG CAPS Take 10 mg by mouth daily.   Yes [provider]  cholecalciferol (VITAMIN D) 1000 units tablet Take 2,000 Units by mouth daily.   Yes [provider]  clonazePAM (KLONOPIN) 1 MG tablet Take 0.5-1.5 mg by mouth 3 (three) times daily as needed for anxiety. Takes 0.5mg  three times a day and takes 1.5mg  at bedtime   Yes [provider]  colchicine 0.6 MG tablet Take one tablet by mouth three times daily at the first sign of a gout attack.  Decrease to one tablet daily if gastric upset occurs. 12/13/16  Yes Hyatt, Max T, DPM  dabigatran (PRADAXA) 150 MG CAPS capsule Take 1 capsule (150 mg total) by mouth 2 (two) times daily. 04/29/15  Yes Gollan, Kathlene November, MD  furosemide (LASIX) 40 MG tablet Take 40 mg by mouth  2 (two) times daily. 01/06/17  Yes [provider]  hydrALAZINE (APRESOLINE) 50 MG tablet Take 1 tablet (50 mg total) by mouth 3 (three) times daily. 02/22/17  Yes Minna Merritts, MD  isosorbide mononitrate (IMDUR) 60 MG 24 hr tablet Take 1 tablet (60 mg total) by mouth 2 (two) times daily. 05/04/17  Yes Gollan, Kathlene November, MD  levothyroxine (SYNTHROID, LEVOTHROID) 125 MCG tablet Take 125 mcg by mouth daily.   Yes [provider]  nitroGLYCERIN (NITROLINGUAL) 0.4 MG/SPRAY spray Place 1 spray under the tongue every 5 (five) minutes x 3 doses as needed for chest pain. 07/27/17  Yes Gollan, Kathlene November, MD  omega-3 acid ethyl esters (LOVAZA) 1 G capsule Take 2 g by mouth 2 (two) times daily.     Yes [provider]  vitamin B-12 (CYANOCOBALAMIN) 1000 MCG tablet Take 1,000 mcg by mouth 2 (two) times daily.   Yes [provider]  metoprolol tartrate (LOPRESSOR) 25 MG tablet Take 1 tablet (25 mg total) by mouth 2 (two) times daily. Patient not taking: Reported on 12/19/2017 12/07/17   Minna Merritts, MD  promethazine (PHENERGAN) 25 MG tablet Take 1 tablet (25 mg total) by mouth every 6 (six) hours as needed for nausea or vomiting. Patient not taking: Reported on 12/19/2017 07/29/17   Minna Merritts, MD  ranitidine (ZANTAC) 150 MG capsule Take 150 mg by mouth daily as needed for heartburn.    [provider]    Allergies Ciprofloxacin hcl; Penicillins; Amlodipine; Ceclor [cefaclor]; Codeine; Lipitor [atorvastatin]; and Sulfa antibiotics  Family History  Problem Relation Age of Onset  . Diabetes Mother   . Heart failure Mother   . Heart attack Father   . Heart disease Sister   . Heart disease Brother   . Hypertension Sister   . Colon cancer Neg Hx     Social History Social History   Tobacco Use  . Smoking status: Current Every Day Smoker    Packs/day: 1.00    Years: 56.00    Pack years: 56.00    Types: Cigarettes  . Smokeless tobacco: Never Used    Substance Use Topics  . Alcohol use: No    Alcohol/week: 0.6 oz    Types: 1 Cans of beer per week  . Drug use: No    Review of Systems  Constitutional: No fever/chills Eyes: No visual changes. ENT: No sore throat. Cardiovascular: Denies chest pain. Respiratory: Denies shortness of breath. Gastrointestinal: No abdominal pain.  No nausea, no vomiting.  No diarrhea.  No constipation. Genitourinary: Negative for dysuria. Musculoskeletal: Right arm pain Skin: Skin tear to right arm Neurological: lightheadedness and weakness   ____________________________________________   PHYSICAL EXAM:  VITAL SIGNS: ED Triage Vitals  Enc Vitals Group     BP 12/18/17 2321 (!) 126/51     Pulse Rate 12/18/17 2321 (!) 48     Resp 12/18/17 2321 20     Temp 12/18/17 2321 99.4 F (37.4 C)     Temp Source 12/18/17 2321 Oral     SpO2 12/18/17 2321 94 %     Weight 12/18/17 2322 195 lb (88.5 kg)     Height 12/18/17 2322 5\' 6"  (1.676 m)     Head Circumference --      Peak Flow --      Pain Score 12/18/17 2322 10     Pain Loc --      Pain Edu? --      Excl. in [place]? --     Constitutional: Alert and oriented. Well appearing and in moderate distress. Eyes: Conjunctivae are normal. PERRL. EOMI. Head: Atraumatic. Nose: No congestion/rhinnorhea. Mouth/Throat: Mucous membranes are moist.  Oropharynx non-erythematous. Cardiovascular: Normal rate, regular rhythm. Grossly normal heart sounds.  Good peripheral circulation. Respiratory: Normal respiratory effort.  No retractions. Lungs CTAB. Gastrointestinal: Soft and nontender. No distention. Positive bowel sounds Musculoskeletal: No lower extremity tenderness nor edema.   Neurologic:  Normal speech and language.  Cranial nerves II through XII are grossly intact with no focal motor neuro deficit Skin:  Skin is warm, dry skin tear to right arm above the elbow. Wrapped wound to LLE Psychiatric: Mood and affect are normal.    ____________________________________________   LABS (all labs ordered are listed, but only abnormal results are displayed)  Labs Reviewed  BASIC METABOLIC PANEL - Abnormal; Notable for the following components:      Result Value   Glucose, Bld 119 (*)    BUN 43 (*)    Creatinine, Ser 2.98 (*)    GFR calc non Af Amer 14 (*)    GFR calc Af Amer 16 (*)    All other components within normal limits  CBC - Abnormal; Notable for the following components:   RBC 2.47 (*)    Hemoglobin 9.0 (*)    HCT 26.4 (*)    MCV 106.7 (*)    MCH 36.3 (*)    RDW 16.4 (*)    All other components within normal limits  URINALYSIS, COMPLETE (UACMP) WITH MICROSCOPIC - Abnormal; Notable for the following components:   Color, Urine YELLOW (*)    APPearance CLOUDY (*)    Leukocytes, UA LARGE (*)    Bacteria, UA MANY (*)    All other components within normal limits  URINE CULTURE  TROPONIN I   ____________________________________________  EKG  ED ECG REPORT I, Loney Hering, the attending physician, personally viewed and interpreted this ECG.   Date: 12/18/2017  EKG Time: 2322  Rate: 45  Rhythm: sinus bradycardia  Axis: normal  Intervals:none  ST&T Change: Flipped T wave in lead I, II, V6, bradycardia old but flipped to waves are new.  ____________________________________________  RADIOLOGY  ED MD interpretation:  none  Official radiology report(s): No results found.  ____________________________________________   PROCEDURES  Procedure(s) performed: None  Procedures  Critical Care performed: No  ____________________________________________   INITIAL IMPRESSION / ASSESSMENT AND PLAN / ED COURSE  As part of my medical decision making, I reviewed the following data within the electronic MEDICAL RECORD NUMBER Notes from prior ED visits and [place] Controlled Substance Database   This is a 77 year old female who comes into the hospital today after a fall.  The patient has a skin  tear and some pain to her arm but she has good range of motion without any significant bruising or deformity.  We did check some blood work on the patient to include a CBC, BMP, urinalysis.  The patient's urinalysis shows 11-20 white blood cells and many bacteria which is concerning for UTI.  The patient's creatinine is 2.98 but she does have a history of chronic kidney disease and her hemoglobin is 9.0.  I feel that the patient's UTI may be what is causing her weakness.  I did give the patient a dose of fosfomycin as she has many antibiotic allergies.  I also give the patient a 500 mm bolus of normal saline.  I will add a troponin onto the patient's blood work and she will be admitted to the hospitalist service for further evaluation for her weakness and frequent falls.      ____________________________________________   FINAL CLINICAL IMPRESSION(S) / ED DIAGNOSES  Final diagnoses:  Weakness  Acute cystitis with hematuria  Fall, initial encounter     ED Discharge Orders    None       Note:  This document was prepared using Dragon voice recognition software and may include unintentional dictation errors.    Loney Hering, MD 12/19/17 [phone]

## 2017-12-19 NOTE — Progress Notes (Signed)
PT Hold Note  Patient Details Name: [name] MRN: [id number] DOB: 1941-05-09   Evaluation Hold:    Reason Eval/Treat Not Completed: Medical issues which prohibited therapy. Order received and chart reviewed. Troponin ordered by ED provider and it returned elevated at 0.19 ng/mL. Pt with prior history of CAD s/p PCI and MI. Will hold PT evaluation until plan of care established regarding these results.   Lyndel Safe Aliene Tamura PT, DPT   Jedrick Hutcherson 12/19/2017, 8:58 AM

## 2017-12-19 NOTE — Consult Note (Signed)
[facility] Nurse wound consult note Reason for Consult: wounds left lower leg Wound type: Posterior calf with skin tear that has had the flap sutured in place.  Anterior left lower leg with two skin tear areas without flaps.  Fall at home resulted in wounds Injury POA: Yes Apply Mepitel to all skin tear areas. Cover with ABD pads, secure with kerlex.  Performed by me at the time of my eval in [facility] room 137.  Patient tolerated well. Monitor the wound area(s) for worsening of condition such as: Signs/symptoms of infection,  Increase in size,  Development of or worsening of odor, Development of pain, or increased pain at the affected locations.  Notify the medical team if any of these develop.  Thank you for the consult.  Discussed plan of care with the patient and bedside nurse.  [facility] nurse will not follow at this time.  Please re-consult the [facility] team if needed.  Val Riles, RN, MSN, CWOCN, CNS-BC, pager [phone]

## 2017-12-19 NOTE — Progress Notes (Addendum)
Pt IV got infiltrated for the second time. First one was at 0400 IV team consult was place. Page prime this time. Will continue to monitor.  Update 0650. Doctor Marcille Blanco called and states to check with dayshift doctor what they want to do with pt IV. Doctor Marcille Blanco was also asked if he wants to place pt on cardiac monitor with her a-fib. Doctor Marcille Blanco states pt do not need to be on it. Will inform incoming nurse. Will continue to monitor.

## 2017-12-19 NOTE — Progress Notes (Signed)
Advanced care plan. Purpose of the Encounter: CODE STATUS Parties in Attendance:Patient Patient's Decision Capacity:Good Subjective/Patient's story: Presented for fall Objective/Medical story Has acute Kidney injury and elevated troponins Needs IV fluids Has copd Goals of care determination:  Advance care directives and goals of care discussed Patient wants everything done which includes cardiac resuscitation, intubation and ventilator if need arises. CODE STATUS: Full code Time spent discussing advanced care planning: 16 minutes

## 2017-12-19 NOTE — ED Notes (Signed)
IV attempt x 4 unsuccessful.

## 2017-12-19 NOTE — Progress Notes (Addendum)
Lab called and reported a criticial value of 0.19 troponin level. No complaints of chaest pain. Doctor daimond notfied. No new order was place. Will continue to monitor.  Update 0403: Pt Iv got infiltrated. Pt hard is hard to start IV. Place IV team Consult. Will continue to monitor.

## 2017-12-19 NOTE — Progress Notes (Signed)
[facility] [place] NAME: [name]    MR#:  [id number]  DATE OF BIRTH:  Sep 12, 1940  SUBJECTIVE:  CHIEF COMPLAINT:   Chief Complaint  Patient presents with  . Fall  . Weakness  Patient seen and evaluated today  has generalized weakness  no chest pain No complaints of any shortness of breath  REVIEW OF SYSTEMS:    ROS  CONSTITUTIONAL: No documented fever. Has fatigue, weakness. No weight gain, no weight loss.  EYES: No blurry or double vision.  ENT: No tinnitus. No postnasal drip. No redness of the oropharynx.  RESPIRATORY: No cough, no wheeze, no hemoptysis. No dyspnea.  CARDIOVASCULAR: No chest pain. No orthopnea. No palpitations. No syncope.  GASTROINTESTINAL: No nausea, no vomiting or diarrhea. No abdominal pain. No melena or hematochezia.  GENITOURINARY: No dysuria or hematuria.  ENDOCRINE: No polyuria or nocturia. No heat or cold intolerance.  HEMATOLOGY: No anemia. No bruising. No bleeding.  INTEGUMENTARY: Has skin tear to left leg and right arm MUSCULOSKELETAL: No arthritis. No swelling. No gout.  NEUROLOGIC: No numbness, tingling, or ataxia. No seizure-type activity.  PSYCHIATRIC: No anxiety. No insomnia. No ADD.   DRUG ALLERGIES:   Allergies  Allergen Reactions  . Ciprofloxacin Hcl Anaphylaxis  . Penicillins Anaphylaxis    Near death experience,  Has patient had a PCN reaction causing immediate rash, facial/tongue/throat swelling, SOB or lightheadedness with hypotension: Yes Has patient had a PCN reaction causing severe rash involving mucus membranes or skin necrosis: No Has patient had a PCN reaction that required hospitalization: Yes Has patient had a PCN reaction occurring within the last 10 years: No If all of the above answers are "NO", then may proceed with Cephalosporin use.  . Amlodipine     Leg swelling  . Ceclor [Cefaclor] Nausea And Vomiting  . Codeine Nausea Only and Other (See Comments)    Cramps,  nausea  . Lipitor [Atorvastatin] Other (See Comments)    Myalgias   . Sulfa Antibiotics Rash         VITALS:  Blood pressure (!) 138/53, pulse (!) 59, temperature 98 F (36.7 C), temperature source Oral, resp. rate 14, height 5\' 6"  (1.676 m), weight 96 kg (211 lb 10.3 oz), SpO2 95 %.  PHYSICAL EXAMINATION:   Physical Exam  GENERAL:  77 y.o.-year-old patient lying in the bed with no acute distress.  EYES: Pupils equal, round, reactive to light and accommodation. No scleral icterus. Extraocular muscles intact.  HEENT: Head atraumatic, normocephalic. Oropharynx dry and nasopharynx clear.  NECK:  Supple, no jugular venous distention. No thyroid enlargement, no tenderness.  LUNGS: Normal breath sounds bilaterally, no wheezing, rales, rhonchi. No use of accessory muscles of respiration.  CARDIOVASCULAR: S1, S2 normal. No murmurs, rubs, or gallops.  ABDOMEN: Soft, nontender, nondistended. Bowel sounds present. No organomegaly or mass.  EXTREMITIES: No cyanosis, clubbing or edema b/l.   Has skin tear right arm and left leg  NEUROLOGIC: Cranial nerves II through XII are intact. No focal Motor or sensory deficits b/l.   PSYCHIATRIC: The patient is alert and oriented x 3.  SKIN: Skin tear in left leg and right arm  LABORATORY PANEL:   CBC Recent Labs  Lab 12/19/17 1138  WBC 6.4  HGB 8.6*  HCT 25.4*  PLT 188   ------------------------------------------------------------------------------------------------------------------ Chemistries  Recent Labs  Lab 12/19/17 1138  NA 139  K 4.0  CL 104  CO2 24  GLUCOSE 97  BUN 38*  CREATININE  2.31*  CALCIUM 9.0   ------------------------------------------------------------------------------------------------------------------  Cardiac Enzymes Recent Labs  Lab 12/19/17 1138  TROPONINI 0.12*   ------------------------------------------------------------------------------------------------------------------  RADIOLOGY:  No results  found.   ASSESSMENT AND PLAN:  77 year old female patient with history of coronary disease, PCI, hypertension, COPD, hypothyroidism, chronic kidney disease stage III, GERD  currently under hospitalist service for fall   -Acute kidney injury IV fluid hydration Hold diuretics Monitor renal function  -Abnormal troponin Could be from renal failure  cycle troponin and cardiology consultation  Coronary artery disease Continue imdur  - Chronic atrial fibrillation Continue oral amiodarone Continue anticoagulation with pradaxa  - COPD Continue inhaled steroids and Long acting broncho dilators  - Lower extremity wound Wound care consultation  All the records are reviewed and case discussed with Care Management/Social Worker. Management plans discussed with the patient, family and they are in agreement.  CODE STATUS: Full code  DVT Prophylaxis: SCDs  TOTAL TIME TAKING CARE OF THIS PATIENT: 34 minutes.   POSSIBLE D/C IN 2 to 3 DAYS, DEPENDING ON CLINICAL CONDITION.  Saundra Shelling M.D on 12/19/2017 at 1:32 PM  Between 7am to 6pm - Pager - [phone]  After 6pm go to [email or web address] - password [email or web address] [facility]  Office  [phone]  CC: Primary care physician; Lemmie Evens, MD  Note: This dictation was prepared with Dragon dictation along with smaller phrase technology. Any transcriptional errors that result from this process are unintentional.

## 2017-12-19 NOTE — Progress Notes (Signed)
Dabigatran changed to 75 mg BID for a-fib indication and CrCl 15-50.

## 2017-12-19 NOTE — Plan of Care (Signed)

## 2017-12-19 NOTE — Progress Notes (Signed)
Initial Nutrition Assessment  DOCUMENTATION CODES:   Obesity unspecified  INTERVENTION:   Boost Breeze po TID, each supplement provides 250 kcal and 9 grams of protein  Ordered snacks (yogurt, peanut butter crackers)  MVI  Encouraged PO intake   NUTRITION DIAGNOSIS:   Inadequate oral intake related to (AKI) as evidenced by per patient/family report, meal completion < 50%.  GOAL:   Patient will meet greater than or equal to 90% of their needs  MONITOR:   PO intake, Supplement acceptance, I & O's  REASON FOR ASSESSMENT:   Malnutrition Screening Tool    ASSESSMENT:   Patient with PMH CAD s/p PCI, HTN, COPD, Hypothyroidism, presents with repeated falls, AKI.   Spoke with patient at bedside. She reports only eating pears for lunch. Has not had much of an appetite or eaten well since she fell last week. States she has continued to drink fluid.  Normal PO intake consists of 2 pieces of toast and 2-3 slices of bacon for breakfast.  Apple sauce, jello, yogurt, and a cookie for lunch. Eats whatever she may cook for dinner, had BBQ and hushpuppies yesterday.  Asked patient about options to improve PO intake, she was open to snacks being ordered for her. Also got a boost breeze and allowed her to try it, patient was amenable to drinking during admit. Encouraged PO intake as patient only ate pears for lunch but she says "I could do to lose some weight."  Reports a UBW of 189-190 pounds. Per chart she has gained weight over the past 5 months, no concern for weight loss.  Labs reviewed:  BUN/Cr 43/2.98  Medications reviewed and include:  Vitamin D, Colace, Omega-3s, Vitamin B12, Vitamin C   NUTRITION - FOCUSED PHYSICAL EXAM:    Most Recent Value  Orbital Region  No depletion  Upper Arm Region  No depletion  Thoracic and Lumbar Region  No depletion  Buccal Region  No depletion  Temple Region  No depletion  Clavicle Bone Region  No depletion  Clavicle and Acromion Bone  Region  No depletion  Scapular Bone Region  No depletion  Dorsal Hand  No depletion  Patellar Region  No depletion  Anterior Thigh Region  No depletion  Posterior Calf Region  No depletion  Edema (RD Assessment)  Moderate       Diet Order:   Diet Order           Diet Heart Room service appropriate? Yes; Fluid consistency: Thin  Diet effective now          EDUCATION NEEDS:   No education needs have been identified at this time  Skin:  Skin Assessment: Reviewed RN Assessment(Skin tear to R Arm, L leg)  Last BM:  12/18/2017  Height:   Ht Readings from Last 1 Encounters:  12/18/17 5\' 6"  (1.676 m)    Weight:   Wt Readings from Last 1 Encounters:  12/19/17 211 lb 10.3 oz (96 kg)    Ideal Body Weight:  59.09 kg  BMI:  Body mass index is 34.16 kg/m.  Estimated Nutritional Needs:   Kcal:  1600-1800 calories   Protein:  86-115 grams  Fluid:  UOP +560mL    Satira Anis. Eliel Dudding, MS, RD LDN Inpatient Clinical Dietitian Pager [phone]

## 2017-12-19 NOTE — Consult Note (Signed)
Cardiology Consult    Patient ID: [name] MRN: [id number], DOB/AGE: [birth year]   Admit date: 12/18/2017 Date of Consult: 12/19/2017  Primary Physician: Lemmie Evens, MD Primary Cardiologist: Ida Rogue, MD Requesting Provider: Neta Mends  Patient Profile    [name] is a 77 y.o. female with a history of CAD s/p LAD stenting in 2004, HTN, HL, diast dysfxn, PAF, CKD III, hypothyroidism, obesity, claudication/PAD, and GERD who is being seen today for the evaluation of troponin elevation following fall at the request of Dr. Estanislado Pandy.  Past Medical History   Past Medical History:  Diagnosis Date  . CAD (coronary artery disease)    a. 10/2002 PCI: LAD 24m (2.75x23 Cypher DES); b. 09/2004 Cath: LM nl, LAD 40p, patent stent, D1 30p, LCX 20p, diff dzs throughout, OM1 50p, OM2 60p, OM3 40p, RCA 40p-->Med Rx; c. 10/2012 MV: No ischemia/infarct.  . Carotid artery disease ([place])   . Cervical disc disease   . CKD (chronic kidney disease), stage III ([place])   . COPD (chronic obstructive pulmonary disease) ([place])   . Diastolic dysfunction    a. 03/2017 Echo: Ef 60-65%, Gr2 DD, mild MR, mildly dil LA/RA, mildly to mod dil RV w/ nl fxn, PASP 57mmHg.  Marland Kitchen Dysphagia   . GERD (gastroesophageal reflux disease)   . History of kidney stones   . Hypertension    2002  . Hypertriglyceridemia   . Hypothyroidism   . PAD (peripheral artery disease) ([place])    a. 10/2017 ABI's R = 0.78, L = 0.88. Duplex showed borderline R CFA and SFA dzs->med rx.  Marland Kitchen PAF (paroxysmal atrial fibrillation) ([place])    a. Amio/Pradaxa (CHA2DS2VASc = 6).  Marland Kitchen PONV (postoperative nausea and vomiting)   . Right renal artery stenosis ([place])    a. 09/2017 Renal Artery Duplex: no significant LRA stenosis. RRA <60%.    Past Surgical History:  Procedure Laterality Date  . ABDOMINAL HYSTERECTOMY    . AMPUTATION Right 04/12/2014   Procedure: Revision AMPUTATION Right Long DIGIT;  Surgeon: Leanora Cover, MD;  Location: [place];  Service:  Orthopedics;  Laterality: Right;  . BALLOON DILATION  05/05/2011   Procedure: BALLOON DILATION;  Surgeon: Rogene Houston, MD;  [facility];  Service: Endoscopy;  Laterality: N/A;  . CARDIAC CATHETERIZATION     with stent 2004  CYPHER 2.75 x 66mm coronary stent in the mid left anterior descending stenotic lesion    Last stress test showed EF of 775  . CARDIOVERSION    . CATARACT EXTRACTION Left 2002  . CATARACT EXTRACTION W/PHACO Right 05/14/2013   Procedure: CATARACT EXTRACTION PHACO AND INTRAOCULAR LENS PLACEMENT RIGHT EYE;  Surgeon: Tonny Branch, MD;  Location: [facility];  Service: Ophthalmology;  Laterality: Right;  CDE:  20.45  . CERVICAL DISC SURGERY    . CHOLECYSTECTOMY    . CORONARY ANGIOPLASTY     1 stent  . ESOPHAGEAL DILATION N/A 04/25/2015   Procedure: ESOPHAGEAL DILATION;  Surgeon: Rogene Houston, MD;  [facility];  Service: Endoscopy;  Laterality: N/A;  . ESOPHAGOGASTRODUODENOSCOPY N/A 04/25/2015   Procedure: ESOPHAGOGASTRODUODENOSCOPY (EGD);  Surgeon: Rogene Houston, MD;  [facility];  Service: Endoscopy;  Laterality: N/A;  1225  . MALONEY DILATION  05/05/2011   Procedure: MALONEY DILATION;  Surgeon: Rogene Houston, MD;  [facility];  Service: Endoscopy;  Laterality: N/A;  . right knee arthroscopy  2010  . SAVORY DILATION  05/05/2011   Procedure:  SAVORY DILATION;  Surgeon: Rogene Houston, MD;  [facility];  Service: Endoscopy;  Laterality: N/A;  . SHOULDER SURGERY Right    torn rotator cuff  . TOENAIL EXCISION    . TOTAL ABDOMINAL HYSTERECTOMY W/ BILATERAL SALPINGOOPHORECTOMY       Allergies  Allergies  Allergen Reactions  . Ciprofloxacin Hcl Anaphylaxis  . Penicillins Anaphylaxis    Near death experience,  Has patient had a PCN reaction causing immediate rash, facial/tongue/throat swelling, SOB or lightheadedness with hypotension: Yes Has patient had a PCN reaction causing severe rash involving mucus  membranes or skin necrosis: No Has patient had a PCN reaction that required hospitalization: Yes Has patient had a PCN reaction occurring within the last 10 years: No If all of the above answers are "NO", then may proceed with Cephalosporin use.  . Amlodipine     Leg swelling  . Ceclor [Cefaclor] Nausea And Vomiting  . Codeine Nausea Only and Other (See Comments)    Cramps, nausea  . Lipitor [Atorvastatin] Other (See Comments)    Myalgias   . Sulfa Antibiotics Rash         History of Present Illness    [name] is a 77 y.o. female with a history of CAD s/p LAD stenting in 2004, HTN, HL, diast dysfxn, PAF, CKD III, hypothyroidism, obesity, claudication/PAD, and GERD.   Cardiac hx dates back to 2004 when she underwent LAD stenting (taxus des).  Cath in 2006 showed continued patency of LAD stent w/ otw nonobs dzs.  She underwent stress testing in 2014, which was nl.  More recently, she has been experiencing R>L lower ext pain.  ABI's performed in April showed an ABI of 0.78 on the right with duplex suggesting borderline R CFA and SFA dzs.  Smoking cessation and a walking program was advised.  She has not started a walking program.  From a cardiac standpoint, she says that she has been stable, though she does experience mild, intermittent exertional chest discomfort, generally about twice/week, assoc w/ dyspnea, lasting a few mins, and resolving spontaneously.  Ss do not typically limit activity.    She was in her USOH until this past Thursday, when she was outside and walking down some stairs, lost her balance (no presyncope/syncope), and fell, severely injuring her left lower leg.  She says she tore off a significant amount of tissue and was bleeding quite a bit.  She called EMS and was taken to [facility].  There are no notes available from this visit however, she was told there was no fracture.  She was discharged home and f/u with plastic surgery @ [facility] on Friday 6/14, @ which time she was  told that she'd likely require skin grafting.  On 6/16, she was walking into her bathroom and was preparing to sit on the toilet.  As she was squatting, she lost her balance, and says her legs became weak.  She fell to the floor, striking her right upper arm on something.  She was not having and chest pain, dyspnea, presyncope, or syncope.  B/c of recurrent fall and feeling weak, she presented to the [facility] ED.  Here, labs were notable for macrocytic anemia (9.0/26.4), creat of 2.98 (? Baseline), and troponin of 0.19.  ECG appears to show slow Afib, though baseline artifact was present.  New inferolateral TWI was also noted.  Pt was admitted for further eval.  Trop subsequently dropped to 0.12 this AM.  H/H lower @ 8.6/25.4.  No  chest pain or dyspnea.  Not on tele.  Inpatient Medications    . amiodarone  200 mg Oral Daily  . calcitRIOL  0.5 mcg Oral Daily  . cholecalciferol  2,000 Units Oral Daily  . dabigatran  75 mg Oral Q12H  . docusate sodium  100 mg Oral BID  . famotidine  20 mg Oral Daily  . feeding supplement  1 Container Oral TID BM  . fluticasone furoate-vilanterol  1 puff Inhalation Daily  . hydrALAZINE  50 mg Oral TID  . isosorbide mononitrate  60 mg Oral BID  . levothyroxine  125 mcg Oral QAC breakfast  . loratadine  10 mg Oral Daily  . multivitamin with minerals  1 tablet Oral Daily  . omega-3 acid ethyl esters  2 g Oral BID  . sodium chloride flush  10-40 mL Intracatheter Q12H  . vitamin B-12  1,000 mcg Oral BID  . vitamin C  1,000 mg Oral Daily    Family History    Family History  Problem Relation Age of Onset  . Diabetes Mother   . Heart failure Mother   . Heart attack Father   . Heart disease Sister   . Heart disease Brother   . Hypertension Sister   . Colon cancer Neg Hx    indicated that her mother is deceased. She indicated that her father is deceased. She reported the following about one of her sisters: One deceased from an MI, one deceased from pancreatic  cancer, rest are in fair health. She indicated that only one of her two brothers is alive. She indicated that her child is deceased. She indicated that the status of her neg hx is unknown.   Social History    Social History   Socioeconomic History  . Marital status: Widowed    Spouse name: Not on file  . Number of children: 1  . Years of education: Not on file  . Highest education level: Not on file  Occupational History    Employer: RETIRED  Social Needs  . Financial resource strain: Not on file  . Food insecurity:    Worry: Not on file    Inability: Not on file  . Transportation needs:    Medical: Not on file    Non-medical: Not on file  Tobacco Use  . Smoking status: Current Every Day Smoker    Packs/day: 1.00    Years: 56.00    Pack years: 56.00    Types: Cigarettes  . Smokeless tobacco: Never Used  Substance and Sexual Activity  . Alcohol use: No    Alcohol/week: 0.6 oz    Types: 1 Cans of beer per week  . Drug use: No  . Sexual activity: Never    Comment: one pack a day  Lifestyle  . Physical activity:    Days per week: Not on file    Minutes per session: Not on file  . Stress: Not on file  Relationships  . Social connections:    Talks on phone: Not on file    Gets together: Not on file    Attends religious service: Not on file    Active member of club or organization: Not on file    Attends meetings of clubs or organizations: Not on file    Relationship status: Not on file  . Intimate partner violence:    Fear of current or ex partner: Not on file    Emotionally abused: Not on file    Physically abused: Not  on file    Forced sexual activity: Not on file  Other Topics Concern  . Not on file  Social History Narrative   [place]  She does not routinely exercise.     Review of Systems    General:  No chills, fever, night sweats or weight changes.  Cardiovascular:  +++ intermittent exertional chest pain and  dyspnea on exertion as outlined above. no edema, orthopnea, palpitations, paroxysmal nocturnal dyspnea. Dermatological: No rash.  L lower leg wound w/ dsg in place currently - seeing plastics @ [facility]. Respiratory: No cough, +++ chronic dyspnea Urologic: No hematuria, dysuria Abdominal:   No nausea, vomiting, diarrhea, bright red blood per rectum, melena, or hematemesis Neurologic:  No visual changes, wkns, changes in mental status. All other systems reviewed and are otherwise negative except as noted above.  Physical Exam    Blood pressure (!) 138/53, pulse (!) 59, temperature 98 F (36.7 C), temperature source Oral, resp. rate 14, height 5\' 6"  (1.676 m), weight 211 lb 10.3 oz (96 kg), SpO2 95 %.  General: Pleasant, NAD Psych: flat affect. Neuro: Alert and oriented X 3. Moves all extremities spontaneously. HEENT: Normal  Neck: Supple without bruits or JVD. Lungs:  Resp regular and unlabored, rhonchi throughout w/ exp wheezing. Heart: IR, IR, distant, 2/6 syst murmur @ upper sternal borders. no s3, s4, or murmurs. Abdomen: Soft, obese, non-tender, non-distended, BS + x 4.  Extremities: No clubbing, cyanosis or edema. DP/PT/Radials 2+ and equal bilaterally. Left lower leg wrapped in gauze. D/I.  Labs     Recent Labs    12/18/17 2328 12/19/17 1138  TROPONINI 0.19* 0.12*   Lab Results  Component Value Date   WBC 6.4 12/19/2017   HGB 8.6 (L) 12/19/2017   HCT 25.4 (L) 12/19/2017   MCV 107.1 (H) 12/19/2017   PLT 188 12/19/2017    Recent Labs  Lab 12/19/17 1138  NA 139  K 4.0  CL 104  CO2 24  BUN 38*  CREATININE 2.31*  CALCIUM 9.0  GLUCOSE 97    Radiology Studies    Dg Lumbar Spine Complete  Result Date: 11/26/2017 CLINICAL DATA:  Low back and bilateral hip pain for the past 3-4 months. Multiple falls. EXAM: LUMBAR SPINE - COMPLETE 4+ VIEW COMPARISON:  08/09/2017 and lumbar spine MR dated 09/01/2017. FINDINGS: Five non-rib-bearing lumbar vertebrae. Stable mild  dextroconvex lumbar rotary scoliosis. Anterior and lateral spur formation at multiple levels of the lumbar and lower thoracic spine without significant change. Facet degenerative changes in the mid and lower lumbar spine. Stable old L1 20% superior endplate compression deformity with no acute fracture lines seen. Atheromatous arterial calcifications. IMPRESSION: Stable lumbar and lower thoracic spine degenerative changes and mild old L1 compression deformity. Electronically Signed   By: Claudie Revering M.D.   On: 11/26/2017 11:33   Dg Hips Bilat With Pelvis Min 5 Views  Result Date: 11/26/2017 CLINICAL DATA:  Low back and bilateral hip pain for the past 3-4 months. Multiple falls. EXAM: DG HIP (WITH OR WITHOUT PELVIS) 5+V BILAT COMPARISON:  None. FINDINGS: Diffuse osteopenia. Lower lumbar spine degenerative changes. Normal appearing hips without fracture or dislocation. Atheromatous arterial calcifications. IMPRESSION: 1. Normal appearing hips. 2. Lower lumbar spine degenerative changes. Electronically Signed   By: Claudie Revering M.D.   On: 11/26/2017 11:31   ECG & Cardiac Imaging    Bradycardia -  Possible Afib, 45, inflat ST dep/TWI - repeat pending  Assessment &  Plan    1.  Falls/weakness:  Pt with multiple falls over the past few months and two in the past week, in the setting of leg weakness and loss of balance.  She reported similar leg wkns when seen by Dr. Rockey Situ in January.  Prev seen by neurosurgery.  Lumbar films in May showed stable DDD.  She denies presyncope or syncope prior to or after falls.  Bradycardic on arrival though BP was stable @ 126/51 - f/u ecg pending as rhythm appeared to be afib. She has macrocytic anemia w/ H/H of 8.6/25.4 this AM.  Will need anemia w/u. PT seeing.  2.  CAD/Elevated troponin:  Prior h/o LAD DES in 2004 with patency documented on cath in 2006.  Neg MV in 2014.  She has had some exertional chest discomfort for some time now - generally occurring about twice a  week, assoc w/ dyspnea, lasting a few mins, and resolving spontaneously.  C/p does not typically limit activity, though activity is limited @ baseline.  Trop mildly elevated on arrival to hospital yesterday  0.19  0.12.  ECG w/ new inflat ST dep/TWI.  She did not have c/p prior to or after presentation.  Suspect demand ischemia in setting of fall.  Will check echo to eval EF.  If EF wnl, can plan on stress testing, as she will likely need cardiology clearance prior to potential left leg skin grafting/surgery in the near future.  She is not a cath candidate 2/2 renal dzs.  Not currently on ASA in the setting of chronic pradaxa therapy.   blocker held on admission in the setting of HRs in the 40's.  She is previously intolerant to statins.  3.  PAF:  S/p prior DCCV.  On amio and renal dosed pradaxa.  Admission ECG suggestive of slow Afib, though baseline artifact noted. She is irregular on exam.  Just placed on tele and rhythm is currently sinus with intermittent blocked PAC's followed by 1-2 jxnl beats with resumption of sinus rhythm.   blocker on hold in the setting of bradycardia on admission.  4.  RLE Claudication/PAD:  Recent w/u suggestive of mild R CFA/SFA dzs.  Good distal pulses.  Continued rec for tob cessation and walking program.  5. Macrocytic anemia: w/u per IM. ? Role in weakness.  6.  Essential HTN:  Stable on hydral/nitrate.  7.  HL: Prev intolerant to lipitor.  She did not want statin when last seen by Dr. Rockey Situ in January.  Encouraged to take Zetia @ that time, but has not been.  8.  CKD III:  Creat mildly elevated above presumed baseline on admission.  Lasix on hold.  9.  Tobacco Abuse:  Cessation advised.  She is not currently contemplating quitting.  Signed, Murray Hodgkins, NP 12/19/2017, 4:25 PM  For questions or updates, please contact   Please consult [email or web address] for contact info under Cardiology/STEMI.

## 2017-12-19 NOTE — H&P (Signed)
[name] is an 77 y.o. female.   Chief Complaint: Fall HPI: The patient with past medical history of CAD status post PCI, hypertension, COPD and hypothyroidism presents to the emergency department after a fall.  She was seen at [facility] 4 days ago for the same thing.  She sustained a skin tear to her left lower extremity at that time.  She admits to feeling weaker today.  She denies chest pain, shortness of breath, lightheadedness, palpitations or fever.  Laboratory evaluation revealed acute kidney injury as well as anemia which prompted the emergency department staff to call hospitalist service for admission  Past Medical History:  Diagnosis Date  . CAD (coronary artery disease)   . Carotid artery disease ([place])   . Cervical disc disease   . Chronic renal impairment, stage 3 (moderate) ([place]) 07/19/2016  . COPD (chronic obstructive pulmonary disease) ([place])   . Dysphagia   . Dysrhythmia    AFib  . GERD (gastroesophageal reflux disease)   . History of kidney stones   . Hypertension    2002  . Hypertriglyceridemia   . Hypothyroidism   . MI (myocardial infarction) ([place])    2003 with one stent placement  . PONV (postoperative nausea and vomiting)     Past Surgical History:  Procedure Laterality Date  . ABDOMINAL HYSTERECTOMY    . AMPUTATION Right 04/12/2014   Procedure: Revision AMPUTATION Right Long DIGIT;  Surgeon: Leanora Cover, MD;  Location: [place];  Service: Orthopedics;  Laterality: Right;  . BALLOON DILATION  05/05/2011   Procedure: BALLOON DILATION;  Surgeon: Rogene Houston, MD;  [facility];  Service: Endoscopy;  Laterality: N/A;  . CARDIAC CATHETERIZATION     with stent 2004  CYPHER 2.75 x 30m coronary stent in the mid left anterior descending stenotic lesion    Last stress test showed EF of 775  . CARDIOVERSION    . CATARACT EXTRACTION Left 2002  . CATARACT EXTRACTION W/PHACO Right 05/14/2013   Procedure: CATARACT EXTRACTION PHACO AND INTRAOCULAR LENS  PLACEMENT RIGHT EYE;  Surgeon: KTonny Branch MD;  Location: [facility];  Service: Ophthalmology;  Laterality: Right;  CDE:  20.45  . CERVICAL DISC SURGERY    . CHOLECYSTECTOMY    . CORONARY ANGIOPLASTY     1 stent  . ESOPHAGEAL DILATION N/A 04/25/2015   Procedure: ESOPHAGEAL DILATION;  Surgeon: NRogene Houston MD;  [facility];  Service: Endoscopy;  Laterality: N/A;  . ESOPHAGOGASTRODUODENOSCOPY N/A 04/25/2015   Procedure: ESOPHAGOGASTRODUODENOSCOPY (EGD);  Surgeon: NRogene Houston MD;  [facility];  Service: Endoscopy;  Laterality: N/A;  1225  . MALONEY DILATION  05/05/2011   Procedure: MALONEY DILATION;  Surgeon: NRogene Houston MD;  [facility];  Service: Endoscopy;  Laterality: N/A;  . right knee arthroscopy  2010  . SAVORY DILATION  05/05/2011   Procedure: SAVORY DILATION;  Surgeon: NRogene Houston MD;  [facility];  Service: Endoscopy;  Laterality: N/A;  . SHOULDER SURGERY Right    torn rotator cuff  . TOENAIL EXCISION    . TOTAL ABDOMINAL HYSTERECTOMY W/ BILATERAL SALPINGOOPHORECTOMY      Family History  Problem Relation Age of Onset  . Diabetes Mother   . Heart failure Mother   . Heart attack Father   . Heart disease Sister   . Heart disease Brother   . Hypertension Sister   . Colon cancer Neg Hx    Social History:  reports  that she has been smoking cigarettes.  She has a 56.00 pack-year smoking history. She has never used smokeless tobacco. She reports that she does not drink alcohol or use drugs.  Allergies:  Allergies  Allergen Reactions  . Ciprofloxacin Hcl Anaphylaxis  . Penicillins Anaphylaxis    Near death experience,  Has patient had a PCN reaction causing immediate rash, facial/tongue/throat swelling, SOB or lightheadedness with hypotension: Yes Has patient had a PCN reaction causing severe rash involving mucus membranes or skin necrosis: No Has patient had a PCN reaction that required hospitalization: Yes Has  patient had a PCN reaction occurring within the last 10 years: No If all of the above answers are "NO", then may proceed with Cephalosporin use.  . Amlodipine     Leg swelling  . Ceclor [Cefaclor] Nausea And Vomiting  . Codeine Nausea Only and Other (See Comments)    Cramps, nausea  . Lipitor [Atorvastatin] Other (See Comments)    Myalgias   . Sulfa Antibiotics Rash         Medications Prior to Admission  Medication Sig Dispense Refill  . Acetaminophen (TYLENOL PO) Take 500-1,000 mg by mouth every 6 (six) hours as needed (pain).     . ADVAIR DISKUS 500-50 MCG/DOSE AEPB 1 puff 2 (two) times daily.     Marland Kitchen albuterol (PROVENTIL) (2.5 MG/3ML) 0.083% nebulizer solution Take 2.5 mg by nebulization every 6 (six) hours as needed. For shortness of breath    . amiodarone (PACERONE) 200 MG tablet TAKE ONE (1) TABLET EACH DAY 90 tablet 3  . Ascorbic Acid (VITAMIN C) 1000 MG tablet Take 1,000 mg by mouth daily.    . calcitRIOL (ROCALTROL) 0.5 MCG capsule Take 0.5 mcg by mouth daily.    . Cetirizine HCl 10 MG CAPS Take 10 mg by mouth daily.    . cholecalciferol (VITAMIN D) 1000 units tablet Take 2,000 Units by mouth daily.    . clonazePAM (KLONOPIN) 1 MG tablet Take 0.5-1.5 mg by mouth 3 (three) times daily as needed for anxiety. Takes 0.83m three times a day and takes 1.511mat bedtime    . colchicine 0.6 MG tablet Take one tablet by mouth three times daily at the first sign of a gout attack.  Decrease to one tablet daily if gastric upset occurs. 60 tablet 1  . dabigatran (PRADAXA) 150 MG CAPS capsule Take 1 capsule (150 mg total) by mouth 2 (two) times daily. 180 capsule 3  . furosemide (LASIX) 40 MG tablet Take 40 mg by mouth 2 (two) times daily.    . hydrALAZINE (APRESOLINE) 50 MG tablet Take 1 tablet (50 mg total) by mouth 3 (three) times daily. 90 tablet 7  . isosorbide mononitrate (IMDUR) 60 MG 24 hr tablet Take 1 tablet (60 mg total) by mouth 2 (two) times daily. 60 tablet 3  . levothyroxine  (SYNTHROID, LEVOTHROID) 125 MCG tablet Take 125 mcg by mouth daily.    . nitroGLYCERIN (NITROLINGUAL) 0.4 MG/SPRAY spray Place 1 spray under the tongue every 5 (five) minutes x 3 doses as needed for chest pain. 12 g 3  . omega-3 acid ethyl esters (LOVAZA) 1 G capsule Take 2 g by mouth 2 (two) times daily.      . vitamin B-12 (CYANOCOBALAMIN) 1000 MCG tablet Take 1,000 mcg by mouth 2 (two) times daily.    . metoprolol tartrate (LOPRESSOR) 25 MG tablet Take 1 tablet (25 mg total) by mouth 2 (two) times daily. (Patient not taking: Reported on 12/19/2017) 180  tablet 3  . promethazine (PHENERGAN) 25 MG tablet Take 1 tablet (25 mg total) by mouth every 6 (six) hours as needed for nausea or vomiting. (Patient not taking: Reported on 12/19/2017) 30 tablet 3  . ranitidine (ZANTAC) 150 MG capsule Take 150 mg by mouth daily as needed for heartburn.      Results for orders placed or performed during the hospital encounter of 12/18/17 (from the past 48 hour(s))  Basic metabolic panel     Status: Abnormal   Collection Time: 12/18/17 11:28 PM  Result Value Ref Range   Sodium 139 135 - 145 mmol/L   Potassium 4.3 3.5 - 5.1 mmol/L   Chloride 104 101 - 111 mmol/L   CO2 25 22 - 32 mmol/L   Glucose, Bld 119 (H) 65 - 99 mg/dL   BUN 43 (H) 6 - 20 mg/dL   Creatinine, Ser 2.98 (H) 0.44 - 1.00 mg/dL   Calcium 9.1 8.9 - 10.3 mg/dL   GFR calc non Af Amer 14 (L) >60 mL/min   GFR calc Af Amer 16 (L) >60 mL/min    Comment: (NOTE) The eGFR has been calculated using the CKD EPI equation. This calculation has not been validated in all clinical situations. eGFR's persistently <60 mL/min signify possible Chronic Kidney Disease.    Anion gap 10 5 - 15    Comment: Performed at [facility], [address] [place], [place] [address]  CBC     Status: Abnormal   Collection Time: 12/18/17 11:28 PM  Result Value Ref Range   WBC 7.5 3.6 - 11.0 K/uL   RBC 2.47 (L) 3.80 - 5.20 MIL/uL   Hemoglobin 9.0 (L) 12.0 - 16.0  g/dL   HCT 26.4 (L) 35.0 - 47.0 %   MCV 106.7 (H) 80.0 - 100.0 fL   MCH 36.3 (H) 26.0 - 34.0 pg   MCHC 34.0 32.0 - 36.0 g/dL   RDW 16.4 (H) 11.5 - 14.5 %   Platelets 189 150 - 440 K/uL    Comment: Performed at [facility], [address] [place], [place] [address]  Troponin I     Status: Abnormal   Collection Time: 12/18/17 11:28 PM  Result Value Ref Range   Troponin I 0.19 (HH) <0.03 ng/mL    Comment: CRITICAL RESULT CALLED TO, READ BACK BY AND VERIFIED WITH MALKA KIMREY AT 5809 12/19/17.PMH Performed at [facility], [address] [place], [place] [address]   TSH     Status: Abnormal   Collection Time: 12/18/17 11:28 PM  Result Value Ref Range   TSH 8.987 (H) 0.350 - 4.500 uIU/mL    Comment: Performed by a 3rd Generation assay with a functional sensitivity of <=0.01 uIU/mL. Performed at [facility], [address] [place], [place] [address]   Urinalysis, Complete w Microscopic     Status: Abnormal   Collection Time: 12/18/17 11:40 PM  Result Value Ref Range   Color, Urine YELLOW (A) YELLOW   APPearance CLOUDY (A) CLEAR   Specific Gravity, Urine 1.013 1.005 - 1.030   pH 5.0 5.0 - 8.0   Glucose, UA NEGATIVE NEGATIVE mg/dL   Hgb urine dipstick NEGATIVE NEGATIVE   Bilirubin Urine NEGATIVE NEGATIVE   Ketones, ur NEGATIVE NEGATIVE mg/dL   Protein, ur NEGATIVE NEGATIVE mg/dL   Nitrite NEGATIVE NEGATIVE   Leukocytes, UA LARGE (A) NEGATIVE   RBC / HPF 6-10 0 - 5 RBC/hpf   WBC, UA 11-20 0 - 5 WBC/hpf  Bacteria, UA MANY (A) NONE SEEN   Squamous Epithelial / LPF 11-20 0 - 5   Mucus PRESENT    Hyaline Casts, UA PRESENT     Comment: Performed at [facility], [address] [place], [place] [address]   No results found.  Review of Systems  Constitutional: Negative for chills and fever.  HENT: Negative for sore throat and tinnitus.   Eyes: Negative for blurred vision and redness.  Respiratory: Negative for cough and shortness of  breath.   Cardiovascular: Negative for chest pain, palpitations, orthopnea and PND.  Gastrointestinal: Negative for abdominal pain, diarrhea, nausea and vomiting.  Genitourinary: Negative for dysuria, frequency and urgency.  Musculoskeletal: Negative for joint pain and myalgias.  Skin: Negative for rash.       No lesions  Neurological: Negative for speech change, focal weakness and weakness.  Endo/Heme/Allergies: Does not bruise/bleed easily.       No temperature intolerance  Psychiatric/Behavioral: Negative for depression and suicidal ideas.    Blood pressure (!) 122/44, pulse 60, temperature 98.3 F (36.8 C), temperature source Oral, resp. rate 14, height 5' 6" (1.676 m), weight 96 kg (211 lb 10.3 oz), SpO2 99 %. Physical Exam  Vitals reviewed. Constitutional: She is oriented to person, place, and time. She appears well-developed and well-nourished. No distress.  HENT:  Head: Normocephalic and atraumatic.  Mouth/Throat: Oropharynx is clear and moist.  Eyes: Pupils are equal, round, and reactive to light. Conjunctivae and EOM are normal. No scleral icterus.  Neck: Normal range of motion. Neck supple. No JVD present. No tracheal deviation present. No thyromegaly present.  Cardiovascular: Normal rate, regular rhythm and normal heart sounds. Exam reveals no gallop and no friction rub.  No murmur heard. Respiratory: Effort normal and breath sounds normal. No respiratory distress.  GI: Soft. Bowel sounds are normal. She exhibits no distension. There is no tenderness.  Genitourinary:  Genitourinary Comments: Deferred  Musculoskeletal: Normal range of motion. She exhibits edema.  Lymphadenopathy:    She has no cervical adenopathy.  Neurological: She is alert and oriented to person, place, and time. No cranial nerve deficit. She exhibits normal muscle tone.  Skin: Skin is warm and dry. No rash noted. No erythema.  Psychiatric: She has a normal mood and affect. Her behavior is normal.  Judgment and thought content normal.     Assessment/Plan This is a 77 year old female admitted for acute kidney injury. 1.  AKI: Avoid nephrotoxic agents.  Hydrate with intravenous fluid. 2.  Essential hypertension: Controlled; continue hydralazine 3.  CAD: Stable; continue Imdur and Lasix per home regimen 4.  Atrial fibrillation: Rate controlled; continue amiodarone and Pradaxa 5.  Hypothyroidism: Check TSH; continue Synthroid 6.  COPD: Stable; continue inhaled corticosteroid and long-acting bronchial agonist. 7.  DVT prophylaxis: As above 8.  GI prophylaxis: Pepcid The patient is a full code.  Time spent on admission orders and patient care approximately 45 minutes  Harrie Foreman, MD 12/19/2017, 7:36 AM

## 2017-12-19 NOTE — Plan of Care (Signed)
  Problem: Education: Goal: Knowledge of General Education information will improve Outcome: Progressing   Problem: Safety: Goal: Ability to remain free from injury will improve Outcome: Progressing

## 2017-12-20 ENCOUNTER — Inpatient Hospital Stay (HOSPITAL_COMMUNITY)
Admit: 2017-12-20 | Discharge: 2017-12-20 | Disposition: A | Discharge status: Home / Self Care | Payer: Medicare Other | Attending: Nurse Practitioner | Admitting: Nurse Practitioner

## 2017-12-20 DIAGNOSIS — I5030 Unspecified diastolic (congestive) heart failure: Secondary | ICD-10-CM | POA: Diagnosis not present

## 2017-12-20 LAB — URINE CULTURE: Culture: NO GROWTH

## 2017-12-20 LAB — BASIC METABOLIC PANEL
Anion gap: 8 (ref 5–15)
BUN: 35 mg/dL — ABNORMAL HIGH (ref 6–20)
CHLORIDE: 105 mmol/L (ref 101–111)
CO2: 26 mmol/L (ref 22–32)
CREATININE: 1.87 mg/dL — AB (ref 0.44–1.00)
Calcium: 8.8 mg/dL — ABNORMAL LOW (ref 8.9–10.3)
GFR calc Af Amer: 29 mL/min — ABNORMAL LOW (ref >60)
GFR, EST NON AFRICAN AMERICAN: 25 mL/min — AB (ref >60)
Glucose, Bld: 99 mg/dL (ref 65–99)
Potassium: 4 mmol/L (ref 3.5–5.1)
SODIUM: 139 mmol/L (ref 135–145)

## 2017-12-20 MED ORDER — IPRATROPIUM-ALBUTEROL 0.5-2.5 (3) MG/3ML IN SOLN
RESPIRATORY_TRACT | Status: AC
  Filled 2017-12-20: qty 3

## 2017-12-20 NOTE — Care Management CC44 (Signed)
Condition Code 44 Documentation Completed  Patient Details  Name: JELISA Forest MRN: [id number] Date of Birth: Nov 20, 1940   Condition Code 44 given:  Yes Patient signature on Condition Code 44 notice:  Yes Documentation of 2 MD's agreement:  Yes Code 44 added to claim:  Yes    Jolly Mango, RN 12/20/2017, 4:01 PM

## 2017-12-20 NOTE — [place] Notes (Signed)
Spoke with md re pts need for [facility] PT and Home 02.

## 2017-12-20 NOTE — Care Management Note (Addendum)
Case Management Note  Patient Details  Name: [name] MRN: [id number] Date of Birth: 09/10/1940  Subjective/Objective:  RNCM consult received from PT for home health PT and home O2. Per PT,  sats dropped down to 87% at rest. Patient has a history of COPD. She would benfit from nursing as well. Discussed POC with patient and her spouse and they are both agreeable. Offered a list of home care providers. Patient had no home health preference. Referral to [facility] for RN and PT. She has a walker. PCP is Dr. Karie Kirks in [place].                  Update: patient states she is going to [facility] Friday for wound care consult to leg wound  Action/Plan:   Expected Discharge Date:                  Expected Discharge Plan:  [place]  In-House Referral:     Discharge planning Services  CM Consult  Post Acute Care Choice:  Durable Medical Equipment, Home Health Choice offered to:  Patient  DME Arranged:  Oxygen DME Agency:  [place]:  RN, PT [facility] Agency:  [place]  Status of Service:  Completed, signed off  If discussed at [place] of Stay Meetings, dates discussed:    Additional Comments:  Jolly Mango, RN 12/20/2017, 3:08 PM

## 2017-12-20 NOTE — [place] Notes (Addendum)
Physical Therapy Evaluation Patient Details Name: [name] MRN: [id number] DOB: [birth year] Today's Date: 12/20/2017   History of Present Illness  The patient with past medical history of CAD status post PCI, hypertension, COPD and hypothyroidism presents to the emergency department after a fall.  She was seen at [facility] 4 days ago for the same thing.  She sustained a skin tear to her left lower extremity at that time.  She admits to feeling weaker today.  She denies chest pain, shortness of breath, lightheadedness, palpitations or fever.  Laboratory evaluation revealed acute kidney injury as well as anemia which prompted the emergency department staff to call hospitalist service for admission. She is currently admitted for UTI and AKI as well as fall.  Clinical Impression  Pt admitted with above diagnosis. Pt currently with functional limitations due to the deficits listed below (see PT Problem List).  Pt requires two attempts to come to standing with transfers as well as repeated cues for safe hand placement during push-off. Posterior LOB initially but improves with repeated attempts. Pt is able to complete a full lap around RN station with therapist. Gait speed is functional for full household mobility. She is generally safe with walker but does have one stumble requiring minA+1 assist from therapist. Vitals monitored and SaO2 drops to 83% on room air during exertion. It recovers to 87% with standing rest breaks. Pt denies DOE during desaturation. Supplemental O2 applied at 2L/min and SaO2 remains around 94% throughout the rest of her ambulation. Two standing rest breaks provided while pt is walking. Pt is safe to return home with support from her husband. She requires bilateral UE support to place her feet together while testing her balance. Positive Romberg. Single leg balance is less than 1 second bilaterally. History of repeated falls. She would benefit from OP PT to improve her strength  and balance. Pt will benefit from PT services to address deficits in strength, balance, and mobility in order to return to full function at home and decrease her risk for future falls.    SaO2 on room air at rest = 90% SaO2 on room air while ambulating = 83% SaO2 on 2 liters of O2 while ambulating = 94%       Follow Up Recommendations Outpatient PT;Other (comment)(OP PT for balance and strengthening)    Equipment Recommendations  None recommended by PT;Other (comment)(Pt must use her walker at discharge at all times)    Recommendations for Other Services       Precautions / Restrictions Precautions Precautions: Fall Restrictions Weight Bearing Restrictions: No      Mobility  Bed Mobility               General bed mobility comments: Received and left upright in chair  Transfers Overall transfer level: Needs assistance Equipment used: Rolling walker (2 wheeled) Transfers: Sit to/from Stand Sit to Stand: Min guard         General transfer comment: Pt requires 2 attempts to come to standing both times she performs transfers as well as repeated cues for safe hand placement during push-off. Posterior LOB initially but improves with repeated attempts  Ambulation/Gait Ambulation/Gait assistance: Min guard Gait Distance (Feet): 200 Feet Assistive device: Rolling walker (2 wheeled)   Gait velocity: Close to patient's baseline per subjective report   General Gait Details: Pt is able to complete a full lap around RN station with therapist. Gait speed is functional for full household mobility. She is generally safe with walker  but does have one stumble requiring mina+1 assist from therapist. Vitals monitored and SaO2 drops to 83% on room air during exertion. It recovers to 87% with standing rest breaks. Pt denies DOE during desaturation. Supplemental O2 applied at 2L/min and SaO2 remains around 94% throughout the rest of her ambulation. Two standing rest breaks provided while  pt is walking.   Stairs            Wheelchair Mobility    Modified Rankin (Stroke Patients Only)       Balance Overall balance assessment: Needs assistance Sitting-balance support: No upper extremity supported Sitting balance-Leahy Scale: Good     Standing balance support: No upper extremity supported Standing balance-Leahy Scale: Fair Standing balance comment: Requires bilateral UE support to place feet together. Positive Romberg. Single leg balance is less than 1 second bilaterally                             Pertinent Vitals/Pain Pain Assessment: No/denies pain    Home Living Family/patient expects to be discharged to:: Private residence Living Arrangements: Spouse/significant other Available Help at Discharge: Family Type of Home: House Home Access: Stairs to enter Entrance Stairs-Rails: Can reach both Entrance Stairs-Number of Steps: 4 Home Layout: One level Home Equipment: [place] - 2 wheels;Cane - single point;Bedside commode;Wheelchair - manual      Prior Function Level of Independence: Independent         Comments: Pt reports independence with ADLs/IADLs. She was previously ambulating without an assistive device but was furnished with a walker recently which she has used. She has sufferred at least 5-6 falls inthe last 12 months, 3 of which occurred in the last week     Hand Dominance   Dominant Hand: Right    Extremity/Trunk Assessment   Upper Extremity Assessment Upper Extremity Assessment: Overall WFL for tasks assessed    Lower Extremity Assessment Lower Extremity Assessment: Generalized weakness       Communication   Communication: No difficulties  Cognition Arousal/Alertness: Awake/alert Behavior During Therapy: WFL for tasks assessed/performed Overall Cognitive Status: Within Functional Limits for tasks assessed(AOx2, oriented to year but not month)                                        General  Comments      Exercises     Assessment/Plan    PT Assessment Patient needs continued PT services  PT Problem List Decreased strength;Decreased activity tolerance;Decreased mobility;Decreased balance;Decreased safety awareness       PT Treatment Interventions DME instruction;Gait training;Functional mobility training;Therapeutic activities;Therapeutic exercise;Balance training;Patient/family education    PT Goals (Current goals can be found in the Care Plan section)  Acute Rehab PT Goals Patient Stated Goal: Return to prior function and decrease her falls PT Goal Formulation: With patient/family Time For Goal Achievement: 01/03/18 Potential to Achieve Goals: Fair    Frequency Min 2X/week   Barriers to discharge        Co-evaluation               AM-PAC PT "6 Clicks" Daily Activity  Outcome Measure Difficulty turning over in bed (including adjusting bedclothes, sheets and blankets)?: None Difficulty moving from lying on back to sitting on the side of the bed? : None Difficulty sitting down on and standing up from a chair with arms (e.g., wheelchair, bedside  commode, etc,.)?: A Little Help needed moving to and from a bed to chair (including a wheelchair)?: A Little Help needed walking in hospital room?: A Little Help needed climbing 3-5 steps with a railing? : A Little 6 Click Score: 20    End of Session Equipment Utilized During Treatment: Gait belt Activity Tolerance: Patient tolerated treatment well Patient left: in chair;with call bell/phone within reach;with family/visitor present Nurse Communication: Mobility status;Other (comment)(SaO2 readings provided to RN and Case Manager) PT Visit Diagnosis: Unsteadiness on feet (R26.81);Muscle weakness (generalized) (M62.81);History of falling (Z91.81);Difficulty in walking, not elsewhere classified (R26.2)    Time: 2761-8485 PT Time Calculation (min) (ACUTE ONLY): 28 min   Charges:   PT Evaluation $PT Eval Low  Complexity: 1 Low PT Treatments $Gait Training: 8-22 mins   PT G Codes:        Lyndel Safe Huprich PT, DPT    Huprich,Jason 12/20/2017, 3:19 PM

## 2017-12-20 NOTE — Care Management Obs Status (Signed)
[place] NOTIFICATION   Patient Details  Name: [name] MRN: [id number] Date of Birth: [birth year]   Medicare Observation Status Notification Given:  Yes    Jolly Mango, RN 12/20/2017, 4:01 PM

## 2017-12-20 NOTE — [place] Notes (Addendum)
SATURATION QUALIFICATIONS: (This note is used to comply with regulatory documentation for home oxygen)  Patient Saturations on Room Air at Rest = 87%  Patient Saturations on Room Air while Ambulating = %  Patient Saturations on  Liters of oxygen while Ambulating = %  Please briefly explain why patient needs home oxygen: hypoxia

## 2017-12-20 NOTE — [place] Notes (Signed)
*  PRELIMINARY RESULTS* Echocardiogram 2D Echocardiogram has been performed.  [name] 12/20/2017, 11:48 AM

## 2017-12-21 ENCOUNTER — Ambulatory Visit: Payer: Medicare Other | Admitting: Internal Medicine

## 2017-12-21 ENCOUNTER — Telehealth: Payer: Self-pay | Admitting: Cardiovascular Disease

## 2017-12-21 NOTE — Telephone Encounter (Signed)
To Dr. Arley Phenix, NP to review.  I don't see a discharge summary on her yet. I'm not sure if pradaxa was on hold due to her wounds. Please advise.

## 2017-12-21 NOTE — Telephone Encounter (Signed)
Patient was recently dc from [place] and wants to know if she should or should not be taking her blood thinner pradaxa

## 2017-12-21 NOTE — Telephone Encounter (Signed)
She was very anemic when in the hospital, hematocrit 25 I'm surprised more workup was not done for anemia Would probably hold the anticoagulation for now given her low blood count This could be restarted once blood count improves, hemoglobin of 10 She may want to see primary care as well for anemia workup, may need stool guaiacs, iron studies etc. As mentioned there is no discharge summary from hospitalist

## 2017-12-22 NOTE — Telephone Encounter (Signed)
Spoke with patient and reviewed recommendations by Dr. Rockey Situ. Instructed her to hold pradaxa for now. She verbalized understanding with no further questions at this time.

## 2017-12-22 NOTE — Discharge Summary (Addendum)
[place] [facility] NAME: [name]    MR#:  [id number]  DATE OF BIRTH:  [birth year]  DATE OF ADMISSION:  12/18/2017 ADMITTING PHYSICIAN: Harrie Foreman, MD  DATE OF DISCHARGE: 12/20/2017  4:52 PM  PRIMARY CARE PHYSICIAN: Lemmie Evens, MD   ADMISSION DIAGNOSIS:  Acute kidney injury  fall and gait instability Coronary artery disease Chronic atrial fibrillation COPD Hypothyroidism   DISCHARGE DIAGNOSIS:  Active Problems:   AKI (acute kidney injury) ([place]) Elevated troponin  Coronary artery disease Emphysema Fall and gait instability  SECONDARY DIAGNOSIS:   Past Medical History:  Diagnosis Date  . CAD (coronary artery disease)    a. 10/2002 PCI: LAD 52m (2.75x23 Cypher DES); b. 09/2004 Cath: LM nl, LAD 40p, patent stent, D1 30p, LCX 20p, diff dzs throughout, OM1 50p, OM2 60p, OM3 40p, RCA 40p-->Med Rx; c. 10/2012 MV: No ischemia/infarct.  . Carotid artery disease ([place])   . Cervical disc disease   . CKD (chronic kidney disease), stage III ([place])   . COPD (chronic obstructive pulmonary disease) ([place])   . Diastolic dysfunction    a. 03/2017 Echo: Ef 60-65%, Gr2 DD, mild MR, mildly dil LA/RA, mildly to mod dil RV w/ nl fxn, PASP 52mmHg.  Marland Kitchen Dysphagia   . GERD (gastroesophageal reflux disease)   . History of kidney stones   . Hypertension    2002  . Hypertriglyceridemia   . Hypothyroidism   . PAD (peripheral artery disease) ([place])    a. 10/2017 ABI's R = 0.78, L = 0.88. Duplex showed borderline R CFA and SFA dzs->med rx.  Marland Kitchen PAF (paroxysmal atrial fibrillation) ([place])    a. Amio/Pradaxa (CHA2DS2VASc = 6).  Marland Kitchen PONV (postoperative nausea and vomiting)   . Right renal artery stenosis ([place])    a. 09/2017 Renal Artery Duplex: no significant LRA stenosis. RRA <60%.     ADMITTING HISTORY The patient with past medical history of CAD status post PCI, hypertension, COPD and hypothyroidism presents to the emergency department after a fall.   She was seen at [facility] 4 days ago for the same thing.  She sustained a skin tear to her left lower extremity at that time.  She admits to feeling weaker today.  She denies chest pain, shortness of breath, lightheadedness, palpitations or fever.  Laboratory evaluation revealed acute kidney injury as well as anemia which prompted the emergency department staff to call hospitalist service for admission  HOSPITAL COURSE:  Patient admitted to telemetry.  Diuretics were held and she was started on IV fluids.  Her renal function improved.  During the evaluation in the hospital patient's troponin was elevated.  She was seen by cardiology.  Elevated troponin secondary to demand ischemia, Not a candidate for cardiac cath and intervention.  Patient received physical therapy.  She will be discharged home with home physical therapy and home health services.  She will continue her anticoagulation with oral Pradaxa and continue oral amiodarone.  Worked up with echocardiogram which shows EF of 65 to 70% with left ventricle hypertrophy.  Patient hemodynamically stable will be discharged home.  CONSULTS OBTAINED:    DRUG ALLERGIES:   Allergies  Allergen Reactions  . Ciprofloxacin Hcl Anaphylaxis  . Penicillins Anaphylaxis    Near death experience,  Has patient had a PCN reaction causing immediate rash, facial/tongue/throat swelling, SOB or lightheadedness with hypotension: Yes Has patient had a PCN reaction causing severe rash involving mucus membranes or skin necrosis: No Has patient  had a PCN reaction that required hospitalization: Yes Has patient had a PCN reaction occurring within the last 10 years: No If all of the above answers are "NO", then may proceed with Cephalosporin use.  . Amlodipine     Leg swelling  . Ceclor [Cefaclor] Nausea And Vomiting  . Codeine Nausea Only and Other (See Comments)    Cramps, nausea  . Lipitor [Atorvastatin] Other (See Comments)    Myalgias   . Sulfa Antibiotics  Rash         DISCHARGE MEDICATIONS:   Allergies as of 12/20/2017      Reactions   Ciprofloxacin Hcl Anaphylaxis   Penicillins Anaphylaxis   Near death experience,  Has patient had a PCN reaction causing immediate rash, facial/tongue/throat swelling, SOB or lightheadedness with hypotension: Yes Has patient had a PCN reaction causing severe rash involving mucus membranes or skin necrosis: No Has patient had a PCN reaction that required hospitalization: Yes Has patient had a PCN reaction occurring within the last 10 years: No If all of the above answers are "NO", then may proceed with Cephalosporin use.   Amlodipine    Leg swelling   Ceclor [cefaclor] Nausea And Vomiting   Codeine Nausea Only, Other (See Comments)   Cramps, nausea   Lipitor [atorvastatin] Other (See Comments)   Myalgias   Sulfa Antibiotics Rash          Medication List    STOP taking these medications   metoprolol tartrate 25 MG tablet Commonly known as:  LOPRESSOR   promethazine 25 MG tablet Commonly known as:  PHENERGAN     TAKE these medications   ADVAIR DISKUS 500-50 MCG/DOSE Aepb Generic drug:  Fluticasone-Salmeterol 1 puff 2 (two) times daily.   albuterol (2.5 MG/3ML) 0.083% nebulizer solution Commonly known as:  PROVENTIL Take 2.5 mg by nebulization every 6 (six) hours as needed. For shortness of breath   amiodarone 200 MG tablet Commonly known as:  PACERONE TAKE ONE (1) TABLET EACH DAY   calcitRIOL 0.5 MCG capsule Commonly known as:  ROCALTROL Take 0.5 mcg by mouth daily.   Cetirizine HCl 10 MG Caps Take 10 mg by mouth daily.   cholecalciferol 1000 units tablet Commonly known as:  VITAMIN D Take 2,000 Units by mouth daily.   clonazePAM 1 MG tablet Commonly known as:  KLONOPIN Take 0.5-1.5 mg by mouth 3 (three) times daily as needed for anxiety. Takes 0.5mg  three times a day and takes 1.5mg  at bedtime   colchicine 0.6 MG tablet Take one tablet by mouth three times daily at the  first sign of a gout attack.  Decrease to one tablet daily if gastric upset occurs.   dabigatran 150 MG Caps capsule Commonly known as:  PRADAXA Take 1 capsule (150 mg total) by mouth 2 (two) times daily.   furosemide 40 MG tablet Commonly known as:  LASIX Take 40 mg by mouth 2 (two) times daily.   hydrALAZINE 50 MG tablet Commonly known as:  APRESOLINE Take 1 tablet (50 mg total) by mouth 3 (three) times daily.   isosorbide mononitrate 60 MG 24 hr tablet Commonly known as:  IMDUR Take 1 tablet (60 mg total) by mouth 2 (two) times daily.   levothyroxine 125 MCG tablet Commonly known as:  SYNTHROID, LEVOTHROID Take 125 mcg by mouth daily.   nitroGLYCERIN 0.4 MG/SPRAY spray Commonly known as:  NITROLINGUAL Place 1 spray under the tongue every 5 (five) minutes x 3 doses as needed for chest pain.  omega-3 acid ethyl esters 1 g capsule Commonly known as:  LOVAZA Take 2 g by mouth 2 (two) times daily.   ranitidine 150 MG capsule Commonly known as:  ZANTAC Take 150 mg by mouth daily as needed for heartburn.   TYLENOL PO Take 500-1,000 mg by mouth every 6 (six) hours as needed (pain).   vitamin B-12 1000 MCG tablet Commonly known as:  CYANOCOBALAMIN Take 1,000 mcg by mouth 2 (two) times daily.   vitamin C 1000 MG tablet Take 1,000 mg by mouth daily.       Today  Patient seen and evaluated in the day of discharge No shortness of breath No chest pain No abdominal pain Tolerating diet well  VITAL SIGNS:  Blood pressure (!) 123/40, pulse 70, temperature 98.6 F (37 C), resp. rate 20, height 5\' 6"  (1.676 m), weight 96.1 kg (211 lb 12.8 oz), SpO2 90 %.  I/O:  No intake or output data in the 24 hours ending 12/22/17 1604  PHYSICAL EXAMINATION:  Physical Exam  GENERAL:  77 y.o.-year-old patient lying in the bed with no acute distress.  LUNGS: Normal breath sounds bilaterally, no wheezing, rales,rhonchi or crepitation. No use of accessory muscles of respiration.   CARDIOVASCULAR: S1, S2 normal. No murmurs, rubs, or gallops.  ABDOMEN: Soft, non-tender, non-distended. Bowel sounds present. No organomegaly or mass.  NEUROLOGIC: Moves all 4 extremities. PSYCHIATRIC: The patient is alert and oriented x 3.  SKIN: No obvious rash, lesion, or ulcer.   DATA REVIEW:   CBC Recent Labs  Lab 12/19/17 1138  WBC 6.4  HGB 8.6*  HCT 25.4*  PLT 188    Chemistries  Recent Labs  Lab 12/20/17 0409  NA 139  K 4.0  CL 105  CO2 26  GLUCOSE 99  BUN 35*  CREATININE 1.87*  CALCIUM 8.8*    Cardiac Enzymes Recent Labs  Lab 12/19/17 2258  TROPONINI 0.07*    Microbiology Results  Results for orders placed or performed during the hospital encounter of 12/18/17  Urine Culture     Status: None   Collection Time: 12/18/17 11:40 PM  Result Value Ref Range Status   Specimen Description   Final    URINE, RANDOM Performed at [facility], [address], [place], [place] [address]    Special Requests   Final    NONE Performed at [facility], [address] [place], [place] [address]    Culture   Final    NO GROWTH Performed at [facility], [address] [place], [place] [address]    Report Status 12/20/2017 FINAL  Final    RADIOLOGY:  No results found.  Follow up with PCP in 1 week.  Management plans discussed with the patient, family and they are in agreement.  CODE STATUS: Full code Code Status History    Date Active Date Inactive Code Status Order ID Comments User Context   12/19/2017 0332 12/20/2017 1958 Full Code [id number]  Harrie Foreman, MD Inpatient    Advance Directive Documentation     Most Recent Value  Type of Advance Directive  Healthcare Power of Attorney, Living will  Pre-existing out of facility DNR order (yellow form or pink MOST form)  -  "MOST" Form in Place?  -      TOTAL TIME TAKING CARE OF THIS PATIENT ON DAY OF DISCHARGE: more than 34 minutes.   Saundra Shelling M.D on 12/22/2017  at 4:04 PM  Between 7am to 6pm -  Pager - [phone]  After 6pm go to [email or web address] - password [email or web address] [facility]  Office  [phone]  CC: Primary care physician; Lemmie Evens, MD  Note: This dictation was prepared with Dragon dictation along with smaller phrase technology. Any transcriptional errors that result from this process are unintentional.

## 2017-12-29 ENCOUNTER — Other Ambulatory Visit: Payer: Self-pay | Admitting: Cardiovascular Disease

## 2018-01-02 ENCOUNTER — Telehealth: Payer: Self-pay | Admitting: Cardiovascular Disease

## 2018-01-02 ENCOUNTER — Other Ambulatory Visit: Payer: Self-pay

## 2018-01-02 ENCOUNTER — Emergency Department: Payer: Medicare Other

## 2018-01-02 ENCOUNTER — Inpatient Hospital Stay
Admission: EM | Admit: 2018-01-02 | Discharge: 2018-01-10 | DRG: 579 | Discharge status: Against Medical Advice | Payer: Medicare Other | Attending: Internal Medicine | Admitting: Internal Medicine

## 2018-01-02 ENCOUNTER — Inpatient Hospital Stay: Payer: Medicare Other

## 2018-01-02 DIAGNOSIS — R1310 Dysphagia, unspecified: Secondary | ICD-10-CM | POA: Diagnosis present

## 2018-01-02 DIAGNOSIS — Z90710 Acquired absence of both cervix and uterus: Secondary | ICD-10-CM

## 2018-01-02 DIAGNOSIS — Z9842 Cataract extraction status, left eye: Secondary | ICD-10-CM | POA: Diagnosis not present

## 2018-01-02 DIAGNOSIS — R001 Bradycardia, unspecified: Secondary | ICD-10-CM | POA: Diagnosis present

## 2018-01-02 DIAGNOSIS — E875 Hyperkalemia: Secondary | ICD-10-CM | POA: Diagnosis not present

## 2018-01-02 DIAGNOSIS — L03116 Cellulitis of left lower limb: Secondary | ICD-10-CM | POA: Diagnosis present

## 2018-01-02 DIAGNOSIS — Z87442 Personal history of urinary calculi: Secondary | ICD-10-CM | POA: Diagnosis not present

## 2018-01-02 DIAGNOSIS — F17210 Nicotine dependence, cigarettes, uncomplicated: Secondary | ICD-10-CM | POA: Diagnosis present

## 2018-01-02 DIAGNOSIS — N189 Chronic kidney disease, unspecified: Secondary | ICD-10-CM

## 2018-01-02 DIAGNOSIS — Z880 Allergy status to penicillin: Secondary | ICD-10-CM

## 2018-01-02 DIAGNOSIS — E781 Pure hyperglyceridemia: Secondary | ICD-10-CM | POA: Diagnosis present

## 2018-01-02 DIAGNOSIS — I5033 Acute on chronic diastolic (congestive) heart failure: Secondary | ICD-10-CM | POA: Diagnosis present

## 2018-01-02 DIAGNOSIS — N179 Acute kidney failure, unspecified: Secondary | ICD-10-CM | POA: Diagnosis present

## 2018-01-02 DIAGNOSIS — L089 Local infection of the skin and subcutaneous tissue, unspecified: Secondary | ICD-10-CM

## 2018-01-02 DIAGNOSIS — I739 Peripheral vascular disease, unspecified: Secondary | ICD-10-CM | POA: Diagnosis present

## 2018-01-02 DIAGNOSIS — T148XXA Other injury of unspecified body region, initial encounter: Secondary | ICD-10-CM

## 2018-01-02 DIAGNOSIS — I130 Hypertensive heart and chronic kidney disease with heart failure and stage 1 through stage 4 chronic kidney disease, or unspecified chronic kidney disease: Secondary | ICD-10-CM | POA: Diagnosis present

## 2018-01-02 DIAGNOSIS — Z9981 Dependence on supplemental oxygen: Secondary | ICD-10-CM

## 2018-01-02 DIAGNOSIS — Z882 Allergy status to sulfonamides status: Secondary | ICD-10-CM

## 2018-01-02 DIAGNOSIS — N184 Chronic kidney disease, stage 4 (severe): Secondary | ICD-10-CM | POA: Diagnosis present

## 2018-01-02 DIAGNOSIS — I480 Paroxysmal atrial fibrillation: Secondary | ICD-10-CM | POA: Diagnosis present

## 2018-01-02 DIAGNOSIS — D649 Anemia, unspecified: Secondary | ICD-10-CM | POA: Diagnosis not present

## 2018-01-02 DIAGNOSIS — Z961 Presence of intraocular lens: Secondary | ICD-10-CM | POA: Diagnosis present

## 2018-01-02 DIAGNOSIS — E039 Hypothyroidism, unspecified: Secondary | ICD-10-CM | POA: Diagnosis present

## 2018-01-02 DIAGNOSIS — E785 Hyperlipidemia, unspecified: Secondary | ICD-10-CM | POA: Diagnosis present

## 2018-01-02 DIAGNOSIS — D638 Anemia in other chronic diseases classified elsewhere: Secondary | ICD-10-CM | POA: Diagnosis present

## 2018-01-02 DIAGNOSIS — J811 Chronic pulmonary edema: Secondary | ICD-10-CM | POA: Diagnosis not present

## 2018-01-02 DIAGNOSIS — I2510 Atherosclerotic heart disease of native coronary artery without angina pectoris: Secondary | ICD-10-CM | POA: Diagnosis present

## 2018-01-02 DIAGNOSIS — J441 Chronic obstructive pulmonary disease with (acute) exacerbation: Secondary | ICD-10-CM | POA: Diagnosis not present

## 2018-01-02 DIAGNOSIS — Z6841 Body Mass Index (BMI) 40.0 and over, adult: Secondary | ICD-10-CM | POA: Diagnosis not present

## 2018-01-02 DIAGNOSIS — Z888 Allergy status to other drugs, medicaments and biological substances status: Secondary | ICD-10-CM

## 2018-01-02 DIAGNOSIS — K219 Gastro-esophageal reflux disease without esophagitis: Secondary | ICD-10-CM | POA: Diagnosis present

## 2018-01-02 DIAGNOSIS — L97309 Non-pressure chronic ulcer of unspecified ankle with unspecified severity: Secondary | ICD-10-CM | POA: Diagnosis not present

## 2018-01-02 DIAGNOSIS — J449 Chronic obstructive pulmonary disease, unspecified: Secondary | ICD-10-CM | POA: Diagnosis not present

## 2018-01-02 DIAGNOSIS — N170 Acute kidney failure with tubular necrosis: Secondary | ICD-10-CM | POA: Diagnosis not present

## 2018-01-02 DIAGNOSIS — Z823 Family history of stroke: Secondary | ICD-10-CM

## 2018-01-02 DIAGNOSIS — R0602 Shortness of breath: Secondary | ICD-10-CM | POA: Diagnosis present

## 2018-01-02 DIAGNOSIS — Z9841 Cataract extraction status, right eye: Secondary | ICD-10-CM

## 2018-01-02 DIAGNOSIS — Z800 Family history of malignant neoplasm of digestive organs: Secondary | ICD-10-CM

## 2018-01-02 DIAGNOSIS — J9611 Chronic respiratory failure with hypoxia: Secondary | ICD-10-CM | POA: Diagnosis present

## 2018-01-02 DIAGNOSIS — R600 Localized edema: Secondary | ICD-10-CM | POA: Diagnosis not present

## 2018-01-02 DIAGNOSIS — Z8249 Family history of ischemic heart disease and other diseases of the circulatory system: Secondary | ICD-10-CM

## 2018-01-02 DIAGNOSIS — Z955 Presence of coronary angioplasty implant and graft: Secondary | ICD-10-CM | POA: Diagnosis not present

## 2018-01-02 DIAGNOSIS — Z833 Family history of diabetes mellitus: Secondary | ICD-10-CM

## 2018-01-02 DIAGNOSIS — Z79890 Hormone replacement therapy (postmenopausal): Secondary | ICD-10-CM

## 2018-01-02 DIAGNOSIS — Z885 Allergy status to narcotic agent status: Secondary | ICD-10-CM

## 2018-01-02 DIAGNOSIS — Z881 Allergy status to other antibiotic agents status: Secondary | ICD-10-CM

## 2018-01-02 LAB — CBC WITH DIFFERENTIAL/PLATELET
BASOS ABS: 0.1 10*3/uL (ref 0–0.1)
BASOS PCT: 1 %
EOS ABS: 0.1 10*3/uL (ref 0–0.7)
EOS PCT: 1 %
HCT: 25.6 % — ABNORMAL LOW (ref 35.0–47.0)
Hemoglobin: 8.5 g/dL — ABNORMAL LOW (ref 12.0–16.0)
Lymphocytes Relative: 17 %
Lymphs Abs: 0.9 10*3/uL — ABNORMAL LOW (ref 1.0–3.6)
MCH: 35.1 pg — ABNORMAL HIGH (ref 26.0–34.0)
MCHC: 33.2 g/dL (ref 32.0–36.0)
MCV: 106 fL — ABNORMAL HIGH (ref 80.0–100.0)
MONO ABS: 0.5 10*3/uL (ref 0.2–0.9)
Monocytes Relative: 11 %
Neutro Abs: 3.5 10*3/uL (ref 1.4–6.5)
Neutrophils Relative %: 70 %
PLATELETS: 305 10*3/uL (ref 150–440)
RBC: 2.42 MIL/uL — ABNORMAL LOW (ref 3.80–5.20)
RDW: 16.1 % — AB (ref 11.5–14.5)
WBC: 4.9 10*3/uL (ref 3.6–11.0)

## 2018-01-02 LAB — COMPREHENSIVE METABOLIC PANEL
ALBUMIN: 3.3 g/dL — AB (ref 3.5–5.0)
ALT: 22 U/L (ref 0–44)
ANION GAP: 8 (ref 5–15)
AST: 33 U/L (ref 15–41)
Alkaline Phosphatase: 82 U/L (ref 38–126)
BUN: 54 mg/dL — AB (ref 8–23)
CALCIUM: 8.7 mg/dL — AB (ref 8.9–10.3)
CHLORIDE: 97 mmol/L — AB (ref 98–111)
CO2: 26 mmol/L (ref 22–32)
Creatinine, Ser: 2.94 mg/dL — ABNORMAL HIGH (ref 0.44–1.00)
GFR calc Af Amer: 17 mL/min — ABNORMAL LOW (ref >60)
GFR calc non Af Amer: 14 mL/min — ABNORMAL LOW (ref >60)
GLUCOSE: 111 mg/dL — AB (ref 70–99)
POTASSIUM: 4.8 mmol/L (ref 3.5–5.1)
SODIUM: 131 mmol/L — AB (ref 135–145)
TOTAL PROTEIN: 6.4 g/dL — AB (ref 6.5–8.1)
Total Bilirubin: 0.8 mg/dL (ref 0.3–1.2)

## 2018-01-02 LAB — IRON AND TIBC
Iron: 16 ug/dL — ABNORMAL LOW (ref 28–170)
Saturation Ratios: 4 % — ABNORMAL LOW (ref 10.4–31.8)
TIBC: 410 ug/dL (ref 250–450)
UIBC: 394 ug/dL

## 2018-01-02 LAB — RETICULOCYTES
RBC.: 2.37 MIL/uL — ABNORMAL LOW (ref 3.80–5.20)
RETIC COUNT ABSOLUTE: 66.4 10*3/uL (ref 19.0–183.0)
Retic Ct Pct: 2.8 % (ref 0.4–3.1)

## 2018-01-02 LAB — FOLATE: FOLATE: 13.4 ng/mL (ref >5.9)

## 2018-01-02 LAB — FERRITIN: Ferritin: 53 ng/mL (ref 11–307)

## 2018-01-02 LAB — TROPONIN I: TROPONIN I: 0.03 ng/mL — AB (ref <0.03)

## 2018-01-02 MED ORDER — VITAMIN C 500 MG PO TABS
1000.0000 mg | ORAL_TABLET | Freq: Every day | ORAL | Status: DC
  Administered 2018-01-03 – 2018-01-10 (×8): 1000 mg via ORAL
  Filled 2018-01-02 (×8): qty 2

## 2018-01-02 MED ORDER — FAMOTIDINE 20 MG PO TABS
20.0000 mg | ORAL_TABLET | Freq: Every day | ORAL | Status: DC
  Administered 2018-01-02 – 2018-01-08 (×8): 20 mg via ORAL
  Filled 2018-01-02 (×9): qty 1

## 2018-01-02 MED ORDER — VANCOMYCIN HCL IN DEXTROSE 1-5 GM/200ML-% IV SOLN
1000.0000 mg | Freq: Once | INTRAVENOUS | Status: AC
  Administered 2018-01-02: 1000 mg via INTRAVENOUS
  Filled 2018-01-02: qty 200

## 2018-01-02 MED ORDER — SODIUM CHLORIDE 0.9 % IV SOLN
500.0000 mg | INTRAVENOUS | Status: AC
  Administered 2018-01-02: 500 mg via INTRAVENOUS
  Filled 2018-01-02: qty 500

## 2018-01-02 MED ORDER — SODIUM CHLORIDE 0.9 % IV SOLN
500.0000 mg | Freq: Two times a day (BID) | INTRAVENOUS | Status: DC
  Administered 2018-01-03: 500 mg via INTRAVENOUS
  Filled 2018-01-02: qty 500

## 2018-01-02 MED ORDER — CLONAZEPAM 0.5 MG PO TABS
0.5000 mg | ORAL_TABLET | Freq: Three times a day (TID) | ORAL | Status: DC
Start: 2018-01-02 — End: 2018-01-10
  Administered 2018-01-02 – 2018-01-10 (×19): 0.5 mg via ORAL
  Filled 2018-01-02 (×20): qty 1

## 2018-01-02 MED ORDER — IPRATROPIUM-ALBUTEROL 0.5-2.5 (3) MG/3ML IN SOLN
3.0000 mL | Freq: Once | RESPIRATORY_TRACT | Status: AC
  Administered 2018-01-02: 3 mL via RESPIRATORY_TRACT

## 2018-01-02 MED ORDER — VITAMIN D3 25 MCG (1000 UNIT) PO TABS
2000.0000 [IU] | ORAL_TABLET | Freq: Every day | ORAL | Status: DC
  Administered 2018-01-03 – 2018-01-10 (×8): 2000 [IU] via ORAL
  Filled 2018-01-02 (×8): qty 2

## 2018-01-02 MED ORDER — VITAMIN B-12 1000 MCG PO TABS
1000.0000 ug | ORAL_TABLET | Freq: Two times a day (BID) | ORAL | Status: DC
  Administered 2018-01-02 – 2018-01-04 (×4): 1000 ug via ORAL
  Filled 2018-01-02 (×4): qty 1

## 2018-01-02 MED ORDER — TRAMADOL HCL 50 MG PO TABS
50.0000 mg | ORAL_TABLET | Freq: Four times a day (QID) | ORAL | Status: DC | PRN
  Administered 2018-01-02 – 2018-01-07 (×3): 50 mg via ORAL
  Filled 2018-01-02 (×3): qty 1

## 2018-01-02 MED ORDER — SODIUM CHLORIDE 0.9 % IV SOLN
INTRAVENOUS | Status: DC
  Administered 2018-01-02 – 2018-01-03 (×3): via INTRAVENOUS

## 2018-01-02 MED ORDER — ONDANSETRON HCL 4 MG/2ML IJ SOLN
4.0000 mg | Freq: Four times a day (QID) | INTRAMUSCULAR | Status: DC | PRN
  Administered 2018-01-09 (×2): 4 mg via INTRAVENOUS
  Filled 2018-01-02 (×3): qty 2

## 2018-01-02 MED ORDER — ISOSORBIDE MONONITRATE ER 30 MG PO TB24
30.0000 mg | ORAL_TABLET | Freq: Every day | ORAL | Status: DC
  Administered 2018-01-03 – 2018-01-10 (×8): 30 mg via ORAL
  Filled 2018-01-02 (×8): qty 1

## 2018-01-02 MED ORDER — HYDRALAZINE HCL 50 MG PO TABS
50.0000 mg | ORAL_TABLET | Freq: Three times a day (TID) | ORAL | Status: DC
  Administered 2018-01-02 – 2018-01-10 (×22): 50 mg via ORAL
  Filled 2018-01-02 (×22): qty 1

## 2018-01-02 MED ORDER — ONDANSETRON HCL 4 MG PO TABS: 4.0000 mg | ORAL_TABLET | Freq: Four times a day (QID) | ORAL | Status: DC | PRN

## 2018-01-02 MED ORDER — MORPHINE SULFATE (PF) 2 MG/ML IV SOLN: 2.0000 mg | INTRAVENOUS | Status: DC | PRN

## 2018-01-02 MED ORDER — CLONAZEPAM 1 MG PO TABS
1.5000 mg | ORAL_TABLET | Freq: Every day | ORAL | Status: DC
  Administered 2018-01-02 – 2018-01-09 (×8): 1.5 mg via ORAL
  Filled 2018-01-02 (×8): qty 1

## 2018-01-02 MED ORDER — CLONAZEPAM 0.5 MG PO TABS: 0.5000 mg | ORAL_TABLET | Freq: Four times a day (QID) | ORAL | Status: DC

## 2018-01-02 MED ORDER — LEVOTHYROXINE SODIUM 125 MCG PO TABS
125.0000 ug | ORAL_TABLET | Freq: Every day | ORAL | Status: DC
  Administered 2018-01-03 – 2018-01-10 (×8): 125 ug via ORAL
  Filled 2018-01-02 (×8): qty 1

## 2018-01-02 MED ORDER — ALBUTEROL SULFATE (2.5 MG/3ML) 0.083% IN NEBU
2.5000 mg | INHALATION_SOLUTION | Freq: Four times a day (QID) | RESPIRATORY_TRACT | Status: DC | PRN
  Administered 2018-01-03: 2.5 mg via RESPIRATORY_TRACT
  Filled 2018-01-02: qty 3

## 2018-01-02 MED ORDER — CALCITRIOL 0.25 MCG PO CAPS
0.5000 ug | ORAL_CAPSULE | Freq: Every day | ORAL | Status: DC
  Administered 2018-01-03 – 2018-01-10 (×8): 0.5 ug via ORAL
  Filled 2018-01-02 (×8): qty 2

## 2018-01-02 MED ORDER — EZETIMIBE 10 MG PO TABS
10.0000 mg | ORAL_TABLET | Freq: Every day | ORAL | Status: DC
  Administered 2018-01-03 – 2018-01-10 (×8): 10 mg via ORAL
  Filled 2018-01-02 (×9): qty 1

## 2018-01-02 MED ORDER — OMEGA-3-ACID ETHYL ESTERS 1 G PO CAPS
2.0000 g | ORAL_CAPSULE | Freq: Two times a day (BID) | ORAL | Status: DC
  Administered 2018-01-02 – 2018-01-10 (×16): 2 g via ORAL
  Filled 2018-01-02 (×16): qty 2

## 2018-01-02 MED ORDER — MOMETASONE FURO-FORMOTEROL FUM 200-5 MCG/ACT IN AERO
2.0000 | INHALATION_SPRAY | Freq: Two times a day (BID) | RESPIRATORY_TRACT | Status: DC
  Administered 2018-01-02 – 2018-01-04 (×4): 2 via RESPIRATORY_TRACT
  Filled 2018-01-02: qty 8.8

## 2018-01-02 MED ORDER — HEPARIN SODIUM (PORCINE) 5000 UNIT/ML IJ SOLN
5000.0000 [IU] | Freq: Three times a day (TID) | INTRAMUSCULAR | Status: DC
  Administered 2018-01-02 – 2018-01-10 (×22): 5000 [IU] via SUBCUTANEOUS
  Filled 2018-01-02 (×18): qty 1

## 2018-01-02 MED ORDER — ACETAMINOPHEN 500 MG PO TABS: 500.0000 mg | ORAL_TABLET | Freq: Four times a day (QID) | ORAL | Status: DC | PRN

## 2018-01-02 NOTE — ED Notes (Signed)
Patient transported to X-ray 

## 2018-01-02 NOTE — ED Notes (Signed)
Family at bedside. 

## 2018-01-02 NOTE — Telephone Encounter (Signed)
Pt currently in ED.

## 2018-01-02 NOTE — H&P (Signed)
[facility] NAME: [name]    MR#:  [id number]  DATE OF BIRTH:  01/08/1941  DATE OF ADMISSION:  01/02/2018  PRIMARY CARE PHYSICIAN: Lemmie Evens, MD   REQUESTING/REFERRING PHYSICIAN: Dr. Lenise Arena  CHIEF COMPLAINT:   Chief Complaint  Patient presents with  . Wound Infection  . Shortness of Breath    HISTORY OF PRESENT ILLNESS:  [name]  is a 77 y.o. female with a known history of CAD status post PCI in 2004, peripheral vascular disease, CKD stage III, COPD on 2L home oxygen, GERD, hypertension, A. fib not on anticoagulation due to anemia, anemia of chronic disease, recent fall presents to hospital secondary to worsening weakness and left leg wound with discharge. Patient had a fall about 2 weeks ago, went to [facility] had a skin tear and was treated.  She got admitted to our hospital about 10 days ago and was noted to have anemia and acute renal failure.  She was treated with IV fluids, discharged home.  Her Pradaxa was discontinued at discharge.  Hemoglobin is stable at this time.  Her wound which appeared superficial at the time became necrotic and the dressing was saturated with purulent discharge in the last couple of days.  Patient denies any fevers but complains of chills and progressive weakness.  In the ER the dressing was removed and she had extensively purulent necrotic wound on the lateral side of the left leg.  The ER physician has debrided and sent for cultures.  Also labs noted acute on chronic renal failure.  She does have worsening pedal edema as well.   PAST MEDICAL HISTORY:   Past Medical History:  Diagnosis Date  . CAD (coronary artery disease)    a. 10/2002 PCI: LAD 62m (2.75x23 Cypher DES); b. 09/2004 Cath: LM nl, LAD 40p, patent stent, D1 30p, LCX 20p, diff dzs throughout, OM1 50p, OM2 60p, OM3 40p, RCA 40p-->Med Rx; c. 10/2012 MV: No ischemia/infarct.  . Carotid artery disease ([place])   .  Cervical disc disease   . CKD (chronic kidney disease), stage III ([place])   . COPD (chronic obstructive pulmonary disease) ([place])   . Diastolic dysfunction    a. 03/2017 Echo: Ef 60-65%, Gr2 DD, mild MR, mildly dil LA/RA, mildly to mod dil RV w/ nl fxn, PASP 65mmHg.  Marland Kitchen Dysphagia   . GERD (gastroesophageal reflux disease)   . History of kidney stones   . Hypertension    2002  . Hypertriglyceridemia   . Hypothyroidism   . PAD (peripheral artery disease) ([place])    a. 10/2017 ABI's R = 0.78, L = 0.88. Duplex showed borderline R CFA and SFA dzs->med rx.  Marland Kitchen PAF (paroxysmal atrial fibrillation) ([place])    a. Amio/Pradaxa (CHA2DS2VASc = 6).  Marland Kitchen PONV (postoperative nausea and vomiting)   . Right renal artery stenosis ([place])    a. 09/2017 Renal Artery Duplex: no significant LRA stenosis. RRA <60%.    PAST SURGICAL HISTORY:   Past Surgical History:  Procedure Laterality Date  . ABDOMINAL HYSTERECTOMY    . AMPUTATION Right 04/12/2014   Procedure: Revision AMPUTATION Right Long DIGIT;  Surgeon: Leanora Cover, MD;  Location: [facility];  Service: Orthopedics;  Laterality: Right;  . BALLOON DILATION  05/05/2011   Procedure: BALLOON DILATION;  Surgeon: Rogene Houston, MD;  [facility];  Service: Endoscopy;  Laterality: N/A;  . CARDIAC CATHETERIZATION     with stent 2004  CYPHER 2.75 x 41mm coronary stent in the mid left anterior descending stenotic lesion    Last stress test showed EF of 775  . CARDIOVERSION    . CATARACT EXTRACTION Left 2002  . CATARACT EXTRACTION W/PHACO Right 05/14/2013   Procedure: CATARACT EXTRACTION PHACO AND INTRAOCULAR LENS PLACEMENT RIGHT EYE;  Surgeon: Tonny Branch, MD;  Location: [facility];  Service: Ophthalmology;  Laterality: Right;  CDE:  20.45  . CERVICAL DISC SURGERY    . CHOLECYSTECTOMY    . CORONARY ANGIOPLASTY     1 stent  . ESOPHAGEAL DILATION N/A 04/25/2015   Procedure: ESOPHAGEAL DILATION;  Surgeon: Rogene Houston, MD;  [facility];  Service:  Endoscopy;  Laterality: N/A;  . ESOPHAGOGASTRODUODENOSCOPY N/A 04/25/2015   Procedure: ESOPHAGOGASTRODUODENOSCOPY (EGD);  Surgeon: Rogene Houston, MD;  [facility];  Service: Endoscopy;  Laterality: N/A;  1225  . MALONEY DILATION  05/05/2011   Procedure: MALONEY DILATION;  Surgeon: Rogene Houston, MD;  [facility];  Service: Endoscopy;  Laterality: N/A;  . right knee arthroscopy  2010  . SAVORY DILATION  05/05/2011   Procedure: SAVORY DILATION;  Surgeon: Rogene Houston, MD;  [facility];  Service: Endoscopy;  Laterality: N/A;  . SHOULDER SURGERY Right    torn rotator cuff  . TOENAIL EXCISION    . TOTAL ABDOMINAL HYSTERECTOMY W/ BILATERAL SALPINGOOPHORECTOMY      SOCIAL HISTORY:   Social History   Tobacco Use  . Smoking status: Current Every Day Smoker    Packs/day: 1.00    Years: 56.00    Pack years: 56.00    Types: Cigarettes  . Smokeless tobacco: Never Used  Substance Use Topics  . Alcohol use: No    Alcohol/week: 0.6 oz    Types: 1 Cans of beer per week    FAMILY HISTORY:   Family History  Problem Relation Age of Onset  . Diabetes Mother   . Heart failure Mother   . Stroke Mother   . Heart attack Father   . Pancreatic cancer Sister   . Heart disease Sister   . Heart disease Brother   . Hypertension Sister   . Colon cancer Neg Hx     DRUG ALLERGIES:   Allergies  Allergen Reactions  . Ciprofloxacin Hcl Anaphylaxis  . Penicillins Anaphylaxis    Near death experience,  Has patient had a PCN reaction causing immediate rash, facial/tongue/throat swelling, SOB or lightheadedness with hypotension: Yes Has patient had a PCN reaction causing severe rash involving mucus membranes or skin necrosis: No Has patient had a PCN reaction that required hospitalization: Yes Has patient had a PCN reaction occurring within the last 10 years: No If all of the above answers are "NO", then may proceed with Cephalosporin use.  . Amlodipine      Leg swelling  . Ceclor [Cefaclor] Nausea And Vomiting  . Codeine Nausea Only and Other (See Comments)    Cramps, nausea  . Lipitor [Atorvastatin] Other (See Comments)    Myalgias   . Sulfa Antibiotics Rash         REVIEW OF SYSTEMS:   Review of Systems  Constitutional: Negative for chills, fever, malaise/fatigue and weight loss.  HENT: Negative for ear discharge, ear pain, hearing loss, nosebleeds and tinnitus.   Eyes: Negative for blurred vision, double vision and photophobia.  Respiratory: Positive for shortness of breath. Negative for cough, hemoptysis and wheezing.   Cardiovascular: Positive for leg swelling. Negative for  chest pain, palpitations and orthopnea.  Gastrointestinal: Negative for abdominal pain, constipation, diarrhea, heartburn, melena, nausea and vomiting.  Genitourinary: Negative for dysuria, frequency, hematuria and urgency.  Musculoskeletal: Positive for myalgias. Negative for back pain and neck pain.  Skin: Negative for rash.  Neurological: Positive for weakness. Negative for dizziness, tingling, tremors, sensory change, speech change, focal weakness and headaches.  Endo/Heme/Allergies: Does not bruise/bleed easily.  Psychiatric/Behavioral: Negative for depression.    MEDICATIONS AT HOME:   Prior to Admission medications   Medication Sig Start Date End Date Taking? Authorizing Provider  Acetaminophen (TYLENOL PO) Take 500-1,000 mg by mouth every 6 (six) hours as needed (pain).     [provider]  ADVAIR DISKUS 500-50 MCG/DOSE AEPB 1 puff 2 (two) times daily.  02/28/13   [provider]  albuterol (PROVENTIL) (2.5 MG/3ML) 0.083% nebulizer solution Take 2.5 mg by nebulization every 6 (six) hours as needed. For shortness of breath    [provider]  amiodarone (PACERONE) 200 MG tablet TAKE ONE (1) TABLET EACH DAY 02/23/17   Minna Merritts, MD  Ascorbic Acid (VITAMIN C) 1000 MG tablet Take 1,000 mg by mouth daily.    [provider]  calcitRIOL (ROCALTROL) 0.5 MCG capsule Take 0.5 mcg by mouth daily. 01/06/17   [provider]  Cetirizine HCl 10 MG CAPS Take 10 mg by mouth daily.    [provider]  cholecalciferol (VITAMIN D) 1000 units tablet Take 2,000 Units by mouth daily.    [provider]  clonazePAM (KLONOPIN) 1 MG tablet Take 0.5-1.5 mg by mouth 3 (three) times daily as needed for anxiety. Takes 0.5mg  three times a day and takes 1.5mg  at bedtime    [provider]  colchicine 0.6 MG tablet Take one tablet by mouth three times daily at the first sign of a gout attack.  Decrease to one tablet daily if gastric upset occurs. 12/13/16   Hyatt, Max T, DPM  dabigatran (PRADAXA) 150 MG CAPS capsule Take 1 capsule (150 mg total) by mouth 2 (two) times daily. 04/29/15   Minna Merritts, MD  furosemide (LASIX) 40 MG tablet Take 40 mg by mouth 2 (two) times daily. 01/06/17   [provider]  hydrALAZINE (APRESOLINE) 50 MG tablet Take 1 tablet (50 mg total) by mouth 3 (three) times daily. 02/22/17   Minna Merritts, MD  isosorbide mononitrate (IMDUR) 60 MG 24 hr tablet TAKE ONE TABLET (60MG  TOTAL) BY MOUTH TWO TIMES DAILY 12/29/17   Minna Merritts, MD  levothyroxine (SYNTHROID, LEVOTHROID) 125 MCG tablet Take 125 mcg by mouth daily.    [provider]  nitroGLYCERIN (NITROLINGUAL) 0.4 MG/SPRAY spray Place 1 spray under the tongue every 5 (five) minutes x 3 doses as needed for chest pain. 07/27/17   Minna Merritts, MD  omega-3 acid ethyl esters (LOVAZA) 1 G capsule Take 2 g by mouth 2 (two) times daily.      [provider]  ranitidine (ZANTAC) 150 MG capsule Take 150 mg by mouth daily as needed for heartburn.    [provider]  vitamin B-12 (CYANOCOBALAMIN) 1000 MCG tablet Take 1,000 mcg by mouth 2 (two) times daily.    [provider]      VITAL SIGNS:  Blood pressure (!) 144/52, pulse (!) 44, temperature 97.6 F (36.4 C),  temperature source Oral, resp. rate 14, height 5\' 7"  (1.702 m), weight 90.7 kg (200 lb), SpO2 100 %.  PHYSICAL EXAMINATION:   Physical  Exam  GENERAL:  77 y.o.-year-old obese patient lying in the bed with no acute distress.  EYES: Pupils equal, round, reactive to light and accommodation. No scleral icterus. Extraocular muscles intact.  HEENT: Head atraumatic, normocephalic. Oropharynx and nasopharynx clear.  NECK:  Supple, no jugular venous distention. No thyroid enlargement, no tenderness.  LUNGS: Coarse wheezing scattered bilaterally, decreased breath sounds at the bases.  No rhonchi or crackles.  No use of accessory muscles of respiration.  CARDIOVASCULAR: S1, S2 normal. No murmurs, rubs, or gallops.  ABDOMEN: Soft, nontender, nondistended. Bowel sounds present. No organomegaly or mass.  EXTREMITIES: No cyanosis, or clubbing.  There is bilateral pedal edema noted.  Left lateral leg with the wound, dressing has been placed NEUROLOGIC: Cranial nerves II through XII are intact. Muscle strength 5/5 in all extremities. Sensation intact. Gait not checked.  Will weakness noted. PSYCHIATRIC: The patient is alert and oriented x 3.  SKIN: No obvious rash, lesion, or ulcer.   LABORATORY PANEL:   CBC Recent Labs  Lab 01/02/18 1338  WBC 4.9  HGB 8.5*  HCT 25.6*  PLT 305   ------------------------------------------------------------------------------------------------------------------  Chemistries  Recent Labs  Lab 01/02/18 1338  NA 131*  K 4.8  CL 97*  CO2 26  GLUCOSE 111*  BUN 54*  CREATININE 2.94*  CALCIUM 8.7*  AST 33  ALT 22  ALKPHOS 82  BILITOT 0.8   ------------------------------------------------------------------------------------------------------------------  Cardiac Enzymes Recent Labs  Lab 01/02/18 1338  TROPONINI 0.03*   ------------------------------------------------------------------------------------------------------------------  RADIOLOGY:  Dg Chest  2 View  Result Date: 01/02/2018 CLINICAL DATA:  Shortness of breath. EXAM: CHEST - 2 VIEW COMPARISON:  Radiograph December 19, 2017. FINDINGS: Stable cardiomegaly. Atherosclerosis of thoracic aorta is noted. No pneumothorax or pleural effusion is noted. No acute pulmonary disease is noted. Bony thorax is unremarkable. IMPRESSION: Stable cardiomegaly.  No acute cardiopulmonary abnormality seen. Aortic Atherosclerosis (ICD10-I70.0). Electronically Signed   By: Marijo Conception, M.D.   On: 01/02/2018 14:38   Dg Tibia/fibula Left  Result Date: 01/02/2018 CLINICAL DATA:  Left leg laceration. EXAM: LEFT TIBIA AND FIBULA - 2 VIEW COMPARISON:  None. FINDINGS: There is no evidence of fracture or other focal bone lesions. Large soft tissue defect is seen medially and posteriorly in the distal left calf consistent with laceration. No radiopaque foreign body is noted. IMPRESSION: No fracture or dislocation is noted. Large soft tissue defect seen medially and posteriorly in distal left calf consistent with laceration. Electronically Signed   By: Marijo Conception, M.D.   On: 01/02/2018 14:40    EKG:   Orders placed or performed during the hospital encounter of 01/02/18  . ED EKG  . ED EKG    IMPRESSION AND PLAN:   Fayne Mcguffee  is a 77 y.o. female with a known history of CAD status post PCI in 2004, peripheral vascular disease, CKD stage III, COPD on 2 L home oxygen, GERD, hypertension, A. fib not on anticoagulation due to anemia, anemia of chronic disease, recent fall presents to hospital secondary to worsening weakness and left leg wound with discharge.  1. Necrotic purulent left leg wound with cellulitis-triggered after a trauma and pressure injury. -Status post superficial debridement and wound culture sent for -Cover with vancomycin and Zosyn-renally dose -Podiatry consult requested.  2.  Acute renal failure on CKD stage III-Baseline creatinine seems to be around 2.2, now gradually worsening -Recent  admission with creatinine improved up to 1.8, now at 2.9 again -Follows with nephrology as outpatient.  Consult  nephrology, renal ultrasound. -BUN is also elevated, will try with hydration.  Avoid nephrotoxins.  Renally dose her medications  3.  Chronic diastolic heart failure-chest x-ray with cardiomegaly, no pulmonary edema noted.  However has 2+ lower extremity edema -no diuretics due to renal failure. -Cardiology consult requested  4.  Atrial fibrillation-chronic A. fib, bradycardic.  Not on any beta-blockers. -Cardiology consulted -Hold amiodarone due to bradycardia. -Pradaxa has been discontinued last admission due to anemia  5.  Anemia of chronic disease-hold Pradaxa.  Check anemia labs.  No indication for transfusion at this time.  6.  Hypertension-on hydralazine and Imdur  7.  DVT prophylaxis-subcutaneous heparin  Physical therapy consult requested   All the records are reviewed and case discussed with ED provider. Management plans discussed with the patient, family and they are in agreement.  CODE STATUS: Full Code  TOTAL TIME TAKING CARE OF THIS PATIENT: 50 minutes.    Gladstone Lighter M.D on 01/02/2018 at 3:18 PM  Between 7am to 6pm - Pager - [phone]  After 6pm go to [email or web address] - password [email or web address] [facility]  Office  [phone]  CC: Primary care physician; Lemmie Evens, MD

## 2018-01-02 NOTE — ED Notes (Signed)
Wound to left leg cleaned by MD Jimmye Norman and dressed with kerlex

## 2018-01-02 NOTE — Telephone Encounter (Signed)
Pt c/o swelling: STAT is pt has developed SOB within 24 hours  1) How much weight have you gained and in what time span? Not sure  2) If swelling, where is the swelling located? Both feet and legs, and "all up in my body"  3) Are you currently taking a fluid pill? yes  4) Are you currently SOB? yes  5) Do you have a log of your daily weights (if so, list)?   6) Have you gained 3 pounds in a day or 5 pounds in a week?   7) Have you traveled recently? no

## 2018-01-02 NOTE — ED Provider Notes (Signed)
[facility] Emergency Department Provider Note       Time seen: ----------------------------------------- 1:36 PM on 01/02/2018 -----------------------------------------   I have reviewed the triage vital signs and the nursing notes.  HISTORY   Chief Complaint Wound Infection and Shortness of Breath   HPI [name] is a 77 y.o. female with a history of coronary artery disease, CKD, COPD, dysphasia, GERD, hyperlipidemia, paroxysmal atrial fibrillation who presents to the ED for a wound to left lower extremity.  They have noticed a change in the left lower extremity smell.  The wound has a very foul smell and has been draining.  She reports that laceration was originally sutured and she has not recently had some suture removal.  She has pitting edema to both lower extremities.  She also presents with shortness of breath and was noted to be bradycardic on arrival.  Past Medical History:  Diagnosis Date  . CAD (coronary artery disease)    a. 10/2002 PCI: LAD 37m (2.75x23 Cypher DES); b. 09/2004 Cath: LM nl, LAD 40p, patent stent, D1 30p, LCX 20p, diff dzs throughout, OM1 50p, OM2 60p, OM3 40p, RCA 40p-->Med Rx; c. 10/2012 MV: No ischemia/infarct.  . Carotid artery disease ([place])   . Cervical disc disease   . CKD (chronic kidney disease), stage III ([place])   . COPD (chronic obstructive pulmonary disease) ([place])   . Diastolic dysfunction    a. 03/2017 Echo: Ef 60-65%, Gr2 DD, mild MR, mildly dil LA/RA, mildly to mod dil RV w/ nl fxn, PASP 62mmHg.  Marland Kitchen Dysphagia   . GERD (gastroesophageal reflux disease)   . History of kidney stones   . Hypertension    2002  . Hypertriglyceridemia   . Hypothyroidism   . PAD (peripheral artery disease) ([place])    a. 10/2017 ABI's R = 0.78, L = 0.88. Duplex showed borderline R CFA and SFA dzs->med rx.  Marland Kitchen PAF (paroxysmal atrial fibrillation) ([place])    a. Amio/Pradaxa (CHA2DS2VASc = 6).  Marland Kitchen PONV (postoperative nausea and vomiting)   .  Right renal artery stenosis ([place])    a. 09/2017 Renal Artery Duplex: no significant LRA stenosis. RRA <60%.    Patient Active Problem List   Diagnosis Date Noted  . AKI (acute kidney injury) ([place]) 12/19/2017  . COPD (chronic obstructive pulmonary disease) ([place]) 07/26/2017  . Chronic renal impairment, stage 3 (moderate) ([place]) 07/19/2016  . Back pain 08/01/2015  . Bradycardia 08/01/2015  . Hypothyroidism 01/27/2015  . HTN (hypertension) 01/27/2015  . Neck pain 01/18/2014  . Paroxysmal atrial fibrillation ([place]) 03/26/2013  . Fatigue 03/26/2013  . SOB (shortness of breath) 03/26/2013  . Chest discomfort 03/26/2013  . CAD (coronary artery disease) 03/26/2013  . Smoker 03/26/2013  . Hyperlipidemia 03/26/2013    Past Surgical History:  Procedure Laterality Date  . ABDOMINAL HYSTERECTOMY    . AMPUTATION Right 04/12/2014   Procedure: Revision AMPUTATION Right Long DIGIT;  Surgeon: Leanora Cover, MD;  Location: [place];  Service: Orthopedics;  Laterality: Right;  . BALLOON DILATION  05/05/2011   Procedure: BALLOON DILATION;  Surgeon: Rogene Houston, MD;  [facility];  Service: Endoscopy;  Laterality: N/A;  . CARDIAC CATHETERIZATION     with stent 2004  CYPHER 2.75 x 44mm coronary stent in the mid left anterior descending stenotic lesion    Last stress test showed EF of 775  . CARDIOVERSION    . CATARACT EXTRACTION Left 2002  . CATARACT EXTRACTION W/PHACO Right 05/14/2013  Procedure: CATARACT EXTRACTION PHACO AND INTRAOCULAR LENS PLACEMENT RIGHT EYE;  Surgeon: Tonny Branch, MD;  Location: [facility];  Service: Ophthalmology;  Laterality: Right;  CDE:  20.45  . CERVICAL DISC SURGERY    . CHOLECYSTECTOMY    . CORONARY ANGIOPLASTY     1 stent  . ESOPHAGEAL DILATION N/A 04/25/2015   Procedure: ESOPHAGEAL DILATION;  Surgeon: Rogene Houston, MD;  [facility];  Service: Endoscopy;  Laterality: N/A;  . ESOPHAGOGASTRODUODENOSCOPY N/A 04/25/2015   Procedure:  ESOPHAGOGASTRODUODENOSCOPY (EGD);  Surgeon: Rogene Houston, MD;  [facility];  Service: Endoscopy;  Laterality: N/A;  1225  . MALONEY DILATION  05/05/2011   Procedure: MALONEY DILATION;  Surgeon: Rogene Houston, MD;  [facility];  Service: Endoscopy;  Laterality: N/A;  . right knee arthroscopy  2010  . SAVORY DILATION  05/05/2011   Procedure: SAVORY DILATION;  Surgeon: Rogene Houston, MD;  [facility];  Service: Endoscopy;  Laterality: N/A;  . SHOULDER SURGERY Right    torn rotator cuff  . TOENAIL EXCISION    . TOTAL ABDOMINAL HYSTERECTOMY W/ BILATERAL SALPINGOOPHORECTOMY      Allergies Ciprofloxacin hcl; Penicillins; Amlodipine; Ceclor [cefaclor]; Codeine; Lipitor [atorvastatin]; and Sulfa antibiotics  Social History Social History   Tobacco Use  . Smoking status: Current Every Day Smoker    Packs/day: 1.00    Years: 56.00    Pack years: 56.00    Types: Cigarettes  . Smokeless tobacco: Never Used  Substance Use Topics  . Alcohol use: No    Alcohol/week: 0.6 oz    Types: 1 Cans of beer per week  . Drug use: No   Review of Systems Constitutional: Negative for fever. Cardiovascular: Negative for chest pain. Respiratory: Positive for shortness of breath Gastrointestinal: Negative for abdominal pain, vomiting and diarrhea. Musculoskeletal: Positive for left lower extremity foul-smelling wound and drainage Skin: Negative for rash. Neurological: Negative for headaches, focal weakness or numbness.  All systems negative/normal/unremarkable except as stated in the HPI  ____________________________________________   PHYSICAL EXAM:  VITAL SIGNS: ED Triage Vitals  Enc Vitals Group     BP 01/02/18 1319 (!) 110/41     Pulse Rate 01/02/18 1319 (!) 41     Resp 01/02/18 1319 (!) 22     Temp 01/02/18 1319 97.6 F (36.4 C)     Temp Source 01/02/18 1319 Oral     SpO2 01/02/18 1316 96 %     Weight 01/02/18 1316 200 lb (90.7 kg)     Height  01/02/18 1316 5\' 7"  (1.702 m)     Head Circumference --      Peak Flow --      Pain Score 01/02/18 1316 9     Pain Loc --      Pain Edu? --      Excl. in [place]? --    Constitutional: Alert and oriented. Well appearing and in no distress. Eyes: Conjunctivae are normal. Normal extraocular movements. ENT   Head: Normocephalic and atraumatic.   Nose: No congestion/rhinnorhea.   Mouth/Throat: Mucous membranes are moist.   Neck: No stridor. Cardiovascular: Slow rate, irregular rhythm, No murmurs, rubs, or gallops. Respiratory: Tachypnea with wheezing bilaterally Gastrointestinal: Soft and nontender. Normal bowel sounds Musculoskeletal: Necrotic smelling and appearing wound in the left lower extremity medially above the ankle.  The wound is deep and necrotic appearing Neurologic large approximately 8 cm soft tissue defect and deep wound in the left lower extremity that  is necrotic Psychiatric: Mood and affect are normal. Speech and behavior are normal.  ____________________________________________  EKG: Interpreted by me.  [place] sinus bradycardia with sinus arrhythmia, rate is 42 bpm, low voltage QRS, normal QT  ____________________________________________  ED COURSE:  As part of my medical decision making, I reviewed the following data within the [place] History obtained from family if available, nursing notes, old chart and ekg, as well as notes from prior ED visits. Patient presented for left lower extremity wound who was also found to be bradycardic and wheezing, we will assess with labs and imaging as indicated at this time.   Marland Kitchen.Incision and Drainage Date/Time: 01/02/2018 2:03 PM Performed by: Earleen Newport, MD Authorized by: Earleen Newport, MD   Consent:    Consent obtained:  Verbal   Consent given by:  Patient Location:    Type:  Surgical wound infection   Size:  8cm   Location:  Lower extremity   Lower extremity location:  Leg    Leg location:  L lower leg Pre-procedure details:    Skin preparation:  Betadine Anesthesia (see MAR for exact dosages):    Anesthesia method:  None Procedure type:    Complexity:  Complex Procedure details:    Surgical wound: surgical wound reopened     Needle aspiration: no     Wound management:  Debrided   Drainage:  Purulent   Packing materials:  1/2 in gauze Post-procedure details:    Patient tolerance of procedure:  Tolerated well, no immediate complications Comments:     Patient has an extensive lower extremity wound which I debrided extensively.  It was then packed with saline soaked gauze and wrapped in Kerlix.   ____________________________________________   LABS (pertinent positives/negatives)  Labs Reviewed  CBC WITH DIFFERENTIAL/PLATELET - Abnormal; Notable for the following components:      Result Value   RBC 2.42 (*)    Hemoglobin 8.5 (*)    HCT 25.6 (*)    MCV 106.0 (*)    MCH 35.1 (*)    RDW 16.1 (*)    Lymphs Abs 0.9 (*)    All other components within normal limits  COMPREHENSIVE METABOLIC PANEL - Abnormal; Notable for the following components:   Sodium 131 (*)    Chloride 97 (*)    Glucose, Bld 111 (*)    BUN 54 (*)    Creatinine, Ser 2.94 (*)    Calcium 8.7 (*)    Total Protein 6.4 (*)    Albumin 3.3 (*)    GFR calc non Af Amer 14 (*)    GFR calc Af Amer 17 (*)    All other components within normal limits  TROPONIN I - Abnormal; Notable for the following components:   Troponin I 0.03 (*)    All other components within normal limits  AEROBIC/ANAEROBIC CULTURE (SURGICAL/DEEP WOUND)   CRITICAL CARE Performed by: Laurence Aly   Total critical care time: 30 minutes  Critical care time was exclusive of separately billable procedures and treating other patients.  Critical care was necessary to treat or prevent imminent or life-threatening deterioration.  Critical care was time spent personally by me on the following activities:  development of treatment plan with patient and/or surrogate as well as nursing, discussions with consultants, evaluation of patient's response to treatment, examination of patient, obtaining history from patient or surrogate, ordering and performing treatments and interventions, ordering and review of laboratory studies, ordering and review of radiographic studies, pulse  oximetry and re-evaluation of patient's condition.  RADIOLOGY Images were viewed by me  Chest x-ray, tib-fib IMPRESSION: No fracture or dislocation is noted. Large soft tissue defect seen medially and posteriorly in distal left calf consistent with laceration. ____________________________________________  DIFFERENTIAL DIAGNOSIS   Wound infection, necrotizing fasciitis, cellulitis, DVT, peripheral vascular disease, COPD, arrhythmia  FINAL ASSESSMENT AND PLAN  Wound infection, bradycardia, COPD exacerbation   Plan: The patient had presented for foul-smelling and necrotic wound. Patient's labs revealed chronic anemia and acute on chronic renal insufficiency with an elevated troponin of 0.03. Patient's imaging of the chest was negative but the left lower extremity revealed a large soft tissue defect from recent laceration and subsequent wound.  The wound is necrotic and will require further debridement.  She also has COPD and will require duo nebs and steroids.  She will also require gentle hydration to ensure improvement in her renal function and telemetry for her bradycardia.   Laurence Aly, MD   Note: This note was generated in part or whole with voice recognition software. Voice recognition is usually quite accurate but there are transcription errors that can and very often do occur. I apologize for any typographical errors that were not detected and corrected.     Earleen Newport, MD 01/02/18 [phone]

## 2018-01-02 NOTE — ED Notes (Signed)
Pt adjusted in bed for comfort and pure wick placed to allow pt to urinate.

## 2018-01-02 NOTE — ED Notes (Signed)
Pt up to bedside commode. Pt unable to urinate with external catheter

## 2018-01-02 NOTE — ED Triage Notes (Signed)
Pt reports that she fell last week and caused laceration to left lower leg - she reports that the laceration was sutured - she states that the wound was draining and Saturday started with foul odor Pt also reports shortness of breath - she appears in no acute distress - pt has pitting edema to bilat lower ext

## 2018-01-02 NOTE — Progress Notes (Signed)
Pharmacy Antibiotic Note  [name] is a 77 y.o. female admitted on 01/02/2018 with Wound- purulent , necrotic on L leg.Marland Kitchen  Pharmacy has been consulted for Vancomycin and Meropenem dosing.  Patient at [facility] 6/16-6/18/19 for AKI, fall  NOTE ALLERGIES  Allergies  Allergen Reactions  . Ciprofloxacin Hcl Anaphylaxis  . Penicillins Anaphylaxis    Near death experience,  Has patient had a PCN reaction causing immediate rash, facial/tongue/throat swelling, SOB or lightheadedness with hypotension: Yes Has patient had a PCN reaction causing severe rash involving mucus membranes or skin necrosis: No Has patient had a PCN reaction that required hospitalization: Yes Has patient had a PCN reaction occurring within the last 10 years: No If all of the above answers are "NO", then may proceed with Cephalosporin use.  . Amlodipine     Leg swelling  . Ceclor [Cefaclor] Nausea And Vomiting  . Codeine Nausea Only and Other (See Comments)    Cramps, nausea  . Lipitor [Atorvastatin] Other (See Comments)    Myalgias   . Sulfa Antibiotics Rash         Plan: Patient received Vancomycin 1 gram IV x 1 in ER and to receive Meropenem 500 mg x 1 in ER. Goal Vancomycin trough= 15-20 mcg/ml. Given patient has AKI, Crcl of 18.5 ml/min and Scr 2.94,  will check random Vancomycin level 24 hrs after dose to determine next dose since patient renal fxn in unstable range.  Will continue with Meropenem 500mg  IV Q12h for Crcl 18.5 ml/min. Pharmacy note for nursing to monitor patient d/t hx of allergy to PCN.   Height: 5\' 7"  (170.2 cm) Weight: 200 lb (90.7 kg) IBW/kg (Calculated) : 61.6  Temp (24hrs), Avg:97.6 F (36.4 C), Min:97.6 F (36.4 C), Max:97.6 F (36.4 C)  Recent Labs  Lab 01/02/18 1338  WBC 4.9  CREATININE 2.94*    Estimated Creatinine Clearance: 18.5 mL/min (A) (by C-G formula based on SCr of 2.94 mg/dL (H)).    Allergies  Allergen Reactions  . Ciprofloxacin Hcl Anaphylaxis  .  Penicillins Anaphylaxis    Near death experience,  Has patient had a PCN reaction causing immediate rash, facial/tongue/throat swelling, SOB or lightheadedness with hypotension: Yes Has patient had a PCN reaction causing severe rash involving mucus membranes or skin necrosis: No Has patient had a PCN reaction that required hospitalization: Yes Has patient had a PCN reaction occurring within the last 10 years: No If all of the above answers are "NO", then may proceed with Cephalosporin use.  . Amlodipine     Leg swelling  . Ceclor [Cefaclor] Nausea And Vomiting  . Codeine Nausea Only and Other (See Comments)    Cramps, nausea  . Lipitor [Atorvastatin] Other (See Comments)    Myalgias   . Sulfa Antibiotics Rash         Antimicrobials this admission: Vanc  7/1 >>   Meropenem  7/1 >>    Dose adjustments this admission:    Microbiology results:   BCx:     UCx:      Sputum:      MRSA PCR:   7/1 Wound cx: pending  Thank you for allowing pharmacy to be a part of this patient's care.  Anand Tejada A 01/02/2018 4:40 PM

## 2018-01-02 NOTE — ED Notes (Signed)
FIRST NURSE NOTE: Pt in wheelchair, pt in NAD. RR even and unlabored at rest.

## 2018-01-03 DIAGNOSIS — D649 Anemia, unspecified: Secondary | ICD-10-CM

## 2018-01-03 DIAGNOSIS — L97309 Non-pressure chronic ulcer of unspecified ankle with unspecified severity: Secondary | ICD-10-CM

## 2018-01-03 DIAGNOSIS — I480 Paroxysmal atrial fibrillation: Secondary | ICD-10-CM

## 2018-01-03 DIAGNOSIS — N189 Chronic kidney disease, unspecified: Secondary | ICD-10-CM

## 2018-01-03 DIAGNOSIS — R600 Localized edema: Secondary | ICD-10-CM

## 2018-01-03 DIAGNOSIS — I2510 Atherosclerotic heart disease of native coronary artery without angina pectoris: Secondary | ICD-10-CM

## 2018-01-03 DIAGNOSIS — I5033 Acute on chronic diastolic (congestive) heart failure: Secondary | ICD-10-CM

## 2018-01-03 LAB — BASIC METABOLIC PANEL
ANION GAP: 8 (ref 5–15)
BUN: 48 mg/dL — ABNORMAL HIGH (ref 8–23)
CO2: 26 mmol/L (ref 22–32)
Calcium: 8.5 mg/dL — ABNORMAL LOW (ref 8.9–10.3)
Chloride: 99 mmol/L (ref 98–111)
Creatinine, Ser: 2.26 mg/dL — ABNORMAL HIGH (ref 0.44–1.00)
GFR calc Af Amer: 23 mL/min — ABNORMAL LOW (ref >60)
GFR calc non Af Amer: 20 mL/min — ABNORMAL LOW (ref >60)
GLUCOSE: 91 mg/dL (ref 70–99)
POTASSIUM: 4.5 mmol/L (ref 3.5–5.1)
Sodium: 133 mmol/L — ABNORMAL LOW (ref 135–145)

## 2018-01-03 LAB — ABO/RH: ABO/RH(D): B POS

## 2018-01-03 LAB — CBC
HEMATOCRIT: 22.3 % — AB (ref 35.0–47.0)
HEMOGLOBIN: 7.3 g/dL — AB (ref 12.0–16.0)
MCH: 34.5 pg — AB (ref 26.0–34.0)
MCHC: 32.9 g/dL (ref 32.0–36.0)
MCV: 104.8 fL — ABNORMAL HIGH (ref 80.0–100.0)
Platelets: 265 10*3/uL (ref 150–440)
RBC: 2.13 MIL/uL — ABNORMAL LOW (ref 3.80–5.20)
RDW: 15.7 % — ABNORMAL HIGH (ref 11.5–14.5)
WBC: 4.1 10*3/uL (ref 3.6–11.0)

## 2018-01-03 LAB — PREPARE RBC (CROSSMATCH)

## 2018-01-03 LAB — VITAMIN B12: Vitamin B-12: 3649 pg/mL — ABNORMAL HIGH (ref 180–914)

## 2018-01-03 LAB — VANCOMYCIN, RANDOM: Vancomycin Rm: 8

## 2018-01-03 MED ORDER — SODIUM CHLORIDE 0.9 % IV SOLN
1.0000 g | Freq: Two times a day (BID) | INTRAVENOUS | Status: DC
  Administered 2018-01-03 – 2018-01-06 (×6): 1 g via INTRAVENOUS
  Filled 2018-01-03 (×7): qty 1

## 2018-01-03 MED ORDER — IPRATROPIUM-ALBUTEROL 0.5-2.5 (3) MG/3ML IN SOLN
3.0000 mL | Freq: Four times a day (QID) | RESPIRATORY_TRACT | Status: DC
  Administered 2018-01-03 – 2018-01-08 (×17): 3 mL via RESPIRATORY_TRACT
  Filled 2018-01-03 (×18): qty 3

## 2018-01-03 MED ORDER — BUDESONIDE 0.5 MG/2ML IN SUSP
0.5000 mg | Freq: Two times a day (BID) | RESPIRATORY_TRACT | Status: DC
  Administered 2018-01-03 – 2018-01-10 (×14): 0.5 mg via RESPIRATORY_TRACT
  Filled 2018-01-03 (×15): qty 2

## 2018-01-03 MED ORDER — SODIUM CHLORIDE 0.9% IV SOLUTION
Freq: Once | INTRAVENOUS | Status: AC
  Administered 2018-01-03: 15:00:00 via INTRAVENOUS

## 2018-01-03 MED ORDER — VANCOMYCIN HCL 10 G IV SOLR
1250.0000 mg | INTRAVENOUS | Status: DC
  Filled 2018-01-03: qty 1250

## 2018-01-03 MED ORDER — VANCOMYCIN HCL 10 G IV SOLR
1500.0000 mg | INTRAVENOUS | Status: DC
  Administered 2018-01-03: 1500 mg via INTRAVENOUS
  Filled 2018-01-03: qty 1500

## 2018-01-03 MED ORDER — FUROSEMIDE 10 MG/ML IJ SOLN
20.0000 mg | Freq: Once | INTRAMUSCULAR | Status: AC
  Administered 2018-01-03: 20 mg via INTRAVENOUS
  Filled 2018-01-03: qty 2

## 2018-01-03 NOTE — Consult Note (Signed)
Date: 01/03/2018                  Patient Name:  [name]  MRN: [id number]  DOB: July 31, 1940  Age / Sex: 77 y.o., female         PCP: Lemmie Evens, MD                 Service Requesting Consult: IM/ Henreitta Leber, MD                 Reason for Consult: CKD            History of Present Illness: Patient is a 77 y.o. female with medical problems of CAD, PVD, who was admitted to [facility] on 01/02/2018 for evaluation of left leg wound.  Patient has chronic kidney disease and is followed by nephrologist in [facility] Her baseline creatinine appears to be 1.87 from December 20, 2017 Admit creatinine was 2.94 which has improved to 2.26 today Patient does not have any acute complaints.  Denies any blood in the stool or urine Appetite has been fair No shortness of breath   Medications: Outpatient medications: Medications Prior to Admission  Medication Sig Dispense Refill Last Dose  . acetaminophen (TYLENOL) 500 MG tablet Take 500-1,000 mg by mouth every 6 (six) hours as needed for mild pain or moderate pain.   PRN at PRN  . albuterol (PROVENTIL HFA;VENTOLIN HFA) 108 (90 Base) MCG/ACT inhaler Inhale 2 puffs into the lungs every 4 (four) hours as needed for wheezing or shortness of breath.   PRN at PRN  . albuterol (PROVENTIL) (2.5 MG/3ML) 0.083% nebulizer solution Take 2.5 mg by nebulization every 6 (six) hours as needed for shortness of breath.    PRN at PRN  . amiodarone (PACERONE) 200 MG tablet TAKE ONE (1) TABLET EACH DAY (Patient taking differently: Take 200 mg by mouth daily. ) 90 tablet 3 01/02/2018 at 0930  . Ascorbic Acid (VITAMIN C) 1000 MG tablet Take 1,000 mg by mouth daily.   01/02/2018 at am  . calcitRIOL (ROCALTROL) 0.5 MCG capsule Take 0.5 mcg by mouth daily.   01/02/2018 at am  . cetirizine (ZYRTEC) 10 MG tablet Take 10 mg by mouth daily as needed for allergies.   PRN at PRN  . cholecalciferol (VITAMIN D) 1000 units tablet Take 2,000 Units by mouth daily.   01/02/2018 at am  .  clonazePAM (KLONOPIN) 1 MG tablet Take 0.5-1.5 mg by mouth 4 (four) times daily. Takes 0.5mg  three times a day and takes 1.5mg  at bedtime    01/02/2018 at am  . ezetimibe (ZETIA) 10 MG tablet Take 10 mg by mouth daily.   01/02/2018 at am  . Fluticasone-Salmeterol (ADVAIR) 500-50 MCG/DOSE AEPB Inhale 1 puff into the lungs 2 (two) times daily.   01/02/2018 at am  . furosemide (LASIX) 40 MG tablet Take 40 mg by mouth 2 (two) times daily.   01/02/2018 at am  . hydrALAZINE (APRESOLINE) 50 MG tablet Take 1 tablet (50 mg total) by mouth 3 (three) times daily. 90 tablet 7 01/02/2018 at am  . isosorbide mononitrate (IMDUR) 60 MG 24 hr tablet TAKE ONE TABLET (60MG  TOTAL) BY MOUTH TWO TIMES DAILY 60 tablet 3 01/02/2018 at 0930  . levothyroxine (SYNTHROID, LEVOTHROID) 125 MCG tablet Take 125 mcg by mouth daily.   01/02/2018 at am  . metoprolol tartrate (LOPRESSOR) 25 MG tablet Take 25 mg by mouth 2 (two) times daily.   01/02/2018 at 0930  . nitroGLYCERIN (  NITROLINGUAL) 0.4 MG/SPRAY spray Place 1 spray under the tongue every 5 (five) minutes x 3 doses as needed for chest pain. 12 g 3 PRN at PRN  . omega-3 acid ethyl esters (LOVAZA) 1 G capsule Take 2 g by mouth 2 (two) times daily.     01/02/2018 at am  . ranitidine (ZANTAC) 150 MG tablet Take 150 mg by mouth daily as needed for heartburn.   PRN at PRN  . vitamin B-12 (CYANOCOBALAMIN) 1000 MCG tablet Take 1,000 mcg by mouth 2 (two) times daily.   01/02/2018 at am  . dabigatran (PRADAXA) 150 MG CAPS capsule Take 1 capsule (150 mg total) by mouth 2 (two) times daily. 180 capsule 3 Past week at unknown    Current medications: Current Facility-Administered Medications  Medication Dose Route Frequency Provider Last Rate Last Dose  . 0.9 %  sodium chloride infusion   Intravenous Continuous Gladstone Lighter, MD 75 mL/hr at 01/02/18 1900    . acetaminophen (TYLENOL) tablet 500-1,000 mg  500-1,000 mg Oral Q6H PRN Gladstone Lighter, MD      . albuterol (PROVENTIL) (2.5 MG/3ML) 0.083%  nebulizer solution 2.5 mg  2.5 mg Nebulization Q6H PRN Gladstone Lighter, MD   2.5 mg at 01/03/18 0417  . calcitRIOL (ROCALTROL) capsule 0.5 mcg  0.5 mcg Oral Daily Gladstone Lighter, MD      . cholecalciferol (VITAMIN D) tablet 2,000 Units  2,000 Units Oral Daily Gladstone Lighter, MD      . clonazePAM Bobbye Charleston) tablet 0.5 mg  0.5 mg Oral TID Pernell Dupre, RPH   0.5 mg at 01/03/18 0610   And  . clonazePAM (KLONOPIN) tablet 1.5 mg  1.5 mg Oral QHS Hallaji, Sheema M, RPH   1.5 mg at 01/02/18 2125  . ezetimibe (ZETIA) tablet 10 mg  10 mg Oral Daily Gladstone Lighter, MD      . famotidine (PEPCID) tablet 20 mg  20 mg Oral QHS Gladstone Lighter, MD   20 mg at 01/02/18 2125  . heparin injection 5,000 Units  5,000 Units Subcutaneous Q8H Gladstone Lighter, MD   5,000 Units at 01/03/18 0610  . hydrALAZINE (APRESOLINE) tablet 50 mg  50 mg Oral TID Gladstone Lighter, MD   50 mg at 01/02/18 2125  . isosorbide mononitrate (IMDUR) 24 hr tablet 30 mg  30 mg Oral Daily Gladstone Lighter, MD      . levothyroxine (SYNTHROID, LEVOTHROID) tablet 125 mcg  125 mcg Oral Daily Gladstone Lighter, MD   125 mcg at 01/03/18 0610  . meropenem (MERREM) 1 g in sodium chloride 0.9 % 100 mL IVPB  1 g Intravenous Q12H Sainani, Belia Heman, MD      . mometasone-formoterol [facility]) 200-5 MCG/ACT inhaler 2 puff  2 puff Inhalation BID Gladstone Lighter, MD   2 puff at 01/02/18 2126  . morphine 2 MG/ML injection 2 mg  2 mg Intravenous Q4H PRN Gladstone Lighter, MD      . omega-3 acid ethyl esters (LOVAZA) capsule 2 g  2 g Oral BID Gladstone Lighter, MD   2 g at 01/02/18 2126  . ondansetron (ZOFRAN) tablet 4 mg  4 mg Oral Q6H PRN Gladstone Lighter, MD       Or  . ondansetron (ZOFRAN) injection 4 mg  4 mg Intravenous Q6H PRN Gladstone Lighter, MD      . traMADol Veatrice Bourbon) tablet 50 mg  50 mg Oral Q6H PRN Gladstone Lighter, MD   50 mg at 01/02/18 1846  . vancomycin (VANCOCIN) 1,500 mg in  sodium chloride 0.9 % 500 mL IVPB   1,500 mg Intravenous Q36H Henreitta Leber, MD      . vitamin B-12 (CYANOCOBALAMIN) tablet 1,000 mcg  1,000 mcg Oral BID Gladstone Lighter, MD   1,000 mcg at 01/02/18 2125  . vitamin C (ASCORBIC ACID) tablet 1,000 mg  1,000 mg Oral Daily Gladstone Lighter, MD          Allergies: Allergies  Allergen Reactions  . Ciprofloxacin Hcl Anaphylaxis  . Penicillins Anaphylaxis    Near death experience,  Has patient had a PCN reaction causing immediate rash, facial/tongue/throat swelling, SOB or lightheadedness with hypotension: Yes Has patient had a PCN reaction causing severe rash involving mucus membranes or skin necrosis: No Has patient had a PCN reaction that required hospitalization: Yes Has patient had a PCN reaction occurring within the last 10 years: No If all of the above answers are "NO", then may proceed with Cephalosporin use.  . Amlodipine     Leg swelling  . Ceclor [Cefaclor] Nausea And Vomiting  . Codeine Nausea Only and Other (See Comments)    Cramps, nausea  . Lipitor [Atorvastatin] Other (See Comments)    Myalgias   . Sulfa Antibiotics Rash           Past Medical History: Past Medical History:  Diagnosis Date  . CAD (coronary artery disease)    a. 10/2002 PCI: LAD 71m (2.75x23 Cypher DES); b. 09/2004 Cath: LM nl, LAD 40p, patent stent, D1 30p, LCX 20p, diff dzs throughout, OM1 50p, OM2 60p, OM3 40p, RCA 40p-->Med Rx; c. 10/2012 MV: No ischemia/infarct.  . Carotid artery disease ([place])   . Cervical disc disease   . CKD (chronic kidney disease), stage III ([place])   . COPD (chronic obstructive pulmonary disease) ([place])   . Diastolic dysfunction    a. 03/2017 Echo: Ef 60-65%, Gr2 DD, mild MR, mildly dil LA/RA, mildly to mod dil RV w/ nl fxn, PASP 34mmHg.  Marland Kitchen Dysphagia   . GERD (gastroesophageal reflux disease)   . History of kidney stones   . Hypertension    2002  . Hypertriglyceridemia   . Hypothyroidism   . PAD (peripheral artery disease) ([place])    a. 10/2017 ABI's R =  0.78, L = 0.88. Duplex showed borderline R CFA and SFA dzs->med rx.  Marland Kitchen PAF (paroxysmal atrial fibrillation) ([place])    a. Amio/Pradaxa (CHA2DS2VASc = 6).  Marland Kitchen PONV (postoperative nausea and vomiting)   . Right renal artery stenosis ([place])    a. 09/2017 Renal Artery Duplex: no significant LRA stenosis. RRA <60%.     Past Surgical History: Past Surgical History:  Procedure Laterality Date  . ABDOMINAL HYSTERECTOMY    . AMPUTATION Right 04/12/2014   Procedure: Revision AMPUTATION Right Long DIGIT;  Surgeon: Leanora Cover, MD;  Location: [place];  Service: Orthopedics;  Laterality: Right;  . BALLOON DILATION  05/05/2011   Procedure: BALLOON DILATION;  Surgeon: Rogene Houston, MD;  [facility];  Service: Endoscopy;  Laterality: N/A;  . CARDIAC CATHETERIZATION     with stent 2004  CYPHER 2.75 x 41mm coronary stent in the mid left anterior descending stenotic lesion    Last stress test showed EF of 775  . CARDIOVERSION    . CATARACT EXTRACTION Left 2002  . CATARACT EXTRACTION W/PHACO Right 05/14/2013   Procedure: CATARACT EXTRACTION PHACO AND INTRAOCULAR LENS PLACEMENT RIGHT EYE;  Surgeon: Tonny Branch, MD;  Location: [facility];  Service: Ophthalmology;  Laterality: Right;  CDE:  20.[place] SURGERY    . CHOLECYSTECTOMY    . CORONARY ANGIOPLASTY     1 stent  . ESOPHAGEAL DILATION N/A 04/25/2015   Procedure: ESOPHAGEAL DILATION;  Surgeon: Rogene Houston, MD;  [facility];  Service: Endoscopy;  Laterality: N/A;  . ESOPHAGOGASTRODUODENOSCOPY N/A 04/25/2015   Procedure: ESOPHAGOGASTRODUODENOSCOPY (EGD);  Surgeon: Rogene Houston, MD;  [facility];  Service: Endoscopy;  Laterality: N/A;  1225  . MALONEY DILATION  05/05/2011   Procedure: MALONEY DILATION;  Surgeon: Rogene Houston, MD;  [facility];  Service: Endoscopy;  Laterality: N/A;  . right knee arthroscopy  2010  . SAVORY DILATION  05/05/2011   Procedure: SAVORY DILATION;  Surgeon: Rogene Houston, MD;  [facility];  Service: Endoscopy;  Laterality: N/A;  . SHOULDER SURGERY Right    torn rotator cuff  . TOENAIL EXCISION    . TOTAL ABDOMINAL HYSTERECTOMY W/ BILATERAL SALPINGOOPHORECTOMY       Family History: Family History  Problem Relation Age of Onset  . Diabetes Mother   . Heart failure Mother   . Stroke Mother   . Heart attack Father   . Pancreatic cancer Sister   . Heart disease Sister   . Heart disease Brother   . Hypertension Sister   . Colon cancer Neg Hx      Social History: Social History   Socioeconomic History  . Marital status: Widowed    Spouse name: Not on file  . Number of children: 1  . Years of education: Not on file  . Highest education level: Not on file  Occupational History    Employer: RETIRED  Social Needs  . Financial resource strain: Not on file  . Food insecurity:    Worry: Not on file    Inability: Not on file  . Transportation needs:    Medical: Not on file    Non-medical: Not on file  Tobacco Use  . Smoking status: Current Every Day Smoker    Packs/day: 1.00    Years: 56.00    Pack years: 56.00    Types: Cigarettes  . Smokeless tobacco: Never Used  Substance and Sexual Activity  . Alcohol use: No    Alcohol/week: 0.6 oz    Types: 1 Cans of beer per week  . Drug use: No  . Sexual activity: Never    Comment: one pack a day  Lifestyle  . Physical activity:    Days per week: Not on file    Minutes per session: Not on file  . Stress: Not on file  Relationships  . Social connections:    Talks on phone: Not on file    Gets together: Not on file    Attends religious service: Not on file    Active member of club or organization: Not on file    Attends meetings of clubs or organizations: Not on file    Relationship status: Not on file  . Intimate partner violence:    Fear of current or ex partner: Not on file    Emotionally abused: Not on file    Physically abused: Not on file    Forced sexual  activity: Not on file  Other Topics Concern  . Not on file  Social History Narrative   [place].  She does not routinely exercise.   Has a walker to ambulate  Review of Systems: Gen: No fevers or chills HEENT: No vision or hearing problems CV: No chest pain or shortness of breath Resp: No cough or sputum production GI: Appetite has been good.  No blood in the stool GU : No blood in the urine MS: Ambulatory at home Derm:  Left leg skin infection Psych: No complaints Heme: No complaints Neuro: No complaints Endocrine.  No complaints  Vital Signs: Blood pressure (!) 137/47, pulse (!) 45, temperature 98.5 F (36.9 C), temperature source Oral, resp. rate 16, height 5\' 7"  (1.702 m), weight 98.8 kg (217 lb 12.8 oz), SpO2 95 %.   Intake/Output Summary (Last 24 hours) at 01/03/2018 0918 Last data filed at 01/03/2018 0900 Gross per 24 hour  Intake 1200 ml  Output 1750 ml  Net -550 ml    Weight trends: Filed Weights   01/02/18 1316 01/02/18 1740 01/03/18 0359  Weight: 90.7 kg (200 lb) 98.1 kg (216 lb 4.3 oz) 98.8 kg (217 lb 12.8 oz)    Physical Exam: General:  No acute distress, laying in the bed  HEENT  anicteric, moist oral mucous membranes  Neck:  Supple, no JVD  Lungs:  Bilateral expiratory wheezing  Heart::  Soft heart sounds, no rub  Abdomen:  Soft, nontender  Extremities:  Trace edema  Neurologic:  Alert, oriented, able to answer questions  Skin:  Left leg bandage             Lab results: Basic Metabolic Panel: Recent Labs  Lab 01/02/18 1338 01/03/18 0555  NA 131* 133*  K 4.8 4.5  CL 97* 99  CO2 26 26  GLUCOSE 111* 91  BUN 54* 48*  CREATININE 2.94* 2.26*  CALCIUM 8.7* 8.5*    Liver Function Tests: Recent Labs  Lab 01/02/18 1338  AST 33  ALT 22  ALKPHOS 82  BILITOT 0.8  PROT 6.4*  ALBUMIN 3.3*   No results for input(s): LIPASE, AMYLASE in the last 168 hours. No results for input(s): AMMONIA in  the last 168 hours.  CBC: Recent Labs  Lab 01/02/18 1338 01/03/18 0555  WBC 4.9 4.1  NEUTROABS 3.5  --   HGB 8.5* 7.3*  HCT 25.6* 22.3*  MCV 106.0* 104.8*  PLT 305 265    Cardiac Enzymes: Recent Labs  Lab 01/02/18 1338  TROPONINI 0.03*    BNP: Invalid input(s): POCBNP  CBG: No results for input(s): GLUCAP in the last 168 hours.  Microbiology: Recent Results (from the past 720 hour(s))  Urine Culture     Status: None   Collection Time: 12/18/17 11:40 PM  Result Value Ref Range Status   Specimen Description   Final    URINE, RANDOM Performed at [facility], [address] [place], [place] [address]    Special Requests   Final    NONE Performed at [facility], [address] [place], [place] [address]    Culture   Final    NO GROWTH Performed at [facility], [place] [address], [place], [place] [address]    Report Status 12/20/2017 FINAL  Final  Aerobic/Anaerobic Culture (surgical/deep wound)     Status: None (Preliminary result)   Collection Time: 01/02/18  1:47 PM  Result Value Ref Range Status   Specimen Description   Final    LEG Performed at [facility], [address], [place], [place] [address]    Special Requests PENDING  Incomplete   Gram Stain   Final    FEW WBC  PRESENT, PREDOMINANTLY PMN ABUNDANT GRAM NEGATIVE RODS FEW GRAM POSITIVE COCCI    Culture PENDING  Incomplete   Report Status PENDING  Incomplete     Coagulation Studies: No results for input(s): LABPROT, INR in the last 72 hours.  Urinalysis: No results for input(s): COLORURINE, LABSPEC, PHURINE, GLUCOSEU, HGBUR, BILIRUBINUR, KETONESUR, PROTEINUR, UROBILINOGEN, NITRITE, LEUKOCYTESUR in the last 72 hours.  Invalid input(s): APPERANCEUR      Imaging: Dg Chest 2 View  Result Date: 01/02/2018 CLINICAL DATA:  Shortness of breath. EXAM: CHEST - 2 VIEW COMPARISON:  Radiograph December 19, 2017. FINDINGS: Stable cardiomegaly. Atherosclerosis of  thoracic aorta is noted. No pneumothorax or pleural effusion is noted. No acute pulmonary disease is noted. Bony thorax is unremarkable. IMPRESSION: Stable cardiomegaly.  No acute cardiopulmonary abnormality seen. Aortic Atherosclerosis (ICD10-I70.0). Electronically Signed   By: Marijo Conception, M.D.   On: 01/02/2018 14:38   Dg Tibia/fibula Left  Result Date: 01/02/2018 CLINICAL DATA:  Left leg laceration. EXAM: LEFT TIBIA AND FIBULA - 2 VIEW COMPARISON:  None. FINDINGS: There is no evidence of fracture or other focal bone lesions. Large soft tissue defect is seen medially and posteriorly in the distal left calf consistent with laceration. No radiopaque foreign body is noted. IMPRESSION: No fracture or dislocation is noted. Large soft tissue defect seen medially and posteriorly in distal left calf consistent with laceration. Electronically Signed   By: Marijo Conception, M.D.   On: 01/02/2018 14:40   US Renal  Result Date: 01/02/2018 CLINICAL DATA:  Acute on chronic renal failure. Difficulty urinating. History of previous hysterectomy and cholecystectomy. EXAM: RENAL / URINARY TRACT ULTRASOUND COMPLETE COMPARISON:  Renal ultrasound of July 06, 2016 FINDINGS: Right Kidney: Length: 10.6 cm. The renal cortical echotexture is approximately equal to that of the adjacent liver. There is mild cortical thinning. There is no hydronephrosis. Left Kidney: Length: 10.3 cm. The cortical echogenicity is similar to that of the right kidney. The amount of cortical thinning is somewhat less on the left. There is no hydronephrosis. Bladder: Appears normal for degree of bladder distention. IMPRESSION: Mildly increased renal cortical echotexture bilaterally likely reflects the history of medical renal disease. No hydronephrosis. No acute bladder abnormality. Electronically Signed   By: David  Martinique M.D.   On: 01/02/2018 16:56      Assessment & Plan: Pt is a 77 y.o. Caucasian  female with medical problems of coronary artery  disease, MI in 2004, with subsequent cardiac catheterizations, peripheral vascular disease, chronic kidney disease, COPD on home oxygen, GERD, hypertension, atrial fibrillation, was admitted on 01/02/2018 with worsening left leg wound that started after a fall  1.  Acute renal failure chronic kidney disease stage 4 Baseline creatinine 1.87/GFR 25 from December 20, 2017 Admitting creatinine 2.94, improved to 2.26 today Acute kidney injury is likely multifactorial possibly related to concurrent infection with some component of dehydration that was corrected with IV fluids.  Renal ultrasound shows increased cortical echotexture suggesting chronic kidney disease Management of cellulitis as per primary team Avoid nonsteroidals, IV contrast Avoid hypotension Dose antibiotics for creatinine clearance less than 30 in consultation with pharmacy.  We will follow     LOS: [place] 7/2/20199:18 AM  [place], [place]  Note: This note was prepared with Dragon dictation. Any transcription errors are unintentional

## 2018-01-03 NOTE — Consult Note (Signed)
Cardiology Consultation:   Patient ID: [name]; [id number]; 10-06-1940   Admit date: 01/02/2018 Date of Consult: 01/03/2018  Primary Care Provider: Lemmie Evens, MD Primary Cardiologist: [name]   Patient Profile:   [name] is a 77 y.o. female with a hx of CAD s/p LAD stenting in 2004, HTN, HL, diast dysfxn, PAF previously on Pradaxa, CKD III, persistent anemia, recent mechanical fall, hypothyroidism, obesity, claudication/PAD, and GERD who is being seen today for the evaluation of lower extremity swelling at the request of [name].  History of Present Illness:   [name] cardiac history dates back to 2004 when she underwent LAD stenting (taxus des). Cath in 2006 showed continued patency of LAD stent with otherwise nonobstructive disease. She underwent stress testing in 2014, which was normal. More recently, she has been experiencing R>L lower extremity pain. ABI's performed in 10/2017, showed an ABI of 0.78 on the right with duplex suggesting borderline right CFA and SFA disease. Smoking cessation and a walking program was advised. She was recently admitted to [name] in mid 12/2017 with weakness and fatigue following a mechanical fall with severe trauma to the left lower leg that was managed conservatively with plans for outpatient follow up with possible need for skin grafting. This was followed by a second mechanical fall in her bathroom in the setting of weakness. She was admitted to [name] in mid 12/2017 in this setting and noted to have a mildly elevated troponin that was felt to be demand ischemia. She was noted to be anemic with a HGB in the 8-range. Her Pradaxa was held in this setting.   She returned to [name] on 7/2 in the setting of worsening drainage from her leg wound with associated weakness and lower extremity swelling. Labs showed a worsening HGB to 7.3. Renal function showed a BUN of 54, SCr 2.94 with a baseline ~ 2.3. Troponin was mildly elevated at 0.03,  albumin 3.3. CXR showed stable cardiomegaly without acute process. Cardiology was asked to evaluate her lower extremity swelling and SOB.   Past Medical History:  Diagnosis Date  . CAD (coronary artery disease)    a. 10/2002 PCI: LAD 61m (2.75x23 Cypher DES); b. 09/2004 Cath: LM nl, LAD 40p, patent stent, D1 30p, LCX 20p, diff dzs throughout, OM1 50p, OM2 60p, OM3 40p, RCA 40p-->Med Rx; c. 10/2012 MV: No ischemia/infarct.  . Carotid artery disease ([place])   . Cervical disc disease   . CKD (chronic kidney disease), stage III ([place])   . COPD (chronic obstructive pulmonary disease) ([place])   . Diastolic dysfunction    a. 03/2017 Echo: Ef 60-65%, Gr2 DD, mild MR, mildly dil LA/RA, mildly to mod dil RV w/ nl fxn, PASP 25mmHg.  Marland Kitchen Dysphagia   . GERD (gastroesophageal reflux disease)   . History of kidney stones   . Hypertension    2002  . Hypertriglyceridemia   . Hypothyroidism   . PAD (peripheral artery disease) ([place])    a. 10/2017 ABI's R = 0.78, L = 0.88. Duplex showed borderline R CFA and SFA dzs->med rx.  Marland Kitchen PAF (paroxysmal atrial fibrillation) ([place])    a. Amio/Pradaxa (CHA2DS2VASc = 6).  Marland Kitchen PONV (postoperative nausea and vomiting)   . Right renal artery stenosis ([place])    a. 09/2017 Renal Artery Duplex: no significant LRA stenosis. RRA <60%.    Past Surgical History:  Procedure Laterality Date  . ABDOMINAL HYSTERECTOMY    . AMPUTATION Right 04/12/2014   Procedure: Revision AMPUTATION  Right Long DIGIT;  Surgeon: Kim Cover, MD;  Location: [place];  Service: Orthopedics;  Laterality: Right;  . BALLOON DILATION  05/05/2011   Procedure: BALLOON DILATION;  Surgeon: Kim Houston, MD;  [facility];  Service: Endoscopy;  Laterality: N/A;  . CARDIAC CATHETERIZATION     with stent 2004  CYPHER 2.75 x 58mm coronary stent in the mid left anterior descending stenotic lesion    Last stress test showed EF of 775  . CARDIOVERSION    . CATARACT EXTRACTION Left 2002  . CATARACT EXTRACTION W/PHACO  Right 05/14/2013   Procedure: CATARACT EXTRACTION PHACO AND INTRAOCULAR LENS PLACEMENT RIGHT EYE;  Surgeon: Kim Branch, MD;  Location: [facility];  Service: Ophthalmology;  Laterality: Right;  CDE:  20.45  . CERVICAL DISC SURGERY    . CHOLECYSTECTOMY    . CORONARY ANGIOPLASTY     1 stent  . ESOPHAGEAL DILATION N/A 04/25/2015   Procedure: ESOPHAGEAL DILATION;  Surgeon: Kim Houston, MD;  [facility];  Service: Endoscopy;  Laterality: N/A;  . ESOPHAGOGASTRODUODENOSCOPY N/A 04/25/2015   Procedure: ESOPHAGOGASTRODUODENOSCOPY (EGD);  Surgeon: Kim Houston, MD;  [facility];  Service: Endoscopy;  Laterality: N/A;  1225  . MALONEY DILATION  05/05/2011   Procedure: MALONEY DILATION;  Surgeon: Kim Houston, MD;  [facility];  Service: Endoscopy;  Laterality: N/A;  . right knee arthroscopy  2010  . SAVORY DILATION  05/05/2011   Procedure: SAVORY DILATION;  Surgeon: Kim Houston, MD;  [facility];  Service: Endoscopy;  Laterality: N/A;  . SHOULDER SURGERY Right    torn rotator cuff  . TOENAIL EXCISION    . TOTAL ABDOMINAL HYSTERECTOMY W/ BILATERAL SALPINGOOPHORECTOMY       Home Meds: Prior to Admission medications   Medication Sig Start Date End Date Taking? Authorizing Provider  acetaminophen (TYLENOL) 500 MG tablet Take 500-1,000 mg by mouth every 6 (six) hours as needed for mild pain or moderate pain.   Yes [provider]  albuterol (PROVENTIL HFA;VENTOLIN HFA) 108 (90 Base) MCG/ACT inhaler Inhale 2 puffs into the lungs every 4 (four) hours as needed for wheezing or shortness of breath.   Yes [provider]  albuterol (PROVENTIL) (2.5 MG/3ML) 0.083% nebulizer solution Take 2.5 mg by nebulization every 6 (six) hours as needed for shortness of breath.    Yes [provider]  amiodarone (PACERONE) 200 MG tablet TAKE ONE (1) TABLET EACH DAY Patient taking differently: Take 200 mg by mouth daily.  02/23/17  Yes Kim Merritts, MD  Ascorbic Acid (VITAMIN C) 1000 MG tablet Take 1,000 mg by mouth daily.   Yes [provider]  calcitRIOL (ROCALTROL) 0.5 MCG capsule Take 0.5 mcg by mouth daily. 01/06/17  Yes [provider]  cetirizine (ZYRTEC) 10 MG tablet Take 10 mg by mouth daily as needed for allergies.   Yes [provider]  cholecalciferol (VITAMIN D) 1000 units tablet Take 2,000 Units by mouth daily.   Yes [provider]  clonazePAM (KLONOPIN) 1 MG tablet Take 0.5-1.5 mg by mouth 4 (four) times daily. Takes 0.5mg  three times a day and takes 1.5mg  at bedtime    Yes [provider]  ezetimibe (ZETIA) 10 MG tablet Take 10 mg by mouth daily.   Yes [provider]  Fluticasone-Salmeterol (ADVAIR) 500-50 MCG/DOSE AEPB Inhale 1 puff into the lungs 2 (two) times daily.   Yes [provider]  furosemide (LASIX)  40 MG tablet Take 40 mg by mouth 2 (two) times daily. 01/06/17  Yes [provider]  hydrALAZINE (APRESOLINE) 50 MG tablet Take 1 tablet (50 mg total) by mouth 3 (three) times daily. 02/22/17  Yes Kim Merritts, MD  isosorbide mononitrate (IMDUR) 60 MG 24 hr tablet TAKE ONE TABLET (60MG  TOTAL) BY MOUTH TWO TIMES DAILY 12/29/17  Yes Gollan, Kathlene November, MD  levothyroxine (SYNTHROID, LEVOTHROID) 125 MCG tablet Take 125 mcg by mouth daily.   Yes [provider]  metoprolol tartrate (LOPRESSOR) 25 MG tablet Take 25 mg by mouth 2 (two) times daily.   Yes [provider]  nitroGLYCERIN (NITROLINGUAL) 0.4 MG/SPRAY spray Place 1 spray under the tongue every 5 (five) minutes x 3 doses as needed for chest pain. 07/27/17  Yes Gollan, Kathlene November, MD  omega-3 acid ethyl esters (LOVAZA) 1 G capsule Take 2 g by mouth 2 (two) times daily.     Yes [provider]  ranitidine (ZANTAC) 150 MG tablet Take 150 mg by mouth daily as needed for heartburn.   Yes [provider]  vitamin B-12 (CYANOCOBALAMIN) 1000 MCG tablet Take 1,000  mcg by mouth 2 (two) times daily.   Yes [provider]  dabigatran (PRADAXA) 150 MG CAPS capsule Take 1 capsule (150 mg total) by mouth 2 (two) times daily. 04/29/15   Kim Merritts, MD    Inpatient Medications: Scheduled Meds: . sodium chloride   Intravenous Once  . budesonide (PULMICORT) nebulizer solution  0.5 mg Nebulization BID  . calcitRIOL  0.5 mcg Oral Daily  . cholecalciferol  2,000 Units Oral Daily  . clonazePAM  0.5 mg Oral TID   And  . clonazePAM  1.5 mg Oral QHS  . ezetimibe  10 mg Oral Daily  . famotidine  20 mg Oral QHS  . heparin  5,000 Units Subcutaneous Q8H  . hydrALAZINE  50 mg Oral TID  . ipratropium-albuterol  3 mL Nebulization Q6H  . isosorbide mononitrate  30 mg Oral Daily  . levothyroxine  125 mcg Oral Daily  . mometasone-formoterol  2 puff Inhalation BID  . omega-3 acid ethyl esters  2 g Oral BID  . vitamin B-12  1,000 mcg Oral BID  . vitamin C  1,000 mg Oral Daily   Continuous Infusions: . sodium chloride 75 mL/hr at 01/03/18 1010  . meropenem (MERREM) IV    . vancomycin 1,500 mg (01/03/18 1000)   PRN Meds: acetaminophen, morphine injection, ondansetron **OR** ondansetron (ZOFRAN) IV, traMADol  Allergies:   Allergies  Allergen Reactions  . Ciprofloxacin Hcl Anaphylaxis  . Penicillins Anaphylaxis    Near death experience,  Has patient had a PCN reaction causing immediate rash, facial/tongue/throat swelling, SOB or lightheadedness with hypotension: Yes Has patient had a PCN reaction causing severe rash involving mucus membranes or skin necrosis: No Has patient had a PCN reaction that required hospitalization: Yes Has patient had a PCN reaction occurring within the last 10 years: No If all of the above answers are "NO", then may proceed with Cephalosporin use.  . Amlodipine     Leg swelling  . Ceclor [Cefaclor] Nausea And Vomiting  . Codeine Nausea Only and Other (See Comments)    Cramps, nausea  . Lipitor [Atorvastatin] Other (See  Comments)    Myalgias   . Sulfa Antibiotics Rash         Social History:   Social History   Socioeconomic History  . Marital status: Widowed    Spouse  name: Not on file  . Number of children: 1  . Years of education: Not on file  . Highest education level: Not on file  Occupational History    Employer: RETIRED  Social Needs  . Financial resource strain: Not on file  . Food insecurity:    Worry: Not on file    Inability: Not on file  . Transportation needs:    Medical: Not on file    Non-medical: Not on file  Tobacco Use  . Smoking status: Current Every Day Smoker    Packs/day: 1.00    Years: 56.00    Pack years: 56.00    Types: Cigarettes  . Smokeless tobacco: Never Used  Substance and Sexual Activity  . Alcohol use: No    Alcohol/week: 0.6 oz    Types: 1 Cans of beer per week  . Drug use: No  . Sexual activity: Never    Comment: one pack a day  Lifestyle  . Physical activity:    Days per week: Not on file    Minutes per session: Not on file  . Stress: Not on file  Relationships  . Social connections:    Talks on phone: Not on file    Gets together: Not on file    Attends religious service: Not on file    Active member of club or organization: Not on file    Attends meetings of clubs or organizations: Not on file    Relationship status: Not on file  . Intimate partner violence:    Fear of current or ex partner: Not on file    Emotionally abused: Not on file    Physically abused: Not on file    Forced sexual activity: Not on file  Other Topics Concern  . Not on file  Social History Narrative   [place]  She does not routinely exercise.   Has a walker to ambulate     Family History:   Family History  Problem Relation Age of Onset  . Diabetes Mother   . Heart failure Mother   . Stroke Mother   . Heart attack Father   . Pancreatic cancer Sister   . Heart disease Sister   . Heart disease Brother   .  Hypertension Sister   . Colon cancer Neg Hx     ROS:  Review of Systems  Constitutional: Positive for malaise/fatigue. Negative for chills, diaphoresis, fever and weight loss.  HENT: Negative for congestion.   Eyes: Negative for discharge and redness.  Respiratory: Positive for shortness of breath and wheezing. Negative for cough, hemoptysis and sputum production.   Cardiovascular: Positive for orthopnea and leg swelling. Negative for chest pain, palpitations, claudication and PND.  Gastrointestinal: Negative for abdominal pain, blood in stool, heartburn, melena, nausea and vomiting.  Genitourinary: Negative for hematuria.  Musculoskeletal: Negative for falls and myalgias.  Skin: Negative for rash.  Neurological: Positive for weakness. Negative for dizziness, tingling, tremors, sensory change, speech change, focal weakness and loss of consciousness.  Endo/Heme/Allergies: Does not bruise/bleed easily.  Psychiatric/Behavioral: Negative for substance abuse. The patient is not nervous/anxious.   All other systems reviewed and are negative.     Physical Exam/Data:   Vitals:   01/02/18 2131 01/03/18 0359 01/03/18 0417 01/03/18 0837  BP: (!) 142/40 (!) 124/42  (!) 137/47  Pulse: (!) 46 (!) 50  (!) 45  Resp:  18  16  Temp:  98.4 F (36.9 C)  98.5 F (36.9 C)  TempSrc:  Oral  Oral  SpO2:  97% 96% 95%  Weight:  217 lb 12.8 oz (98.8 kg)    Height:        Intake/Output Summary (Last 24 hours) at 01/03/2018 1201 Last data filed at 01/03/2018 0900 Gross per 24 hour  Intake 1320 ml  Output 1750 ml  Net -430 ml   Filed Weights   01/02/18 1316 01/02/18 1740 01/03/18 0359  Weight: 200 lb (90.7 kg) 216 lb 4.3 oz (98.1 kg) 217 lb 12.8 oz (98.8 kg)   Body mass index is 34.11 kg/m.   Physical Exam: General: Chronically ill appearing, in no acute distress. Head: Normocephalic, atraumatic, sclera non-icteric, no xanthomas, nares without discharge.  Neck: Negative for carotid bruits. JVD  elevated to the angle of the mandible. Lungs: Diminished breath sounds with diffuse wheezing. Breathing is unlabored. Heart: RRR with S1 S2. Distant heart sounds with a II/VI systolic murmur RUSB, no rubs, or gallops appreciated. Abdomen: Soft, non-tender, non-distended with normoactive bowel sounds. No hepatomegaly. No rebound/guarding. No obvious abdominal masses. Msk:  Strength and tone appear normal for age. Extremities: Left lower extremity dressed. 1+ bilateral thigh pitting edema along with right tibial edema.  Neuro: Somnolent. No facial asymmetry. No focal deficit. Moves all extremities spontaneously. Psych:  Responds to questions appropriately with a normal affect.   EKG:  The EKG was personally reviewed and demonstrates: marked sinus bradycardia with sinus arrhythmia, 42 bpm, low voltage QRS, nonspecific st/t changes Telemetry:  Telemetry was personally reviewed and demonstrates: sinus bradycardia to NSR  Weights: Filed Weights   01/02/18 1316 01/02/18 1740 01/03/18 0359  Weight: 200 lb (90.7 kg) 216 lb 4.3 oz (98.1 kg) 217 lb 12.8 oz (98.8 kg)    Relevant CV Studies: Echo 12/20/2017: Study Conclusions  - Left ventricle: The cavity size was mildly dilated. Wall   thickness was increased in a pattern of mild LVH. Systolic   function was vigorous. The estimated ejection fraction was in the   range of 65% to 70%. Features are consistent with a pseudonormal   left ventricular filling pattern, with concomitant abnormal   relaxation and increased filling pressure (grade 2 diastolic   dysfunction). Doppler parameters are consistent with high   ventricular filling pressure. - Left atrium: The atrium was mildly dilated. - Right ventricle: The cavity size was mildly dilated. Systolic   function was normal.  Laboratory Data:  Chemistry Recent Labs  Lab 01/02/18 1338 01/03/18 0555  NA 131* 133*  K 4.8 4.5  CL 97* 99  CO2 26 26  GLUCOSE 111* 91  BUN 54* 48*  CREATININE  2.94* 2.26*  CALCIUM 8.7* 8.5*  GFRNONAA 14* 20*  GFRAA 17* 23*  ANIONGAP 8 8    Recent Labs  Lab 01/02/18 1338  PROT 6.4*  ALBUMIN 3.3*  AST 33  ALT 22  ALKPHOS 82  BILITOT 0.8   Hematology Recent Labs  Lab 01/02/18 1338 01/03/18 0555  WBC 4.9 4.1  RBC 2.42*  2.37* 2.13*  HGB 8.5* 7.3*  HCT 25.6* 22.3*  MCV 106.0* 104.8*  MCH 35.1* 34.5*  MCHC 33.2 32.9  RDW 16.1* 15.7*  PLT 305 265   Cardiac Enzymes Recent Labs  Lab 01/02/18 1338  TROPONINI 0.03*   No results for input(s): TROPIPOC in the last 168 hours.  BNPNo results for input(s): BNP, PROBNP in the last 168 hours.  DDimer No results for input(s): DDIMER in the last 168 hours.  Radiology/Studies:  Dg Chest 2 View  Result Date: 01/02/2018 IMPRESSION: Stable cardiomegaly.  No acute cardiopulmonary abnormality seen. Aortic Atherosclerosis (ICD10-I70.0). Electronically Signed   By: Marijo Conception, M.D.   On: 01/02/2018 14:38   Dg Tibia/fibula Left  Result Date: 01/02/2018 IMPRESSION: No fracture or dislocation is noted. Large soft tissue defect seen medially and posteriorly in distal left calf consistent with laceration. Electronically Signed   By: Marijo Conception, M.D.   On: 01/02/2018 14:40   US Renal  Result Date: 01/02/2018 IMPRESSION: Mildly increased renal cortical echotexture bilaterally likely reflects the history of medical renal disease. No hydronephrosis. No acute bladder abnormality. Electronically Signed   By: David  Martinique M.D.   On: 01/02/2018 16:56    Assessment and Plan:   1. 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 She has objective findings on labs that would indicate she is dry, though does have lower extremity swelling, diminished breath sounds, and elevated JVD. Recommend at least 1 unit of pRBC with diuresis. Recent echo as above, no need to repeat. This is not a CHF exacerbation admission.   2. PAF: Bradycardic, metoprolol  and amiodarone held. Praxada held given anemia.   3. Worsening anemia: As above. Needs further evaluation.  4. Necrotic leg wound: Per IM.   5. Acute on CKD stage III: Improving. Renal on board.    For questions or updates, please contact [place] Please consult [email or web address] for contact info under Cardiology/STEMI.   Signed, Christell Faith, PA-C [place] Pager: [phone] 01/03/2018, 12:01 PM

## 2018-01-03 NOTE — Progress Notes (Signed)
[place] [facility] NAME: [name]    MR#:  [id number]  DATE OF BIRTH:  [birth year]  SUBJECTIVE:   Patient presented to the hospital due to worsening shortness of breath and weeping left lower extremity wound consistent with cellulitis.  Patient feeling increasingly weak and lethargic today.  Hemoglobin has trended down.  Shortness of breath has improved since admission and patient continuing diuresis with IV Lasix.  REVIEW OF SYSTEMS:    Review of Systems  Constitutional: Negative for chills and fever.  HENT: Negative for congestion and tinnitus.   Eyes: Negative for blurred vision and double vision.  Respiratory: Positive for shortness of breath. Negative for cough and wheezing.   Cardiovascular: Positive for leg swelling. Negative for chest pain, orthopnea and PND.  Gastrointestinal: Negative for abdominal pain, diarrhea, nausea and vomiting.  Genitourinary: Negative for dysuria and hematuria.  Neurological: Positive for weakness (Generalized). Negative for dizziness, sensory change and focal weakness.  All other systems reviewed and are negative.   Nutrition: Heart Healthy Tolerating Diet: yes Tolerating PT: Await Eval   DRUG ALLERGIES:   Allergies  Allergen Reactions  . Ciprofloxacin Hcl Anaphylaxis  . Penicillins Anaphylaxis    Near death experience,  Has patient had a PCN reaction causing immediate rash, facial/tongue/throat swelling, SOB or lightheadedness with hypotension: Yes Has patient had a PCN reaction causing severe rash involving mucus membranes or skin necrosis: No Has patient had a PCN reaction that required hospitalization: Yes Has patient had a PCN reaction occurring within the last 10 years: No If all of the above answers are "NO", then may proceed with Cephalosporin use.  . Amlodipine     Leg swelling  . Ceclor [Cefaclor] Nausea And Vomiting  . Codeine Nausea Only and Other (See Comments)    Cramps,  nausea  . Lipitor [Atorvastatin] Other (See Comments)    Myalgias   . Sulfa Antibiotics Rash         VITALS:  Blood pressure (!) 127/45, pulse (!) 46, temperature 98.1 F (36.7 C), temperature source Axillary, resp. rate 18, height 5\' 7"  (1.702 m), weight 98.8 kg (217 lb 12.8 oz), SpO2 96 %.  PHYSICAL EXAMINATION:   Physical Exam  GENERAL:  77 y.o.-year-old patient lying in bed in no acute distress.  EYES: Pupils equal, round, reactive to light and accommodation. No scleral icterus. Extraocular muscles intact.  HEENT: Head atraumatic, normocephalic. Oropharynx and nasopharynx clear.  NECK:  Supple, no jugular venous distention. No thyroid enlargement, no tenderness.  LUNGS: Normal breath sounds bilaterally, no wheezing, rales, rhonchi. No use of accessory muscles of respiration.  CARDIOVASCULAR: S1, S2 normal. No murmurs, rubs, or gallops.  ABDOMEN: Soft, nontender, nondistended. Bowel sounds present. No organomegaly or mass.  EXTREMITIES: No cyanosis, clubbing, +1-2 edema b/l.  Left lower ext. Wrapped in kerlex   NEUROLOGIC: Cranial nerves II through XII are intact. No focal Motor or sensory deficits b/l.   PSYCHIATRIC: The patient is alert and oriented x 3.  SKIN: No obvious rash, lesion, LLE ulcer wrapped in kerlex.    LABORATORY PANEL:   CBC Recent Labs  Lab 01/03/18 0555  WBC 4.1  HGB 7.3*  HCT 22.3*  PLT 265   ------------------------------------------------------------------------------------------------------------------  Chemistries  Recent Labs  Lab 01/02/18 1338 01/03/18 0555  NA 131* 133*  K 4.8 4.5  CL 97* 99  CO2 26 26  GLUCOSE 111* 91  BUN 54* 48*  CREATININE 2.94* 2.26*  CALCIUM 8.7* 8.5*  AST 33  --   ALT 22  --   ALKPHOS 82  --   BILITOT 0.8  --    ------------------------------------------------------------------------------------------------------------------  Cardiac Enzymes Recent Labs  Lab 01/02/18 1338  TROPONINI 0.03*    ------------------------------------------------------------------------------------------------------------------  RADIOLOGY:  Dg Chest 2 View  Result Date: 01/02/2018 CLINICAL DATA:  Shortness of breath. EXAM: CHEST - 2 VIEW COMPARISON:  Radiograph December 19, 2017. FINDINGS: Stable cardiomegaly. Atherosclerosis of thoracic aorta is noted. No pneumothorax or pleural effusion is noted. No acute pulmonary disease is noted. Bony thorax is unremarkable. IMPRESSION: Stable cardiomegaly.  No acute cardiopulmonary abnormality seen. Aortic Atherosclerosis (ICD10-I70.0). Electronically Signed   By: Marijo Conception, M.D.   On: 01/02/2018 14:38   Dg Tibia/fibula Left  Result Date: 01/02/2018 CLINICAL DATA:  Left leg laceration. EXAM: LEFT TIBIA AND FIBULA - 2 VIEW COMPARISON:  None. FINDINGS: There is no evidence of fracture or other focal bone lesions. Large soft tissue defect is seen medially and posteriorly in the distal left calf consistent with laceration. No radiopaque foreign body is noted. IMPRESSION: No fracture or dislocation is noted. Large soft tissue defect seen medially and posteriorly in distal left calf consistent with laceration. Electronically Signed   By: Marijo Conception, M.D.   On: 01/02/2018 14:40   US Renal  Result Date: 01/02/2018 CLINICAL DATA:  Acute on chronic renal failure. Difficulty urinating. History of previous hysterectomy and cholecystectomy. EXAM: RENAL / URINARY TRACT ULTRASOUND COMPLETE COMPARISON:  Renal ultrasound of July 06, 2016 FINDINGS: Right Kidney: Length: 10.6 cm. The renal cortical echotexture is approximately equal to that of the adjacent liver. There is mild cortical thinning. There is no hydronephrosis. Left Kidney: Length: 10.3 cm. The cortical echogenicity is similar to that of the right kidney. The amount of cortical thinning is somewhat less on the left. There is no hydronephrosis. Bladder: Appears normal for degree of bladder distention. IMPRESSION: Mildly  increased renal cortical echotexture bilaterally likely reflects the history of medical renal disease. No hydronephrosis. No acute bladder abnormality. Electronically Signed   By: David  Martinique M.D.   On: 01/02/2018 16:56     ASSESSMENT AND PLAN:   [name]  is a 77 y.o. female with a known history of CAD status post PCI in 2004, peripheral vascular disease, CKD stage III, COPD on 2 L home oxygen, GERD, hypertension, A. fib not on anticoagulation due to anemia, anemia of chronic disease, recent fall presents to hospital secondary to worsening weakness and left leg wound with discharge.  1. Necrotic purulent left leg wound with cellulitis-triggered after a trauma and pressure injury. -Status post superficial debridement and wound culture sent by ER -Continue IV vancomycin, Meropenem for now. -Seen by podiatry and plan for debridement tomorrow.  2.  Acute renal failure on CKD stage III-Baseline creatinine seems to be around 2.2, now gradually worsening -Recent admission with creatinine improved up to 1.8, now at 2.9 again -Seen by nephrology.  Creatinine improved with gentle IV fluid hydration.  Renal ultrasound showing no hydronephrosis but medical renal disease. - Continue IV fluids, follow BUN and creatinine urine output.  Renal dose meds, avoid nephrotoxins.  3.  Chronic diastolic heart failure-she has chronic lower extremity edema, but has no pulmonary edema and is presently not hypoxic. -Appreciate cardiology input. And cont. Current care.  Was given IV Lasix in the ER but will hold Oral lasix for now due to Acute on Chronic renal failure.    4.  Atrial fibrillation-chronic A. fib, bradycardic.  Not on any beta-blockers. - appreciate Cards consult and hold Amio for now.    - Pradaxa has been discontinued last admission due to anemia  5.  Anemia of chronic disease-hold Pradaxa.   - pt. Hg. Has trended down and pt. Is symptomatic. Will give 1 unit of blood and follow Hg. Give one  dose of lasix after transfusion.   6.  Hypertension- cont. hydralazine and Imdur  7.    Hypothyroidism-continue Synthroid.  All the records are reviewed and case discussed with Care Management/Social Worker. Management plans discussed with the patient, family and they are in agreement.  CODE STATUS: Full code  DVT Prophylaxis: Hep SQ  TOTAL TIME TAKING CARE OF THIS PATIENT: 30 minutes.   POSSIBLE D/C IN 2-3 DAYS, DEPENDING ON CLINICAL CONDITION.   Henreitta Leber M.D on 01/03/2018 at 4:05 PM  Between 7am to 6pm - Pager - [phone]  After 6pm go to [email or web address] - Proofreader  [facility]  Office  [phone]  CC: Primary care physician; Lemmie Evens, MD

## 2018-01-03 NOTE — Consult Note (Signed)
[facility] Vascular Consult Note  MRN : [id number]  [name] is a 77 y.o. ([birth year]) female who presents with chief complaint of  Chief Complaint  Patient presents with  . Wound Infection  . Shortness of Breath  .  History of Present Illness:  I am asked to evaluate the patient by Dr. Vickki Muff.  Patient is a 77 year old woman with multiple medical problems who was admitted to the hospital yesterday secondary to increasing lethargy and worsening of a left leg wound.  The patient relates that approximately 2 weeks ago she had a fall she was seen at an outside institution where wound care was rendered.  Over the course of the ensuing 2 weeks her wound has worsened there is been increasing purulent discharge.  At the time of evaluation in the emergency room she was noted to have frank necrotic tissue at the dressing site.  There is no documentation of fever but she does complain of chills on admission.  This evening she no longer stating that she is having any chills.  No nausea vomiting.  She also is noting a tremendous worsening of her lower extremity swelling over the baseline chronic edema that she struggles with.  This evening she is denying pain at the wound site or in either of her feet.  As a confounding problem her baseline renal dysfunction with a creatinine of approximately 2.2 is significantly worsened on this admission.  Current Facility-Administered Medications  Medication Dose Route Frequency Provider Last Rate Last Dose  . 0.9 %  sodium chloride infusion   Intravenous Continuous Gladstone Lighter, MD 75 mL/hr at 01/03/18 1010    . acetaminophen (TYLENOL) tablet 500-1,000 mg  500-1,000 mg Oral Q6H PRN Gladstone Lighter, MD      . budesonide (PULMICORT) nebulizer solution 0.5 mg  0.5 mg Nebulization BID Henreitta Leber, MD   0.5 mg at 01/03/18 1330  . calcitRIOL (ROCALTROL) capsule 0.5 mcg  0.5 mcg Oral Daily Gladstone Lighter, MD   0.5 mcg at  01/03/18 1012  . cholecalciferol (VITAMIN D) tablet 2,000 Units  2,000 Units Oral Daily Gladstone Lighter, MD   2,000 Units at 01/03/18 1012  . clonazePAM (KLONOPIN) tablet 0.5 mg  0.5 mg Oral TID Pernell Dupre, RPH   0.5 mg at 01/03/18 1220   And  . clonazePAM (KLONOPIN) tablet 1.5 mg  1.5 mg Oral QHS Hallaji, Sheema M, RPH   1.5 mg at 01/02/18 2125  . ezetimibe (ZETIA) tablet 10 mg  10 mg Oral Daily Gladstone Lighter, MD   10 mg at 01/03/18 1014  . famotidine (PEPCID) tablet 20 mg  20 mg Oral QHS Gladstone Lighter, MD   20 mg at 01/03/18 1809  . heparin injection 5,000 Units  5,000 Units Subcutaneous Q8H Gladstone Lighter, MD   5,000 Units at 01/03/18 1411  . hydrALAZINE (APRESOLINE) tablet 50 mg  50 mg Oral TID Gladstone Lighter, MD   50 mg at 01/03/18 1013  . ipratropium-albuterol (DUONEB) 0.5-2.5 (3) MG/3ML nebulizer solution 3 mL  3 mL Nebulization Q6H Sainani, Belia Heman, MD   3 mL at 01/03/18 1330  . isosorbide mononitrate (IMDUR) 24 hr tablet 30 mg  30 mg Oral Daily Gladstone Lighter, MD   30 mg at 01/03/18 1014  . levothyroxine (SYNTHROID, LEVOTHROID) tablet 125 mcg  125 mcg Oral Daily Gladstone Lighter, MD   125 mcg at 01/03/18 0610  . meropenem (MERREM) 1 g in sodium chloride 0.9 % 100 mL IVPB  1 g Intravenous Q12H Henreitta Leber, MD 200 mL/hr at 01/03/18 1806 1 g at 01/03/18 1806  . mometasone-formoterol (DULERA) 200-5 MCG/ACT inhaler 2 puff  2 puff Inhalation BID Gladstone Lighter, MD   2 puff at 01/03/18 0900  . morphine 2 MG/ML injection 2 mg  2 mg Intravenous Q4H PRN Gladstone Lighter, MD      . omega-3 acid ethyl esters (LOVAZA) capsule 2 g  2 g Oral BID Gladstone Lighter, MD   2 g at 01/03/18 1013  . ondansetron (ZOFRAN) tablet 4 mg  4 mg Oral Q6H PRN Gladstone Lighter, MD       Or  . ondansetron (ZOFRAN) injection 4 mg  4 mg Intravenous Q6H PRN Gladstone Lighter, MD      . traMADol Veatrice Bourbon) tablet 50 mg  50 mg Oral Q6H PRN Gladstone Lighter, MD   50 mg at  01/03/18 1220  . [START ON 01/04/2018] vancomycin (VANCOCIN) 1,250 mg in sodium chloride 0.9 % 250 mL IVPB  1,250 mg Intravenous Q36H Sainani, Belia Heman, MD      . vitamin B-12 (CYANOCOBALAMIN) tablet 1,000 mcg  1,000 mcg Oral BID Gladstone Lighter, MD   1,000 mcg at 01/03/18 1013  . vitamin C (ASCORBIC ACID) tablet 1,000 mg  1,000 mg Oral Daily Gladstone Lighter, MD   1,000 mg at 01/03/18 1013    Past Medical History:  Diagnosis Date  . CAD (coronary artery disease)    a. 10/2002 PCI: LAD 41m (2.75x23 Cypher DES); b. 09/2004 Cath: LM nl, LAD 40p, patent stent, D1 30p, LCX 20p, diff dzs throughout, OM1 50p, OM2 60p, OM3 40p, RCA 40p-->Med Rx; c. 10/2012 MV: No ischemia/infarct.  . Carotid artery disease ([place])   . Cervical disc disease   . CKD (chronic kidney disease), stage III ([place])   . COPD (chronic obstructive pulmonary disease) ([place])   . Diastolic dysfunction    a. 03/2017 Echo: Ef 60-65%, Gr2 DD, mild MR, mildly dil LA/RA, mildly to mod dil RV w/ nl fxn, PASP 26mmHg.  Marland Kitchen Dysphagia   . GERD (gastroesophageal reflux disease)   . History of kidney stones   . Hypertension    2002  . Hypertriglyceridemia   . Hypothyroidism   . PAD (peripheral artery disease) ([place])    a. 10/2017 ABI's R = 0.78, L = 0.88. Duplex showed borderline R CFA and SFA dzs->med rx.  Marland Kitchen PAF (paroxysmal atrial fibrillation) ([place])    a. Amio/Pradaxa (CHA2DS2VASc = 6).  Marland Kitchen PONV (postoperative nausea and vomiting)   . Right renal artery stenosis ([place])    a. 09/2017 Renal Artery Duplex: no significant LRA stenosis. RRA <60%.    Past Surgical History:  Procedure Laterality Date  . ABDOMINAL HYSTERECTOMY    . AMPUTATION Right 04/12/2014   Procedure: Revision AMPUTATION Right Long DIGIT;  Surgeon: Leanora Cover, MD;  Location: [facility];  Service: Orthopedics;  Laterality: Right;  . BALLOON DILATION  05/05/2011   Procedure: BALLOON DILATION;  Surgeon: Rogene Houston, MD;  [facility];  Service: Endoscopy;  Laterality:  N/A;  . CARDIAC CATHETERIZATION     with stent 2004  CYPHER 2.75 x 53mm coronary stent in the mid left anterior descending stenotic lesion    Last stress test showed EF of 775  . CARDIOVERSION    . CATARACT EXTRACTION Left 2002  . CATARACT EXTRACTION W/PHACO Right 05/14/2013   Procedure: CATARACT EXTRACTION PHACO AND INTRAOCULAR LENS PLACEMENT RIGHT EYE;  Surgeon: Tonny Branch, MD;  Location:  [facility];  Service: Ophthalmology;  Laterality: Right;  CDE:  20.45  . CERVICAL DISC SURGERY    . CHOLECYSTECTOMY    . CORONARY ANGIOPLASTY     1 stent  . ESOPHAGEAL DILATION N/A 04/25/2015   Procedure: ESOPHAGEAL DILATION;  Surgeon: Rogene Houston, MD;  [facility];  Service: Endoscopy;  Laterality: N/A;  . ESOPHAGOGASTRODUODENOSCOPY N/A 04/25/2015   Procedure: ESOPHAGOGASTRODUODENOSCOPY (EGD);  Surgeon: Rogene Houston, MD;  [facility];  Service: Endoscopy;  Laterality: N/A;  1225  . MALONEY DILATION  05/05/2011   Procedure: MALONEY DILATION;  Surgeon: Rogene Houston, MD;  [facility];  Service: Endoscopy;  Laterality: N/A;  . right knee arthroscopy  2010  . SAVORY DILATION  05/05/2011   Procedure: SAVORY DILATION;  Surgeon: Rogene Houston, MD;  [facility];  Service: Endoscopy;  Laterality: N/A;  . SHOULDER SURGERY Right    torn rotator cuff  . TOENAIL EXCISION    . TOTAL ABDOMINAL HYSTERECTOMY W/ BILATERAL SALPINGOOPHORECTOMY      Social History Social History   Tobacco Use  . Smoking status: Current Every Day Smoker    Packs/day: 1.00    Years: 56.00    Pack years: 56.00    Types: Cigarettes  . Smokeless tobacco: Never Used  Substance Use Topics  . Alcohol use: No    Alcohol/week: 0.6 oz    Types: 1 Cans of beer per week  . Drug use: No    Family History Family History  Problem Relation Age of Onset  . Diabetes Mother   . Heart failure Mother   . Stroke Mother   . Heart attack Father   . Pancreatic cancer Sister   . Heart  disease Sister   . Heart disease Brother   . Hypertension Sister   . Colon cancer Neg Hx   No family history of bleeding/clotting disorders, porphyria or autoimmune disease   Allergies  Allergen Reactions  . Ciprofloxacin Hcl Anaphylaxis  . Penicillins Anaphylaxis    Near death experience,  Has patient had a PCN reaction causing immediate rash, facial/tongue/throat swelling, SOB or lightheadedness with hypotension: Yes Has patient had a PCN reaction causing severe rash involving mucus membranes or skin necrosis: No Has patient had a PCN reaction that required hospitalization: Yes Has patient had a PCN reaction occurring within the last 10 years: No If all of the above answers are "NO", then may proceed with Cephalosporin use.  . Amlodipine     Leg swelling  . Ceclor [Cefaclor] Nausea And Vomiting  . Codeine Nausea Only and Other (See Comments)    Cramps, nausea  . Lipitor [Atorvastatin] Other (See Comments)    Myalgias   . Sulfa Antibiotics Rash          REVIEW OF SYSTEMS (Negative unless checked)  Constitutional: [] Weight loss  [] Fever  [] Chills Cardiac: [] Chest pain   [] Chest pressure   [] Palpitations   [] Shortness of breath when laying flat   [] Shortness of breath at rest   [x] Shortness of breath with exertion. Vascular:  [] Pain in legs with walking   [] Pain in legs at rest   [] Pain in legs when laying flat   [] Claudication   [] Pain in feet when walking  [] Pain in feet at rest  [] Pain in feet when laying flat   [] History of DVT   [] Phlebitis   [x] Swelling in legs   [] Varicose veins   [x] Non-healing ulcers Pulmonary:   [] Uses home oxygen   []   Productive cough   [] Hemoptysis   [] Wheeze  [] COPD   [] Asthma Neurologic:  [] Dizziness  [] Blackouts   [] Seizures   [] History of stroke   [] History of TIA  [] Aphasia   [] Temporary blindness   [] Dysphagia   [] Weakness or numbness in arms   [] Weakness or numbness in legs Musculoskeletal:  [] Arthritis   [] Joint swelling   [] Joint pain   [] Low  back pain Hematologic:  [] Easy bruising  [] Easy bleeding   [] Hypercoagulable state   [] Anemic  [] Hepatitis Gastrointestinal:  [] Blood in stool   [] Vomiting blood  [] Gastroesophageal reflux/heartburn   [] Difficulty swallowing. Genitourinary:  [x] Chronic kidney disease   [] Difficult urination  [] Frequent urination  [] Burning with urination   [] Blood in urine Skin:  [x] Rashes   [x] Ulcers   [x] Wounds Psychological:  [] History of anxiety   []  History of major depression.  Physical Examination  Vitals:   01/03/18 1445 01/03/18 1515 01/03/18 1614 01/03/18 1800  BP: (!) 126/41 (!) 127/45 (!) 126/44   Pulse: (!) 50 (!) 46 (!) 46 (!) 51  Resp: 18 18 16 16   Temp: 98.4 F (36.9 C) 98.1 F (36.7 C) 98.1 F (36.7 C) 98.3 F (36.8 C)  TempSrc: Oral Axillary Oral Oral  SpO2: 94% 96% 96% 96%  Weight:      Height:       Body mass index is 34.11 kg/m. Gen:  WD/WN, NAD Head: [place]/AT, No temporalis wasting. Prominent temp pulse not noted. Ear/Nose/Throat: Hearing grossly intact, nares w/o erythema or drainage, oropharynx w/o Erythema/Exudate Eyes: Sclera non-icteric, conjunctiva clear Neck: Trachea midline.  No JVD.  Pulmonary:  Good air movement, respirations not labored, equal bilaterally.  Cardiac: RRR, normal S1, S2. Vascular: 3+ edema of the left leg with severe venous changes of the left leg.  Venous ulcer noted in the lateral ankle area on the left, obviously infected Vessel Right Left  Radial Palpable Palpable  PT  1 + palpable  1 + palpable  DP  2+ palpable  2+ palpable  Gastrointestinal: soft, non-tender/non-distended. No guarding/reflex.  Musculoskeletal: M/S 5/5 throughout.  Extremities without ischemic changes.  No deformity or atrophy. No edema. Neurologic: Sensation grossly intact in extremities.  Symmetrical.  Speech is fluent. Motor exam as listed above. Psychiatric: Judgment intact, Mood & affect appropriate for pt's clinical situation. Dermatologic: Venous stasis dermatitis  bilaterally with ulcers present on the left.  + cellulitis with open wound. Lymph : No Cervical, Axillary, or Inguinal lymphadenopathy.      CBC Lab Results  Component Value Date   WBC 4.1 01/03/2018   HGB 7.3 (L) 01/03/2018   HCT 22.3 (L) 01/03/2018   MCV 104.8 (H) 01/03/2018   PLT 265 01/03/2018    BMET    Component Value Date/Time   NA 133 (L) 01/03/2018 0555   K 4.5 01/03/2018 0555   CL 99 01/03/2018 0555   CO2 26 01/03/2018 0555   GLUCOSE 91 01/03/2018 0555   BUN 48 (H) 01/03/2018 0555   CREATININE 2.26 (H) 01/03/2018 0555   CALCIUM 8.5 (L) 01/03/2018 0555   GFRNONAA 20 (L) 01/03/2018 0555   GFRAA 23 (L) 01/03/2018 0555   Estimated Creatinine Clearance: 25.2 mL/min (A) (by C-G formula based on SCr of 2.26 mg/dL (H)).  COAG No results found for: INR, PROTIME  Radiology Dg Chest 2 View  Result Date: 01/02/2018 CLINICAL DATA:  Shortness of breath. EXAM: CHEST - 2 VIEW COMPARISON:  Radiograph December 19, 2017. FINDINGS: Stable cardiomegaly. Atherosclerosis of thoracic aorta is noted. No pneumothorax  or pleural effusion is noted. No acute pulmonary disease is noted. Bony thorax is unremarkable. IMPRESSION: Stable cardiomegaly.  No acute cardiopulmonary abnormality seen. Aortic Atherosclerosis (ICD10-I70.0). Electronically Signed   By: Marijo Conception, M.D.   On: 01/02/2018 14:38   Dg Tibia/fibula Left  Result Date: 01/02/2018 CLINICAL DATA:  Left leg laceration. EXAM: LEFT TIBIA AND FIBULA - 2 VIEW COMPARISON:  None. FINDINGS: There is no evidence of fracture or other focal bone lesions. Large soft tissue defect is seen medially and posteriorly in the distal left calf consistent with laceration. No radiopaque foreign body is noted. IMPRESSION: No fracture or dislocation is noted. Large soft tissue defect seen medially and posteriorly in distal left calf consistent with laceration. Electronically Signed   By: Marijo Conception, M.D.   On: 01/02/2018 14:40   US Renal  Result  Date: 01/02/2018 CLINICAL DATA:  Acute on chronic renal failure. Difficulty urinating. History of previous hysterectomy and cholecystectomy. EXAM: RENAL / URINARY TRACT ULTRASOUND COMPLETE COMPARISON:  Renal ultrasound of July 06, 2016 FINDINGS: Right Kidney: Length: 10.6 cm. The renal cortical echotexture is approximately equal to that of the adjacent liver. There is mild cortical thinning. There is no hydronephrosis. Left Kidney: Length: 10.3 cm. The cortical echogenicity is similar to that of the right kidney. The amount of cortical thinning is somewhat less on the left. There is no hydronephrosis. Bladder: Appears normal for degree of bladder distention. IMPRESSION: Mildly increased renal cortical echotexture bilaterally likely reflects the history of medical renal disease. No hydronephrosis. No acute bladder abnormality. Electronically Signed   By: David  Martinique M.D.   On: 01/02/2018 16:56   Dg Chest Port 1 View  Result Date: 12/20/2017 CLINICAL DATA:  Cough. EXAM: PORTABLE CHEST 1 VIEW COMPARISON:  CT chest 11/01/2017 FINDINGS: Borderline heart size. No pulmonary vascular congestion, edema, or consolidation. No blunting of costophrenic angles. No pneumothorax. Mediastinal contours appear intact. Calcification of the aorta. Degenerative changes in the right shoulder. Postoperative changes in the lower cervical spine. IMPRESSION: Cardiac enlargement. No evidence of active pulmonary disease. Aortic atherosclerosis. Electronically Signed   By: Lucienne Capers M.D.   On: 12/20/2017 00:36      Assessment/Plan 1.  Venous ulcer left lateral ankle: Patient has been initiated on antibiotics.  At the present time Dr. Vickki Muff is planning to debride her in the operating room place a VAC.  I think this is an excellent plan.  Should the area of tissue destruction track proximally then vascular will become the primary wound service.  She has strongly palpable pedal pulses and thankfully angiography will not be  indicated given the level of the wound and the palpable pulses below this.  Clearly control of her edema will be a significant issue. 2.  Chronic renal insufficiency: The present time the patient is being diuresed.  Nephrology has been consulted. 3.  Coronary artery disease: Patient follows with Dr. Rockey Situ.  No changes in her cardiac medications at this time.   Hortencia Pilar, MD  01/03/2018 6:55 PM    This note was created with Dragon medical transcription system.  Any error is purely unintentional

## 2018-01-03 NOTE — Consult Note (Signed)
ORTHOPAEDIC CONSULTATION  REQUESTING PHYSICIAN: Henreitta Leber, MD  Chief Complaint: Left leg wound  HPI: [name] is a 77 y.o. female who complains of infected left leg wound.  Approximately 2 weeks ago injured her left leg.  Was seen at [facility] and initially sutured.  Had been followed by the wound center outpatient.  Approximately 2 days ago there was a foul odor and worsening drainage from the wound.  Admitted to the hospital yesterday.  Noted drainage at the time.  Consult placed for evaluation of the left leg wound.  Past Medical History:  Diagnosis Date  . CAD (coronary artery disease)    a. 10/2002 PCI: LAD 53m (2.75x23 Cypher DES); b. 09/2004 Cath: LM nl, LAD 40p, patent stent, D1 30p, LCX 20p, diff dzs throughout, OM1 50p, OM2 60p, OM3 40p, RCA 40p-->Med Rx; c. 10/2012 MV: No ischemia/infarct.  . Carotid artery disease ([place])   . Cervical disc disease   . CKD (chronic kidney disease), stage III ([place])   . COPD (chronic obstructive pulmonary disease) ([place])   . Diastolic dysfunction    a. 03/2017 Echo: Ef 60-65%, Gr2 DD, mild MR, mildly dil LA/RA, mildly to mod dil RV w/ nl fxn, PASP 79mmHg.  Marland Kitchen Dysphagia   . GERD (gastroesophageal reflux disease)   . History of kidney stones   . Hypertension    2002  . Hypertriglyceridemia   . Hypothyroidism   . PAD (peripheral artery disease) ([place])    a. 10/2017 ABI's R = 0.78, L = 0.88. Duplex showed borderline R CFA and SFA dzs->med rx.  Marland Kitchen PAF (paroxysmal atrial fibrillation) ([place])    a. Amio/Pradaxa (CHA2DS2VASc = 6).  Marland Kitchen PONV (postoperative nausea and vomiting)   . Right renal artery stenosis ([place])    a. 09/2017 Renal Artery Duplex: no significant LRA stenosis. RRA <60%.   Past Surgical History:  Procedure Laterality Date  . ABDOMINAL HYSTERECTOMY    . AMPUTATION Right 04/12/2014   Procedure: Revision AMPUTATION Right Long DIGIT;  Surgeon: Leanora Cover, MD;  Location: [place];  Service: Orthopedics;  Laterality: Right;  . BALLOON  DILATION  05/05/2011   Procedure: BALLOON DILATION;  Surgeon: Rogene Houston, MD;  [facility];  Service: Endoscopy;  Laterality: N/A;  . CARDIAC CATHETERIZATION     with stent 2004  CYPHER 2.75 x 2mm coronary stent in the mid left anterior descending stenotic lesion    Last stress test showed EF of 775  . CARDIOVERSION    . CATARACT EXTRACTION Left 2002  . CATARACT EXTRACTION W/PHACO Right 05/14/2013   Procedure: CATARACT EXTRACTION PHACO AND INTRAOCULAR LENS PLACEMENT RIGHT EYE;  Surgeon: Tonny Branch, MD;  Location: [facility];  Service: Ophthalmology;  Laterality: Right;  CDE:  20.45  . CERVICAL DISC SURGERY    . CHOLECYSTECTOMY    . CORONARY ANGIOPLASTY     1 stent  . ESOPHAGEAL DILATION N/A 04/25/2015   Procedure: ESOPHAGEAL DILATION;  Surgeon: Rogene Houston, MD;  [facility];  Service: Endoscopy;  Laterality: N/A;  . ESOPHAGOGASTRODUODENOSCOPY N/A 04/25/2015   Procedure: ESOPHAGOGASTRODUODENOSCOPY (EGD);  Surgeon: Rogene Houston, MD;  [facility];  Service: Endoscopy;  Laterality: N/A;  1225  . MALONEY DILATION  05/05/2011   Procedure: MALONEY DILATION;  Surgeon: Rogene Houston, MD;  [facility];  Service: Endoscopy;  Laterality: N/A;  . right knee arthroscopy  2010  . SAVORY DILATION  05/05/2011   Procedure: SAVORY DILATION;  Surgeon:  Rogene Houston, MD;  [facility];  Service: Endoscopy;  Laterality: N/A;  . SHOULDER SURGERY Right    torn rotator cuff  . TOENAIL EXCISION    . TOTAL ABDOMINAL HYSTERECTOMY W/ BILATERAL SALPINGOOPHORECTOMY     Social History   Socioeconomic History  . Marital status: Widowed    Spouse name: Not on file  . Number of children: 1  . Years of education: Not on file  . Highest education level: Not on file  Occupational History    Employer: RETIRED  Social Needs  . Financial resource strain: Not on file  . Food insecurity:    Worry: Not on file    Inability: Not on file  .  Transportation needs:    Medical: Not on file    Non-medical: Not on file  Tobacco Use  . Smoking status: Current Every Day Smoker    Packs/day: 1.00    Years: 56.00    Pack years: 56.00    Types: Cigarettes  . Smokeless tobacco: Never Used  Substance and Sexual Activity  . Alcohol use: No    Alcohol/week: 0.6 oz    Types: 1 Cans of beer per week  . Drug use: No  . Sexual activity: Never    Comment: one pack a day  Lifestyle  . Physical activity:    Days per week: Not on file    Minutes per session: Not on file  . Stress: Not on file  Relationships  . Social connections:    Talks on phone: Not on file    Gets together: Not on file    Attends religious service: Not on file    Active member of club or organization: Not on file    Attends meetings of clubs or organizations: Not on file    Relationship status: Not on file  Other Topics Concern  . Not on file  Social History Narrative   [place].  She does not routinely exercise.   Has a walker to ambulate   Family History  Problem Relation Age of Onset  . Diabetes Mother   . Heart failure Mother   . Stroke Mother   . Heart attack Father   . Pancreatic cancer Sister   . Heart disease Sister   . Heart disease Brother   . Hypertension Sister   . Colon cancer Neg Hx    Allergies  Allergen Reactions  . Ciprofloxacin Hcl Anaphylaxis  . Penicillins Anaphylaxis    Near death experience,  Has patient had a PCN reaction causing immediate rash, facial/tongue/throat swelling, SOB or lightheadedness with hypotension: Yes Has patient had a PCN reaction causing severe rash involving mucus membranes or skin necrosis: No Has patient had a PCN reaction that required hospitalization: Yes Has patient had a PCN reaction occurring within the last 10 years: No If all of the above answers are "NO", then may proceed with Cephalosporin use.  . Amlodipine     Leg swelling  . Ceclor [Cefaclor]  Nausea And Vomiting  . Codeine Nausea Only and Other (See Comments)    Cramps, nausea  . Lipitor [Atorvastatin] Other (See Comments)    Myalgias   . Sulfa Antibiotics Rash        Prior to Admission medications   Medication Sig Start Date End Date Taking? Authorizing Provider  acetaminophen (TYLENOL) 500 MG tablet Take 500-1,000 mg by mouth every 6 (six) hours as needed for mild pain or  moderate pain.   Yes [provider]  albuterol (PROVENTIL HFA;VENTOLIN HFA) 108 (90 Base) MCG/ACT inhaler Inhale 2 puffs into the lungs every 4 (four) hours as needed for wheezing or shortness of breath.   Yes [provider]  albuterol (PROVENTIL) (2.5 MG/3ML) 0.083% nebulizer solution Take 2.5 mg by nebulization every 6 (six) hours as needed for shortness of breath.    Yes [provider]  amiodarone (PACERONE) 200 MG tablet TAKE ONE (1) TABLET EACH DAY Patient taking differently: Take 200 mg by mouth daily.  02/23/17  Yes Minna Merritts, MD  Ascorbic Acid (VITAMIN C) 1000 MG tablet Take 1,000 mg by mouth daily.   Yes [provider]  calcitRIOL (ROCALTROL) 0.5 MCG capsule Take 0.5 mcg by mouth daily. 01/06/17  Yes [provider]  cetirizine (ZYRTEC) 10 MG tablet Take 10 mg by mouth daily as needed for allergies.   Yes [provider]  cholecalciferol (VITAMIN D) 1000 units tablet Take 2,000 Units by mouth daily.   Yes [provider]  clonazePAM (KLONOPIN) 1 MG tablet Take 0.5-1.5 mg by mouth 4 (four) times daily. Takes 0.5mg  three times a day and takes 1.5mg  at bedtime    Yes [provider]  ezetimibe (ZETIA) 10 MG tablet Take 10 mg by mouth daily.   Yes [provider]  Fluticasone-Salmeterol (ADVAIR) 500-50 MCG/DOSE AEPB Inhale 1 puff into the lungs 2 (two) times daily.   Yes [provider]  furosemide (LASIX) 40 MG tablet Take 40 mg by mouth 2 (two) times daily. 01/06/17  Yes [provider]  hydrALAZINE  (APRESOLINE) 50 MG tablet Take 1 tablet (50 mg total) by mouth 3 (three) times daily. 02/22/17  Yes Minna Merritts, MD  isosorbide mononitrate (IMDUR) 60 MG 24 hr tablet TAKE ONE TABLET (60MG  TOTAL) BY MOUTH TWO TIMES DAILY 12/29/17  Yes Gollan, Kathlene November, MD  levothyroxine (SYNTHROID, LEVOTHROID) 125 MCG tablet Take 125 mcg by mouth daily.   Yes [provider]  metoprolol tartrate (LOPRESSOR) 25 MG tablet Take 25 mg by mouth 2 (two) times daily.   Yes [provider]  nitroGLYCERIN (NITROLINGUAL) 0.4 MG/SPRAY spray Place 1 spray under the tongue every 5 (five) minutes x 3 doses as needed for chest pain. 07/27/17  Yes Gollan, Kathlene November, MD  omega-3 acid ethyl esters (LOVAZA) 1 G capsule Take 2 g by mouth 2 (two) times daily.     Yes [provider]  ranitidine (ZANTAC) 150 MG tablet Take 150 mg by mouth daily as needed for heartburn.   Yes [provider]  vitamin B-12 (CYANOCOBALAMIN) 1000 MCG tablet Take 1,000 mcg by mouth 2 (two) times daily.   Yes [provider]  dabigatran (PRADAXA) 150 MG CAPS capsule Take 1 capsule (150 mg total) by mouth 2 (two) times daily. 04/29/15   Minna Merritts, MD   Dg Chest 2 View  Result Date: 01/02/2018 CLINICAL DATA:  Shortness of breath. EXAM: CHEST - 2 VIEW COMPARISON:  Radiograph December 19, 2017. FINDINGS: Stable cardiomegaly. Atherosclerosis of thoracic aorta is noted. No pneumothorax or pleural effusion is noted. No acute pulmonary disease is noted. Bony thorax is unremarkable. IMPRESSION: Stable cardiomegaly.  No acute cardiopulmonary abnormality seen. Aortic Atherosclerosis (ICD10-I70.0). Electronically Signed   By: Marijo Conception, M.D.   On: 01/02/2018 14:38   Dg Tibia/fibula Left  Result Date: 01/02/2018 CLINICAL DATA:  Left leg laceration. EXAM: LEFT TIBIA AND FIBULA - 2 VIEW COMPARISON:  None.  FINDINGS: There is no evidence of fracture or other focal bone lesions. Large soft tissue defect is seen medially  and posteriorly in the distal left calf consistent with laceration. No radiopaque foreign body is noted. IMPRESSION: No fracture or dislocation is noted. Large soft tissue defect seen medially and posteriorly in distal left calf consistent with laceration. Electronically Signed   By: Marijo Conception, M.D.   On: 01/02/2018 14:40   US Renal  Result Date: 01/02/2018 CLINICAL DATA:  Acute on chronic renal failure. Difficulty urinating. History of previous hysterectomy and cholecystectomy. EXAM: RENAL / URINARY TRACT ULTRASOUND COMPLETE COMPARISON:  Renal ultrasound of July 06, 2016 FINDINGS: Right Kidney: Length: 10.6 cm. The renal cortical echotexture is approximately equal to that of the adjacent liver. There is mild cortical thinning. There is no hydronephrosis. Left Kidney: Length: 10.3 cm. The cortical echogenicity is similar to that of the right kidney. The amount of cortical thinning is somewhat less on the left. There is no hydronephrosis. Bladder: Appears normal for degree of bladder distention. IMPRESSION: Mildly increased renal cortical echotexture bilaterally likely reflects the history of medical renal disease. No hydronephrosis. No acute bladder abnormality. Electronically Signed   By: David  Martinique M.D.   On: 01/02/2018 16:56    Positive ROS: All other systems have been reviewed and were otherwise negative with the exception of those mentioned in the HPI and as above.  12 point ROS was performed.  Physical Exam: General: Alert and oriented.  No apparent distress.  Vascular:  Left foot:Dorsalis Pedis:  thready Posterior Tibial:  absent  Right foot: Dorsalis Pedis:  thready Posterior Tibial:  absent  Neuro:intact  Derm: Large 4 cm defect in the left lower leg beneath the calf level.  At the level just distal to the myotendinous junction region.  Exposure of the subcutaneous tissue and deep fascial layer is noted at this time.  Mild surrounding mixed necrotic tissue with fibrotic tissue  noted.  Scant amount of purulence from the wound is noted.  Mild foul odor.  No other ulcerations or wounds to right or left lower extremities.  Ortho/MS: Full range of motion of the ankle and foot.  Guarded with palpation to the left calf area as expected.  X-ray shows the soft tissue defect without fracture.  Assessment: Mildly infected full-thickness left leg wound with mild superficial necrosis.    Plan: This wound will need I&D and debridement.  We will plan to perform debridement tomorrow with possible placement of wound VAC.  Discussed this case with the patient in detail and she has given verbal consent.  We will plan for surgery Wednesday afternoon.  N.p.o. after midnight.  I will consult vascular surgery as this may need a grafting down the road.  If this wound progresses further of the leg will have to transfer care to vascular surgery or general surgery as this would then be outside of my scope of practice.  Currently it is distal to the calf and myotendinous junction region.    Elesa Hacker, DPM Cell [phone]   01/03/2018 8:01 AM

## 2018-01-03 NOTE — Progress Notes (Signed)
Patient has no acute event overnight, she remained in asymptomatic sinus brady in the 40s-50s. Patient has a pre-admission wound on her left calf with dressing from the ED. Patient denied being I pain or any distress. VSS and she afebrile.

## 2018-01-03 NOTE — Progress Notes (Addendum)
Pharmacy Antibiotic Note  [name] is a 77 y.o. female admitted on 01/02/2018 with Wound- purulent , necrotic on L leg.Marland Kitchen  Pharmacy has been consulted for Vancomycin and Meropenem dosing.  Patient at [facility] 6/16-6/18/19 for AKI, fall  NOTE ALLERGIES  Allergies  Allergen Reactions  . Ciprofloxacin Hcl Anaphylaxis  . Penicillins Anaphylaxis    Near death experience,  Has patient had a PCN reaction causing immediate rash, facial/tongue/throat swelling, SOB or lightheadedness with hypotension: Yes Has patient had a PCN reaction causing severe rash involving mucus membranes or skin necrosis: No Has patient had a PCN reaction that required hospitalization: Yes Has patient had a PCN reaction occurring within the last 10 years: No If all of the above answers are "NO", then may proceed with Cephalosporin use.  . Amlodipine     Leg swelling  . Ceclor [Cefaclor] Nausea And Vomiting  . Codeine Nausea Only and Other (See Comments)    Cramps, nausea  . Lipitor [Atorvastatin] Other (See Comments)    Myalgias   . Sulfa Antibiotics Rash         Plan: 01/03/18 07:25 serum creatinine has improved from 2.94 to 2.26 mg/dL, which is about baseline according to H&P. Will check vancomycin random now and if it's less than 20 mcg/ml we will start vancomycin 1.25 gm IV Q36H for wound infection/cellulitis. Vd 53.3 L, Ke 0.025 hr-1, T1/2 27.5 hr, predicted trough 15 mcg/ml.   Will increase meropenem to 1000 mg IV Q12h for Crcl >25 ml/min. Pharmacy note for nursing to monitor patient d/t hx of allergy to PCN.   Height: 5\' 7"  (170.2 cm) Weight: 217 lb 12.8 oz (98.8 kg) IBW/kg (Calculated) : 61.6  Temp (24hrs), Avg:97.8 F (36.6 C), Min:97.6 F (36.4 C), Max:98.4 F (36.9 C)  Recent Labs  Lab 01/02/18 1338 01/03/18 0555  WBC 4.9 4.1  CREATININE 2.94* 2.26*    Estimated Creatinine Clearance: 25.2 mL/min (A) (by C-G formula based on SCr of 2.26 mg/dL (H)).    Allergies  Allergen Reactions  .  Ciprofloxacin Hcl Anaphylaxis  . Penicillins Anaphylaxis    Near death experience,  Has patient had a PCN reaction causing immediate rash, facial/tongue/throat swelling, SOB or lightheadedness with hypotension: Yes Has patient had a PCN reaction causing severe rash involving mucus membranes or skin necrosis: No Has patient had a PCN reaction that required hospitalization: Yes Has patient had a PCN reaction occurring within the last 10 years: No If all of the above answers are "NO", then may proceed with Cephalosporin use.  . Amlodipine     Leg swelling  . Ceclor [Cefaclor] Nausea And Vomiting  . Codeine Nausea Only and Other (See Comments)    Cramps, nausea  . Lipitor [Atorvastatin] Other (See Comments)    Myalgias   . Sulfa Antibiotics Rash         Antimicrobials this admission: Vanc  7/1 >>   Meropenem  7/1 >>    Dose adjustments this admission:    Microbiology results:   BCx:     UCx:      Sputum:      MRSA PCR:   7/1 Wound cx: pending  Thank you for allowing pharmacy to be a part of this patient's care.  Laural Benes, Pharm.D., BCPS Clinical Pharmacist 01/03/2018 7:25 AM

## 2018-01-03 NOTE — Consult Note (Signed)
[place] Nurse wound consult note Patient is in [place].  Podiatry onboard and directing care.  WOC consult not needed for care of left lower leg wound.  Val Riles, RN, MSN, CWOCN, CNS-BC, pager [phone]

## 2018-01-04 DIAGNOSIS — J449 Chronic obstructive pulmonary disease, unspecified: Secondary | ICD-10-CM

## 2018-01-04 LAB — ECHOCARDIOGRAM COMPLETE
Height: 66 in
WEIGHTICAEL: 3388.8 [oz_av]

## 2018-01-04 LAB — TYPE AND SCREEN
ABO/RH(D): B POS
ANTIBODY SCREEN: NEGATIVE
UNIT DIVISION: 0

## 2018-01-04 LAB — BPAM RBC
BLOOD PRODUCT EXPIRATION DATE: 201907172359
ISSUE DATE / TIME: 201907021447
UNIT TYPE AND RH: 7300

## 2018-01-04 LAB — BASIC METABOLIC PANEL
Anion gap: 6 (ref 5–15)
BUN: 40 mg/dL — ABNORMAL HIGH (ref 8–23)
CO2: 26 mmol/L (ref 22–32)
CREATININE: 1.72 mg/dL — AB (ref 0.44–1.00)
Calcium: 8.7 mg/dL — ABNORMAL LOW (ref 8.9–10.3)
Chloride: 102 mmol/L (ref 98–111)
GFR, EST AFRICAN AMERICAN: 32 mL/min — AB (ref >60)
GFR, EST NON AFRICAN AMERICAN: 27 mL/min — AB (ref >60)
Glucose, Bld: 90 mg/dL (ref 70–99)
Potassium: 4.6 mmol/L (ref 3.5–5.1)
Sodium: 134 mmol/L — ABNORMAL LOW (ref 135–145)

## 2018-01-04 LAB — CBC
HEMATOCRIT: 25 % — AB (ref 35.0–47.0)
Hemoglobin: 8.4 g/dL — ABNORMAL LOW (ref 12.0–16.0)
MCH: 34.6 pg — ABNORMAL HIGH (ref 26.0–34.0)
MCHC: 33.8 g/dL (ref 32.0–36.0)
MCV: 102.5 fL — ABNORMAL HIGH (ref 80.0–100.0)
Platelets: 264 10*3/uL (ref 150–440)
RBC: 2.44 MIL/uL — ABNORMAL LOW (ref 3.80–5.20)
RDW: 17.9 % — ABNORMAL HIGH (ref 11.5–14.5)
WBC: 5.7 10*3/uL (ref 3.6–11.0)

## 2018-01-04 MED ORDER — SCOPOLAMINE 1 MG/3DAYS TD PT72
1.0000 | MEDICATED_PATCH | Freq: Once | TRANSDERMAL | Status: DC
  Administered 2018-01-04: 1.5 mg via TRANSDERMAL
  Filled 2018-01-04: qty 1

## 2018-01-04 MED ORDER — FUROSEMIDE 10 MG/ML IJ SOLN
20.0000 mg | Freq: Two times a day (BID) | INTRAMUSCULAR | Status: DC
  Administered 2018-01-04 – 2018-01-06 (×4): 20 mg via INTRAVENOUS
  Filled 2018-01-04 (×4): qty 2

## 2018-01-04 MED ORDER — SCOPOLAMINE 1 MG/3DAYS TD PT72
MEDICATED_PATCH | TRANSDERMAL | Status: AC
  Filled 2018-01-04: qty 1

## 2018-01-04 MED ORDER — VANCOMYCIN HCL IN DEXTROSE 1-5 GM/200ML-% IV SOLN
1000.0000 mg | INTRAVENOUS | Status: DC
  Administered 2018-01-04 – 2018-01-06 (×3): 1000 mg via INTRAVENOUS
  Filled 2018-01-04 (×3): qty 200

## 2018-01-04 MED ORDER — SODIUM CHLORIDE 0.9 % IV SOLN: INTRAVENOUS | Status: DC

## 2018-01-04 NOTE — Anesthesia Preprocedure Evaluation (Addendum)
Anesthesia Evaluation  Patient identified by MRN, date of birth, ID band Patient awake    Reviewed: Allergy & Precautions, NPO status , Patient's Chart, lab work & pertinent test results  History of Anesthesia Complications (+) PONV and history of anesthetic complications  Airway Mallampati: III       Dental   Pulmonary neg sleep apnea, COPD,  COPD inhaler and oxygen dependent, Current Smoker,           Cardiovascular hypertension, Pt. on medications + CAD, + Past MI, + Cardiac Stents, + Peripheral Vascular Disease and +CHF  + dysrhythmias Atrial Fibrillation (-) Valvular Problems/Murmurs     Neuro/Psych neg Seizures    GI/Hepatic Neg liver ROS, GERD  Medicated and Controlled,  Endo/Other  neg diabetesHypothyroidism   Renal/GU Renal InsufficiencyRenal disease     Musculoskeletal   Abdominal   Peds  Hematology   Anesthesia Other Findings   Reproductive/Obstetrics                            Anesthesia Physical Anesthesia Plan  ASA: IV  Anesthesia Plan: General   Post-op Pain Management:    Induction: Intravenous  PONV Risk Score and Plan: 3 and Propofol infusion, TIVA and Midazolam  Airway Management Planned: Simple Face Mask  Additional Equipment:   Intra-op Plan:   Post-operative Plan:   Informed Consent: I have reviewed the patients History and Physical, chart, labs and discussed the procedure including the risks, benefits and alternatives for the proposed anesthesia with the patient or authorized representative who has indicated his/her understanding and acceptance.     Plan Discussed with:   Anesthesia Plan Comments:         Anesthesia Quick Evaluation

## 2018-01-04 NOTE — Progress Notes (Signed)
[facility], [place] 01/04/18  Subjective:   Patient is doing fair today.  No acute complaints Serum creatinine has improved to 1.76 today Scheduled for debridement  Objective:  Vital signs in last 24 hours:  Temp:  [98.1 F (36.7 C)-99 F (37.2 C)] 98.2 F (36.8 C) (07/03 0736) Pulse Rate:  [46-57] 56 (07/03 0736) Resp:  [16-18] 18 (07/03 0736) BP: (126-153)/(41-52) 153/52 (07/03 0736) SpO2:  [92 %-98 %] 96 % (07/03 0736) Weight:  [100.3 kg (221 lb 1.6 oz)] 100.3 kg (221 lb 1.6 oz) (07/03 0427)  Weight change: 9.571 kg (21 lb 1.6 oz) Filed Weights   01/02/18 1740 01/03/18 0359 01/04/18 0427  Weight: 98.1 kg (216 lb 4.3 oz) 98.8 kg (217 lb 12.8 oz) 100.3 kg (221 lb 1.6 oz)    Intake/Output:    Intake/Output Summary (Last 24 hours) at 01/04/2018 1107 Last data filed at 01/04/2018 9562 Gross per 24 hour  Intake 3513 ml  Output 1500 ml  Net 2013 ml    Physical Exam: General:  No acute distress, laying in the bed  HEENT  anicteric, moist oral mucous membranes  Neck:  Supple, no JVD  Lungs:  Bilateral expiratory wheezing-less compared to yesterday  Heart::  Soft heart sounds, no rub  Abdomen:  Soft, nontender  Extremities:  ++ Dependent edema, venous stasis dermatitis  Neurologic:  Alert, oriented, able to answer questions  Skin:  Left leg bandage     Basic Metabolic Panel:  Recent Labs  Lab 01/02/18 1338 01/03/18 0555 01/04/18 0417  NA 131* 133* 134*  K 4.8 4.5 4.6  CL 97* 99 102  CO2 26 26 26   GLUCOSE 111* 91 90  BUN 54* 48* 40*  CREATININE 2.94* 2.26* 1.72*  CALCIUM 8.7* 8.5* 8.7*     CBC: Recent Labs  Lab 01/02/18 1338 01/03/18 0555 01/04/18 0417  WBC 4.9 4.1 5.7  NEUTROABS 3.5  --   --   HGB 8.5* 7.3* 8.4*  HCT 25.6* 22.3* 25.0*  MCV 106.0* 104.8* 102.5*  PLT 305 265 264     No results found for: HEPBSAG, HEPBSAB, HEPBIGM    Microbiology:  Recent Results (from the past 240 hour(s))  Aerobic/Anaerobic  Culture (surgical/deep wound)     Status: None (Preliminary result)   Collection Time: 01/02/18  1:47 PM  Result Value Ref Range Status   Specimen Description   Final    LEG Performed at [facility], [address], [place], [place] [address]    Special Requests PENDING  Incomplete   Gram Stain   Final    FEW WBC PRESENT, PREDOMINANTLY PMN ABUNDANT GRAM NEGATIVE RODS FEW GRAM POSITIVE COCCI    Culture   Final    TOO YOUNG TO READ Performed at [facility], [address], [place], [place] [address]    Report Status PENDING  Incomplete    Coagulation Studies: No results for input(s): LABPROT, INR in the last 72 hours.  Urinalysis: No results for input(s): COLORURINE, LABSPEC, PHURINE, GLUCOSEU, HGBUR, BILIRUBINUR, KETONESUR, PROTEINUR, UROBILINOGEN, NITRITE, LEUKOCYTESUR in the last 72 hours.  Invalid input(s): APPERANCEUR    Imaging: Dg Chest 2 View  Result Date: 01/02/2018 CLINICAL DATA:  Shortness of breath. EXAM: CHEST - 2 VIEW COMPARISON:  Radiograph December 19, 2017. FINDINGS: Stable cardiomegaly. Atherosclerosis of thoracic aorta is noted. No pneumothorax or pleural effusion is noted. No acute pulmonary disease is noted. Bony thorax is unremarkable. IMPRESSION: Stable cardiomegaly.  No acute cardiopulmonary abnormality  seen. Aortic Atherosclerosis (ICD10-I70.0). Electronically Signed   By: Marijo Conception, M.D.   On: 01/02/2018 14:38   Dg Tibia/fibula Left  Result Date: 01/02/2018 CLINICAL DATA:  Left leg laceration. EXAM: LEFT TIBIA AND FIBULA - 2 VIEW COMPARISON:  None. FINDINGS: There is no evidence of fracture or other focal bone lesions. Large soft tissue defect is seen medially and posteriorly in the distal left calf consistent with laceration. No radiopaque foreign body is noted. IMPRESSION: No fracture or dislocation is noted. Large soft tissue defect seen medially and posteriorly in distal left calf consistent with laceration. Electronically Signed   By:  Marijo Conception, M.D.   On: 01/02/2018 14:40   US Renal  Result Date: 01/02/2018 CLINICAL DATA:  Acute on chronic renal failure. Difficulty urinating. History of previous hysterectomy and cholecystectomy. EXAM: RENAL / URINARY TRACT ULTRASOUND COMPLETE COMPARISON:  Renal ultrasound of July 06, 2016 FINDINGS: Right Kidney: Length: 10.6 cm. The renal cortical echotexture is approximately equal to that of the adjacent liver. There is mild cortical thinning. There is no hydronephrosis. Left Kidney: Length: 10.3 cm. The cortical echogenicity is similar to that of the right kidney. The amount of cortical thinning is somewhat less on the left. There is no hydronephrosis. Bladder: Appears normal for degree of bladder distention. IMPRESSION: Mildly increased renal cortical echotexture bilaterally likely reflects the history of medical renal disease. No hydronephrosis. No acute bladder abnormality. Electronically Signed   By: David  Martinique M.D.   On: 01/02/2018 16:56     Medications:   . sodium chloride 75 mL/hr at 01/03/18 2323  . meropenem (MERREM) IV Stopped (01/04/18 8119)  . vancomycin 1,000 mg (01/04/18 1047)   . budesonide (PULMICORT) nebulizer solution  0.5 mg Nebulization BID  . calcitRIOL  0.5 mcg Oral Daily  . cholecalciferol  2,000 Units Oral Daily  . clonazePAM  0.5 mg Oral TID   And  . clonazePAM  1.5 mg Oral QHS  . ezetimibe  10 mg Oral Daily  . famotidine  20 mg Oral QHS  . heparin  5,000 Units Subcutaneous Q8H  . hydrALAZINE  50 mg Oral TID  . ipratropium-albuterol  3 mL Nebulization Q6H  . isosorbide mononitrate  30 mg Oral Daily  . levothyroxine  125 mcg Oral Daily  . omega-3 acid ethyl esters  2 g Oral BID  . vitamin B-12  1,000 mcg Oral BID  . vitamin C  1,000 mg Oral Daily   acetaminophen, morphine injection, ondansetron **OR** ondansetron (ZOFRAN) IV, traMADol  Assessment/ Plan:  77 y.o. female with medical problems of coronary artery disease, MI in 2004, with  subsequent cardiac catheterizations, peripheral vascular disease, chronic kidney disease, COPD on home oxygen, GERD, hypertension, atrial fibrillation, was admitted on 01/02/2018 with worsening left leg wound that started after a fall  1.  Acute renal failure chronic kidney disease stage 4 Baseline creatinine 1.87/GFR 25 from December 20, 2017 Admitting creatinine 2.94 Today serum creatinine has improved slightly to 1.72. Continue to monitor closely  2.  Lower extremity edema Quite severe Consider restarting her home dose of diuretics tomorrow Avoid hydralazine if possible because it may contribute to worsening of edema     LOS: 2 [name] 7/3/201911:07 AM  [place], [place]  Note: This note was prepared with Dragon dictation. Any transcription errors are unintentional

## 2018-01-04 NOTE — Progress Notes (Signed)
Pt seen by Cardiology pre-op.  Prefers to diurese further prior to surgery. Reschedule for Friday.

## 2018-01-04 NOTE — Progress Notes (Signed)
Patient has no acute event overnight. VSS, on 2L supplemental oxygen at baseline. Patient kept NPO overnight for possible left calf I&D in AM.

## 2018-01-04 NOTE — Progress Notes (Signed)
Pharmacy Antibiotic Note  [name] is a 77 y.o. female admitted on 01/02/2018 with Wound- purulent , necrotic on L leg.Marland Kitchen  Pharmacy has been consulted for Vancomycin and Meropenem dosing.  Patient at [facility] 6/16-6/18/19 for AKI, fall  NOTE ALLERGIES  Allergies  Allergen Reactions  . Ciprofloxacin Hcl Anaphylaxis  . Penicillins Anaphylaxis    Near death experience,  Has patient had a PCN reaction causing immediate rash, facial/tongue/throat swelling, SOB or lightheadedness with hypotension: Yes Has patient had a PCN reaction causing severe rash involving mucus membranes or skin necrosis: No Has patient had a PCN reaction that required hospitalization: Yes Has patient had a PCN reaction occurring within the last 10 years: No If all of the above answers are "NO", then may proceed with Cephalosporin use.  . Amlodipine     Leg swelling  . Ceclor [Cefaclor] Nausea And Vomiting  . Codeine Nausea Only and Other (See Comments)    Cramps, nausea  . Lipitor [Atorvastatin] Other (See Comments)    Myalgias   . Sulfa Antibiotics Rash         Plan: 01/04/18 10:00 serum creatinine has further improved to 1.72 mg/dL. Increase vancomycin one more time to 1 gm IV Q24H, predicted trough 16 mcg/mL. Will check vancomycin trough 01/06/18 at 09:30. Meropenem as ordered.   01/03/18 07:25 serum creatinine has improved from 2.94 to 2.26 mg/dL, which is about baseline according to H&P. Will check vancomycin random now and if it's less than 20 mcg/ml we will start vancomycin 1.25 gm IV Q36H for wound infection/cellulitis. Vd 53.3 L, Ke 0.025 hr-1, T1/2 27.5 hr, predicted trough 15 mcg/ml.   Will increase meropenem to 1000 mg IV Q12h for Crcl >25 ml/min. Pharmacy note for nursing to monitor patient d/t hx of allergy to PCN.   Height: 5\' 7"  (170.2 cm) Weight: 221 lb 1.6 oz (100.3 kg) IBW/kg (Calculated) : 61.6  Temp (24hrs), Avg:98.3 F (36.8 C), Min:98.1 F (36.7 C), Max:99 F (37.2 C)  Recent Labs   Lab 01/02/18 1338 01/03/18 0555 01/04/18 0417  WBC 4.9 4.1 5.7  CREATININE 2.94* 2.26* 1.72*  VANCORANDOM  --  8  --     Estimated Creatinine Clearance: 33.3 mL/min (A) (by C-G formula based on SCr of 1.72 mg/dL (H)).    Allergies  Allergen Reactions  . Ciprofloxacin Hcl Anaphylaxis  . Penicillins Anaphylaxis    Near death experience,  Has patient had a PCN reaction causing immediate rash, facial/tongue/throat swelling, SOB or lightheadedness with hypotension: Yes Has patient had a PCN reaction causing severe rash involving mucus membranes or skin necrosis: No Has patient had a PCN reaction that required hospitalization: Yes Has patient had a PCN reaction occurring within the last 10 years: No If all of the above answers are "NO", then may proceed with Cephalosporin use.  . Amlodipine     Leg swelling  . Ceclor [Cefaclor] Nausea And Vomiting  . Codeine Nausea Only and Other (See Comments)    Cramps, nausea  . Lipitor [Atorvastatin] Other (See Comments)    Myalgias   . Sulfa Antibiotics Rash         Antimicrobials this admission: Vanc  7/1 >>   Meropenem  7/1 >>    Dose adjustments this admission:    Microbiology results:   BCx:     UCx:      Sputum:      MRSA PCR:   7/1 Wound cx: pending  Thank you for allowing pharmacy to be a  part of this patient's care.  Laural Benes, Pharm.D., BCPS Clinical Pharmacist 01/04/2018 10:00 AM

## 2018-01-04 NOTE — Progress Notes (Signed)
Patient was returned to the room from OR and no procedure was done, DR sainani was notified and patient diet was resumed

## 2018-01-04 NOTE — Progress Notes (Signed)
Patient off the floor to OR for left wound debridement ,

## 2018-01-04 NOTE — Progress Notes (Signed)
    Received page from RN asking to risk stratify the patient for debridement of her leg wound. Patient is at least moderate risk for non-cardiac surgery given her multiple comorbid conditions, both medical and cardiac. No further cardiac testing will change this risk at this time given her acute illness and comorbid conditions. I will defer medical risk stratification to IM. Defer timing of surgical procedure given her comorbid conditions to performing team. Discussed with MD.

## 2018-01-04 NOTE — Progress Notes (Signed)
Progress Note  Patient Name: [name] Date of Encounter: 01/04/2018  Primary Cardiologist: Rockey Situ  Subjective   Feels close to her baseline today following pRBC, HGB 7.3-->8.4. Remains on supplemental oxygen. Wheezing persists. On IV fluids at 75 mL/hr. For debridement of her leg wound today. No chest pain.   Inpatient Medications    Scheduled Meds: . [MAR Hold] budesonide (PULMICORT) nebulizer solution  0.5 mg Nebulization BID  . [MAR Hold] calcitRIOL  0.5 mcg Oral Daily  . [MAR Hold] cholecalciferol  2,000 Units Oral Daily  . [MAR Hold] clonazePAM  0.5 mg Oral TID   And  . [MAR Hold] clonazePAM  1.5 mg Oral QHS  . [MAR Hold] ezetimibe  10 mg Oral Daily  . [MAR Hold] famotidine  20 mg Oral QHS  . [MAR Hold] heparin  5,000 Units Subcutaneous Q8H  . [MAR Hold] hydrALAZINE  50 mg Oral TID  . [MAR Hold] ipratropium-albuterol  3 mL Nebulization Q6H  . [MAR Hold] isosorbide mononitrate  30 mg Oral Daily  . [MAR Hold] levothyroxine  125 mcg Oral Daily  . [MAR Hold] omega-3 acid ethyl esters  2 g Oral BID  . scopolamine  1 patch Transdermal Once  . scopolamine      . [MAR Hold] vitamin C  1,000 mg Oral Daily   Continuous Infusions: . sodium chloride 75 mL/hr at 01/03/18 2323  . sodium chloride    . [MAR Hold] meropenem (MERREM) IV Stopped (01/04/18 3614)  . [facility] Hold] vancomycin 1,000 mg (01/04/18 1047)   PRN Meds: [MAR Hold] acetaminophen, [MAR Hold]  morphine injection, [MAR Hold] ondansetron **OR** [MAR Hold] ondansetron (ZOFRAN) IV, [MAR Hold] traMADol   Vital Signs    Vitals:   01/03/18 2139 01/04/18 0214 01/04/18 0427 01/04/18 0736  BP: (!) 142/43  (!) 134/45 (!) 153/52  Pulse: (!) 48  (!) 57 (!) 56  Resp: 18  17 18   Temp: 98.3 F (36.8 C)  99 F (37.2 C) 98.2 F (36.8 C)  TempSrc: Oral  Oral Oral  SpO2: 98% 97% 92% 96%  Weight:   221 lb 1.6 oz (100.3 kg)   Height:        Intake/Output Summary (Last 24 hours) at 01/04/2018 1314 Last data filed at  01/04/2018 4315 Gross per 24 hour  Intake 3153 ml  Output 1500 ml  Net 1653 ml   Filed Weights   01/02/18 1740 01/03/18 0359 01/04/18 0427  Weight: 216 lb 4.3 oz (98.1 kg) 217 lb 12.8 oz (98.8 kg) 221 lb 1.6 oz (100.3 kg)    Telemetry    NSR - Personally Reviewed  ECG    n/a - Personally Reviewed  Physical Exam   GEN: Chronically ill appearing; No acute distress.   Neck: JVD. Cardiac: RRR, II/VI systolic murmur, no rubs, or gallops.  Respiratory: Diminished breath sounds bilaterally with diffuse wheezing.  GI: Soft, nontender, non-distended.   MS: 1-2+ bilateral pitting edema to the bilateral thighs; No deformity. Neuro:  Alert and oriented x 3; Nonfocal.  Psych: Normal affect.  Labs    Chemistry Recent Labs  Lab 01/02/18 1338 01/03/18 0555 01/04/18 0417  NA 131* 133* 134*  K 4.8 4.5 4.6  CL 97* 99 102  CO2 26 26 26   GLUCOSE 111* 91 90  BUN 54* 48* 40*  CREATININE 2.94* 2.26* 1.72*  CALCIUM 8.7* 8.5* 8.7*  PROT 6.4*  --   --   ALBUMIN 3.3*  --   --  AST 33  --   --   ALT 22  --   --   ALKPHOS 82  --   --   BILITOT 0.8  --   --   GFRNONAA 14* 20* 27*  GFRAA 17* 23* 32*  ANIONGAP 8 8 6      Hematology Recent Labs  Lab 01/02/18 1338 01/03/18 0555 01/04/18 0417  WBC 4.9 4.1 5.7  RBC 2.42*  2.37* 2.13* 2.44*  HGB 8.5* 7.3* 8.4*  HCT 25.6* 22.3* 25.0*  MCV 106.0* 104.8* 102.5*  MCH 35.1* 34.5* 34.6*  MCHC 33.2 32.9 33.8  RDW 16.1* 15.7* 17.9*  PLT 305 265 264    Cardiac Enzymes Recent Labs  Lab 01/02/18 1338  TROPONINI 0.03*   No results for input(s): TROPIPOC in the last 168 hours.   BNPNo results for input(s): BNP, PROBNP in the last 168 hours.   DDimer No results for input(s): DDIMER in the last 168 hours.   Radiology    Dg Chest 2 View  Result Date: 01/02/2018 CLINICAL DATA:  Shortness of breath. EXAM: CHEST - 2 VIEW COMPARISON:  Radiograph December 19, 2017. FINDINGS: Stable cardiomegaly. Atherosclerosis of thoracic aorta is noted. No  pneumothorax or pleural effusion is noted. No acute pulmonary disease is noted. Bony thorax is unremarkable. IMPRESSION: Stable cardiomegaly.  No acute cardiopulmonary abnormality seen. Aortic Atherosclerosis (ICD10-I70.0). Electronically Signed   By: Marijo Conception, M.D.   On: 01/02/2018 14:38   Dg Tibia/fibula Left  Result Date: 01/02/2018 CLINICAL DATA:  Left leg laceration. EXAM: LEFT TIBIA AND FIBULA - 2 VIEW COMPARISON:  None. FINDINGS: There is no evidence of fracture or other focal bone lesions. Large soft tissue defect is seen medially and posteriorly in the distal left calf consistent with laceration. No radiopaque foreign body is noted. IMPRESSION: No fracture or dislocation is noted. Large soft tissue defect seen medially and posteriorly in distal left calf consistent with laceration. Electronically Signed   By: Marijo Conception, M.D.   On: 01/02/2018 14:40   US Renal  Result Date: 01/02/2018 CLINICAL DATA:  Acute on chronic renal failure. Difficulty urinating. History of previous hysterectomy and cholecystectomy. EXAM: RENAL / URINARY TRACT ULTRASOUND COMPLETE COMPARISON:  Renal ultrasound of July 06, 2016 FINDINGS: Right Kidney: Length: 10.6 cm. The renal cortical echotexture is approximately equal to that of the adjacent liver. There is mild cortical thinning. There is no hydronephrosis. Left Kidney: Length: 10.3 cm. The cortical echogenicity is similar to that of the right kidney. The amount of cortical thinning is somewhat less on the left. There is no hydronephrosis. Bladder: Appears normal for degree of bladder distention. IMPRESSION: Mildly increased renal cortical echotexture bilaterally likely reflects the history of medical renal disease. No hydronephrosis. No acute bladder abnormality. Electronically Signed   By: David  Martinique M.D.   On: 01/02/2018 16:56    Cardiac Studies   Echo 12/20/2017: Study Conclusions  - Left ventricle: The cavity size was mildly dilated.  Wall thickness was increased in a pattern of mild LVH. Systolic function was vigorous. The estimated ejection fraction was in the range of 65% to 70%. Features are consistent with a pseudonormal left ventricular filling pattern, with concomitant abnormal relaxation and increased filling pressure (grade 2 diastolic dysfunction). Doppler parameters are consistent with high ventricular filling pressure. - Left atrium: The atrium was mildly dilated. - Right ventricle: The cavity size was mildly dilated. Systolic function was normal.   Patient Profile     77 y.o. female with  history of CAD s/p LAD stenting in 2004, HTN, HL, diast dysfxn, PAF previously on Pradaxa, CKD III, persistent anemia, recent mechanical fall, hypothyroidism,obesity, claudication/PAD,and GERD who is being seen today for the evaluation of lower extremity swelling.  Assessment & Plan    1. Pre-operative cardiac evaluation: -She remains at least a moderate risk for non-cardiac surgery given her multiple comorbid conditions including acute on chronic diastolic CHF, AECOPD, anemia, and chronic respiratory failure -She could benefit from further diuresis to assist in optimization from a cardiac perspective -Case has been deferred until 7/5 at this time for further optimization of her comorbid conditions -Medical clearance is deferred to IM -Case has been discussed between cardiology and anesthesia   2. Acute on chronic diastolic CHF: -She continues to appear volume up -Cannot rule out possibility of TR playing a role in her JVD elevation -Stop IV fluids -Start IV Lasix 20 mg bid  3. Chronic respiratory failure with hypoxia secondary to AECOPD: -Lung sounds remains tight -Recommend further evaluation per IM  4. Elevated troponin: -No angina -Recent TTE as above -Likely supply demand ischemia in the setting of volume overload and AKI -No plans for inpatient ischemic evaluation at this time given her  lack of anginal symptoms and comorbid conditions precluding (anemia)  5. Anemia: -Improved following pRBC  6. Necrotic leg wound: -Per Podiatry    For questions or updates, please contact [place] Please consult [email or web address] for contact info under Cardiology/STEMI.    Signed, Christell Faith, PA-C [place] Pager: [phone] 01/04/2018, 1:14 PM

## 2018-01-04 NOTE — Progress Notes (Signed)
[facility] NAME: [name]    MR#:  [id number]  DATE OF BIRTH:  September 26, 1940  SUBJECTIVE:   Patient presented to the hospital due to worsening shortness of breath and weeping left lower extremity wound consistent with cellulitis.  Patient was transfused yesterday and hemoglobin improved and feels a little bit better.  Patient still has significant lower extremity edema and therefore restarted back on IV diuretics.  Was supposed to get debridement of her left lower extremity wound but canceled as pt. Is getting diuresed and rescheduled for Friday.   REVIEW OF SYSTEMS:    Review of Systems  Constitutional: Negative for chills and fever.  HENT: Negative for congestion and tinnitus.   Eyes: Negative for blurred vision and double vision.  Respiratory: Positive for shortness of breath. Negative for cough and wheezing.   Cardiovascular: Positive for leg swelling. Negative for chest pain, orthopnea and PND.  Gastrointestinal: Negative for abdominal pain, diarrhea, nausea and vomiting.  Genitourinary: Negative for dysuria and hematuria.  Neurological: Positive for weakness (Generalized). Negative for dizziness, sensory change and focal weakness.  All other systems reviewed and are negative.   Nutrition: Heart Healthy Tolerating Diet: yes Tolerating PT: Await Eval   DRUG ALLERGIES:   Allergies  Allergen Reactions  . Ciprofloxacin Hcl Anaphylaxis  . Penicillins Anaphylaxis    Near death experience,  Has patient had a PCN reaction causing immediate rash, facial/tongue/throat swelling, SOB or lightheadedness with hypotension: Yes Has patient had a PCN reaction causing severe rash involving mucus membranes or skin necrosis: No Has patient had a PCN reaction that required hospitalization: Yes Has patient had a PCN reaction occurring within the last 10 years: No If all of the above answers are "NO", then may proceed with Cephalosporin  use.  . Amlodipine     Leg swelling  . Ceclor [Cefaclor] Nausea And Vomiting  . Codeine Nausea Only and Other (See Comments)    Cramps, nausea  . Lipitor [Atorvastatin] Other (See Comments)    Myalgias   . Sulfa Antibiotics Rash         VITALS:  Blood pressure (!) 153/52, pulse (!) 56, temperature 98.2 F (36.8 C), temperature source Oral, resp. rate 18, height 5\' 7"  (1.702 m), weight 100.3 kg (221 lb 1.6 oz), SpO2 96 %.  PHYSICAL EXAMINATION:   Physical Exam  GENERAL:  77 y.o.-year-old patient lying in bed in no acute distress.  EYES: Pupils equal, round, reactive to light and accommodation. No scleral icterus. Extraocular muscles intact.  HEENT: Head atraumatic, normocephalic. Oropharynx and nasopharynx clear.  NECK:  Supple, no jugular venous distention. No thyroid enlargement, no tenderness.  LUNGS: Normal breath sounds bilaterally, no wheezing, rales, rhonchi. No use of accessory muscles of respiration.  CARDIOVASCULAR: S1, S2 normal. No murmurs, rubs, or gallops.  ABDOMEN: Soft, nontender, nondistended. Bowel sounds present. No organomegaly or mass.  EXTREMITIES: No cyanosis, clubbing, +1-2 edema b/l.  Left lower ext. Wrapped in kerlex   NEUROLOGIC: Cranial nerves II through XII are intact. No focal Motor or sensory deficits b/l.  Globally weak.  PSYCHIATRIC: The patient is alert and oriented x 3.  SKIN: No obvious rash, lesion, LLE ulcer wrapped in kerlex.    LABORATORY PANEL:   CBC Recent Labs  Lab 01/04/18 0417  WBC 5.7  HGB 8.4*  HCT 25.0*  PLT 264   ------------------------------------------------------------------------------------------------------------------  Chemistries  Recent Labs  Lab 01/02/18 1338  01/04/18 0417  NA 131*   < >  134*  K 4.8   < > 4.6  CL 97*   < > 102  CO2 26   < > 26  GLUCOSE 111*   < > 90  BUN 54*   < > 40*  CREATININE 2.94*   < > 1.72*  CALCIUM 8.7*   < > 8.7*  AST 33  --   --   ALT 22  --   --   ALKPHOS 82  --   --    BILITOT 0.8  --   --    < > = values in this interval not displayed.   ------------------------------------------------------------------------------------------------------------------  Cardiac Enzymes Recent Labs  Lab 01/02/18 1338  TROPONINI 0.03*   ------------------------------------------------------------------------------------------------------------------  RADIOLOGY:  Dg Chest 2 View  Result Date: 01/02/2018 CLINICAL DATA:  Shortness of breath. EXAM: CHEST - 2 VIEW COMPARISON:  Radiograph December 19, 2017. FINDINGS: Stable cardiomegaly. Atherosclerosis of thoracic aorta is noted. No pneumothorax or pleural effusion is noted. No acute pulmonary disease is noted. Bony thorax is unremarkable. IMPRESSION: Stable cardiomegaly.  No acute cardiopulmonary abnormality seen. Aortic Atherosclerosis (ICD10-I70.0). Electronically Signed   By: Marijo Conception, M.D.   On: 01/02/2018 14:38   Dg Tibia/fibula Left  Result Date: 01/02/2018 CLINICAL DATA:  Left leg laceration. EXAM: LEFT TIBIA AND FIBULA - 2 VIEW COMPARISON:  None. FINDINGS: There is no evidence of fracture or other focal bone lesions. Large soft tissue defect is seen medially and posteriorly in the distal left calf consistent with laceration. No radiopaque foreign body is noted. IMPRESSION: No fracture or dislocation is noted. Large soft tissue defect seen medially and posteriorly in distal left calf consistent with laceration. Electronically Signed   By: Marijo Conception, M.D.   On: 01/02/2018 14:40   US Renal  Result Date: 01/02/2018 CLINICAL DATA:  Acute on chronic renal failure. Difficulty urinating. History of previous hysterectomy and cholecystectomy. EXAM: RENAL / URINARY TRACT ULTRASOUND COMPLETE COMPARISON:  Renal ultrasound of July 06, 2016 FINDINGS: Right Kidney: Length: 10.6 cm. The renal cortical echotexture is approximately equal to that of the adjacent liver. There is mild cortical thinning. There is no hydronephrosis.  Left Kidney: Length: 10.3 cm. The cortical echogenicity is similar to that of the right kidney. The amount of cortical thinning is somewhat less on the left. There is no hydronephrosis. Bladder: Appears normal for degree of bladder distention. IMPRESSION: Mildly increased renal cortical echotexture bilaterally likely reflects the history of medical renal disease. No hydronephrosis. No acute bladder abnormality. Electronically Signed   By: David  Martinique M.D.   On: 01/02/2018 16:56     ASSESSMENT AND PLAN:   [name]  is a 77 y.o. female with a known history of CAD status post PCI in 2004, peripheral vascular disease, CKD stage III, COPD on 2 L home oxygen, GERD, hypertension, A. fib not on anticoagulation due to anemia, anemia of chronic disease, recent fall presents to hospital secondary to worsening weakness and left leg wound with discharge.  1. Necrotic purulent left leg wound with cellulitis-triggered after a trauma and pressure injury. -Status post superficial debridement and wound culture sent by ER -Continue IV vancomycin, Meropenem for now. - seen by podiatry and plan was for debridement today but since pt. Is getting IV diuretics for CHF, debridement postponed until Friday.   2.  Acute renal failure on CKD stage III-Baseline creatinine seems to be around 2.2, now gradually worsening - Cr. Improved w/ blood transfusion and fluids.  Cr. 1.7 today.  -  Seen by nephrology and cont. Current care. Renal ultrasound showing no hydronephrosis but medical renal disease. - Renal dose meds, avoid nephrotoxins. Follow BUN/Cr.   3. Acute on Chronic diastolic heart failure-she has chronic lower extremity edema and appears more volume overloaded today as per Cards.  - started back on IV lasix today as per Cards. Cont. Imdur.     4.  Atrial fibrillation-chronic A. fib, bradycardic.  Not on any beta-blockers. - appreciate Cards consult and hold Amio for now.    - Pradaxa has been discontinued last  admission due to anemia  5.  Anemia of chronic disease- cont. To hold Pradaxa.   - s/p 1 unit of PRBC's yesterday and Hg. Improved post transfusion and pt. Has clinically improved.   6.  Hypertension- cont. hydralazine and Imdur  7.  Hypothyroidism- continue Synthroid.  All the records are reviewed and case discussed with Care Management/Social Worker. Management plans discussed with the patient, family and they are in agreement.  CODE STATUS: Full code  DVT Prophylaxis: Hep SQ  TOTAL TIME TAKING CARE OF THIS PATIENT: 30 minutes.   POSSIBLE D/C IN 2-3 DAYS, DEPENDING ON CLINICAL CONDITION   Henreitta Leber M.D on 01/04/2018 at 1:54 PM  Between 7am to 6pm - Pager - [phone]  After 6pm go to [email or web address] - Proofreader  [facility]  Office  [phone]  CC: Primary care physician; Lemmie Evens, MD

## 2018-01-05 DIAGNOSIS — N179 Acute kidney failure, unspecified: Secondary | ICD-10-CM

## 2018-01-05 DIAGNOSIS — J441 Chronic obstructive pulmonary disease with (acute) exacerbation: Secondary | ICD-10-CM

## 2018-01-05 DIAGNOSIS — N189 Chronic kidney disease, unspecified: Secondary | ICD-10-CM

## 2018-01-05 DIAGNOSIS — J811 Chronic pulmonary edema: Secondary | ICD-10-CM

## 2018-01-05 DIAGNOSIS — L089 Local infection of the skin and subcutaneous tissue, unspecified: Secondary | ICD-10-CM

## 2018-01-05 LAB — BASIC METABOLIC PANEL
ANION GAP: 5 (ref 5–15)
BUN: 29 mg/dL — ABNORMAL HIGH (ref 8–23)
CHLORIDE: 103 mmol/L (ref 98–111)
CO2: 29 mmol/L (ref 22–32)
Calcium: 9.2 mg/dL (ref 8.9–10.3)
Creatinine, Ser: 1.33 mg/dL — ABNORMAL HIGH (ref 0.44–1.00)
GFR calc non Af Amer: 37 mL/min — ABNORMAL LOW (ref >60)
GFR, EST AFRICAN AMERICAN: 43 mL/min — AB (ref >60)
Glucose, Bld: 93 mg/dL (ref 70–99)
Potassium: 4.6 mmol/L (ref 3.5–5.1)
Sodium: 137 mmol/L (ref 135–145)

## 2018-01-05 MED ORDER — DICLOFENAC SODIUM 1 % TD GEL
2.0000 g | Freq: Three times a day (TID) | TRANSDERMAL | Status: DC | PRN
  Administered 2018-01-05 – 2018-01-07 (×2): 2 g via TOPICAL
  Filled 2018-01-05: qty 100

## 2018-01-05 MED ORDER — SODIUM CHLORIDE 0.9% FLUSH
3.0000 mL | Freq: Two times a day (BID) | INTRAVENOUS | Status: DC
  Administered 2018-01-05 – 2018-01-09 (×8): 3 mL via INTRAVENOUS

## 2018-01-05 MED ORDER — METHYLPREDNISOLONE SODIUM SUCC 40 MG IJ SOLR
40.0000 mg | Freq: Two times a day (BID) | INTRAMUSCULAR | Status: DC
  Administered 2018-01-05 – 2018-01-09 (×10): 40 mg via INTRAVENOUS
  Filled 2018-01-05 (×10): qty 1

## 2018-01-05 NOTE — Progress Notes (Signed)
Progress Note  Patient Name: [name] Date of Encounter: 01/05/2018  Primary Cardiologist: Rockey Situ  Subjective   Feels close to her baseline today  HGB 7.3-->8.4 yesterday Chronic COPD, wheezing  IV fluids held yesterday started on low-dose IV Lasix Started on steroids For debridement of  leg wound tomorrow Resting comfortably in supine position  Inpatient Medications    Scheduled Meds: . budesonide (PULMICORT) nebulizer solution  0.5 mg Nebulization BID  . calcitRIOL  0.5 mcg Oral Daily  . cholecalciferol  2,000 Units Oral Daily  . clonazePAM  0.5 mg Oral TID   And  . clonazePAM  1.5 mg Oral QHS  . ezetimibe  10 mg Oral Daily  . famotidine  20 mg Oral QHS  . furosemide  20 mg Intravenous BID  . heparin  5,000 Units Subcutaneous Q8H  . hydrALAZINE  50 mg Oral TID  . ipratropium-albuterol  3 mL Nebulization Q6H  . isosorbide mononitrate  30 mg Oral Daily  . levothyroxine  125 mcg Oral Daily  . methylPREDNISolone (SOLU-MEDROL) injection  40 mg Intravenous Q12H  . omega-3 acid ethyl esters  2 g Oral BID  . scopolamine  1 patch Transdermal Once  . sodium chloride flush  3 mL Intravenous Q12H  . vitamin C  1,000 mg Oral Daily   Continuous Infusions: . meropenem (MERREM) IV Stopped (01/05/18 0626)  . vancomycin Stopped (01/05/18 1020)   PRN Meds: acetaminophen, diclofenac sodium, morphine injection, ondansetron **OR** ondansetron (ZOFRAN) IV, traMADol   Vital Signs    Vitals:   01/05/18 0353 01/05/18 0724 01/05/18 0757 01/05/18 0859  BP: (!) 153/56  (!) 115/92   Pulse: 65  63 63  Resp:   18   Temp: 98.2 F (36.8 C)  99.1 F (37.3 C)   TempSrc: Oral  Oral   SpO2: 94% 94% 95%   Weight:      Height:        Intake/Output Summary (Last 24 hours) at 01/05/2018 1337 Last data filed at 01/05/2018 1218 Gross per 24 hour  Intake 440 ml  Output 1250 ml  Net -810 ml   Filed Weights   01/02/18 1740 01/03/18 0359 01/04/18 0427  Weight: 216 lb 4.3 oz (98.1 kg)  217 lb 12.8 oz (98.8 kg) 221 lb 1.6 oz (100.3 kg)    Telemetry    NSR - Personally Reviewed  ECG    n/a - Personally Reviewed  Physical Exam   GEN: Chronically ill appearing; No acute distress.   Neck: JVD. Cardiac: RRR, II/VI systolic murmur, no rubs, or gallops.  Respiratory:moderately decreased breath sounds throughout with expiratory wheezing throughout GI: Soft, nontender, non-distended.   MS: trace lower extremity edema, No deformity. Neuro:  Alert and oriented x 3; Nonfocal.  Psych: Normal affect.  Labs    Chemistry Recent Labs  Lab 01/02/18 1338 01/03/18 0555 01/04/18 0417 01/05/18 0339  NA 131* 133* 134* 137  K 4.8 4.5 4.6 4.6  CL 97* 99 102 103  CO2 26 26 26 29   GLUCOSE 111* 91 90 93  BUN 54* 48* 40* 29*  CREATININE 2.94* 2.26* 1.72* 1.33*  CALCIUM 8.7* 8.5* 8.7* 9.2  PROT 6.4*  --   --   --   ALBUMIN 3.3*  --   --   --   AST 33  --   --   --   ALT 22  --   --   --   ALKPHOS 82  --   --   --  BILITOT 0.8  --   --   --   GFRNONAA 14* 20* 27* 37*  GFRAA 17* 23* 32* 43*  ANIONGAP 8 8 6 5      Hematology Recent Labs  Lab 01/02/18 1338 01/03/18 0555 01/04/18 0417  WBC 4.9 4.1 5.7  RBC 2.42*  2.37* 2.13* 2.44*  HGB 8.5* 7.3* 8.4*  HCT 25.6* 22.3* 25.0*  MCV 106.0* 104.8* 102.5*  MCH 35.1* 34.5* 34.6*  MCHC 33.2 32.9 33.8  RDW 16.1* 15.7* 17.9*  PLT 305 265 264    Cardiac Enzymes Recent Labs  Lab 01/02/18 1338  TROPONINI 0.03*   No results for input(s): TROPIPOC in the last 168 hours.   BNPNo results for input(s): BNP, PROBNP in the last 168 hours.   DDimer No results for input(s): DDIMER in the last 168 hours.   Radiology    No results found.  Cardiac Studies   Echo 12/20/2017: Study Conclusions  - Left ventricle: The cavity size was mildly dilated. Wall thickness was increased in a pattern of mild LVH. Systolic function was vigorous. The estimated ejection fraction was in the range of 65% to 70%. Features are  consistent with a pseudonormal left ventricular filling pattern, with concomitant abnormal relaxation and increased filling pressure (grade 2 diastolic dysfunction). Doppler parameters are consistent with high ventricular filling pressure. - Left atrium: The atrium was mildly dilated. - Right ventricle: The cavity size was mildly dilated. Systolic function was normal.   Patient Profile     77 y.o. female with history of CAD s/p LAD stenting in 2004, HTN, HL, diast dysfxn, PAF previously on Pradaxa, CKD III, persistent anemia, recent mechanical fall, hypothyroidism,obesity, claudication/PAD,and GERD who is being seen today for the evaluation of lower extremity swelling.  Assessment & Plan    1. Pre-operative cardiac evaluation:  moderate risk for non-cardiac surgery given her multiple comorbid conditions including acute on chronic diastolic CHF, AECOPD, anemia, and chronic respiratory failure -major limitation is severe COPD Appears relatively euvolemic We'll continue Lasix today in preparation for surgery tomorrow  2.chronic diastolic CHF: Continue  IV Lasix 20 mg bid today in preparation for surgery tomorrow Difficult to determine fluid status given her severe underlying lung disease, and improved renal function with hydration She reported mild abdominal fullness yesterday which seems to have resolved  3. Chronic respiratory failure with hypoxia secondary to AECOPD: Chronic expiratory wheezing, currently about her baseline As outpatient she would likely do well on chronic steroids  4. Elevated troponin: -No plans for inpatient ischemic evaluation at this time given her lack of anginal symptoms and comorbid conditions precluding (anemia)  5. Anemia: Received one unit packed red cells Hemoglobin 8  6. Necrotic leg wound: Plan for surgery tomorrow   details of above discussed with her and several family members the bedside  Total encounter time more than 35  minutes  Greater than 50% was spent in counseling and coordination of care with the patient   For questions or updates, please contact [facility] Please consult [email or web address] for contact info under Cardiology/STEMI.    Signed, Christell Faith, PA-C [place] Pager: [phone] 01/05/2018, 1:37 PM

## 2018-01-05 NOTE — Care Management Important Message (Signed)
Important Message  Patient Details  Name: [name] MRN: [id number] Date of Birth: [birth year]   Medicare Important Message Given:  Yes    Juliann Pulse A Masami Plata 01/05/2018, 9:40 AM

## 2018-01-05 NOTE — Progress Notes (Signed)
Patient ID: [name], female   DOB: Oct 29, 1940, 77 y.o.   MRN: [id number]  [facility] PROGRESS NOTE  [name]:[id number] DOB: Aug 28, 1940 DOA: 01/02/2018 PCP: Lemmie Evens, MD  HPI/Subjective: Patient feels okay.  Some shortness of breath.  Some cough.  Objective: Vitals:   01/05/18 0757 01/05/18 0859  BP: (!) 115/92   Pulse: 63 63  Resp: 18   Temp: 99.1 F (37.3 C)   SpO2: 95%     Filed Weights   01/02/18 1740 01/03/18 0359 01/04/18 0427  Weight: 98.1 kg (216 lb 4.3 oz) 98.8 kg (217 lb 12.8 oz) 100.3 kg (221 lb 1.6 oz)    ROS: Review of Systems  Constitutional: Negative for chills and fever.  Eyes: Negative for blurred vision.  Respiratory: Positive for cough and shortness of breath.   Cardiovascular: Negative for chest pain.  Gastrointestinal: Negative for abdominal pain, constipation, diarrhea, nausea and vomiting.  Genitourinary: Negative for dysuria.  Musculoskeletal: Positive for joint pain.  Neurological: Negative for dizziness and headaches.   Exam: Physical Exam  Constitutional: She is oriented to person, place, and time.  HENT:  Nose: No mucosal edema.  Mouth/Throat: No oropharyngeal exudate or posterior oropharyngeal edema.  Eyes: Pupils are equal, round, and reactive to light. Conjunctivae, EOM and lids are normal.  Neck: No JVD present. Carotid bruit is not present. No edema present. No thyroid mass and no thyromegaly present.  Cardiovascular: S1 normal and S2 normal. Exam reveals no gallop.  No murmur heard. Respiratory: No respiratory distress. She has decreased breath sounds in the right middle field, the right lower field, the left middle field and the left lower field. She has wheezes in the right middle field, the right lower field, the left middle field and the left lower field. She has no rhonchi. She has no rales.  GI: Soft. Bowel sounds are normal. There is no tenderness.  Musculoskeletal:       Right ankle: She exhibits  swelling.       Left ankle: She exhibits swelling.  Lymphadenopathy:    She has no cervical adenopathy.  Neurological: She is alert and oriented to person, place, and time. No cranial nerve deficit.  Skin: Skin is warm. Nails show no clubbing.  Large ulcer left leg which is pretty deep  Psychiatric: She has a normal mood and affect.      Data Reviewed: Basic Metabolic Panel: Recent Labs  Lab 01/02/18 1338 01/03/18 0555 01/04/18 0417 01/05/18 0339  NA 131* 133* 134* 137  K 4.8 4.5 4.6 4.6  CL 97* 99 102 103  CO2 26 26 26 29   GLUCOSE 111* 91 90 93  BUN 54* 48* 40* 29*  CREATININE 2.94* 2.26* 1.72* 1.33*  CALCIUM 8.7* 8.5* 8.7* 9.2   Liver Function Tests: Recent Labs  Lab 01/02/18 1338  AST 33  ALT 22  ALKPHOS 82  BILITOT 0.8  PROT 6.4*  ALBUMIN 3.3*   CBC: Recent Labs  Lab 01/02/18 1338 01/03/18 0555 01/04/18 0417  WBC 4.9 4.1 5.7  NEUTROABS 3.5  --   --   HGB 8.5* 7.3* 8.4*  HCT 25.6* 22.3* 25.0*  MCV 106.0* 104.8* 102.5*  PLT 305 265 264   Cardiac Enzymes: Recent Labs  Lab 01/02/18 1338  TROPONINI 0.03*     Recent Results (from the past 240 hour(s))  Aerobic/Anaerobic Culture (surgical/deep wound)     Status: None (Preliminary result)   Collection Time: 01/02/18  1:47 PM  Result Value Ref  Range Status   Specimen Description   Final    LEG Performed at [facility], [address] [place], [place] [address]    Special Requests PENDING  Incomplete   Gram Stain   Final    FEW WBC PRESENT, PREDOMINANTLY PMN ABUNDANT GRAM NEGATIVE RODS FEW GRAM POSITIVE COCCI    Culture   Final    CULTURE REINCUBATED FOR BETTER GROWTH Performed at [facility], [place] [address] [place], [place] [address]    Report Status PENDING  Incomplete     Scheduled Meds: . budesonide (PULMICORT) nebulizer solution  0.5 mg Nebulization BID  . calcitRIOL  0.5 mcg Oral Daily  . cholecalciferol  2,000 Units Oral Daily  . clonazePAM  0.5 mg Oral TID    And  . clonazePAM  1.5 mg Oral QHS  . ezetimibe  10 mg Oral Daily  . famotidine  20 mg Oral QHS  . furosemide  20 mg Intravenous BID  . heparin  5,000 Units Subcutaneous Q8H  . hydrALAZINE  50 mg Oral TID  . ipratropium-albuterol  3 mL Nebulization Q6H  . isosorbide mononitrate  30 mg Oral Daily  . levothyroxine  125 mcg Oral Daily  . methylPREDNISolone (SOLU-MEDROL) injection  40 mg Intravenous Q12H  . omega-3 acid ethyl esters  2 g Oral BID  . scopolamine  1 patch Transdermal Once  . sodium chloride flush  3 mL Intravenous Q12H  . vitamin C  1,000 mg Oral Daily   Continuous Infusions: . meropenem (MERREM) IV Stopped (01/05/18 0626)  . vancomycin Stopped (01/05/18 1020)    Assessment/Plan:  1.  Large necrotic left leg wound with cellulitis.  After a fall.  Continue aggressive antibiotics with vancomycin and meropenem.  Patient to have debridement tomorrow in the operating room. 2.  Acute kidney injury on chronic kidney disease stage III.  Creatinine has improved to 1.33 at this point. 3.  Chronic diastolic congestive heart failure.  On IV diuresis as per cardiology. 4.  COPD exacerbation start Solu-Medrol and nebulizer treatments. 5.  Atrial fibrillation chronic.  Pradaxa on hold since last admission sent secondary to anemia. 6.  Anemia requiring transfusion on this hospital stay.  Continue to hold blood thinner. 7.  Hypertension on hydralazine and Imdur 8.  Hypothyroidism unspecified on Synthroid 9.  Morbid obesity with a BMI of 43.1.  Code Status:     Code Status Orders  (From admission, onward)        Start     Ordered   01/02/18 1735  Full code  Continuous     01/02/18 1735    Code Status History    Date Active Date Inactive Code Status Order ID Comments User Context   12/19/2017 0332 12/20/2017 1958 Full Code [id number]  Harrie Foreman, MD Inpatient    Advance Directive Documentation     Most Recent Value  Type of Advance Directive  Healthcare Power of  Attorney  Pre-existing out of facility DNR order (yellow form or pink MOST form)  -  "MOST" Form in Place?  -      Disposition Plan: To be determined based on clinical course  Consultants:  Podiatry  Vascular surgery  Nephrology  Cardiology  Antibiotics: -Vancomycin and meropenem   Time spent: 28 minutes  [place]

## 2018-01-06 ENCOUNTER — Encounter
Admission: EM | Discharge status: Against Medical Advice | Payer: Self-pay | Source: Home / Self Care | Attending: Internal Medicine

## 2018-01-06 ENCOUNTER — Inpatient Hospital Stay: Payer: Medicare Other | Admitting: Anesthesiology

## 2018-01-06 ENCOUNTER — Encounter: Payer: Self-pay | Admitting: Podiatry

## 2018-01-06 HISTORY — PX: APPLICATION OF WOUND VAC: SHX5189

## 2018-01-06 HISTORY — PX: IRRIGATION AND DEBRIDEMENT FOOT: SHX6602

## 2018-01-06 LAB — VANCOMYCIN, TROUGH
VANCOMYCIN TR: 23 ug/mL — AB (ref 15–20)
VANCOMYCIN TR: 20 ug/mL (ref 15–20)

## 2018-01-06 LAB — BASIC METABOLIC PANEL
Anion gap: 8 (ref 5–15)
BUN: 29 mg/dL — ABNORMAL HIGH (ref 8–23)
CALCIUM: 9.2 mg/dL (ref 8.9–10.3)
CO2: 28 mmol/L (ref 22–32)
CREATININE: 1.45 mg/dL — AB (ref 0.44–1.00)
Chloride: 101 mmol/L (ref 98–111)
GFR calc non Af Amer: 34 mL/min — ABNORMAL LOW (ref >60)
GFR, EST AFRICAN AMERICAN: 39 mL/min — AB (ref >60)
Glucose, Bld: 164 mg/dL — ABNORMAL HIGH (ref 70–99)
Potassium: 4.8 mmol/L (ref 3.5–5.1)
SODIUM: 137 mmol/L (ref 135–145)

## 2018-01-06 SURGERY — IRRIGATION AND DEBRIDEMENT FOOT
Anesthesia: General | Laterality: Left

## 2018-01-06 MED ORDER — FENTANYL CITRATE (PF) 100 MCG/2ML IJ SOLN
25.0000 ug | INTRAMUSCULAR | Status: DC | PRN
  Administered 2018-01-06 (×4): 25 ug via INTRAVENOUS

## 2018-01-06 MED ORDER — PHENYLEPHRINE HCL 10 MG/ML IJ SOLN
INTRAMUSCULAR | Status: DC | PRN
  Administered 2018-01-06 (×3): 100 ug via INTRAVENOUS

## 2018-01-06 MED ORDER — DOXYCYCLINE HYCLATE 100 MG PO TABS
100.0000 mg | ORAL_TABLET | Freq: Two times a day (BID) | ORAL | Status: DC
  Administered 2018-01-06 – 2018-01-09 (×7): 100 mg via ORAL
  Filled 2018-01-06 (×7): qty 1

## 2018-01-06 MED ORDER — PROPOFOL 500 MG/50ML IV EMUL
INTRAVENOUS | Status: AC
  Filled 2018-01-06: qty 50

## 2018-01-06 MED ORDER — FUROSEMIDE 40 MG PO TABS
40.0000 mg | ORAL_TABLET | Freq: Two times a day (BID) | ORAL | Status: DC
  Administered 2018-01-06 – 2018-01-07 (×2): 40 mg via ORAL
  Filled 2018-01-06 (×2): qty 1

## 2018-01-06 MED ORDER — ONDANSETRON HCL 4 MG/2ML IJ SOLN
4.0000 mg | Freq: Once | INTRAMUSCULAR | Status: AC | PRN
  Administered 2018-01-06: 4 mg via INTRAVENOUS

## 2018-01-06 MED ORDER — LIDOCAINE-EPINEPHRINE 1 %-1:100000 IJ SOLN
INTRAMUSCULAR | Status: DC | PRN
  Administered 2018-01-06: 10 mL

## 2018-01-06 MED ORDER — IPRATROPIUM-ALBUTEROL 0.5-2.5 (3) MG/3ML IN SOLN
RESPIRATORY_TRACT | Status: AC
  Administered 2018-01-06: 3 mL
  Filled 2018-01-06: qty 3

## 2018-01-06 MED ORDER — BUPIVACAINE HCL 0.5 % IJ SOLN
INTRAMUSCULAR | Status: DC | PRN
  Administered 2018-01-06: 10 mL

## 2018-01-06 MED ORDER — IPRATROPIUM-ALBUTEROL 0.5-2.5 (3) MG/3ML IN SOLN
RESPIRATORY_TRACT | Status: AC
  Administered 2018-01-06: 3 mL via RESPIRATORY_TRACT
  Filled 2018-01-06: qty 3

## 2018-01-06 MED ORDER — IPRATROPIUM-ALBUTEROL 0.5-2.5 (3) MG/3ML IN SOLN
3.0000 mL | Freq: Once | RESPIRATORY_TRACT | Status: AC
  Administered 2018-01-06: 3 mL via RESPIRATORY_TRACT

## 2018-01-06 MED ORDER — PROPOFOL 500 MG/50ML IV EMUL
INTRAVENOUS | Status: DC | PRN
  Administered 2018-01-06: 100 ug/kg/min via INTRAVENOUS

## 2018-01-06 MED ORDER — PROPOFOL 10 MG/ML IV BOLUS
INTRAVENOUS | Status: DC | PRN
  Administered 2018-01-06: 20 mg via INTRAVENOUS

## 2018-01-06 MED ORDER — LACTATED RINGERS IV SOLN
INTRAVENOUS | Status: DC | PRN
  Administered 2018-01-06: 08:00:00 via INTRAVENOUS

## 2018-01-06 MED ORDER — FENTANYL CITRATE (PF) 100 MCG/2ML IJ SOLN
INTRAMUSCULAR | Status: AC
  Administered 2018-01-06: 25 ug via INTRAVENOUS
  Filled 2018-01-06: qty 2

## 2018-01-06 SURGICAL SUPPLY — 70 items
BANDAGE ACE 4X5 VEL STRL LF (GAUZE/BANDAGES/DRESSINGS) ×1 IMPLANT
BLADE OSC/SAGITTAL MD 5.5X18 (BLADE) IMPLANT
BLADE OSCILLATING/SAGITTAL (BLADE)
BLADE SURG 15 STRL LF DISP TIS (BLADE) ×1 IMPLANT
BLADE SURG 15 STRL SS (BLADE) ×2
BLADE SW THK.38XMED LNG THN (BLADE) IMPLANT
BNDG CMPR 75X21 PLY HI ABS (MISCELLANEOUS)
BNDG COHESIVE 4X5 TAN STRL (GAUZE/BANDAGES/DRESSINGS) ×2 IMPLANT
BNDG COHESIVE 6X5 TAN STRL LF (GAUZE/BANDAGES/DRESSINGS) ×1 IMPLANT
BNDG CONFORM 3 STRL LF (GAUZE/BANDAGES/DRESSINGS) ×1 IMPLANT
BNDG ESMARK 4X12 TAN STRL LF (GAUZE/BANDAGES/DRESSINGS) ×2 IMPLANT
BNDG GAUZE 4.5X4.1 6PLY STRL (MISCELLANEOUS) ×1 IMPLANT
CANISTER SUCT 1200ML W/VALVE (MISCELLANEOUS) ×2 IMPLANT
CANISTER SUCT 3000ML PPV (MISCELLANEOUS) ×2 IMPLANT
CUFF TOURN 18 STER (MISCELLANEOUS) ×1 IMPLANT
CUFF TOURN 30 N/S (MISCELLANEOUS) ×1 IMPLANT
CUFF TOURN DUAL PL 12 NO SLV (MISCELLANEOUS) ×1 IMPLANT
DRAPE FLUOR MINI C-ARM 54X84 (DRAPES) IMPLANT
DRAPE XRAY CASSETTE 23X24 (DRAPES) IMPLANT
DRESSING ALLEVYN 4X4 (MISCELLANEOUS) IMPLANT
DURAPREP 26ML APPLICATOR (WOUND CARE) ×1 IMPLANT
ELECT REM PT RETURN 9FT ADLT (ELECTROSURGICAL) ×2
ELECTRODE REM PT RTRN 9FT ADLT (ELECTROSURGICAL) ×1 IMPLANT
GAUZE PACKING 1/4 X5 YD (GAUZE/BANDAGES/DRESSINGS) ×1 IMPLANT
GAUZE PACKING IODOFORM 1X5 (MISCELLANEOUS) ×1 IMPLANT
GAUZE PETRO XEROFOAM 1X8 (MISCELLANEOUS) ×1 IMPLANT
GAUZE SPONGE 4X4 12PLY STRL (GAUZE/BANDAGES/DRESSINGS) ×1 IMPLANT
GAUZE STRETCH 2X75IN STRL (MISCELLANEOUS) ×1 IMPLANT
GLOVE BIO SURGEON STRL SZ7.5 (GLOVE) ×2 IMPLANT
GLOVE INDICATOR 8.0 STRL GRN (GLOVE) ×2 IMPLANT
GOWN STRL REUS W/ TWL LRG LVL3 (GOWN DISPOSABLE) ×2 IMPLANT
GOWN STRL REUS W/TWL LRG LVL3 (GOWN DISPOSABLE) ×4
GOWN STRL REUS W/TWL MED LVL3 (GOWN DISPOSABLE) ×4 IMPLANT
HANDPIECE VERSAJET DEBRIDEMENT (MISCELLANEOUS) ×1 IMPLANT
IV NS 1000ML (IV SOLUTION) ×2
IV NS 1000ML BAXH (IV SOLUTION) ×1 IMPLANT
KIT DRSG VAC SLVR GRANUFM (MISCELLANEOUS) ×2 IMPLANT
KIT TURNOVER KIT A (KITS) ×2 IMPLANT
LABEL OR SOLS (LABEL) ×2 IMPLANT
NDL FILTER BLUNT 18X1 1/2 (NEEDLE) ×1 IMPLANT
NDL HYPO 25X1 1.5 SAFETY (NEEDLE) ×1 IMPLANT
NEEDLE FILTER BLUNT 18X 1/2SAF (NEEDLE)
NEEDLE FILTER BLUNT 18X1 1/2 (NEEDLE) IMPLANT
NEEDLE HYPO 25X1 1.5 SAFETY (NEEDLE) ×2 IMPLANT
NS IRRIG 500ML POUR BTL (IV SOLUTION) ×2 IMPLANT
PACK EXTREMITY ARMC (MISCELLANEOUS) ×2 IMPLANT
PAD ABD DERMACEA PRESS 5X9 (GAUZE/BANDAGES/DRESSINGS) ×1 IMPLANT
PENCIL ELECTRO HAND CTR (MISCELLANEOUS) ×2 IMPLANT
PULSAVAC PLUS IRRIG FAN TIP (DISPOSABLE)
RASP SM TEAR CROSS CUT (RASP) IMPLANT
SHIELD FULL FACE ANTIFOG 7M (MISCELLANEOUS) ×2 IMPLANT
SOL .9 NS 3000ML IRR  AL (IV SOLUTION)
SOL .9 NS 3000ML IRR AL (IV SOLUTION)
SOL .9 NS 3000ML IRR UROMATIC (IV SOLUTION) ×1 IMPLANT
SOL PREP PVP 2OZ (MISCELLANEOUS) ×2
SOLUTION PREP PVP 2OZ (MISCELLANEOUS) ×1 IMPLANT
STOCKINETTE IMPERVIOUS 9X36 MD (GAUZE/BANDAGES/DRESSINGS) ×2 IMPLANT
SUT ETHILON 2 0 FS 18 (SUTURE) ×2 IMPLANT
SUT ETHILON 4-0 (SUTURE)
SUT ETHILON 4-0 FS2 18XMFL BLK (SUTURE)
SUT VIC AB 3-0 SH 27 (SUTURE)
SUT VIC AB 3-0 SH 27X BRD (SUTURE) ×1 IMPLANT
SUT VIC AB 4-0 FS2 27 (SUTURE) ×1 IMPLANT
SUTURE ETHLN 4-0 FS2 18XMF BLK (SUTURE) ×1 IMPLANT
SWAB CULTURE AMIES ANAERIB BLU (MISCELLANEOUS) IMPLANT
SYR 10ML LL (SYRINGE) ×4 IMPLANT
SYR 3ML LL SCALE MARK (SYRINGE) ×1 IMPLANT
TIP FAN IRRIG PULSAVAC PLUS (DISPOSABLE) ×1 IMPLANT
TRAY PREP VAG/GEN (MISCELLANEOUS) ×2 IMPLANT
WND VAC CANISTER 500ML (MISCELLANEOUS) ×2 IMPLANT

## 2018-01-06 NOTE — Op Note (Signed)
Operative note   Surgeon:Jakeisha Stricker Lawyer: None    Preop diagnosis: Traumatic left leg calf ulcer with necrosis    Postop diagnosis: Same    Procedure: Excisional debridement full-thickness to fascia left calf 2.  Placement wound VAC left leg    EBL: Minimal    Anesthesia:local and IV sedation    Hemostasis: Epinephrine infiltrated along the wound    Specimen: None    Complications: None    Operative indications:Mizani C Finkle is an 77 y.o. that presents today for surgical intervention.  The risks/benefits/alternatives/complications have been discussed and consent has been given.    Procedure:  Patient was brought into the OR and placed on the operating table in thesupine position. After anesthesia was obtained theleft lower extremity was prepped and draped in usual sterile fashion.  Patient's left leg was placed in frog-leg position.  After sterile prep and drape the wound was initially evaluated.  The wound measures approximately 8 cm by 6 centimeters.  Areas of necrotic tissue were noted.  A skin flap was also noted.  All of this was evaluated and flushed initially.  Next with the use of a versa jet excisional full-thickness debridement down to fascia and muscle was performed.  Bleeding was noted and pressure was applied for several minutes.  Bleeding was minimized.  No residual necrotic tissue was noted.  This time no purulence was noted.  The wound bed appeared to be quite healthy after debridement.  At this time the skin flap was loosely reapproximated with a 3-0 nylon.  Next a large silver impregnated wound VAC dressing was applied and the wound VAC was applied at 125 mmHg with continuous pressure.    Patient tolerated the procedure and anesthesia well.  Was transported from the OR to the PACU with all vital signs stable and vascular status intact. To be discharged per routine protocol.

## 2018-01-06 NOTE — Care Management (Signed)
Patient admitted from home home with necrotic leg wound.  Patient lives at home with spouse. Marland Kitchen PCP Karie Kirks.  Patient has BSC, RW, and O2 through [facility].  Patient currently open with [facility] for RN and PT.  PT has assessed patient and recommends SNF.  Per CSW patient has declined SNF and wishes to return home with home health services.  Patient currently with wound vac in place. Corene Cornea with [facility] to complete Vac order sheet and place on chart.  MD will need to sign

## 2018-01-06 NOTE — Clinical Social Work Note (Signed)
CSW noted in chart review that PT has recommended SNF for patient. CSW met with patient and she is refusing to go to SNF. CSW tried to explain benefits of SNF but patient states that she wants to go home and does not have any desire to go to a facility. CSW notified RN CM of above. CSW signing off. Please re consult if further needs arise.   [place], [place]

## 2018-01-06 NOTE — Anesthesia Post-op Follow-up Note (Signed)
Anesthesia QCDR form completed.        

## 2018-01-06 NOTE — Anesthesia Postprocedure Evaluation (Signed)
Anesthesia Post Note  Patient: [name]  Procedure(s) Performed: IRRIGATION AND DEBRIDEMENT FOOT (Left ) APPLICATION OF WOUND VAC (Left )  Patient location during evaluation: PACU Anesthesia Type: General Level of consciousness: awake and alert Pain management: pain level controlled Vital Signs Assessment: post-procedure vital signs reviewed and stable Respiratory status: spontaneous breathing and respiratory function stable Cardiovascular status: stable Anesthetic complications: no     Last Vitals:  Vitals:   01/06/18 0923 01/06/18 0937  BP: (!) 129/50 (!) 133/43  Pulse: (!) 55   Resp: 13   Temp: 36.7 C   SpO2: 94%     Last Pain:  Vitals:   01/06/18 0937  TempSrc:   PainSc: 5                  KEPHART,WILLIAM K

## 2018-01-06 NOTE — Progress Notes (Signed)
PT Cancellation Note  Patient Details Name: [name] MRN: [id number] DOB: August 05, 1940   Cancelled Treatment:    Reason Eval/Treat Not Completed: Other (comment). Currently out of room for procedure. Will hold at this time and re-attempt.   [name] 01/06/2018, 9:37 AM  Greggory Stallion, PT, DPT [phone]

## 2018-01-06 NOTE — Progress Notes (Addendum)
Pharmacy Antibiotic Note  [name] is a 77 y.o. female admitted on 01/02/2018 with Wound- purulent , necrotic on L leg.Marland Kitchen  Pharmacy has been consulted for Vancomycin and Meropenem dosing.  Patient at [facility] 6/16-6/18/19 for AKI, fall  NOTE ALLERGIES  Allergies  Allergen Reactions  . Ciprofloxacin Hcl Anaphylaxis  . Penicillins Anaphylaxis    Near death experience,  Has patient had a PCN reaction causing immediate rash, facial/tongue/throat swelling, SOB or lightheadedness with hypotension: Yes Has patient had a PCN reaction causing severe rash involving mucus membranes or skin necrosis: No Has patient had a PCN reaction that required hospitalization: Yes Has patient had a PCN reaction occurring within the last 10 years: No If all of the above answers are "NO", then may proceed with Cephalosporin use.  . Amlodipine     Leg swelling  . Ceclor [Cefaclor] Nausea And Vomiting  . Codeine Nausea Only and Other (See Comments)    Cramps, nausea  . Lipitor [Atorvastatin] Other (See Comments)    Myalgias   . Sulfa Antibiotics Rash         Plan: 01/04/18 10:00 serum creatinine has further improved to 1.72 mg/dL. Increase vancomycin one more time to 1 gm IV Q24H, predicted trough 16 mcg/mL. Will check vancomycin trough 01/06/18 at 09:30. Meropenem as ordered.   01/03/18 07:25 serum creatinine has improved from 2.94 to 2.26 mg/dL, which is about baseline according to H&P. Will check vancomycin random now and if it's less than 20 mcg/ml we will start vancomycin 1.25 gm IV Q36H for wound infection/cellulitis. Vd 53.3 L, Ke 0.025 hr-1, T1/2 27.5 hr, predicted trough 15 mcg/ml.   Will increase meropenem to 1000 mg IV Q12h for Crcl >25 ml/min. Pharmacy note for nursing to monitor patient d/t hx of allergy to PCN.  07/05 @ 0621 VT 23 drawn 2.5 hours too early. True trough around 20 mcg/mL. Will continue current regimen and recheck next VT 07/06 @ 0900. Scr has risen slightly: 1.33 >> 1.45 will  monitor renal function w/ am labs.  Height: 5\' 7"  (170.2 cm) Weight: 221 lb 1.6 oz (100.3 kg) IBW/kg (Calculated) : 61.6  Temp (24hrs), Avg:98.4 F (36.9 C), Min:98 F (36.7 C), Max:99.1 F (37.3 C)  Recent Labs  Lab 01/02/18 1338 01/03/18 0555 01/04/18 0417 01/05/18 0339 01/06/18 0553  WBC 4.9 4.1 5.7  --   --   CREATININE 2.94* 2.26* 1.72* 1.33* 1.45*  VANCORANDOM  --  8  --   --   --     Estimated Creatinine Clearance: 39.5 mL/min (A) (by C-G formula based on SCr of 1.45 mg/dL (H)).    Allergies  Allergen Reactions  . Ciprofloxacin Hcl Anaphylaxis  . Penicillins Anaphylaxis    Near death experience,  Has patient had a PCN reaction causing immediate rash, facial/tongue/throat swelling, SOB or lightheadedness with hypotension: Yes Has patient had a PCN reaction causing severe rash involving mucus membranes or skin necrosis: No Has patient had a PCN reaction that required hospitalization: Yes Has patient had a PCN reaction occurring within the last 10 years: No If all of the above answers are "NO", then may proceed with Cephalosporin use.  . Amlodipine     Leg swelling  . Ceclor [Cefaclor] Nausea And Vomiting  . Codeine Nausea Only and Other (See Comments)    Cramps, nausea  . Lipitor [Atorvastatin] Other (See Comments)    Myalgias   . Sulfa Antibiotics Rash         Antimicrobials this admission:  Vanc  7/1 >>   Meropenem  7/1 >>    Dose adjustments this admission:    Microbiology results:   BCx:     UCx:      Sputum:      MRSA PCR:   7/1 Wound cx: pending  Thank you for allowing pharmacy to be a part of this patient's care.  Tobie Lords, Pharm.D., BCPS Clinical Pharmacist 01/06/2018 7:02 AM

## 2018-01-06 NOTE — Transfer of Care (Signed)
Immediate Anesthesia Transfer of Care Note  Patient: [name]  Procedure(s) Performed: IRRIGATION AND DEBRIDEMENT FOOT (Left ) APPLICATION OF WOUND VAC (Left )  Patient Location: PACU  Anesthesia Type:General  Level of Consciousness: awake, alert  and oriented  Airway & Oxygen Therapy: Patient Spontanous Breathing and Patient connected to face mask oxygen  Post-op Assessment: Report given to RN and Post -op Vital signs reviewed and stable  Post vital signs: Reviewed and stable  Last Vitals:  Vitals Value Taken Time  BP    Temp    Pulse    Resp    SpO2      Last Pain:  Vitals:   01/06/18 0342  TempSrc: Oral  PainSc:          Complications: No apparent anesthesia complications

## 2018-01-06 NOTE — Progress Notes (Signed)
Pharmacy Antibiotic Note  [name] is a 77 y.o. female admitted on 01/02/2018 with Wound- purulent , necrotic on L leg.Marland Kitchen  Pharmacy has been consulted for Vancomycin and Meropenem dosing.  Patient at [facility] 6/16-6/18/19 for AKI, fall  NOTE ALLERGIES  Allergies  Allergen Reactions  . Ciprofloxacin Hcl Anaphylaxis  . Penicillins Anaphylaxis    Near death experience,  Has patient had a PCN reaction causing immediate rash, facial/tongue/throat swelling, SOB or lightheadedness with hypotension: Yes Has patient had a PCN reaction causing severe rash involving mucus membranes or skin necrosis: No Has patient had a PCN reaction that required hospitalization: Yes Has patient had a PCN reaction occurring within the last 10 years: No If all of the above answers are "NO", then may proceed with Cephalosporin use.  . Amlodipine     Leg swelling  . Ceclor [Cefaclor] Nausea And Vomiting  . Codeine Nausea Only and Other (See Comments)    Cramps, nausea  . Lipitor [Atorvastatin] Other (See Comments)    Myalgias   . Sulfa Antibiotics Rash         Plan: 01/04/18 10:00 serum creatinine has further improved to 1.72 mg/dL. Increase vancomycin one more time to 1 gm IV Q24H, predicted trough 16 mcg/mL. Will check vancomycin trough 01/06/18 at 09:30. Meropenem as ordered.   01/03/18 07:25 serum creatinine has improved from 2.94 to 2.26 mg/dL, which is about baseline according to H&P. Will check vancomycin random now and if it's less than 20 mcg/ml we will start vancomycin 1.25 gm IV Q36H for wound infection/cellulitis. Vd 53.3 L, Ke 0.025 hr-1, T1/2 27.5 hr, predicted trough 15 mcg/ml.   Will increase meropenem to 1000 mg IV Q12h for Crcl >25 ml/min. Pharmacy note for nursing to monitor patient d/t hx of allergy to PCN.  07/05 @ 0621 VT 23 drawn 2.5 hours too early. True trough around 20 mcg/mL. Will continue current regimen and recheck next VT 07/06 @ 0900. Scr has risen slightly: 1.33 >> 1.45 will  monitor renal function w/ am labs.  01/06/2018 10:08 vancomycin trough 20 mcg/ml. Recalculated Ke 0.037 hr-1. Renal function has improved since admission and is not stable - continue vancomycin 1 gm IV Q24H and recheck vancomycin level Monday 01/09/18.   Height: 5\' 7"  (170.2 cm) Weight: 221 lb 1.6 oz (100.3 kg) IBW/kg (Calculated) : 61.6  Temp (24hrs), Avg:98.2 F (36.8 C), Min:98 F (36.7 C), Max:98.5 F (36.9 C)  Recent Labs  Lab 01/02/18 1338 01/03/18 0555 01/04/18 0417 01/05/18 0339 01/06/18 0553 01/06/18 0621 01/06/18 1008  WBC 4.9 4.1 5.7  --   --   --   --   CREATININE 2.94* 2.26* 1.72* 1.33* 1.45*  --   --   VANCOTROUGH  --   --   --   --   --  23* 20  VANCORANDOM  --  8  --   --   --   --   --     Estimated Creatinine Clearance: 39.5 mL/min (A) (by C-G formula based on SCr of 1.45 mg/dL (H)).    Allergies  Allergen Reactions  . Ciprofloxacin Hcl Anaphylaxis  . Penicillins Anaphylaxis    Near death experience,  Has patient had a PCN reaction causing immediate rash, facial/tongue/throat swelling, SOB or lightheadedness with hypotension: Yes Has patient had a PCN reaction causing severe rash involving mucus membranes or skin necrosis: No Has patient had a PCN reaction that required hospitalization: Yes Has patient had a PCN reaction occurring within the  last 10 years: No If all of the above answers are "NO", then may proceed with Cephalosporin use.  . Amlodipine     Leg swelling  . Ceclor [Cefaclor] Nausea And Vomiting  . Codeine Nausea Only and Other (See Comments)    Cramps, nausea  . Lipitor [Atorvastatin] Other (See Comments)    Myalgias   . Sulfa Antibiotics Rash         Antimicrobials this admission: Vanc  7/1 >>   Meropenem  7/1 >>    Dose adjustments this admission:    Microbiology results:   BCx:     UCx:      Sputum:      MRSA PCR:   7/1 Wound cx: pending  Thank you for allowing pharmacy to be a part of this patient's care.  Laural Benes, Pharm.D., BCPS Clinical Pharmacist 01/06/2018 10:55 AM

## 2018-01-06 NOTE — Evaluation (Signed)
Physical Therapy Evaluation Patient Details Name: [name] MRN: [id number] DOB: Nov 15, 1940 Today's Date: 01/06/2018   History of Present Illness  Pt presents to hospital on 01/02/18 with complaints of SOB and a weeping wound on her L LE. Pt subsequently diagnosed with acute renal failure. Pt had excissional debridement of would on L LE. pt has a past medical history that includes CKD, CAD, PVD, COPD, GERD, and HTN    Clinical Impression  Pt is a pleasant 77 year old female who was admitted for acute renal failure. Pt performs bed mobility, transfers, and ambulation with min assist. Pt demonstrates deficits with strength, mobility, and balance. Pt amb 3' to the chair beside the bed with min assist and RW to maintain balance. Pt appears very unsteady on feet demonstrating a posterior lean during standing and during amb. Pt declines pain at time of session. Pt states upon arrival that she is very tired and requires cuing throughout session to keep her eyes open. Pt instructed in there-ex which she tolerates well. Pt at this time is limited by lethargy and by unsteadiness during gait. Pt could benefit from continued skilled therapy at this time to improve deficits toward PLOF. PT will continue to work with pt at least 2x/week while admitted. D/c recommendations at this time are SNF due to pts level of deficits as well as distance from her PLOF.      Follow Up Recommendations SNF    Equipment Recommendations  None recommended by PT    Recommendations for Other Services       Precautions / Restrictions Precautions Precautions: Fall Restrictions Weight Bearing Restrictions: No      Mobility  Bed Mobility Overal bed mobility: Independent             General bed mobility comments: pt independent with bed mobility including scooting, rolling, and supine>sit  Transfers Overall transfer level: Needs assistance Equipment used: Rolling walker (2 wheeled) Transfers: Sit to/from  Stand Sit to Stand: Min assist         General transfer comment: Pt requires min assist and 2 attempts to come to standing as well as repeated cues for safe hand placement during push-off. Posterior LOB initially but improves with repeated attempts. Pt requires min assist to maintain upright posture and prevent posterior LOB  Ambulation/Gait Ambulation/Gait assistance: Min assist Gait Distance (Feet): 3 Feet Assistive device: Rolling walker (2 wheeled) Gait Pattern/deviations: Shuffle(posterior lean)     General Gait Details: pt amb 3' with min assist and RW. pt demonstrates unsteady gait and posterior lean. Pt requires continued verbal cuing for hand placement and safety. Further distances not attempted at this time due to lethargy. Pt states that she is very tired and thinks she could do better after a nap.  Stairs            Wheelchair Mobility    Modified Rankin (Stroke Patients Only)       Balance Overall balance assessment: Needs assistance   Sitting balance-Leahy Scale: Good Sitting balance - Comments: sits with B feet on floor and no UE support without gross LOB     Standing balance-Leahy Scale: Fair Standing balance comment: Pt requires B UE support and min assist in standing to maintain upright posture. Pt tends to lean posteriorly.                             Pertinent Vitals/Pain Pain Assessment: No/denies pain    Home  Living Family/patient expects to be discharged to:: Private residence Living Arrangements: Spouse/significant other Available Help at Discharge: Family Type of Home: House Home Access: Stairs to enter Entrance Stairs-Rails: Can reach both Entrance Stairs-Number of Steps: [place]: One level Home Equipment: [place] - 2 wheels;Cane - single point;Bedside commode;Wheelchair - manual      Prior Function Level of Independence: Independent with assistive device(s)         Comments: pt amb with cane outside the home  and depends on furniture for stability walking within the home. Pt has past history of multiple falls. Pt states that she regularly ambulates outside and enjoys gardening.     Hand Dominance        Extremity/Trunk Assessment   Upper Extremity Assessment Upper Extremity Assessment: Overall WFL for tasks assessed    Lower Extremity Assessment Lower Extremity Assessment: RLE deficits/detail;LLE deficits/detail RLE Deficits / Details: Grossly at least 4/5; hip flexion, knee extension, knee flexion, DF 4/5 RLE Sensation: WNL LLE Deficits / Details: Grossly at least 4/5; hip flexion, knee extension, knee flexion, DF 4/5. Pt does not complain of any pain with MMT LLE Sensation: WNL       Communication   Communication: No difficulties  Cognition Arousal/Alertness: Awake/alert;Lethargic Behavior During Therapy: WFL for tasks assessed/performed Overall Cognitive Status: Within Functional Limits for tasks assessed                                        General Comments      Exercises Other Exercises Other Exercises: pt instructed in B LE exercises including SLR, LAQ, hip abd, seated heel raises, seated marching and standing marching x10.   Assessment/Plan    PT Assessment Patient needs continued PT services  PT Problem List Decreased strength;Decreased activity tolerance;Decreased mobility;Decreased balance;Decreased safety awareness;Decreased knowledge of use of DME       PT Treatment Interventions DME instruction;Gait training;Functional mobility training;Therapeutic activities;Therapeutic exercise;Balance training;Patient/family education    PT Goals (Current goals can be found in the Care Plan section)  Acute Rehab PT Goals Patient Stated Goal: Return to prior function and decrease her falls PT Goal Formulation: With patient/family Time For Goal Achievement: 01/20/18 Potential to Achieve Goals: Fair    Frequency Min 2X/week   Barriers to discharge         Co-evaluation               AM-PAC PT "6 Clicks" Daily Activity  Outcome Measure Difficulty turning over in bed (including adjusting bedclothes, sheets and blankets)?: None Difficulty moving from lying on back to sitting on the side of the bed? : None Difficulty sitting down on and standing up from a chair with arms (e.g., wheelchair, bedside commode, etc,.)?: Unable Help needed moving to and from a bed to chair (including a wheelchair)?: A Little Help needed walking in hospital room?: A Little Help needed climbing 3-5 steps with a railing? : A Lot 6 Click Score: 17    End of Session Equipment Utilized During Treatment: Gait belt Activity Tolerance: Patient tolerated treatment well;Patient limited by lethargy Patient left: in chair;with call bell/phone within reach;with chair alarm set   PT Visit Diagnosis: Unsteadiness on feet (R26.81);Muscle weakness (generalized) (M62.81);History of falling (Z91.81);Difficulty in walking, not elsewhere classified (R26.2);Repeated falls (R29.6)    Time: 6283-1517 PT Time Calculation (min) (ACUTE ONLY): 30 min   Charges:  PT G Codes:        Temple Sporer Marylu Lund, SPT  Mellissa Conley 01/06/2018, 3:55 PM

## 2018-01-06 NOTE — Anesthesia Procedure Notes (Signed)
Date/Time: 01/06/2018 7:53 AM Performed by: Nelda Marseille, CRNA Pre-anesthesia Checklist: Patient identified, Emergency Drugs available, Suction available, Patient being monitored and Timeout performed Oxygen Delivery Method: Simple face mask

## 2018-01-06 NOTE — Progress Notes (Signed)
Patient ID: [name], female   DOB: 1941/05/24, 77 y.o.   MRN: [id number]  [employer] PROGRESS NOTE  MAGALLY VAHLE VVO:[id number] DOB: [birth year] DOA: 01/02/2018 PCP: Lemmie Evens, MD  HPI/Subjective: Patient feels okay.  Some shortness of breath.  Some cough.  Does not like the hospital food.  Objective: Vitals:   01/06/18 0937 01/06/18 0955  BP: (!) 133/43 (!) 132/52  Pulse:  (!) 54  Resp:  18  Temp:    SpO2:  93%    Filed Weights   01/02/18 1740 01/03/18 0359 01/04/18 0427  Weight: 98.1 kg (216 lb 4.3 oz) 98.8 kg (217 lb 12.8 oz) 100.3 kg (221 lb 1.6 oz)    ROS: Review of Systems  Constitutional: Negative for chills and fever.  Eyes: Negative for blurred vision.  Respiratory: Positive for cough and shortness of breath.   Cardiovascular: Negative for chest pain.  Gastrointestinal: Negative for abdominal pain, constipation, diarrhea, nausea and vomiting.  Genitourinary: Negative for dysuria.  Musculoskeletal: Positive for joint pain.  Neurological: Negative for dizziness and headaches.   Exam: Physical Exam  Constitutional: She is oriented to person, place, and time.  HENT:  Nose: No mucosal edema.  Mouth/Throat: No oropharyngeal exudate or posterior oropharyngeal edema.  Eyes: Pupils are equal, round, and reactive to light. Conjunctivae, EOM and lids are normal.  Neck: No JVD present. Carotid bruit is not present. No edema present. No thyroid mass and no thyromegaly present.  Cardiovascular: S1 normal and S2 normal. Exam reveals no gallop.  No murmur heard. Respiratory: No respiratory distress. She has decreased breath sounds in the right lower field and the left lower field. She has wheezes in the right middle field, the right lower field, the left middle field and the left lower field. She has no rhonchi. She has no rales.  GI: Soft. Bowel sounds are normal. There is no tenderness.  Musculoskeletal:       Right ankle: She exhibits swelling.       Left  ankle: She exhibits swelling.  Lymphadenopathy:    She has no cervical adenopathy.  Neurological: She is alert and oriented to person, place, and time. No cranial nerve deficit.  Skin: Skin is warm. Nails show no clubbing.  Large ulcer left leg which is pretty deep  Psychiatric: She has a normal mood and affect.      Data Reviewed: Basic Metabolic Panel: Recent Labs  Lab 01/02/18 1338 01/03/18 0555 01/04/18 0417 01/05/18 0339 01/06/18 0553  NA 131* 133* 134* 137 137  K 4.8 4.5 4.6 4.6 4.8  CL 97* 99 102 103 101  CO2 26 26 26 29 28   GLUCOSE 111* 91 90 93 164*  BUN 54* 48* 40* 29* 29*  CREATININE 2.94* 2.26* 1.72* 1.33* 1.45*  CALCIUM 8.7* 8.5* 8.7* 9.2 9.2   Liver Function Tests: Recent Labs  Lab 01/02/18 1338  AST 33  ALT 22  ALKPHOS 82  BILITOT 0.8  PROT 6.4*  ALBUMIN 3.3*   CBC: Recent Labs  Lab 01/02/18 1338 01/03/18 0555 01/04/18 0417  WBC 4.9 4.1 5.7  NEUTROABS 3.5  --   --   HGB 8.5* 7.3* 8.4*  HCT 25.6* 22.3* 25.0*  MCV 106.0* 104.8* 102.5*  PLT 305 265 264   Cardiac Enzymes: Recent Labs  Lab 01/02/18 1338  TROPONINI 0.03*     Recent Results (from the past 240 hour(s))  Aerobic/Anaerobic Culture (surgical/deep wound)     Status: None (Preliminary result)   Collection Time:  01/02/18  1:47 PM  Result Value Ref Range Status   Specimen Description   Final    LEG Performed at [facility], [address] [place], [place] [address]    Special Requests PENDING  Incomplete   Gram Stain   Final    FEW WBC PRESENT, PREDOMINANTLY PMN ABUNDANT GRAM NEGATIVE RODS FEW GRAM POSITIVE COCCI    Culture   Final    CULTURE REINCUBATED FOR BETTER GROWTH NO ANAEROBES ISOLATED; CULTURE IN PROGRESS FOR 5 DAYS    Report Status PENDING  Incomplete     Scheduled Meds: . budesonide (PULMICORT) nebulizer solution  0.5 mg Nebulization BID  . calcitRIOL  0.5 mcg Oral Daily  . cholecalciferol  2,000 Units Oral Daily  . clonazePAM  0.5 mg Oral  TID   And  . clonazePAM  1.5 mg Oral QHS  . ezetimibe  10 mg Oral Daily  . famotidine  20 mg Oral QHS  . furosemide  20 mg Intravenous BID  . heparin  5,000 Units Subcutaneous Q8H  . hydrALAZINE  50 mg Oral TID  . ipratropium-albuterol  3 mL Nebulization Q6H  . isosorbide mononitrate  30 mg Oral Daily  . levothyroxine  125 mcg Oral Daily  . methylPREDNISolone (SOLU-MEDROL) injection  40 mg Intravenous Q12H  . omega-3 acid ethyl esters  2 g Oral BID  . sodium chloride flush  3 mL Intravenous Q12H  . vitamin C  1,000 mg Oral Daily   Continuous Infusions: . meropenem (MERREM) IV Stopped (01/06/18 0615)  . vancomycin Stopped (01/06/18 1225)    Assessment/Plan:  1.  Large necrotic left leg wound with cellulitis.  After a fall.   patient had debridement in the operating room today and wound VAC placed.  Since wound culture  has some gram-negative rods and some gram-positive cocci I will switch antibiotics from vancomycin and meropenem over to doxycycline. 2.  Acute kidney injury on chronic kidney disease stage III.  Creatinine has improved to 1.45 today. 3.  Chronic diastolic congestive heart failure.  Switch to oral Lasix.Marland Kitchen 4.  COPD exacerbation.  Continue Solu-Medrol and nebulizer treatments.  Patient would better air entry today. 5.  Atrial fibrillation chronic.  Pradaxa on hold since last admission sent secondary to anemia. 6.  Anemia requiring transfusion on this hospital stay.  Continue to hold blood thinner. 7.  Hypertension on hydralazine and Imdur 8.  Hypothyroidism unspecified on Synthroid 9.  Morbid obesity with a BMI of 43.1. 10.  Weakness physical therapy evaluation.  Code Status:     Code Status Orders  (From admission, onward)        Start     Ordered   01/02/18 1735  Full code  Continuous     01/02/18 1735    Code Status History    Date Active Date Inactive Code Status Order ID Comments User Context   12/19/2017 0332 12/20/2017 1958 Full Code [id number]   Harrie Foreman, MD Inpatient    Advance Directive Documentation     Most Recent Value  Type of Advance Directive  Healthcare Power of Attorney  Pre-existing out of facility DNR order (yellow form or pink MOST form)  -  "MOST" Form in Place?  -      Disposition Plan:  to be determined.  Consultants:  Podiatry  Vascular surgery  Nephrology  Cardiology  Antibiotics: -Doxycycline   Time spent: 28 minutes  [place]

## 2018-01-07 LAB — BASIC METABOLIC PANEL
Anion gap: 6 (ref 5–15)
BUN: 36 mg/dL — ABNORMAL HIGH (ref 8–23)
CO2: 29 mmol/L (ref 22–32)
Calcium: 8.9 mg/dL (ref 8.9–10.3)
Chloride: 99 mmol/L (ref 98–111)
Creatinine, Ser: 2.18 mg/dL — ABNORMAL HIGH (ref 0.44–1.00)
GFR calc Af Amer: 24 mL/min — ABNORMAL LOW (ref >60)
GFR calc non Af Amer: 21 mL/min — ABNORMAL LOW (ref >60)
Glucose, Bld: 132 mg/dL — ABNORMAL HIGH (ref 70–99)
Potassium: 5.2 mmol/L — ABNORMAL HIGH (ref 3.5–5.1)
Sodium: 134 mmol/L — ABNORMAL LOW (ref 135–145)

## 2018-01-07 MED ORDER — SODIUM CHLORIDE 0.9 % IV BOLUS
500.0000 mL | Freq: Once | INTRAVENOUS | Status: AC
  Administered 2018-01-08: 500 mL via INTRAVENOUS

## 2018-01-07 NOTE — Progress Notes (Signed)
Pt resting comfortably Wound vac in place and working well. Recommend continued use of wound vac  OK to d/c from podiatry standpoint. WBAT. F/U with me in 2 weeks in outpt clinic.

## 2018-01-07 NOTE — Progress Notes (Signed)
Patient ID: [name], female   DOB: Nov 16, 1940, 77 y.o.   MRN: [id number]  [facility] PROGRESS NOTE  Kim MCMANNIS YIR:[id number] DOB: 10-14-1940 DOA: 01/02/2018 PCP: Lemmie Evens, MD  HPI/Subjective: Patient states she always wheezes even when she is doing well.  Eating better.  Still has some cough and shortness of breath.  Objective: Vitals:   01/07/18 0811 01/07/18 1348  BP: (!) 134/44   Pulse: (!) 56   Resp: 18   Temp: 98.4 F (36.9 C)   SpO2: 96% 94%    Filed Weights   01/02/18 1740 01/03/18 0359 01/04/18 0427  Weight: 98.1 kg (216 lb 4.3 oz) 98.8 kg (217 lb 12.8 oz) 100.3 kg (221 lb 1.6 oz)    ROS: Review of Systems  Constitutional: Negative for chills and fever.  Eyes: Negative for blurred vision.  Respiratory: Positive for cough and shortness of breath.   Cardiovascular: Negative for chest pain.  Gastrointestinal: Negative for abdominal pain, constipation, diarrhea, nausea and vomiting.  Genitourinary: Negative for dysuria.  Musculoskeletal: Positive for joint pain.  Neurological: Negative for dizziness and headaches.   Exam: Physical Exam  Constitutional: She is oriented to person, place, and time.  HENT:  Nose: No mucosal edema.  Mouth/Throat: No oropharyngeal exudate or posterior oropharyngeal edema.  Eyes: Pupils are equal, round, and reactive to light. Conjunctivae, EOM and lids are normal.  Neck: No JVD present. Carotid bruit is not present. No edema present. No thyroid mass and no thyromegaly present.  Cardiovascular: S1 normal and S2 normal. Exam reveals no gallop.  No murmur heard. Respiratory: No respiratory distress. She has decreased breath sounds in the right lower field and the left lower field. She has wheezes in the right middle field, the right lower field, the left middle field and the left lower field. She has no rhonchi. She has no rales.  GI: Soft. Bowel sounds are normal. There is no tenderness.  Musculoskeletal:       Right  ankle: She exhibits swelling.       Left ankle: She exhibits swelling.  Lymphadenopathy:    She has no cervical adenopathy.  Neurological: She is alert and oriented to person, place, and time. No cranial nerve deficit.  Skin: Skin is warm. Nails show no clubbing.  Large ulcer left leg which is pretty deep  Psychiatric: She has a normal mood and affect.      Data Reviewed: Basic Metabolic Panel: Recent Labs  Lab 01/03/18 0555 01/04/18 0417 01/05/18 0339 01/06/18 0553 01/07/18 0419  NA 133* 134* 137 137 134*  K 4.5 4.6 4.6 4.8 5.2*  CL 99 102 103 101 99  CO2 26 26 29 28 29   GLUCOSE 91 90 93 164* 132*  BUN 48* 40* 29* 29* 36*  CREATININE 2.26* 1.72* 1.33* 1.45* 2.18*  CALCIUM 8.5* 8.7* 9.2 9.2 8.9   Liver Function Tests: Recent Labs  Lab 01/02/18 1338  AST 33  ALT 22  ALKPHOS 82  BILITOT 0.8  PROT 6.4*  ALBUMIN 3.3*   CBC: Recent Labs  Lab 01/02/18 1338 01/03/18 0555 01/04/18 0417  WBC 4.9 4.1 5.7  NEUTROABS 3.5  --   --   HGB 8.5* 7.3* 8.4*  HCT 25.6* 22.3* 25.0*  MCV 106.0* 104.8* 102.5*  PLT 305 265 264   Cardiac Enzymes: Recent Labs  Lab 01/02/18 1338  TROPONINI 0.03*     Recent Results (from the past 240 hour(s))  Aerobic/Anaerobic Culture (surgical/deep wound)     Status:  Abnormal (Preliminary result)   Collection Time: 01/02/18  1:47 PM  Result Value Ref Range Status   Specimen Description   Final    LEG Performed at [facility], [address] [place], [place] [address]    Special Requests PENDING  Incomplete   Gram Stain   Final    FEW WBC PRESENT, PREDOMINANTLY PMN ABUNDANT GRAM NEGATIVE RODS FEW GRAM POSITIVE COCCI    Culture (A)  Final    MULTIPLE ORGANISMS PRESENT, NONE PREDOMINANT MIXED ANAEROBIC FLORA PRESENT.  CALL LAB IF FURTHER IID REQUIRED.    Report Status PENDING  Incomplete     Scheduled Meds: . budesonide (PULMICORT) nebulizer solution  0.5 mg Nebulization BID  . calcitRIOL  0.5 mcg Oral Daily  .  cholecalciferol  2,000 Units Oral Daily  . clonazePAM  0.5 mg Oral TID   And  . clonazePAM  1.5 mg Oral QHS  . doxycycline  100 mg Oral BID WC  . ezetimibe  10 mg Oral Daily  . famotidine  20 mg Oral QHS  . heparin  5,000 Units Subcutaneous Q8H  . hydrALAZINE  50 mg Oral TID  . ipratropium-albuterol  3 mL Nebulization Q6H  . isosorbide mononitrate  30 mg Oral Daily  . levothyroxine  125 mcg Oral Daily  . methylPREDNISolone (SOLU-MEDROL) injection  40 mg Intravenous Q12H  . omega-3 acid ethyl esters  2 g Oral BID  . sodium chloride flush  3 mL Intravenous Q12H  . vitamin C  1,000 mg Oral Daily   Continuous Infusions: . sodium chloride      Assessment/Plan:  1.  Large necrotic left leg wound with cellulitis.  After a fall.   patient had debridement in the operating room today and wound VAC placed.  Since wound culture  has some gram-negative rods and some gram-positive cocci I will switch antibiotics from vancomycin and meropenem over to doxycycline. 2.  Acute kidney injury on chronic kidney disease stage III.  Creatinine has worsened to 2.18.  Will hold Lasix and give a fluid bolus today. 3.  Chronic diastolic congestive heart failure.   hold Lasix today with increased kidney function. 4.  COPD exacerbation.  Continue Solu-Medrol and nebulizer treatments.  Patient still with wheezing. 5.  Atrial fibrillation chronic.  Pradaxa on hold since last admission sent secondary to anemia. 6.  Anemia requiring transfusion on this hospital stay.  Continue to hold blood thinner. 7.  Hypertension on hydralazine and Imdur 8.  Hypothyroidism unspecified on Synthroid 9.  Morbid obesity with a BMI of 43.1. 10.  Weakness.  Continued physical therapy evaluation in the hospital needed because the patient does not want to go to rehab and she wants to go home with home health.  Dr. Vickki Muff signed the form for wound VAC at home.  Code Status:     Code Status Orders  (From admission, onward)         Start     Ordered   01/02/18 1735  Full code  Continuous     01/02/18 1735    Code Status History    Date Active Date Inactive Code Status Order ID Comments User Context   12/19/2017 0332 12/20/2017 1958 Full Code [id number]  Harrie Foreman, MD Inpatient    Advance Directive Documentation     Most Recent Value  Type of Advance Directive  Healthcare Power of Attorney  Pre-existing out of facility DNR order (yellow form or pink MOST form)  -  "MOST" Form  in Place?  -      Disposition Plan: Patient wants to go home with home health.  Would like to see the patient a little bit stronger prior to disposition.  Insurance company will have to approve the wound VAC.  Would like to see kidney function improved a little bit prior to disposition.  Consultants:  Podiatry  Vascular surgery  Nephrology  Cardiology  Antibiotics: -Doxycycline   Time spent: 27 minutes  [place]

## 2018-01-08 LAB — BASIC METABOLIC PANEL
ANION GAP: 6 (ref 5–15)
BUN: 49 mg/dL — ABNORMAL HIGH (ref 8–23)
CO2: 28 mmol/L (ref 22–32)
Calcium: 9 mg/dL (ref 8.9–10.3)
Chloride: 98 mmol/L (ref 98–111)
Creatinine, Ser: 2.5 mg/dL — ABNORMAL HIGH (ref 0.44–1.00)
GFR calc Af Amer: 20 mL/min — ABNORMAL LOW (ref >60)
GFR, EST NON AFRICAN AMERICAN: 17 mL/min — AB (ref >60)
GLUCOSE: 124 mg/dL — AB (ref 70–99)
Potassium: 5.3 mmol/L — ABNORMAL HIGH (ref 3.5–5.1)
Sodium: 132 mmol/L — ABNORMAL LOW (ref 135–145)

## 2018-01-08 LAB — AEROBIC/ANAEROBIC CULTURE W GRAM STAIN (SURGICAL/DEEP WOUND)

## 2018-01-08 LAB — GLUCOSE, CAPILLARY: Glucose-Capillary: 126 mg/dL — ABNORMAL HIGH (ref 70–99)

## 2018-01-08 LAB — AEROBIC/ANAEROBIC CULTURE (SURGICAL/DEEP WOUND)

## 2018-01-08 MED ORDER — SODIUM CHLORIDE 0.9 % IV BOLUS
500.0000 mL | Freq: Once | INTRAVENOUS | Status: AC
  Administered 2018-01-09: 500 mL via INTRAVENOUS

## 2018-01-08 MED ORDER — IPRATROPIUM-ALBUTEROL 0.5-2.5 (3) MG/3ML IN SOLN: 3.0000 mL | RESPIRATORY_TRACT | Status: DC | PRN

## 2018-01-08 MED ORDER — IPRATROPIUM-ALBUTEROL 0.5-2.5 (3) MG/3ML IN SOLN
3.0000 mL | Freq: Four times a day (QID) | RESPIRATORY_TRACT | Status: DC
  Administered 2018-01-08 – 2018-01-10 (×8): 3 mL via RESPIRATORY_TRACT
  Filled 2018-01-08 (×10): qty 3

## 2018-01-08 NOTE — Progress Notes (Signed)
Patient ID: [name], female   DOB: Jul 25, 1940, 77 y.o.   MRN: [id number]  [facility] PROGRESS NOTE  [name] DVV:[id number] DOB: [birth year] DOA: 01/02/2018 PCP: Lemmie Evens, MD  HPI/Subjective: Patient states she always wheezes even when she is doing well.  Little bit of a cough.  States her appetite is okay.  Objective: Vitals:   01/08/18 0916 01/08/18 1158  BP: (!) 133/52   Pulse: 63   Resp: 20   Temp:    SpO2: 96% 96%    Filed Weights   01/02/18 1740 01/03/18 0359 01/04/18 0427  Weight: 98.1 kg (216 lb 4.3 oz) 98.8 kg (217 lb 12.8 oz) 100.3 kg (221 lb 1.6 oz)    ROS: Review of Systems  Constitutional: Negative for chills and fever.  Eyes: Negative for blurred vision.  Respiratory: Positive for cough and shortness of breath.   Cardiovascular: Negative for chest pain.  Gastrointestinal: Negative for abdominal pain, constipation, diarrhea, nausea and vomiting.  Genitourinary: Negative for dysuria.  Musculoskeletal: Positive for joint pain.  Neurological: Negative for dizziness and headaches.   Exam: Physical Exam  Constitutional: She is oriented to person, place, and time.  HENT:  Nose: No mucosal edema.  Mouth/Throat: No oropharyngeal exudate or posterior oropharyngeal edema.  Eyes: Pupils are equal, round, and reactive to light. Conjunctivae, EOM and lids are normal.  Neck: No JVD present. Carotid bruit is not present. No edema present. No thyroid mass and no thyromegaly present.  Cardiovascular: S1 normal and S2 normal. Exam reveals no gallop.  No murmur heard. Respiratory: No respiratory distress. She has decreased breath sounds in the right lower field and the left lower field. She has wheezes in the right middle field, the right lower field, the left middle field and the left lower field. She has no rhonchi. She has no rales.  GI: Soft. Bowel sounds are normal. There is no tenderness.  Musculoskeletal:       Right ankle: She exhibits swelling.        Left ankle: She exhibits swelling.  Lymphadenopathy:    She has no cervical adenopathy.  Neurological: She is alert and oriented to person, place, and time. No cranial nerve deficit.  Skin: Skin is warm. Nails show no clubbing.  Large ulcer left leg which is pretty deep  Psychiatric: She has a normal mood and affect.      Data Reviewed: Basic Metabolic Panel: Recent Labs  Lab 01/04/18 0417 01/05/18 0339 01/06/18 0553 01/07/18 0419 01/08/18 0439  NA 134* 137 137 134* 132*  K 4.6 4.6 4.8 5.2* 5.3*  CL 102 103 101 99 98  CO2 26 29 28 29 28   GLUCOSE 90 93 164* 132* 124*  BUN 40* 29* 29* 36* 49*  CREATININE 1.72* 1.33* 1.45* 2.18* 2.50*  CALCIUM 8.7* 9.2 9.2 8.9 9.0   Liver Function Tests: Recent Labs  Lab 01/02/18 1338  AST 33  ALT 22  ALKPHOS 82  BILITOT 0.8  PROT 6.4*  ALBUMIN 3.3*   CBC: Recent Labs  Lab 01/02/18 1338 01/03/18 0555 01/04/18 0417  WBC 4.9 4.1 5.7  NEUTROABS 3.5  --   --   HGB 8.5* 7.3* 8.4*  HCT 25.6* 22.3* 25.0*  MCV 106.0* 104.8* 102.5*  PLT 305 265 264   Cardiac Enzymes: Recent Labs  Lab 01/02/18 1338  TROPONINI 0.03*     Recent Results (from the past 240 hour(s))  Aerobic/Anaerobic Culture (surgical/deep wound)     Status: Abnormal (Preliminary result)  Collection Time: 01/02/18  1:47 PM  Result Value Ref Range Status   Specimen Description   Final    LEG Performed at [facility], [address] [place], [place] [address]    Special Requests PENDING  Incomplete   Gram Stain   Final    FEW WBC PRESENT, PREDOMINANTLY PMN ABUNDANT GRAM NEGATIVE RODS FEW GRAM POSITIVE COCCI    Culture (A)  Final    MULTIPLE ORGANISMS PRESENT, NONE PREDOMINANT MIXED ANAEROBIC FLORA PRESENT.  CALL LAB IF FURTHER IID REQUIRED.    Report Status PENDING  Incomplete     Scheduled Meds: . budesonide (PULMICORT) nebulizer solution  0.5 mg Nebulization BID  . calcitRIOL  0.5 mcg Oral Daily  . cholecalciferol  2,000 Units  Oral Daily  . clonazePAM  0.5 mg Oral TID   And  . clonazePAM  1.5 mg Oral QHS  . doxycycline  100 mg Oral BID WC  . ezetimibe  10 mg Oral Daily  . famotidine  20 mg Oral QHS  . heparin  5,000 Units Subcutaneous Q8H  . hydrALAZINE  50 mg Oral TID  . ipratropium-albuterol  3 mL Nebulization QID  . isosorbide mononitrate  30 mg Oral Daily  . levothyroxine  125 mcg Oral Daily  . methylPREDNISolone (SOLU-MEDROL) injection  40 mg Intravenous Q12H  . omega-3 acid ethyl esters  2 g Oral BID  . sodium chloride flush  3 mL Intravenous Q12H  . vitamin C  1,000 mg Oral Daily   Continuous Infusions: . sodium chloride      Assessment/Plan:  1.  Large necrotic left leg wound with cellulitis after a fall.   Patient had debridement in the operating room on 01/06/2018  and wound VAC placed.  Antibiotics switched to doxycycline. 2.  Acute kidney injury on chronic kidney disease stage III.  Creatinine has worsened to 2.5.  Will hold Lasix and give another fluid bolus today. 3.  Chronic diastolic congestive heart failure.   hold Lasix today with increased kidney function. 4.  COPD exacerbation.  Continue Solu-Medrol and nebulizer treatments.  Patient still with wheezing.  Patient states she always wheezes. 5.  Atrial fibrillation chronic.  Pradaxa on hold since last admission sent secondary to anemia. 6.  Anemia requiring transfusion on this hospital stay.  Continue to hold blood thinner. 7.  Hypertension on hydralazine and Imdur 8.  Hypothyroidism unspecified on Synthroid 9.  Morbid obesity with a BMI of 43.1. 10.  Weakness.  Continued physical therapy evaluation in the hospital needed because the patient does not want to go to rehab and she wants to go home with home health.  Dr. Vickki Muff signed the form for wound VAC at home.  Code Status:     Code Status Orders  (From admission, onward)        Start     Ordered   01/02/18 1735  Full code  Continuous     01/02/18 1735    Code Status History     Date Active Date Inactive Code Status Order ID Comments User Context   12/19/2017 0332 12/20/2017 1958 Full Code [id number]  Harrie Foreman, MD Inpatient    Advance Directive Documentation     Most Recent Value  Type of Advance Directive  Healthcare Power of Attorney  Pre-existing out of facility DNR order (yellow form or pink MOST form)  -  "MOST" Form in Place?  -      Disposition Plan: Patient wants to go home with  home health.  Would like to see the patient have another evaluation by physical therapy.  Consultants:  Podiatry  Vascular surgery  Nephrology  Cardiology  Antibiotics: -Doxycycline   Time spent: 28 minutes  [place]

## 2018-01-08 NOTE — Progress Notes (Signed)
[facility], [place] 01/08/18  Subjective:   Patient underwent debridement in OR and wound vac placement on 7/3 Post operatively her Cr has been worsening Vanc level has been in range Appetite is fair, wheezing is better   Objective:  Vital signs in last 24 hours:  Temp:  [98 F (36.7 C)-98.1 F (36.7 C)] 98 F (36.7 C) (07/07 0359) Pulse Rate:  [53-63] 63 (07/07 0916) Resp:  [18-20] 20 (07/07 0916) BP: (121-133)/(39-52) 133/52 (07/07 0916) SpO2:  [94 %-97 %] 96 % (07/07 0916)  Weight change:  Filed Weights   01/02/18 1740 01/03/18 0359 01/04/18 0427  Weight: 98.1 kg (216 lb 4.3 oz) 98.8 kg (217 lb 12.8 oz) 100.3 kg (221 lb 1.6 oz)    Intake/Output:    Intake/Output Summary (Last 24 hours) at 01/08/2018 0947 Last data filed at 01/08/2018 0100 Gross per 24 hour  Intake 600 ml  Output 350 ml  Net 250 ml    Physical Exam: General:  No acute distress, laying in the bed  HEENT  anicteric, moist oral mucous membranes  Neck:  Supple,   Lungs:  Bilateral expiratory wheezing, O2 by [place], mild crackles at bases  Heart::  Soft heart sounds, no rub  Abdomen:  Soft, nontender  Extremities:   trace edema, left leg wound vac  Neurologic:   Alert, oriented, able to answer questions  Skin:  Left leg bandage     Basic Metabolic Panel:  Recent Labs  Lab 01/04/18 0417 01/05/18 0339 01/06/18 0553 01/07/18 0419 01/08/18 0439  NA 134* 137 137 134* 132*  K 4.6 4.6 4.8 5.2* 5.3*  CL 102 103 101 99 98  CO2 26 29 28 29 28   GLUCOSE 90 93 164* 132* 124*  BUN 40* 29* 29* 36* 49*  CREATININE 1.72* 1.33* 1.45* 2.18* 2.50*  CALCIUM 8.7* 9.2 9.2 8.9 9.0     CBC: Recent Labs  Lab 01/02/18 1338 01/03/18 0555 01/04/18 0417  WBC 4.9 4.1 5.7  NEUTROABS 3.5  --   --   HGB 8.5* 7.3* 8.4*  HCT 25.6* 22.3* 25.0*  MCV 106.0* 104.8* 102.5*  PLT 305 265 264     No results found for: HEPBSAG, HEPBSAB, HEPBIGM    Microbiology:  Recent Results (from  the past 240 hour(s))  Aerobic/Anaerobic Culture (surgical/deep wound)     Status: Abnormal (Preliminary result)   Collection Time: 01/02/18  1:47 PM  Result Value Ref Range Status   Specimen Description   Final    LEG Performed at [facility], [address] [place], [place] [address]    Special Requests PENDING  Incomplete   Gram Stain   Final    FEW WBC PRESENT, PREDOMINANTLY PMN ABUNDANT GRAM NEGATIVE RODS FEW GRAM POSITIVE COCCI    Culture (A)  Final    MULTIPLE ORGANISMS PRESENT, NONE PREDOMINANT MIXED ANAEROBIC FLORA PRESENT.  CALL LAB IF FURTHER IID REQUIRED.    Report Status PENDING  Incomplete    Coagulation Studies: No results for input(s): LABPROT, INR in the last 72 hours.  Urinalysis: No results for input(s): COLORURINE, LABSPEC, PHURINE, GLUCOSEU, HGBUR, BILIRUBINUR, KETONESUR, PROTEINUR, UROBILINOGEN, NITRITE, LEUKOCYTESUR in the last 72 hours.  Invalid input(s): APPERANCEUR    Imaging: No results found.   Medications:   . sodium chloride 500 mL (01/08/18 0753)  . sodium chloride     . budesonide (PULMICORT) nebulizer solution  0.5 mg Nebulization BID  . calcitRIOL  0.5 mcg Oral Daily  . cholecalciferol  2,000 Units Oral Daily  . clonazePAM  0.5 mg Oral TID   And  . clonazePAM  1.5 mg Oral QHS  . doxycycline  100 mg Oral BID WC  . ezetimibe  10 mg Oral Daily  . famotidine  20 mg Oral QHS  . heparin  5,000 Units Subcutaneous Q8H  . hydrALAZINE  50 mg Oral TID  . ipratropium-albuterol  3 mL Nebulization QID  . isosorbide mononitrate  30 mg Oral Daily  . levothyroxine  125 mcg Oral Daily  . methylPREDNISolone (SOLU-MEDROL) injection  40 mg Intravenous Q12H  . omega-3 acid ethyl esters  2 g Oral BID  . sodium chloride flush  3 mL Intravenous Q12H  . vitamin C  1,000 mg Oral Daily   acetaminophen, diclofenac sodium, morphine injection, ondansetron **OR** ondansetron (ZOFRAN) IV, traMADol  Assessment/ Plan:  77 y.o. female with  medical problems of coronary artery disease, MI in 2004, with subsequent cardiac catheterizations, peripheral vascular disease, chronic kidney disease, COPD on home oxygen, GERD, hypertension, atrial fibrillation, was admitted on 01/02/2018 with worsening left leg wound that started after a fall  1.  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  Lower extremity edema  - improved    LOS: [place] 7/7/20199:47 AM  [place], [place]  Note: This note was prepared with Dragon dictation. Any transcription errors are unintentional

## 2018-01-09 LAB — BASIC METABOLIC PANEL
Anion gap: 8 (ref 5–15)
BUN: 57 mg/dL — ABNORMAL HIGH (ref 8–23)
CALCIUM: 8.8 mg/dL — AB (ref 8.9–10.3)
CO2: 26 mmol/L (ref 22–32)
CREATININE: 2.94 mg/dL — AB (ref 0.44–1.00)
Chloride: 98 mmol/L (ref 98–111)
GFR, EST AFRICAN AMERICAN: 17 mL/min — AB (ref >60)
GFR, EST NON AFRICAN AMERICAN: 14 mL/min — AB (ref >60)
Glucose, Bld: 144 mg/dL — ABNORMAL HIGH (ref 70–99)
Potassium: 5.5 mmol/L — ABNORMAL HIGH (ref 3.5–5.1)
SODIUM: 132 mmol/L — AB (ref 135–145)

## 2018-01-09 MED ORDER — SODIUM CHLORIDE 0.9 % IV SOLN
INTRAVENOUS | Status: DC
  Administered 2018-01-09: 16:00:00 via INTRAVENOUS

## 2018-01-09 MED ORDER — ALUM & MAG HYDROXIDE-SIMETH 200-200-20 MG/5ML PO SUSP: 30.0000 mL | Freq: Four times a day (QID) | ORAL | Status: DC | PRN

## 2018-01-09 MED ORDER — SODIUM POLYSTYRENE SULFONATE 15 GM/60ML PO SUSP
30.0000 g | Freq: Once | ORAL | Status: AC
  Administered 2018-01-09: 30 g via ORAL
  Filled 2018-01-09: qty 120

## 2018-01-09 MED ORDER — FAMOTIDINE 20 MG PO TABS
20.0000 mg | ORAL_TABLET | Freq: Two times a day (BID) | ORAL | Status: DC
Start: 2018-01-09 — End: 2018-01-10
  Administered 2018-01-09 – 2018-01-10 (×3): 20 mg via ORAL
  Filled 2018-01-09 (×2): qty 1

## 2018-01-09 MED ORDER — SODIUM ZIRCONIUM CYCLOSILICATE 5 G PO PACK
10.0000 g | PACK | Freq: Every day | ORAL | Status: DC
  Administered 2018-01-09: 10 g via ORAL
  Filled 2018-01-09 (×3): qty 2

## 2018-01-09 NOTE — Progress Notes (Signed)
Patient ID: [name], female   DOB: 11-02-1940, 77 y.o.   MRN: [id number]  [facility] PROGRESS NOTE  [name]:[id number] DOB: 06-22-1941 DOA: 01/02/2018 PCP: Lemmie Evens, MD  HPI/Subjective: Patient had some nausea vomiting this morning.  She states that she is ready to go home.  Still wheezing.  Still with some cough and shortness of breath.  Objective: Vitals:   01/09/18 0738 01/09/18 0802  BP:  (!) 124/43  Pulse:  (!) 55  Resp:    Temp:  98.3 F (36.8 C)  SpO2: 94% 96%    Filed Weights   01/02/18 1740 01/03/18 0359 01/04/18 0427  Weight: 98.1 kg (216 lb 4.3 oz) 98.8 kg (217 lb 12.8 oz) 100.3 kg (221 lb 1.6 oz)    ROS: Review of Systems  Constitutional: Negative for chills and fever.  Eyes: Negative for blurred vision.  Respiratory: Positive for cough, shortness of breath and wheezing.   Cardiovascular: Negative for chest pain.  Gastrointestinal: Positive for nausea and vomiting. Negative for abdominal pain, constipation and diarrhea.  Genitourinary: Negative for dysuria.  Musculoskeletal: Positive for joint pain.  Neurological: Negative for dizziness and headaches.   Exam: Physical Exam  Constitutional: She is oriented to person, place, and time.  HENT:  Nose: No mucosal edema.  Mouth/Throat: No oropharyngeal exudate or posterior oropharyngeal edema.  Eyes: Pupils are equal, round, and reactive to light. Conjunctivae, EOM and lids are normal.  Neck: No JVD present. Carotid bruit is not present. No edema present. No thyroid mass and no thyromegaly present.  Cardiovascular: S1 normal and S2 normal. Exam reveals no gallop.  No murmur heard. Respiratory: No respiratory distress. She has decreased breath sounds in the right lower field and the left lower field. She has wheezes in the right middle field, the right lower field, the left middle field and the left lower field. She has no rhonchi. She has no rales.  GI: Soft. Bowel sounds are normal.  There is no tenderness.  Musculoskeletal:       Right ankle: She exhibits swelling.       Left ankle: She exhibits swelling.  Lymphadenopathy:    She has no cervical adenopathy.  Neurological: She is alert and oriented to person, place, and time. No cranial nerve deficit.  Skin: Skin is warm. Nails show no clubbing.  Large ulcer left leg which is pretty deep  Psychiatric: She has a normal mood and affect.      Data Reviewed: Basic Metabolic Panel: Recent Labs  Lab 01/05/18 0339 01/06/18 0553 01/07/18 0419 01/08/18 0439 01/09/18 0446  NA 137 137 134* 132* 132*  K 4.6 4.8 5.2* 5.3* 5.5*  CL 103 101 99 98 98  CO2 29 28 29 28 26   GLUCOSE 93 164* 132* 124* 144*  BUN 29* 29* 36* 49* 57*  CREATININE 1.33* 1.45* 2.18* 2.50* 2.94*  CALCIUM 9.2 9.2 8.9 9.0 8.8*   CBC: Recent Labs  Lab 01/03/18 0555 01/04/18 0417  WBC 4.1 5.7  HGB 7.3* 8.4*  HCT 22.3* 25.0*  MCV 104.8* 102.5*  PLT 265 264     Recent Results (from the past 240 hour(s))  Aerobic/Anaerobic Culture (surgical/deep wound)     Status: Abnormal   Collection Time: 01/02/18  1:47 PM  Result Value Ref Range Status   Specimen Description   Final    LEG Performed at [facility], [address] [place], [place] [address]    Special Requests   Final  NONE Performed at [facility], [place] [address], [place], [place] [address]    Gram Stain   Final    FEW WBC PRESENT, PREDOMINANTLY PMN ABUNDANT GRAM NEGATIVE RODS FEW GRAM POSITIVE COCCI    Culture (A)  Final    MULTIPLE ORGANISMS PRESENT, NONE PREDOMINANT MIXED ANAEROBIC FLORA PRESENT.  CALL LAB IF FURTHER IID REQUIRED.    Report Status 01/08/2018 FINAL  Final     Scheduled Meds: . budesonide (PULMICORT) nebulizer solution  0.5 mg Nebulization BID  . calcitRIOL  0.5 mcg Oral Daily  . cholecalciferol  2,000 Units Oral Daily  . clonazePAM  0.5 mg Oral TID   And  . clonazePAM  1.5 mg Oral QHS  . doxycycline  100 mg Oral BID WC  .  ezetimibe  10 mg Oral Daily  . famotidine  20 mg Oral BID  . heparin  5,000 Units Subcutaneous Q8H  . hydrALAZINE  50 mg Oral TID  . ipratropium-albuterol  3 mL Nebulization QID  . isosorbide mononitrate  30 mg Oral Daily  . levothyroxine  125 mcg Oral Daily  . methylPREDNISolone (SOLU-MEDROL) injection  40 mg Intravenous Q12H  . omega-3 acid ethyl esters  2 g Oral BID  . sodium chloride flush  3 mL Intravenous Q12H  . sodium zirconium cyclosilicate  10 g Oral Daily  . vitamin C  1,000 mg Oral Daily   Continuous Infusions: . sodium chloride      Assessment/Plan:  1.  Large necrotic left leg wound with cellulitis after a fall.   Patient had debridement in the operating room on 01/06/2018  and wound VAC placed.  Antibiotics switched to doxycycline. 2.  Acute kidney injury on chronic kidney disease stage III.  Creatinine has worsened to 2.9.   Placed on IV fluid hydration. 3.  Nausea and vomiting.  PRN Zofran.  Pepcid twice daily.  Doxycycline could potentially cause some upset stomach but she has so many other drug allergies we are limited on antibiotics. 4.  Chronic diastolic congestive heart failure.   Continue to hold Lasix today with increased kidney function. 5.  COPD exacerbation.  Continue Solu-Medrol and nebulizer treatments.  Patient still with wheezing.  Patient states she always wheezes. 6.  Atrial fibrillation chronic.  Pradaxa on hold since last admission sent secondary to anemia. 7.  Anemia requiring transfusion on this hospital stay.  Continue to hold blood thinner. 8.  Hypertension on hydralazine and Imdur 9.  Hypothyroidism unspecified on Synthroid 10.  Morbid obesity with a BMI of 43.1. 11.  Weakness.  Continued physical therapy evaluation in the hospital needed because the patient does not want to go to rehab and she wants to go home with home health.  Dr. Vickki Muff signed the form for wound VAC at home.  Code Status:     Code Status Orders  (From admission, onward)         Start     Ordered   01/02/18 1735  Full code  Continuous     01/02/18 1735    Code Status History    Date Active Date Inactive Code Status Order ID Comments User Context   12/19/2017 0332 12/20/2017 1958 Full Code [id number]  Harrie Foreman, MD Inpatient    Advance Directive Documentation     Most Recent Value  Type of Advance Directive  Healthcare Power of Attorney  Pre-existing out of facility DNR order (yellow form or pink MOST form)  -  "MOST" Form in Place?  -  Disposition Plan:  patient would like to go home with home health.  Consultants:  Podiatry  Vascular surgery  Nephrology  Cardiology  Antibiotics: -Doxycycline   Time spent: 28 minutes  [place]

## 2018-01-09 NOTE — Plan of Care (Addendum)
Patient has audible wheezes and is short of breath at times. Patient continues to experience nausea, offer saltines and ginger ale as well as zofran. Patient is refusing to discuss other treatment options for renal function such at dialysis. Patient does not believe her condition is not as the doctors describe. Continue to discuss disease progression and symptoms.

## 2018-01-09 NOTE — Care Management (Signed)
Physical therapy not able to treat patient today due to elevated potassium.  She has not made the decision to discharge home with the wound vac and pay the 200 dollars a month copay.  Says she is thinking about it. [facility] has the referral

## 2018-01-09 NOTE — Care Management Important Message (Signed)
Copy of signed IM left with patient in room.  

## 2018-01-09 NOTE — Progress Notes (Signed)
[facility], [place] 01/09/18  Subjective:  Renal function appears to be worse.  Creatinine up to 2.9. Having some wheezing.     Objective:  Vital signs in last 24 hours:  Temp:  [97.9 F (36.6 C)-98.6 F (37 C)] 98.3 F (36.8 C) (07/08 0802) Pulse Rate:  [55-76] 55 (07/08 0802) BP: (124-161)/(43-68) 124/43 (07/08 0802) SpO2:  [94 %-98 %] 96 % (07/08 0802)  Weight change:  Filed Weights   01/02/18 1740 01/03/18 0359 01/04/18 0427  Weight: 98.1 kg (216 lb 4.3 oz) 98.8 kg (217 lb 12.8 oz) 100.3 kg (221 lb 1.6 oz)    Intake/Output:    Intake/Output Summary (Last 24 hours) at 01/09/2018 1102 Last data filed at 01/09/2018 0950 Gross per 24 hour  Intake 600 ml  Output 600 ml  Net 0 ml    Physical Exam: General:  No acute distress, laying in the bed  HEENT  anicteric, moist oral mucous membranes  Neck:  Supple,  Lungs:  Bilateral expiratory wheezing, O2 by [place], mild crackles at bases  Heart::  Soft heart sounds, no rub  Abdomen:  Soft, nontender  Extremities:   trace edema, left leg wound vac  Neurologic:   Alert, oriented, able to answer questions  Skin:   Left leg bandage     Basic Metabolic Panel:  Recent Labs  Lab 01/05/18 0339 01/06/18 0553 01/07/18 0419 01/08/18 0439 01/09/18 0446  NA 137 137 134* 132* 132*  K 4.6 4.8 5.2* 5.3* 5.5*  CL 103 101 99 98 98  CO2 29 28 29 28 26   GLUCOSE 93 164* 132* 124* 144*  BUN 29* 29* 36* 49* 57*  CREATININE 1.33* 1.45* 2.18* 2.50* 2.94*  CALCIUM 9.2 9.2 8.9 9.0 8.8*     CBC: Recent Labs  Lab 01/02/18 1338 01/03/18 0555 01/04/18 0417  WBC 4.9 4.1 5.7  NEUTROABS 3.5  --   --   HGB 8.5* 7.3* 8.4*  HCT 25.6* 22.3* 25.0*  MCV 106.0* 104.8* 102.5*  PLT 305 265 264     No results found for: HEPBSAG, HEPBSAB, HEPBIGM    Microbiology:  Recent Results (from the past 240 hour(s))  Aerobic/Anaerobic Culture (surgical/deep wound)     Status: Abnormal   Collection Time: 01/02/18  1:47  PM  Result Value Ref Range Status   Specimen Description   Final    LEG Performed at [facility], [address], [place], [place] [address]    Special Requests   Final    NONE Performed at [facility], [place] [address], [place], [place] [address]    Gram Stain   Final    FEW WBC PRESENT, PREDOMINANTLY PMN ABUNDANT GRAM NEGATIVE RODS FEW GRAM POSITIVE COCCI    Culture (A)  Final    MULTIPLE ORGANISMS PRESENT, NONE PREDOMINANT MIXED ANAEROBIC FLORA PRESENT.  CALL LAB IF FURTHER IID REQUIRED.    Report Status 01/08/2018 FINAL  Final    Coagulation Studies: No results for input(s): LABPROT, INR in the last 72 hours.  Urinalysis: No results for input(s): COLORURINE, LABSPEC, PHURINE, GLUCOSEU, HGBUR, BILIRUBINUR, KETONESUR, PROTEINUR, UROBILINOGEN, NITRITE, LEUKOCYTESUR in the last 72 hours.  Invalid input(s): APPERANCEUR    Imaging: No results found.   Medications:   . sodium chloride     . budesonide (PULMICORT) nebulizer solution  0.5 mg Nebulization BID  . calcitRIOL  0.5 mcg Oral Daily  . cholecalciferol  2,000 Units Oral Daily  . clonazePAM  0.5 mg Oral  TID   And  . clonazePAM  1.5 mg Oral QHS  . doxycycline  100 mg Oral BID WC  . ezetimibe  10 mg Oral Daily  . famotidine  20 mg Oral BID  . heparin  5,000 Units Subcutaneous Q8H  . hydrALAZINE  50 mg Oral TID  . ipratropium-albuterol  3 mL Nebulization QID  . isosorbide mononitrate  30 mg Oral Daily  . levothyroxine  125 mcg Oral Daily  . methylPREDNISolone (SOLU-MEDROL) injection  40 mg Intravenous Q12H  . omega-3 acid ethyl esters  2 g Oral BID  . sodium chloride flush  3 mL Intravenous Q12H  . vitamin C  1,000 mg Oral Daily   acetaminophen, diclofenac sodium, ipratropium-albuterol, morphine injection, ondansetron **OR** ondansetron (ZOFRAN) IV, traMADol  Assessment/ Plan:  77 y.o. female with medical problems of coronary artery disease, MI in 2004, with subsequent cardiac  catheterizations, peripheral vascular disease, chronic kidney disease, COPD on home oxygen, GERD, hypertension, atrial fibrillation, was admitted on 01/02/2018 with worsening left leg wound that started after a fall  1.  Acute renal failure chronic kidney disease stage 4 Baseline creatinine 1.33//GFR 37 from December 20, 2017 Cr has worsened after surgery likely ATN - Renal function is worsened over the past several days.  Creatinine currently 2.94.  Given rising renal function we recommend initiation of 0.9 normal saline at 40 cc/h.  2.  Hyperkalemia.  Serum potassium up to 5.5.  We will go ahead and start the patient on sodium zirconium 10 g p.o. daily for now.  3.  Lower extremity edema.  Hold off on diuretics for now.      LOS: 7 Harlis Champoux 7/8/201911:02 AM  [place], [place]  Note: This note was prepared with Dragon dictation. Any transcription errors are unintentional

## 2018-01-09 NOTE — Progress Notes (Signed)
PT Cancellation Note  Patient Details Name: [name] MRN: [id number] DOB: 10/04/1940   Cancelled Treatment:    Reason Eval/Treat Not Completed: Medical issues which prohibited therapy(Chart reviewed for attempted treatment session.  Patient noted with hyperkalemia (5.5) per current labs; starting daily PO medication to address per renal.  Will hold this date with re-attempt next date as medically appropriate.)  Destanie Tibbetts H. Owens Shark, PT, DPT, NCS 01/09/18, 2:32 PM [phone]

## 2018-01-10 LAB — BASIC METABOLIC PANEL
Anion gap: 7 (ref 5–15)
BUN: 69 mg/dL — ABNORMAL HIGH (ref 8–23)
CHLORIDE: 99 mmol/L (ref 98–111)
CO2: 28 mmol/L (ref 22–32)
Calcium: 8.5 mg/dL — ABNORMAL LOW (ref 8.9–10.3)
Creatinine, Ser: 3.04 mg/dL — ABNORMAL HIGH (ref 0.44–1.00)
GFR calc non Af Amer: 14 mL/min — ABNORMAL LOW (ref >60)
GFR, EST AFRICAN AMERICAN: 16 mL/min — AB (ref >60)
GLUCOSE: 127 mg/dL — AB (ref 70–99)
POTASSIUM: 5.2 mmol/L — AB (ref 3.5–5.1)
Sodium: 134 mmol/L — ABNORMAL LOW (ref 135–145)

## 2018-01-10 LAB — CBC
HEMATOCRIT: 26.8 % — AB (ref 35.0–47.0)
HEMOGLOBIN: 8.8 g/dL — AB (ref 12.0–16.0)
MCH: 33.4 pg (ref 26.0–34.0)
MCHC: 32.9 g/dL (ref 32.0–36.0)
MCV: 101.6 fL — AB (ref 80.0–100.0)
Platelets: 216 10*3/uL (ref 150–440)
RBC: 2.63 MIL/uL — AB (ref 3.80–5.20)
RDW: 16.5 % — ABNORMAL HIGH (ref 11.5–14.5)
WBC: 3.7 10*3/uL (ref 3.6–11.0)

## 2018-01-10 MED ORDER — DOXYCYCLINE HYCLATE 100 MG PO TABS
100.0000 mg | ORAL_TABLET | Freq: Two times a day (BID) | ORAL | 0 refills | Status: DC
Start: 2018-01-10 — End: 2018-05-22

## 2018-01-10 MED ORDER — VITAMIN C 500 MG PO TABS
250.0000 mg | ORAL_TABLET | Freq: Two times a day (BID) | ORAL | Status: DC
  Filled 2018-01-10: qty 0.5

## 2018-01-10 MED ORDER — DOXYCYCLINE HYCLATE 100 MG PO TABS
100.0000 mg | ORAL_TABLET | Freq: Two times a day (BID) | ORAL | Status: DC
Start: 2018-01-10 — End: 2018-01-10

## 2018-01-10 MED ORDER — OCUVITE-LUTEIN PO CAPS
1.0000 | ORAL_CAPSULE | Freq: Every day | ORAL | Status: DC
  Filled 2018-01-10: qty 1

## 2018-01-10 MED ORDER — METHYLPREDNISOLONE SODIUM SUCC 40 MG IJ SOLR: 40.0000 mg | INTRAMUSCULAR | Status: DC

## 2018-01-10 MED ORDER — SODIUM ZIRCONIUM CYCLOSILICATE 5 G PO PACK
10.0000 g | PACK | Freq: Every day | ORAL | Status: DC
  Filled 2018-01-10: qty 2

## 2018-01-10 MED ORDER — SODIUM CHLORIDE 0.9 % IV SOLN
100.0000 mg | Freq: Two times a day (BID) | INTRAVENOUS | Status: DC
  Filled 2018-01-10 (×2): qty 100

## 2018-01-10 MED ORDER — PREMIER PROTEIN SHAKE: 11.0000 [oz_av] | Freq: Two times a day (BID) | ORAL | Status: DC

## 2018-01-10 NOTE — Care Management Note (Signed)
Case Management Note  Patient Details  Name: [name] MRN: [id number] Date of Birth: 1941/03/20   Patient admitted from home with husband.  PCP Karie Kirks.  Open with [facility]. Patient has a WC, RW, and BSC in the home.  Patient currently with wound vac in place.   Patient leaving AMA today.  Even after in depth conversation with RNCM patient is adamant that she is leaving AMA today.   Attending would like to order  Home health still at discharge.  RNCm reached out to [facility] with [place].  Per [facility] due to patient leaving AMA her insurance will not cover the wound vac at all and she will be responsible for 100%.  Patient has declined to pay out of pocket, and will go home with daily wet to dry dressings.  Per Leroy Sea with [facility] due to the patient leaving AMA her PCP will not be able to sign for home health orders, and the hospital attending will have to be in agreement  To sign for home health orders for the duration of the encounter.  Myself and Leroy Sea discussed case with Dr. Leslye Peer and he is agreeable to sign orders.   Bedside RN to remove vac and to instruct patient and family on [facility] dressings prior to discharge Subjective/Objective:                    Action/Plan:   Expected Discharge Date:                  Expected Discharge Plan:  [place]  In-House Referral:     Discharge planning Services  CM Consult  Post Acute Care Choice:  Resumption of Svcs/PTA Provider Choice offered to:     DME Arranged:    DME Agency:     HH Arranged:    [place] Agency:  [place]  Status of Service:  Completed, signed off  If discussed at [place] of Stay Meetings, dates discussed:    Additional Comments:  Beverly Sessions, RN 01/10/2018, 3:37 PM

## 2018-01-10 NOTE — Progress Notes (Signed)
Patient ID: [name], female   DOB: 11/21/1940, 77 y.o.   MRN: [id number]  If patient leaves today, it will be against medical advise  Dr Leslye Peer

## 2018-01-10 NOTE — Progress Notes (Signed)
PT Cancellation Note  Patient Details Name: [name] MRN: [id number] DOB: 24-Oct-1940   Cancelled Treatment:    Reason Eval/Treat Not Completed: (Treatment session attempted.  Per discussion with primary RN, patient currently in process of signing out of hospital AMA.  Will continue efforts next date if patient remains in house.)  Veera Stapleton H. Owens Shark, PT, DPT, NCS 01/10/18, 3:36 PM [phone]

## 2018-01-10 NOTE — Progress Notes (Signed)
[facility], [place] 01/10/18  Subjective:  Renal function continues to worsen. bUN up to 69 with a creatinine of 3.04 and EGFR 14. Patient made aware of these findings.   Objective:  Vital signs in last 24 hours:  Temp:  [97.4 F (36.3 C)-98.6 F (37 C)] 97.4 F (36.3 C) (07/09 0719) Pulse Rate:  [60-64] 60 (07/09 0719) BP: (142-162)/(52-57) 153/54 (07/09 0719) SpO2:  [93 %-96 %] 93 % (07/09 0807) Weight:  [103.2 kg (227 lb 8 oz)] 103.2 kg (227 lb 8 oz) (07/09 0538)  Weight change:  Filed Weights   01/03/18 0359 01/04/18 0427 01/10/18 0538  Weight: 98.8 kg (217 lb 12.8 oz) 100.3 kg (221 lb 1.6 oz) 103.2 kg (227 lb 8 oz)    Intake/Output:    Intake/Output Summary (Last 24 hours) at 01/10/2018 1158 Last data filed at 01/10/2018 0700 Gross per 24 hour  Intake 480 ml  Output 350 ml  Net 130 ml    Physical Exam: General:  No acute distress, sitting up in chair  HEENT  anicteric, moist oral mucous membranes  Neck:  Supple  Lungs:  Bilateral expiratory wheezing, O2 by [place]  Heart::  S1S2 no rubs  Abdomen:  Soft, nontender  Extremities:   trace edema  Neurologic:   Alert, oriented, able to answer questions  Skin:   Left leg bandage     Basic Metabolic Panel:  Recent Labs  Lab 01/06/18 0553 01/07/18 0419 01/08/18 0439 01/09/18 0446 01/10/18 0519  NA 137 134* 132* 132* 134*  K 4.8 5.2* 5.3* 5.5* 5.2*  CL 101 99 98 98 99  CO2 _0 GLUCOSE 164* 132* 124* 144* 127*  BUN 29* 36* 49* 57* 69*  CREATININE 1.45* 2.18* 2.50* 2.94* 3.04*  CALCIUM 9.2 8.9 9.0 8.8* 8.5*     CBC: Recent Labs  Lab 01/04/18 0417 01/10/18 0519  WBC 5.7 3.7  HGB 8.4* 8.8*  HCT 25.0* 26.8*  MCV 102.5* 101.6*  PLT 264 216     No results found for: HEPBSAG, HEPBSAB, HEPBIGM    Microbiology:  Recent Results (from the past 240 hour(s))  Aerobic/Anaerobic Culture (surgical/deep wound)     Status: Abnormal   Collection Time: 01/02/18  1:47 PM   Result Value Ref Range Status   Specimen Description   Final    LEG Performed at [facility], [address], [place], [place] [address]    Special Requests   Final    NONE Performed at [facility], [address], [place], [place] [address]    Gram Stain   Final    FEW WBC PRESENT, PREDOMINANTLY PMN ABUNDANT GRAM NEGATIVE RODS FEW GRAM POSITIVE COCCI    Culture (A)  Final    MULTIPLE ORGANISMS PRESENT, NONE PREDOMINANT MIXED ANAEROBIC FLORA PRESENT.  CALL LAB IF FURTHER IID REQUIRED.    Report Status 01/08/2018 FINAL  Final    Coagulation Studies: No results for input(s): LABPROT, INR in the last 72 hours.  Urinalysis: No results for input(s): COLORURINE, LABSPEC, PHURINE, GLUCOSEU, HGBUR, BILIRUBINUR, KETONESUR, PROTEINUR, UROBILINOGEN, NITRITE, LEUKOCYTESUR in the last 72 hours.  Invalid input(s): APPERANCEUR    Imaging: No results found.   Medications:   . sodium chloride 40 mL/hr at 01/09/18 1612   . budesonide (PULMICORT) nebulizer solution  0.5 mg Nebulization BID  . calcitRIOL  0.5 mcg Oral Daily  . cholecalciferol  2,000 Units Oral Daily  . clonazePAM  0.5 mg Oral  TID   And  . clonazePAM  1.5 mg Oral QHS  . doxycycline  100 mg Oral Q12H  . ezetimibe  10 mg Oral Daily  . famotidine  20 mg Oral BID  . heparin  5,000 Units Subcutaneous Q8H  . hydrALAZINE  50 mg Oral TID  . ipratropium-albuterol  3 mL Nebulization QID  . isosorbide mononitrate  30 mg Oral Daily  . levothyroxine  125 mcg Oral Daily  . omega-3 acid ethyl esters  2 g Oral BID  . sodium chloride flush  3 mL Intravenous Q12H  . sodium zirconium cyclosilicate  10 g Oral QPC lunch  . vitamin C  1,000 mg Oral Daily   acetaminophen, alum & mag hydroxide-simeth, diclofenac sodium, ipratropium-albuterol, morphine injection, ondansetron **OR** ondansetron (ZOFRAN) IV, traMADol  Assessment/ Plan:  77 y.o. female with medical problems of coronary artery disease, MI in 2004, with  subsequent cardiac catheterizations, peripheral vascular disease, chronic kidney disease, COPD on home oxygen, GERD, hypertension, atrial fibrillation, was admitted on 01/02/2018 with worsening left leg wound that started after a fall  1.  Acute renal failure chronic kidney disease stage 4 Baseline creatinine 1.33//GFR 37 from December 20, 2017 Cr has worsened after surgery likely ATN - renal function continues to deteriorate.  BUN up to 69 with a creatinine of 3.04.  Azotemia likely worsen with steroids.  This was discussed with hospitalist.  Steroids to be stopped.  Continue IV fluid hydration with 0.9 normal saline at 40 cc per hour.  2.  Hyperkalemia.  Potassium down to 5.2. Maintain the patient on sodium zirconium 10 g by mouth daily.  Followup serum potassium tomorrow.  3.  Lower extremity edema.  Continue to hold diuretics for now.      LOS: 8 Cristan Hout 7/9/201911:58 AM  [place], [place]  Note: This note was prepared with Dragon dictation. Any transcription errors are unintentional

## 2018-01-10 NOTE — Discharge Summary (Signed)
[facility] NAME: [name]    MR#:  [id number]  DATE OF BIRTH:  06-03-1941  DATE OF ADMISSION:  01/02/2018 ADMITTING PHYSICIAN: Gladstone Lighter, MD  DATE OF signing out AGAINST MEDICAL ADVICE: 01/10/2018 1.  PRIMARY CARE PHYSICIAN: Lemmie Evens, MD    ADMISSION DIAGNOSIS:  Bradycardia [R00.1] ARF (acute renal failure) ([place]) [N17.9] Wound infection [T14.8XXA, L08.9] COPD exacerbation ([place]) [J44.1] Chronic kidney disease, unspecified CKD stage [N18.9]  DISCHARGE DIAGNOSIS:  Active Problems:   ARF (acute renal failure) ([place])   SECONDARY DIAGNOSIS:   Past Medical History:  Diagnosis Date  . CAD (coronary artery disease)    a. 10/2002 PCI: LAD 83m (2.75x23 Cypher DES); b. 09/2004 Cath: LM nl, LAD 40p, patent stent, D1 30p, LCX 20p, diff dzs throughout, OM1 50p, OM2 60p, OM3 40p, RCA 40p-->Med Rx; c. 10/2012 MV: No ischemia/infarct.  . Carotid artery disease ([place])   . Cervical disc disease   . CKD (chronic kidney disease), stage III ([place])   . COPD (chronic obstructive pulmonary disease) ([place])   . Diastolic dysfunction    a. 03/2017 Echo: Ef 60-65%, Gr2 DD, mild MR, mildly dil LA/RA, mildly to mod dil RV w/ nl fxn, PASP 43mmHg.  Marland Kitchen Dysphagia   . GERD (gastroesophageal reflux disease)   . History of kidney stones   . Hypertension    2002  . Hypertriglyceridemia   . Hypothyroidism   . PAD (peripheral artery disease) ([place])    a. 10/2017 ABI's R = 0.78, L = 0.88. Duplex showed borderline R CFA and SFA dzs->med rx.  Marland Kitchen PAF (paroxysmal atrial fibrillation) ([place])    a. Amio/Pradaxa (CHA2DS2VASc = 6).  Marland Kitchen PONV (postoperative nausea and vomiting)   . Right renal artery stenosis ([place])    a. 09/2017 Renal Artery Duplex: no significant LRA stenosis. RRA <60%.    HOSPITAL COURSE:   1.  Acute kidney injury that keeps on worsening.  Creatinine is 3.04 today.  Nephrology recommends IV and IV fluids to get creatinine better.  The patient  states that she is leaving the hospital today.  I was unable to convince her to stay.  She will have to sign out [place].  Lasix held for the last 3 days.  Creatinine was as good as 1.33 while here in the hospital. 2.  Large necrotic wound left leg with cellulitis after a fall.  Patient had debridement in the operating room on 01/06/2018.  Wound VAC placed.  Dr. Vickki Muff podiatry recommended wound VAC to be changed every 3 days as outpatient.  With the patient signing out AMA the home health is looking into whether the wound VAC will be able to be paid for by the insurance company or the patient will be able to do it.  I spoke with the home health company and it can be switched to wet-to-dry dressing if patient unable to pay for the wound VAC.  Few more days of doxycycline prescribed. 3.  Nausea vomiting.  Patient states that this has improved. 4.  COPD exacerbation.  Patient received IV steroids while here in the hospital and nebulizer treatments.  Patient is still wheezing.  Patient states that she always wheezes. 5.  Chronic respiratory failure on oxygen. 6.  Atrial fibrillation which is chronic.  Pradaxa on hold since last admission secondary to anemia.  Amiodarone and metoprolol held with bradycardia. 7.  Hypertension on hydralazine and Imdur. 8.  Hypothyroidism unspecified on Synthroid 9.  Morbid obesity with a BMI of 43.1. 10.  Weakness.  Patient refused rehab.  She wants to go home with home health.   DISCHARGE CONDITIONS:   Patient advised not to sign out [place].  Condition with worsening kidney function could lead to death.  CONSULTS OBTAINED:  Treatment Team:  Samara Deist, DPM Murlean Iba, MD End, Harrell Gave, MD  DRUG ALLERGIES:   Allergies  Allergen Reactions  . Ciprofloxacin Hcl Anaphylaxis  . Penicillins Anaphylaxis    Near death experience,  Has patient had a PCN reaction causing immediate rash, facial/tongue/throat swelling, SOB or  lightheadedness with hypotension: Yes Has patient had a PCN reaction causing severe rash involving mucus membranes or skin necrosis: No Has patient had a PCN reaction that required hospitalization: Yes Has patient had a PCN reaction occurring within the last 10 years: No If all of the above answers are "NO", then may proceed with Cephalosporin use.  . Amlodipine     Leg swelling  . Ceclor [Cefaclor] Nausea And Vomiting  . Codeine Nausea Only and Other (See Comments)    Cramps, nausea  . Lipitor [Atorvastatin] Other (See Comments)    Myalgias   . Sulfa Antibiotics Rash         DISCHARGE MEDICATIONS:   Allergies as of 01/10/2018      Reactions   Ciprofloxacin Hcl Anaphylaxis   Penicillins Anaphylaxis   Near death experience,  Has patient had a PCN reaction causing immediate rash, facial/tongue/throat swelling, SOB or lightheadedness with hypotension: Yes Has patient had a PCN reaction causing severe rash involving mucus membranes or skin necrosis: No Has patient had a PCN reaction that required hospitalization: Yes Has patient had a PCN reaction occurring within the last 10 years: No If all of the above answers are "NO", then may proceed with Cephalosporin use.   Amlodipine    Leg swelling   Ceclor [cefaclor] Nausea And Vomiting   Codeine Nausea Only, Other (See Comments)   Cramps, nausea   Lipitor [atorvastatin] Other (See Comments)   Myalgias   Sulfa Antibiotics Rash          Medication List    STOP taking these medications   amiodarone 200 MG tablet Commonly known as:  PACERONE   dabigatran 150 MG Caps capsule Commonly known as:  PRADAXA   furosemide 40 MG tablet Commonly known as:  LASIX   metoprolol tartrate 25 MG tablet Commonly known as:  LOPRESSOR     TAKE these medications   acetaminophen 500 MG tablet Commonly known as:  TYLENOL Take 500-1,000 mg by mouth every 6 (six) hours as needed for mild pain or moderate pain.   albuterol (2.5 MG/3ML) 0.083%  nebulizer solution Commonly known as:  PROVENTIL Take 2.5 mg by nebulization every 6 (six) hours as needed for shortness of breath.   albuterol 108 (90 Base) MCG/ACT inhaler Commonly known as:  PROVENTIL HFA;VENTOLIN HFA Inhale 2 puffs into the lungs every 4 (four) hours as needed for wheezing or shortness of breath.   calcitRIOL 0.5 MCG capsule Commonly known as:  ROCALTROL Take 0.5 mcg by mouth daily.   cetirizine 10 MG tablet Commonly known as:  ZYRTEC Take 10 mg by mouth daily as needed for allergies.   cholecalciferol 1000 units tablet Commonly known as:  VITAMIN D Take 2,000 Units by mouth daily.   clonazePAM 1 MG tablet Commonly known as:  KLONOPIN Take 0.5-1.5 mg by mouth 4 (four) times daily. Takes 0.5mg  three times a day and  takes 1.5mg  at bedtime   doxycycline 100 MG tablet Commonly known as:  VIBRA-TABS Take 1 tablet (100 mg total) by mouth every 12 (twelve) hours.   ezetimibe 10 MG tablet Commonly known as:  ZETIA Take 10 mg by mouth daily.   Fluticasone-Salmeterol 500-50 MCG/DOSE Aepb Commonly known as:  ADVAIR Inhale 1 puff into the lungs 2 (two) times daily.   hydrALAZINE 50 MG tablet Commonly known as:  APRESOLINE Take 1 tablet (50 mg total) by mouth 3 (three) times daily.   isosorbide mononitrate 60 MG 24 hr tablet Commonly known as:  IMDUR TAKE ONE TABLET (60MG  TOTAL) BY MOUTH TWO TIMES DAILY   levothyroxine 125 MCG tablet Commonly known as:  SYNTHROID, LEVOTHROID Take 125 mcg by mouth daily.   nitroGLYCERIN 0.4 MG/SPRAY spray Commonly known as:  NITROLINGUAL Place 1 spray under the tongue every 5 (five) minutes x 3 doses as needed for chest pain.   omega-3 acid ethyl esters 1 g capsule Commonly known as:  LOVAZA Take 2 g by mouth 2 (two) times daily.   ranitidine 150 MG tablet Commonly known as:  ZANTAC Take 150 mg by mouth daily as needed for heartburn.   vitamin B-12 1000 MCG tablet Commonly known as:  CYANOCOBALAMIN Take 1,000 mcg  by mouth 2 (two) times daily.   vitamin C 1000 MG tablet Take 1,000 mg by mouth daily.        DISCHARGE INSTRUCTIONS:   Follow-up with PMD as soon as possible Follow-up with your kidney doctor as soon as possible Follow-up with Dr. Vickki Muff 1 to 2 weeks.  If you experience worsening of your admission symptoms, develop shortness of breath, life threatening emergency, suicidal or homicidal thoughts you must seek medical attention immediately by calling 911 or calling your MD immediately  if symptoms less severe.  You Must read complete instructions/literature along with all the possible adverse reactions/side effects for all the Medicines you take and that have been prescribed to you. Take any new Medicines after you have completely understood and accept all the possible adverse reactions/side effects.   Please note  You were cared for by a hospitalist during your hospital stay. If you have any questions about your discharge medications or the care you received while you were in the hospital after you are discharged, you can call the unit and asked to speak with the hospitalist on call if the hospitalist that took care of you is not available. Once you are discharged, your primary care physician will handle any further medical issues. Please note that NO REFILLS for any discharge medications will be authorized once you are discharged, as it is imperative that you return to your primary care physician (or establish a relationship with a primary care physician if you do not have one) for your aftercare needs so that they can reassess your need for medications and monitor your lab values.    Today   CHIEF COMPLAINT:   Chief Complaint  Patient presents with  . Wound Infection  . Shortness of Breath    HISTORY OF PRESENT ILLNESS:  [name]  is a 77 y.o. female  came in with a wound infection   VITAL SIGNS:  Blood pressure (!) 153/54, pulse 60, temperature (!) 97.4 F (36.3 C),  temperature source Oral, resp. rate 20, height 5\' 7"  (1.702 m), weight 103.2 kg (227 lb 8 oz), SpO2 93 %.  PHYSICAL EXAMINATION:  GENERAL:  77 y.o.-year-old patient lying in the bed with no acute distress.  EYES: Pupils equal, round, reactive to light and accommodation. No scleral icterus. Extraocular muscles intact.  HEENT: Head atraumatic, normocephalic. Oropharynx and nasopharynx clear.  NECK:  Supple, no jugular venous distention. No thyroid enlargement, no tenderness.  LUNGS:  decreased breath sounds bilaterally, positive wheezing  throughout lung field.  rales,rhonchi or crepitation. No use of accessory muscles of respiration.  CARDIOVASCULAR: S1, S2 normal. No murmurs, rubs, or gallops.  ABDOMEN: Soft, non-tender, non-distended. Bowel sounds present. No organomegaly or mass.  EXTREMITIES:  2+ pedal edema, no cyanosis, or clubbing.  NEUROLOGIC: Cranial nerves II through XII are intact. Muscle strength 5/5 in all extremities. Sensation intact. Gait not checked.  PSYCHIATRIC: The patient is alert and oriented x 3.  SKIN: No obvious rash, lesion, or ulcer.   DATA REVIEW:   CBC Recent Labs  Lab 01/10/18 0519  WBC 3.7  HGB 8.8*  HCT 26.8*  PLT 216    Chemistries  Recent Labs  Lab 01/10/18 0519  NA 134*  K 5.2*  CL 99  CO2 28  GLUCOSE 127*  BUN 69*  CREATININE 3.04*  CALCIUM 8.5*    Microbiology Results  Results for orders placed or performed during the hospital encounter of 01/02/18  Aerobic/Anaerobic Culture (surgical/deep wound)     Status: Abnormal   Collection Time: 01/02/18  1:47 PM  Result Value Ref Range Status   Specimen Description   Final    LEG Performed at [facility], [address], [place], [place] [address]    Special Requests   Final    NONE Performed at [facility], [place] [address], [place], [place] [address]    Gram Stain   Final    FEW WBC PRESENT, PREDOMINANTLY PMN ABUNDANT GRAM NEGATIVE RODS FEW GRAM POSITIVE COCCI     Culture (A)  Final    MULTIPLE ORGANISMS PRESENT, NONE PREDOMINANT MIXED ANAEROBIC FLORA PRESENT.  CALL LAB IF FURTHER IID REQUIRED.    Report Status 01/08/2018 FINAL  Final      Management plans discussed with the patient, family and they are in agreement.  CODE STATUS:     Code Status Orders  (From admission, onward)        Start     Ordered   01/02/18 1735  Full code  Continuous     01/02/18 1735    Code Status History    Date Active Date Inactive Code Status Order ID Comments User Context   12/19/2017 0332 12/20/2017 1958 Full Code [id number]  Harrie Foreman, MD Inpatient    Advance Directive Documentation     Most Recent Value  Type of Advance Directive  Healthcare Power of Attorney  Pre-existing out of facility DNR order (yellow form or pink MOST form)  -  "MOST" Form in Place?  -      TOTAL TIME TAKING CARE OF THIS PATIENT: 40 minutes.    Loletha Grayer M.D on 01/10/2018 at 3:35 PM  Between 7am to 6pm - Pager - [phone]  After 6pm go to [email or web address] - password [email or web address] [facility]  [phone]  CC: Primary care physician; Lemmie Evens, MD

## 2018-01-10 NOTE — Progress Notes (Signed)
The patient signed out AMA. Wound vac removed from the left leg. Spoke with [facility] RN) about the nature of the wound and how it should be dressed. She advised against wet to dry dressings and recommended dry gauze packing, covered with ABD pad, and wrapped in kerlex. The leg was dressed with guaze, ABD, and kerlex. Taught the patient how the dressing changes should be performed. Instructed the patient to watch the wound for bleeding and avoid blood thinning medications and if the dressing becomes saturated, to return to the emergency room immediately.  Prescription for antibiotics given to the patient. Instructed the patient to follow up with PCP within 1 week for continued care of her wound and medical conditions. Pt verbalized understanding of the risks of leaving AMA, as well as instructions given to her at the time of leaving. The patient and this RN were in the patient's room waiting on a wheelchair to be brought in for the patient. The patient stood at the bedside and was looking in the bed for one of her belongings. She lost her footing and stumbled. This RN assisted the patient to the floor on her bottom. The patient did not hit herself on anything and denied injury. Several staff members assisted the patient out of the floor and into the wheelchair. 2 staff members accompanied to patient out and into the car.

## 2018-01-11 ENCOUNTER — Telehealth: Payer: Self-pay | Admitting: Cardiovascular Disease

## 2018-01-11 NOTE — Telephone Encounter (Signed)
Patient left AMA yesterday from the hospital yesterday. Patient fell 3 times between leaving the hospital and getting home. So full of fluid that her arms are weeping fluid. Legs are huge and full of fluid. More short of breath but improving with O2 on. Not wearing oxygen last night when [place] arrived. 142/72, HR 64. Patient is very weak.  Took breathing treatments and doxycycline this morning.  Patient has appointment with PCP on Friday.   Home Health RN would like verbal order for ACE wraps to legs for severe swelling and orders for lasix. Discussed all this with Dr Rockey Situ and he reviewed patient's record and lab work. Patient appears anemic and kidneys are dry. He feels patient is third spacing from the anemia and does not need any lasix. He was is was ok for ACE wraps. He recommends patient f/u with PCP as soon as possible and possibly palliative consult would be needed.   Called Sarah back and she verbalized understanding of Dr Donivan Scull recommendations.

## 2018-01-11 NOTE — Telephone Encounter (Signed)
[name] with [facility] is with patient States the information that the patient came home with states to stop heart medication (LASIX) Patient has been short of breath and has edema swelling bad in arms and legs Would like to know what can be offered  Please call Judson Roch to discuss, she does have bad signal at patients home so leave message if no answer

## 2018-01-12 NOTE — Telephone Encounter (Signed)
Please call pt regarding her leaving the hospital yesterday. She asks what should she do next. Please call to discuss.

## 2018-01-12 NOTE — Telephone Encounter (Signed)
Advised patient to keep scheduled appointment with Dr Rockey Situ on 01/20/18 and to be sure to follow up with PCP tomorrow as scheduled. Patient expressed her frustration with her hospital experience that she did not receive a bath in 9 days of being there and that's why she left. The home health nurse is supposed to come by today to see her as well. Pt verbalized understanding to call 911 or go to the emergency room, if he develops any new or worsening symptoms.

## 2018-01-20 ENCOUNTER — Encounter: Payer: Self-pay | Admitting: Cardiovascular Disease

## 2018-01-20 ENCOUNTER — Encounter

## 2018-01-20 ENCOUNTER — Ambulatory Visit: Payer: Medicare Other | Admitting: Cardiovascular Disease

## 2018-01-20 VITALS — BP 140/50 | HR 67 | Ht 66.5 in | Wt 225.0 lb

## 2018-01-20 DIAGNOSIS — I480 Paroxysmal atrial fibrillation: Secondary | ICD-10-CM

## 2018-01-20 DIAGNOSIS — J432 Centrilobular emphysema: Secondary | ICD-10-CM

## 2018-01-20 DIAGNOSIS — E782 Mixed hyperlipidemia: Secondary | ICD-10-CM

## 2018-01-20 DIAGNOSIS — N183 Chronic kidney disease, stage 3 unspecified: Secondary | ICD-10-CM

## 2018-01-20 DIAGNOSIS — I10 Essential (primary) hypertension: Secondary | ICD-10-CM | POA: Diagnosis not present

## 2018-01-20 DIAGNOSIS — I25118 Atherosclerotic heart disease of native coronary artery with other forms of angina pectoris: Secondary | ICD-10-CM | POA: Diagnosis not present

## 2018-01-20 DIAGNOSIS — N2889 Other specified disorders of kidney and ureter: Secondary | ICD-10-CM

## 2018-01-20 DIAGNOSIS — R0602 Shortness of breath: Secondary | ICD-10-CM | POA: Diagnosis not present

## 2018-01-20 DIAGNOSIS — I739 Peripheral vascular disease, unspecified: Secondary | ICD-10-CM | POA: Diagnosis not present

## 2018-01-20 NOTE — Patient Instructions (Addendum)
Medication Instructions:   Iron Ask kidney doctor about epogen Need HBG >10, blood count <9  Compression/ace wraps,  Renal appt [place] 1:30 Monday Dr. Jimmy Footman  Ask Dr. Jimmy Footman about changing lasix to Torsemide  Labwork:  No new labs needed  Testing/Procedures:  No further testing at this time   Follow-Up: It was a pleasure seeing you in the office today. Please call us if you have new issues that need to be addressed before your next appt.  [phone]  Your physician wants you to follow-up in:  As needed  If you need a refill on your cardiac medications before your next appointment, please call your pharmacy.  For educational health videos Log in to : [email or web address] Or : [email or web address], password : [email or web address]

## 2018-01-20 NOTE — Progress Notes (Signed)
Cardiology Office Note  Date:  01/20/2018   ID:  [name], DOB December 28, 1940, MRN [id number]  PCP:  Lemmie Evens, MD   Chief Complaint  Patient presents with  . other    Follow from [facility]; weakness and shortness of breath. Pt. c/o shortness of breath and swelling all over, had chest pain last night and feels terrible.     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 Jimmy Footman in [place]  reports that she is DNR/DNI, she has told this to family, has papers in place who presents for routine followup of her coronary artery disease And leg swelling.  Several recent hospitalizations with discharge 12/20/2017 And discharge 01/10/2018 Presented initially to the hospital after a fall ( had been in the hospital at [facility] several days earlier for fall as well), skin tear left lower extremity, very weak,  Worsening anemia, acute on chronic renal failure Diuretics held and started on IV fluids with improvement of her renal function, discharged home with IV fluids Echocardiogram showing ejection fraction 65-70 percent  readmitted several weeks later Wound infection and shortness of breath, wound was foul-smelling and draining Bradycardic on arrival, rate in the 40s, pressure 450 systolic, Creatinine 2.9 BUN 54 sodium 131 hematocrit 25 Creatinine did achieve 1.33 with fluids, then with quick trend up to more than 3 from July 4 to time of discharge. Lasix held last several days of her hospitalization She left AMA, ischarged with antibiotics Treated with IV steroids for COPD exacerbation and chronic  wheezing Anticoagulation/pradaxa held in the setting of her anemia and leg wound Amiodarone and metoprolol held for bradycardia She refused rehabilitation or placement  In follow-up today reports that her legs continue toThe swollen Walking with walker, no recent falls Tried to get into see nephrology in [place]  Has not smoked in the past month  Recently seen by primary care who restarted Lasix 40 mg daily  History of chronic leg pain, chronic low back pain, cervical pain  EKG personally reviewed by myself on todays visit Shows normal sinus rhythm with rate 67 bpm poor R-wave progression to the anterior precordial leads nonspecific ST abnormality  Other past medical history reviewed Previous workup for lower extremity vascular disease   showing moderate nonobstructive disease, medical management recommended    She does not want a statin  Thyroid medication recently increased. Initial TSH 18.8, down to 12 in July 2016. Managed by primary care We did talk about side effects of amiodarone and she does not want to change the medication She does not want a statin   Cardioversion 05/25/2013 which was successful in restoring normal sinus rhythm.   converted from atrial fibrillation to atrial flutter at the time of the cardioversion.  PMH:   has a past medical history of CAD (coronary artery disease), Carotid artery disease (HCC), Cervical disc disease, CKD (chronic kidney disease), stage III ([place]), COPD (chronic obstructive pulmonary disease) ([place]), Diastolic dysfunction, Dysphagia, GERD (gastroesophageal reflux disease), History of kidney stones, Hypertension, Hypertriglyceridemia, Hypothyroidism, PAD (peripheral artery disease) ([place]), PAF (paroxysmal atrial fibrillation) ([place]), PONV (postoperative nausea and vomiting), and Right renal  artery stenosis ([place]).  PSH:    Past Surgical History:  Procedure Laterality Date  . ABDOMINAL HYSTERECTOMY    . AMPUTATION Right 04/12/2014    Procedure: Revision AMPUTATION Right Long DIGIT;  Surgeon: Leanora Cover, MD;  Location: [facility];  Service: Orthopedics;  Laterality: Right;  . APPLICATION OF WOUND VAC Left 01/06/2018   Procedure: APPLICATION OF WOUND VAC;  Surgeon: Samara Deist, DPM;  Location: [facility];  Service: Podiatry;  Laterality: Left;  . BALLOON DILATION  05/05/2011   Procedure: BALLOON DILATION;  Surgeon: Rogene Houston, MD;  [facility];  Service: Endoscopy;  Laterality: N/A;  . CARDIAC CATHETERIZATION     with stent 2004  CYPHER 2.75 x 71mm coronary stent in the mid left anterior descending stenotic lesion    Last stress test showed EF of 775  . CARDIOVERSION    . CATARACT EXTRACTION Left 2002  . CATARACT EXTRACTION W/PHACO Right 05/14/2013   Procedure: CATARACT EXTRACTION PHACO AND INTRAOCULAR LENS PLACEMENT RIGHT EYE;  Surgeon: Tonny Branch, MD;  Location: [facility];  Service: Ophthalmology;  Laterality: Right;  CDE:  20.45  . CERVICAL DISC SURGERY    . CHOLECYSTECTOMY    . CORONARY ANGIOPLASTY     1 stent  . ESOPHAGEAL DILATION N/A 04/25/2015   Procedure: ESOPHAGEAL DILATION;  Surgeon: Rogene Houston, MD;  [facility];  Service: Endoscopy;  Laterality: N/A;  . ESOPHAGOGASTRODUODENOSCOPY N/A 04/25/2015   Procedure: ESOPHAGOGASTRODUODENOSCOPY (EGD);  Surgeon: Rogene Houston, MD;  [facility];  Service: Endoscopy;  Laterality: N/A;  1225  . IRRIGATION AND DEBRIDEMENT FOOT Left 01/06/2018   Procedure: IRRIGATION AND DEBRIDEMENT FOOT;  Surgeon: Samara Deist, DPM;  Location: [facility];  Service: Podiatry;  Laterality: Left;  Marland Kitchen MALONEY DILATION  05/05/2011   Procedure: MALONEY DILATION;  Surgeon: Rogene Houston, MD;  [facility];  Service: Endoscopy;  Laterality: N/A;  . right knee arthroscopy  2010  . SAVORY DILATION  05/05/2011   Procedure: SAVORY DILATION;  Surgeon: Rogene Houston, MD;  [facility];  Service: Endoscopy;  Laterality: N/A;  . SHOULDER SURGERY  Right    torn rotator cuff  . TOENAIL EXCISION    . TOTAL ABDOMINAL HYSTERECTOMY W/ BILATERAL SALPINGOOPHORECTOMY      Current Outpatient Medications  Medication Sig Dispense Refill  . acetaminophen (TYLENOL) 500 MG tablet Take 500-1,000 mg by mouth every 6 (six) hours as needed for mild pain or moderate pain.    Marland Kitchen albuterol (PROVENTIL HFA;VENTOLIN HFA) 108 (90 Base) MCG/ACT inhaler Inhale 2 puffs into the lungs every 4 (four) hours as needed for wheezing or shortness of breath.    Marland Kitchen albuterol (PROVENTIL) (2.5 MG/3ML) 0.083% nebulizer solution Take 2.5 mg by nebulization every 6 (six) hours as needed for shortness of breath.     . Ascorbic Acid (VITAMIN C) 1000 MG tablet Take 1,000 mg by mouth daily.    . calcitRIOL (ROCALTROL) 0.5 MCG capsule Take 0.5 mcg by mouth daily.    . cetirizine (ZYRTEC) 10 MG tablet Take 10 mg by mouth daily as needed for allergies.    . cholecalciferol (VITAMIN D) 1000 units tablet Take 2,000 Units by mouth daily.    . clonazePAM (KLONOPIN) 1 MG tablet Take 0.5-1.5 mg by mouth 4 (four) times daily. Takes 0.5mg  three times a day and takes 1.5mg  at bedtime     . doxycycline (VIBRA-TABS) 100 MG tablet Take 1 tablet (100 mg total) by mouth  every 12 (twelve) hours. 8 tablet 0  . ezetimibe (ZETIA) 10 MG tablet Take 10 mg by mouth daily.    . Fluticasone-Salmeterol (ADVAIR) 500-50 MCG/DOSE AEPB Inhale 1 puff into the lungs 2 (two) times daily.    . furosemide (LASIX) 40 MG tablet Take 40 mg by mouth daily.    . hydrALAZINE (APRESOLINE) 50 MG tablet Take 1 tablet (50 mg total) by mouth 3 (three) times daily. 90 tablet 7  . isosorbide mononitrate (IMDUR) 60 MG 24 hr tablet TAKE ONE TABLET (60MG  TOTAL) BY MOUTH TWO TIMES DAILY 60 tablet 3  . levothyroxine (SYNTHROID, LEVOTHROID) 125 MCG tablet Take 125 mcg by mouth daily.    . nitroGLYCERIN (NITROLINGUAL) 0.4 MG/SPRAY spray Place 1 spray under the tongue every 5 (five) minutes x 3 doses as needed for chest pain. 12 g 3  .  omega-3 acid ethyl esters (LOVAZA) 1 G capsule Take 2 g by mouth 2 (two) times daily.      . ranitidine (ZANTAC) 150 MG tablet Take 150 mg by mouth daily as needed for heartburn.    . vitamin B-12 (CYANOCOBALAMIN) 1000 MCG tablet Take 1,000 mcg by mouth 2 (two) times daily.     No current facility-administered medications for this visit.      Allergies:   Ciprofloxacin; Ciprofloxacin hcl; Penicillins; Amlodipine; Cefaclor; Codeine; Lipitor [atorvastatin]; and Sulfa antibiotics   Social History:  The patient  reports that she has been smoking cigarettes.  She has a 56.00 pack-year smoking history. She has never used smokeless tobacco. She reports that she drinks about 0.6 oz of alcohol per week. She reports that she does not use drugs.   Family History:   family history includes Diabetes in her mother; Heart attack in her father; Heart disease in her brother and sister; Heart failure in her mother; Hypertension in her sister; Pancreatic cancer in her sister; Stroke in her mother.    Review of Systems: Review of Systems  Constitutional: Negative.   Respiratory: Positive for shortness of breath.   Cardiovascular: Positive for leg swelling.  Gastrointestinal: Negative.   Musculoskeletal: Negative.        Leg pain  Neurological: Negative.   Psychiatric/Behavioral: Negative.   All other systems reviewed and are negative.    PHYSICAL EXAM: VS:  BP (!) 140/50 (BP Location: Right Arm, Patient Position: Sitting, Cuff Size: Large)   Pulse 67   Ht 5' 6.5" (1.689 m)   Wt 225 lb (102.1 kg) Comment: weight checked at Dr. Vickey Sages office.  SpO2 94%   BMI 35.77 kg/m  , BMI Body mass index is 35.77 kg/m. Constitutional:  oriented to person, place, and time. No distress. presenting a wheelchair HENT:  Head: Normocephalic and atraumatic.  Eyes:  no discharge. No scleral icterus.  Neck: Normal range of motion. Neck supple. No JVD present.  Cardiovascular: Normal rate, regular rhythm, normal  heart sounds and intact distal pulses. Exam reveals no gallop and no friction rub. 2+ pitting edema to the thighs Ace wrap on left lower extremity No murmur heard. Pulmonary/Chest: Effort normal and breath sounds normal. No stridor. No respiratory distress.  no wheezes.  no rales.  no tenderness.  Abdominal: Soft.  no distension.  no tenderness.  Musculoskeletal: Normal range of motion.  no  tenderness or deformity.  Neurological:  normal muscle tone. Coordination normal. No atrophy Skin: Skin is warm and dry. No rash noted. not diaphoretic.  Psychiatric:  normal mood and affect. behavior is normal. Thought content normal.  Recent Labs: 12/18/2017: TSH 8.987 01/02/2018: ALT 22 01/10/2018: BUN 69; Creatinine, Ser 3.04; Hemoglobin 8.8; Platelets 216; Potassium 5.2; Sodium 134   Lipid Panel No results found for: CHOL, HDL, LDLCALC, TRIG    Wt Readings from Last 3 Encounters:  01/20/18 225 lb (102.1 kg)  01/10/18 227 lb 8 oz (103.2 kg)  12/20/17 211 lb 12.8 oz (96.1 kg)      ASSESSMENT AND PLAN:  Paroxysmal atrial fibrillation (HCC) - Plan: EKG 12-Lead Maintaining normal sinus rhythm, Anticoagulation held given anemia and high fall risk May need plastic surgery left lower extremity  Essential hypertension - Plan: EKG 12-Lead Blood pressure is well controlled on today's visit. No changes made to the medications.  Coronary artery disease involving native coronary artery of native heart without angina pectoris - Plan: EKG 12-Lead Currently with no symptoms of angina. No further workup at this time. Continue current medication regimen Reports that she stop smoking  Acute on chronic renal failure Stage IV Scheduled to see Nephrology in several days' time in [place] Worsening function over the past several months  Lower extremity edema Multifactorial including renal failure, anemia, diastolic heart failure Leg wraps on the left Very small on the right No continue Lasix 40 for  now. Could consider torsemide  Mixed hyperlipidemia She does not want a statin,   on Zetia  PAD Lower extremity arterial stenosis, 50-75% stenosis mid right SFA Likely not contributing to pain in her legs at rest and with minimal exertion  No further workup at this time  SOB (shortness of breath)  chronic bronchitis. COPD Reports that she stopped smoking one month ago  Leg pain /back pain Previously seen by neurosurgery Chronic issue, high fall risk   Total encounter time more than 45 minutes  Greater than 50% was spent in counseling and coordination of care with the patient     Orders Placed This Encounter  Procedures  . EKG 12-Lead     Signed, Esmond Plants, M.D., Ph.D. 01/20/2018  [place], [place]

## 2018-02-07 ENCOUNTER — Ambulatory Visit: Payer: Medicare Other

## 2018-02-08 ENCOUNTER — Ambulatory Visit
Admission: RE | Admit: 2018-02-08 | Discharge: 2018-02-08 | Disposition: A | Discharge status: Home / Self Care | Payer: Medicare Other | Source: Ambulatory Visit | Attending: Nephrology | Admitting: Nephrology

## 2018-02-08 DIAGNOSIS — D631 Anemia in chronic kidney disease: Secondary | ICD-10-CM | POA: Diagnosis present

## 2018-02-08 DIAGNOSIS — N184 Chronic kidney disease, stage 4 (severe): Secondary | ICD-10-CM | POA: Diagnosis not present

## 2018-02-08 HISTORY — DX: Anemia, unspecified: D64.9

## 2018-02-08 LAB — HEMOGLOBIN: HEMOGLOBIN: 9.5 g/dL — AB (ref 12.0–16.0)

## 2018-02-08 LAB — TRANSFERRIN: Transferrin: 204 mg/dL (ref 192–382)

## 2018-02-08 LAB — IRON AND TIBC
Iron: 75 ug/dL (ref 28–170)
Saturation Ratios: 28 % (ref 10.4–31.8)
TIBC: 271 ug/dL (ref 250–450)
UIBC: 196 ug/dL

## 2018-02-08 LAB — FERRITIN: Ferritin: 125 ng/mL (ref 11–307)

## 2018-02-08 MED ORDER — EPOETIN ALFA 10000 UNIT/ML IJ SOLN
INTRAMUSCULAR | Status: AC
  Filled 2018-02-08: qty 3

## 2018-02-08 MED ORDER — EPOETIN ALFA-EPBX 40000 UNIT/ML IJ SOLN
30000.0000 [IU] | Freq: Once | INTRAMUSCULAR | Status: AC
  Administered 2018-02-08: 30000 [IU] via SUBCUTANEOUS
  Filled 2018-02-08: qty 1

## 2018-02-08 MED ORDER — FERUMOXYTOL INJECTION 510 MG/17 ML
510.0000 mg | Freq: Once | INTRAVENOUS | Status: AC
  Administered 2018-02-08: 510 mg via INTRAVENOUS
  Filled 2018-02-08: qty 17

## 2018-02-08 MED ORDER — SODIUM CHLORIDE FLUSH 0.9 % IV SOLN
INTRAVENOUS | Status: AC
  Filled 2018-02-08: qty 10

## 2018-02-08 MED ORDER — SODIUM CHLORIDE FLUSH 0.9 % IV SOLN
INTRAVENOUS | Status: AC
  Administered 2018-02-08: 10 mL
  Filled 2018-02-08: qty 10

## 2018-02-15 ENCOUNTER — Ambulatory Visit
Admission: RE | Admit: 2018-02-15 | Discharge: 2018-02-15 | Disposition: A | Discharge status: Home / Self Care | Payer: Medicare Other | Source: Ambulatory Visit | Attending: Nephrology | Admitting: Nephrology

## 2018-02-15 DIAGNOSIS — D631 Anemia in chronic kidney disease: Secondary | ICD-10-CM | POA: Insufficient documentation

## 2018-02-15 DIAGNOSIS — N184 Chronic kidney disease, stage 4 (severe): Secondary | ICD-10-CM | POA: Insufficient documentation

## 2018-02-15 MED ORDER — SODIUM CHLORIDE 0.9 % IV SOLN
510.0000 mg | Freq: Once | INTRAVENOUS | Status: AC
  Administered 2018-02-15: 510 mg via INTRAVENOUS
  Filled 2018-02-15: qty 17

## 2018-02-16 ENCOUNTER — Telehealth: Payer: Self-pay | Admitting: Cardiovascular Disease

## 2018-02-16 NOTE — Telephone Encounter (Signed)
Nurse states pt has stopped Pradaxa due to recent falls, she asks if pt should restart this. Please call and advise

## 2018-02-16 NOTE — Telephone Encounter (Signed)
Depends on her balance, falls, leg hematoma etc. Perhaps a visit with coumadin clinic to make that decision?

## 2018-02-16 NOTE — Telephone Encounter (Signed)
Patient discharged from the hospital on 01/10/18 and was told to stop pradaxa. Patient saw Dr Rockey Situ on 01/20/18. Routing to Dr Rockey Situ for advice.

## 2018-02-17 NOTE — Telephone Encounter (Signed)
Reviewed with Dr. Rockey Situ and he requested that I reach out to patient to check on her current status and home situation. No answer/Left voicemail message to call back.

## 2018-02-17 NOTE — Telephone Encounter (Signed)
lmov to schedule appt  °

## 2018-02-17 NOTE — Telephone Encounter (Signed)
Please advise 

## 2018-02-21 NOTE — Telephone Encounter (Signed)
I left a message for the patient to call. 

## 2018-02-22 ENCOUNTER — Ambulatory Visit
Admission: RE | Admit: 2018-02-22 | Discharge: 2018-02-22 | Disposition: A | Discharge status: Home / Self Care | Payer: Medicare Other | Source: Ambulatory Visit | Attending: Nephrology | Admitting: Nephrology

## 2018-02-22 DIAGNOSIS — D631 Anemia in chronic kidney disease: Secondary | ICD-10-CM | POA: Insufficient documentation

## 2018-02-22 DIAGNOSIS — N184 Chronic kidney disease, stage 4 (severe): Secondary | ICD-10-CM | POA: Diagnosis present

## 2018-02-22 LAB — HEMOGLOBIN: HEMOGLOBIN: 11.4 g/dL — AB (ref 12.0–16.0)

## 2018-02-22 MED ORDER — EPOETIN ALFA-EPBX 40000 UNIT/ML IJ SOLN
30000.0000 [IU] | Freq: Once | INTRAMUSCULAR | Status: AC
  Administered 2018-02-22: 30000 [IU] via SUBCUTANEOUS
  Filled 2018-02-22: qty 1

## 2018-02-22 NOTE — OR Nursing (Signed)
Patient given appointment on 03/08/18 @ 10:30 for SDS for labs and medication.

## 2018-02-22 NOTE — OR Nursing (Signed)
Dr [name] notified per Phoebe Sharps RN of Hgb result of 11.4.  MD still wants Retacrit to be given.

## 2018-02-24 NOTE — Telephone Encounter (Signed)
Left voicemail message for patient to call back.

## 2018-02-27 ENCOUNTER — Other Ambulatory Visit (HOSPITAL_COMMUNITY): Payer: Self-pay | Admitting: Nurse Practitioner

## 2018-02-27 ENCOUNTER — Ambulatory Visit (HOSPITAL_COMMUNITY)
Admission: RE | Admit: 2018-02-27 | Discharge: 2018-02-27 | Disposition: A | Discharge status: Home / Self Care | Payer: Medicare Other | Source: Ambulatory Visit | Attending: Nurse Practitioner | Admitting: Nurse Practitioner

## 2018-02-27 DIAGNOSIS — W19XXXA Unspecified fall, initial encounter: Secondary | ICD-10-CM | POA: Insufficient documentation

## 2018-02-27 DIAGNOSIS — M79604 Pain in right leg: Secondary | ICD-10-CM | POA: Diagnosis present

## 2018-02-27 DIAGNOSIS — M25551 Pain in right hip: Secondary | ICD-10-CM | POA: Diagnosis not present

## 2018-02-27 DIAGNOSIS — W19XXXD Unspecified fall, subsequent encounter: Secondary | ICD-10-CM

## 2018-02-27 NOTE — Telephone Encounter (Signed)
Patient returning call.

## 2018-02-28 NOTE — Telephone Encounter (Signed)
Returned the call to the patient. She stated that she feels like she can be back on the Pradaxa. She stated that she does not feel dizzy and has not felt like she will lose her balance since the previous fall.

## 2018-03-02 NOTE — Telephone Encounter (Signed)
Ok to restart pradaxa

## 2018-03-03 NOTE — Telephone Encounter (Signed)
No answer. Left message to call back.   

## 2018-03-03 NOTE — Telephone Encounter (Signed)
Patient called and made aware of Dr. Donivan Scull recommendations.  Message routed to the provider for dosage amount of Pradaxa. According to the patient she has Pradaxa 150 mg bid.

## 2018-03-05 NOTE — Telephone Encounter (Signed)
Would not recommend anticoagulation with pradaxa at this time given creatinine of 3, kidney dysfunction GFR less than 20

## 2018-03-07 NOTE — Telephone Encounter (Signed)
Patient has been made aware to not take the Pradaxa now due to her kidney function. She verbalized her understanding.

## 2018-03-07 NOTE — Telephone Encounter (Signed)
Left a message for the patient to call back concerning not restarting the Pradaxa.

## 2018-03-07 NOTE — Telephone Encounter (Signed)
Patient returning call re: pradaxa.  Please call before 12 today

## 2018-03-08 ENCOUNTER — Ambulatory Visit
Admission: RE | Admit: 2018-03-08 | Discharge: 2018-03-08 | Disposition: A | Discharge status: Home / Self Care | Payer: Medicare Other | Source: Ambulatory Visit | Attending: Nephrology | Admitting: Nephrology

## 2018-03-08 DIAGNOSIS — D631 Anemia in chronic kidney disease: Secondary | ICD-10-CM | POA: Insufficient documentation

## 2018-03-08 DIAGNOSIS — N184 Chronic kidney disease, stage 4 (severe): Secondary | ICD-10-CM | POA: Diagnosis present

## 2018-03-08 LAB — HEMOGLOBIN: Hemoglobin: 12 g/dL (ref 12.0–16.0)

## 2018-03-08 MED ORDER — SODIUM CHLORIDE 0.9 % IV SOLN
510.0000 mg | Freq: Once | INTRAVENOUS | Status: AC
  Administered 2018-03-08: 510 mg via INTRAVENOUS
  Filled 2018-03-08: qty 17

## 2018-03-08 MED ORDER — SODIUM CHLORIDE FLUSH 0.9 % IV SOLN
INTRAVENOUS | Status: AC
  Filled 2018-03-08: qty 20

## 2018-03-14 ENCOUNTER — Ambulatory Visit: Payer: Medicare Other | Admitting: Physician Assistant

## 2018-03-14 ENCOUNTER — Other Ambulatory Visit: Payer: Self-pay | Admitting: *Deleted

## 2018-03-15 ENCOUNTER — Ambulatory Visit
Admission: RE | Admit: 2018-03-15 | Discharge: 2018-03-15 | Disposition: A | Discharge status: Home / Self Care | Payer: Medicare Other | Source: Ambulatory Visit | Attending: Nephrology | Admitting: Nephrology

## 2018-03-15 DIAGNOSIS — D631 Anemia in chronic kidney disease: Secondary | ICD-10-CM | POA: Insufficient documentation

## 2018-03-15 DIAGNOSIS — Z5181 Encounter for therapeutic drug level monitoring: Secondary | ICD-10-CM | POA: Insufficient documentation

## 2018-03-15 DIAGNOSIS — Z79899 Other long term (current) drug therapy: Secondary | ICD-10-CM | POA: Diagnosis not present

## 2018-03-15 DIAGNOSIS — N184 Chronic kidney disease, stage 4 (severe): Secondary | ICD-10-CM | POA: Diagnosis present

## 2018-03-15 LAB — IRON AND TIBC
Iron: 53 ug/dL (ref 28–170)
Saturation Ratios: 27 % (ref 10.4–31.8)
TIBC: 199 ug/dL — AB (ref 250–450)
UIBC: 146 ug/dL

## 2018-03-15 LAB — HEMOGLOBIN: HEMOGLOBIN: 12.2 g/dL (ref 12.0–16.0)

## 2018-03-15 LAB — FERRITIN: Ferritin: 738 ng/mL — ABNORMAL HIGH (ref 11–307)

## 2018-03-15 LAB — TRANSFERRIN: TRANSFERRIN: 146 mg/dL — AB (ref 192–382)

## 2018-03-15 NOTE — OR Nursing (Signed)
Hgb is 12.2 today. No retacrit given.

## 2018-03-21 ENCOUNTER — Telehealth: Payer: Self-pay | Admitting: Cardiovascular Disease

## 2018-03-21 NOTE — Telephone Encounter (Signed)
Please call regarding pt her blockage in her leg, could it cause her leg to hurt. Please call and discuss.

## 2018-03-21 NOTE — Telephone Encounter (Signed)
I called and spoke with the patient.  She states that she fell fairly recently and hurt her left leg. However, she is having pain in her right leg currently. This is almost constant pain on the front side of her leg from top to bottom.  It hurts her to just change position.  She cannot confirm if the right leg is different in color or temperature from the left.  I inquired if the patient is still smoking. She states she is still smoking about 3 cigarettes/ day. I asked if she ever started a walking program. She states she never heard from anyone about starting this.   I advised her I will forward to Dr. Fletcher Anon to review and we will call her back with further recommendations.  She voices understanding and is agreeable.

## 2018-03-21 NOTE — Telephone Encounter (Signed)
I do not think that kind of pain is related to peripheral arterial disease.  I suspect a musculoskeletal etiology and she should follow-up with her primary care physician regarding this.

## 2018-03-22 ENCOUNTER — Other Ambulatory Visit (HOSPITAL_COMMUNITY): Payer: Self-pay | Admitting: Nurse Practitioner

## 2018-03-22 DIAGNOSIS — M25551 Pain in right hip: Secondary | ICD-10-CM

## 2018-03-22 DIAGNOSIS — M545 Low back pain: Secondary | ICD-10-CM

## 2018-03-23 NOTE — Telephone Encounter (Signed)
I spoke with the patient and made her aware of Dr. Tyrell Antonio recommendations. She states she has followed up with her PCP.

## 2018-03-27 ENCOUNTER — Ambulatory Visit (HOSPITAL_COMMUNITY): Payer: Medicare Other

## 2018-03-27 ENCOUNTER — Other Ambulatory Visit (HOSPITAL_COMMUNITY): Payer: Medicare Other

## 2018-03-28 ENCOUNTER — Ambulatory Visit (HOSPITAL_COMMUNITY)
Admission: RE | Admit: 2018-03-28 | Discharge: 2018-03-28 | Disposition: A | Discharge status: Home / Self Care | Payer: Medicare Other | Source: Ambulatory Visit | Attending: Nurse Practitioner | Admitting: Nurse Practitioner

## 2018-03-28 ENCOUNTER — Other Ambulatory Visit (HOSPITAL_COMMUNITY): Payer: Self-pay | Admitting: Nurse Practitioner

## 2018-03-28 DIAGNOSIS — M87851 Other osteonecrosis, right femur: Secondary | ICD-10-CM | POA: Insufficient documentation

## 2018-03-28 DIAGNOSIS — R059 Cough, unspecified: Secondary | ICD-10-CM

## 2018-03-28 DIAGNOSIS — M4807 Spinal stenosis, lumbosacral region: Secondary | ICD-10-CM | POA: Diagnosis not present

## 2018-03-28 DIAGNOSIS — J441 Chronic obstructive pulmonary disease with (acute) exacerbation: Secondary | ICD-10-CM

## 2018-03-28 DIAGNOSIS — R05 Cough: Secondary | ICD-10-CM

## 2018-03-28 DIAGNOSIS — R600 Localized edema: Secondary | ICD-10-CM | POA: Insufficient documentation

## 2018-03-28 DIAGNOSIS — S76011A Strain of muscle, fascia and tendon of right hip, initial encounter: Secondary | ICD-10-CM | POA: Diagnosis not present

## 2018-03-28 DIAGNOSIS — M48061 Spinal stenosis, lumbar region without neurogenic claudication: Secondary | ICD-10-CM | POA: Diagnosis not present

## 2018-03-28 DIAGNOSIS — R918 Other nonspecific abnormal finding of lung field: Secondary | ICD-10-CM | POA: Diagnosis not present

## 2018-03-28 DIAGNOSIS — M1288 Other specific arthropathies, not elsewhere classified, other specified site: Secondary | ICD-10-CM | POA: Insufficient documentation

## 2018-03-28 DIAGNOSIS — M2578 Osteophyte, vertebrae: Secondary | ICD-10-CM | POA: Insufficient documentation

## 2018-03-28 DIAGNOSIS — M5126 Other intervertebral disc displacement, lumbar region: Secondary | ICD-10-CM | POA: Insufficient documentation

## 2018-03-28 DIAGNOSIS — X58XXXA Exposure to other specified factors, initial encounter: Secondary | ICD-10-CM | POA: Diagnosis not present

## 2018-03-28 DIAGNOSIS — M545 Low back pain: Secondary | ICD-10-CM

## 2018-03-28 DIAGNOSIS — M25551 Pain in right hip: Secondary | ICD-10-CM | POA: Diagnosis not present

## 2018-03-29 ENCOUNTER — Ambulatory Visit
Admission: RE | Admit: 2018-03-29 | Discharge: 2018-03-29 | Disposition: A | Discharge status: Home / Self Care | Payer: Medicare Other | Source: Ambulatory Visit | Attending: Nephrology | Admitting: Nephrology

## 2018-03-29 DIAGNOSIS — N184 Chronic kidney disease, stage 4 (severe): Secondary | ICD-10-CM | POA: Insufficient documentation

## 2018-03-29 DIAGNOSIS — D631 Anemia in chronic kidney disease: Secondary | ICD-10-CM | POA: Diagnosis present

## 2018-03-29 LAB — HEMOGLOBIN: HEMOGLOBIN: 12.5 g/dL (ref 12.0–16.0)

## 2018-03-29 NOTE — OR Nursing (Addendum)
Hemoglobin 12.5. No Retacrit administered. Patient will return for an appointment on 04/11/18 @ 1:00pm.  I left a message on Stacy's voicemail at Dr. [name]'s office regarding the patient's Hemoglobin being >12 for the last 2 visits. I faxed the patient's lab results as well. Fax confirmation received.

## 2018-04-11 ENCOUNTER — Other Ambulatory Visit: Payer: Self-pay | Admitting: Family Medicine

## 2018-04-11 ENCOUNTER — Ambulatory Visit: Admission: RE | Admit: 2018-04-11 | Payer: Medicare Other | Source: Ambulatory Visit

## 2018-04-11 ENCOUNTER — Ambulatory Visit: Payer: Medicare Other | Admitting: Cardiovascular Disease

## 2018-04-11 DIAGNOSIS — M5441 Lumbago with sciatica, right side: Secondary | ICD-10-CM

## 2018-04-12 ENCOUNTER — Ambulatory Visit
Admission: RE | Admit: 2018-04-12 | Discharge: 2018-04-12 | Disposition: A | Discharge status: Home / Self Care | Payer: Medicare Other | Source: Ambulatory Visit | Attending: Nephrology | Admitting: Nephrology

## 2018-04-12 DIAGNOSIS — N184 Chronic kidney disease, stage 4 (severe): Secondary | ICD-10-CM | POA: Insufficient documentation

## 2018-04-12 DIAGNOSIS — D631 Anemia in chronic kidney disease: Secondary | ICD-10-CM | POA: Diagnosis not present

## 2018-04-12 LAB — IRON AND TIBC
Iron: 82 ug/dL (ref 28–170)
SATURATION RATIOS: 38 % — AB (ref 10.4–31.8)
TIBC: 215 ug/dL — AB (ref 250–450)
UIBC: 133 ug/dL

## 2018-04-12 LAB — FERRITIN: Ferritin: 549 ng/mL — ABNORMAL HIGH (ref 11–307)

## 2018-04-12 LAB — HEMOGLOBIN: Hemoglobin: 12.7 g/dL (ref 12.0–15.0)

## 2018-04-12 LAB — TRANSFERRIN: Transferrin: 148 mg/dL — ABNORMAL LOW (ref 192–382)

## 2018-04-25 ENCOUNTER — Other Ambulatory Visit: Payer: Self-pay | Admitting: Family Medicine

## 2018-04-25 ENCOUNTER — Ambulatory Visit
Admission: RE | Admit: 2018-04-25 | Discharge: 2018-04-25 | Disposition: A | Discharge status: Home / Self Care | Payer: Medicare Other | Source: Ambulatory Visit | Attending: Family Medicine | Admitting: Family Medicine

## 2018-04-25 DIAGNOSIS — G8929 Other chronic pain: Secondary | ICD-10-CM

## 2018-04-25 DIAGNOSIS — M5441 Lumbago with sciatica, right side: Secondary | ICD-10-CM

## 2018-04-25 DIAGNOSIS — M545 Low back pain, unspecified: Secondary | ICD-10-CM

## 2018-04-25 MED ORDER — METHYLPREDNISOLONE ACETATE 40 MG/ML INJ SUSP (RADIOLOG
120.0000 mg | Freq: Once | INTRAMUSCULAR | Status: AC
  Administered 2018-04-25: 120 mg via EPIDURAL

## 2018-04-25 MED ORDER — IOPAMIDOL (ISOVUE-M 200) INJECTION 41%
1.0000 mL | Freq: Once | INTRAMUSCULAR | Status: AC
  Administered 2018-04-25: 1 mL via EPIDURAL

## 2018-04-27 ENCOUNTER — Ambulatory Visit
Admission: RE | Admit: 2018-04-27 | Discharge: 2018-04-27 | Disposition: A | Discharge status: Home / Self Care | Payer: Medicare Other | Source: Ambulatory Visit | Attending: Nephrology | Admitting: Nephrology

## 2018-04-27 DIAGNOSIS — D631 Anemia in chronic kidney disease: Secondary | ICD-10-CM | POA: Insufficient documentation

## 2018-04-27 DIAGNOSIS — N184 Chronic kidney disease, stage 4 (severe): Secondary | ICD-10-CM | POA: Insufficient documentation

## 2018-04-27 LAB — HEMOGLOBIN: HEMOGLOBIN: 11.4 g/dL — AB (ref 12.0–15.0)

## 2018-04-27 MED ORDER — EPOETIN ALFA-EPBX 40000 UNIT/ML IJ SOLN
30000.0000 [IU] | Freq: Once | INTRAMUSCULAR | Status: AC
  Administered 2018-04-27 (×3): 10000 [IU] via SUBCUTANEOUS
  Filled 2018-04-27: qty 1

## 2018-04-27 NOTE — Progress Notes (Signed)
Medication ordered was retacrit 30,000 units out of a 40,000 unit 1 cc vial. Medication received from pharmacy was Retacit 10000 units per ml ( 3 vials) 3 injections given to patient. Patients states this is how she has been receiving med confirmed dose with Deb charge nurse medication given.

## 2018-05-01 ENCOUNTER — Other Ambulatory Visit: Payer: Self-pay | Admitting: Nurse Practitioner

## 2018-05-01 DIAGNOSIS — M542 Cervicalgia: Secondary | ICD-10-CM

## 2018-05-02 ENCOUNTER — Other Ambulatory Visit (HOSPITAL_COMMUNITY): Payer: Self-pay | Admitting: Nurse Practitioner

## 2018-05-02 ENCOUNTER — Ambulatory Visit (HOSPITAL_COMMUNITY)
Admission: RE | Admit: 2018-05-02 | Discharge: 2018-05-02 | Disposition: A | Discharge status: Home / Self Care | Payer: Medicare Other | Source: Ambulatory Visit | Attending: Nurse Practitioner | Admitting: Nurse Practitioner

## 2018-05-02 DIAGNOSIS — J449 Chronic obstructive pulmonary disease, unspecified: Secondary | ICD-10-CM | POA: Diagnosis present

## 2018-05-02 DIAGNOSIS — W19XXXA Unspecified fall, initial encounter: Secondary | ICD-10-CM | POA: Diagnosis not present

## 2018-05-02 DIAGNOSIS — F17200 Nicotine dependence, unspecified, uncomplicated: Secondary | ICD-10-CM | POA: Diagnosis not present

## 2018-05-02 DIAGNOSIS — I700 Atherosclerosis of aorta: Secondary | ICD-10-CM | POA: Insufficient documentation

## 2018-05-02 DIAGNOSIS — M5136 Other intervertebral disc degeneration, lumbar region: Secondary | ICD-10-CM | POA: Insufficient documentation

## 2018-05-11 ENCOUNTER — Telehealth (HOSPITAL_COMMUNITY): Payer: Self-pay

## 2018-05-11 ENCOUNTER — Other Ambulatory Visit (HOSPITAL_COMMUNITY): Payer: Self-pay | Admitting: Interventional Radiology

## 2018-05-11 ENCOUNTER — Ambulatory Visit
Admission: RE | Admit: 2018-05-11 | Discharge: 2018-05-11 | Disposition: A | Discharge status: Home / Self Care | Payer: Medicare Other | Source: Ambulatory Visit | Attending: Nephrology | Admitting: Nephrology

## 2018-05-11 DIAGNOSIS — D631 Anemia in chronic kidney disease: Secondary | ICD-10-CM | POA: Diagnosis not present

## 2018-05-11 DIAGNOSIS — N184 Chronic kidney disease, stage 4 (severe): Secondary | ICD-10-CM | POA: Insufficient documentation

## 2018-05-11 DIAGNOSIS — S32010A Wedge compression fracture of first lumbar vertebra, initial encounter for closed fracture: Secondary | ICD-10-CM

## 2018-05-11 DIAGNOSIS — W19XXXA Unspecified fall, initial encounter: Secondary | ICD-10-CM

## 2018-05-11 LAB — IRON AND TIBC
IRON: 108 ug/dL (ref 28–170)
Saturation Ratios: 43 % — ABNORMAL HIGH (ref 10.4–31.8)
TIBC: 250 ug/dL (ref 250–450)
UIBC: 142 ug/dL

## 2018-05-11 LAB — TRANSFERRIN: Transferrin: 183 mg/dL — ABNORMAL LOW (ref 192–382)

## 2018-05-11 LAB — FERRITIN: FERRITIN: 333 ng/mL — AB (ref 11–307)

## 2018-05-11 LAB — HEMOGLOBIN: Hemoglobin: 13.2 g/dL (ref 12.0–15.0)

## 2018-05-11 NOTE — Telephone Encounter (Signed)
Called to schedule mri, no answer, left vm. AW 

## 2018-05-12 ENCOUNTER — Other Ambulatory Visit (HOSPITAL_COMMUNITY): Payer: Self-pay | Admitting: Family Medicine

## 2018-05-12 ENCOUNTER — Ambulatory Visit (HOSPITAL_COMMUNITY)
Admission: RE | Admit: 2018-05-12 | Discharge: 2018-05-12 | Disposition: A | Discharge status: Home / Self Care | Payer: Medicare Other | Source: Ambulatory Visit | Attending: Family Medicine | Admitting: Family Medicine

## 2018-05-12 DIAGNOSIS — M25551 Pain in right hip: Secondary | ICD-10-CM | POA: Diagnosis present

## 2018-05-17 ENCOUNTER — Encounter: Payer: Self-pay | Admitting: Podiatry

## 2018-05-17 ENCOUNTER — Telehealth (HOSPITAL_COMMUNITY): Payer: Self-pay | Admitting: Radiology

## 2018-05-17 ENCOUNTER — Encounter

## 2018-05-17 ENCOUNTER — Ambulatory Visit: Payer: Medicare Other | Admitting: Podiatry

## 2018-05-17 DIAGNOSIS — M779 Enthesopathy, unspecified: Secondary | ICD-10-CM

## 2018-05-17 DIAGNOSIS — M778 Other enthesopathies, not elsewhere classified: Secondary | ICD-10-CM

## 2018-05-17 NOTE — Telephone Encounter (Signed)
Called pt, let her know that Deveshwar would review her MRI and I would call her back, hopefully tomorrow. She agreed with this plan of care. JM

## 2018-05-17 NOTE — Progress Notes (Signed)
She presents today states that I need a shot in my big toe started hurting again but it has lasted for about 6 months.  Objective: Vital signs are stable she is alert and oriented x3 pulses to the right foot are intact.  She has pain on range of motion of the first metatarsophalangeal joint.  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  Plan: After sterile Betadine skin prep I injected 20 mg Kenalog 5 mg Marcaine point maximal tenderness into the joint with a 30-gauge needle.  Tolerated procedure well follow-up with her as needed.

## 2018-05-18 ENCOUNTER — Ambulatory Visit
Admission: RE | Admit: 2018-05-18 | Discharge: 2018-05-18 | Disposition: A | Discharge status: Home / Self Care | Payer: Medicare Other | Source: Ambulatory Visit | Attending: Family Medicine | Admitting: Family Medicine

## 2018-05-18 DIAGNOSIS — M545 Low back pain: Principal | ICD-10-CM

## 2018-05-18 DIAGNOSIS — G8929 Other chronic pain: Secondary | ICD-10-CM

## 2018-05-18 MED ORDER — IOPAMIDOL (ISOVUE-M 200) INJECTION 41%
1.0000 mL | Freq: Once | INTRAMUSCULAR | Status: AC
  Administered 2018-05-18: 1 mL via EPIDURAL

## 2018-05-18 MED ORDER — METHYLPREDNISOLONE ACETATE 40 MG/ML INJ SUSP (RADIOLOG
120.0000 mg | Freq: Once | INTRAMUSCULAR | Status: AC
  Administered 2018-05-18: 120 mg via EPIDURAL

## 2018-05-19 ENCOUNTER — Ambulatory Visit (HOSPITAL_COMMUNITY)
Admission: RE | Admit: 2018-05-19 | Discharge: 2018-05-19 | Disposition: A | Discharge status: Home / Self Care | Payer: Medicare Other | Source: Ambulatory Visit | Attending: Interventional Radiology | Admitting: Interventional Radiology

## 2018-05-19 DIAGNOSIS — S32010A Wedge compression fracture of first lumbar vertebra, initial encounter for closed fracture: Secondary | ICD-10-CM

## 2018-05-19 DIAGNOSIS — S3210XA Unspecified fracture of sacrum, initial encounter for closed fracture: Secondary | ICD-10-CM | POA: Diagnosis not present

## 2018-05-19 DIAGNOSIS — W19XXXA Unspecified fall, initial encounter: Secondary | ICD-10-CM | POA: Diagnosis not present

## 2018-05-19 DIAGNOSIS — M48061 Spinal stenosis, lumbar region without neurogenic claudication: Secondary | ICD-10-CM | POA: Diagnosis not present

## 2018-05-19 DIAGNOSIS — M47816 Spondylosis without myelopathy or radiculopathy, lumbar region: Secondary | ICD-10-CM | POA: Insufficient documentation

## 2018-05-19 DIAGNOSIS — M4316 Spondylolisthesis, lumbar region: Secondary | ICD-10-CM | POA: Diagnosis not present

## 2018-05-19 DIAGNOSIS — S32019A Unspecified fracture of first lumbar vertebra, initial encounter for closed fracture: Secondary | ICD-10-CM | POA: Diagnosis not present

## 2018-05-21 NOTE — Progress Notes (Signed)
Cardiology Office Note  Date:  05/22/2018   ID:  [name], DOB 1940/08/11, MRN [id number]  PCP:  Lemmie Evens, MD   Chief Complaint  Patient presents with  . other    Pt. c/o bilateral leg pain but mostly in left now since she took a fall on Oct. 25, 2019. Meds reviewed by the pt. verbally.     HPI:  Ms. 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 Jimmy Footman in [place]  reports that she is DNR/DNI, she has told this to family, has papers in place who presents for routine followup of her coronary artery disease And leg swelling.  In follow-up today denies any significant cardiac issues She is having chronic severe pain Tail hurts worse after a fall 3 weeks ago Xray:05/19/2018  Acute sacral fracture with presacral edema. . Stable old mild T12 and L1 compression fractures. Stable grade 1 L1-2 RIGHT retrolisthesis and grade 1 L4-5 anterolisthesis without spondylolysis. 3. 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 Moderate canal stenosis L2-3. 4. Neural foraminal narrowing L2-3 through L5-S1: Moderate to severe L3-4 and L4-5.  Lab work reviewed with her HGB 13 Iron is stable  Leg swelling has resolved Sitting in a wheelchair on a pillow today Getting over " pneumonia", received antibiotics by primary care, still with chronic thick cough sometimes productive  Off plavix and asa : "Kidney doctor stopped it" GFR 25 per the patient Lab work was requested creatinine 2.36 in July 2019  Denies any tachycardia concerning for arrhythmia, atrial  fibrillation  EKG personally reviewed by myself on todays visit Shows sinus rhythm rate 62 bpm no significant ST or T wave changes  Other past medical history reviewed Several recent hospitalizations with discharge 12/20/2017 And discharge 01/10/2018 Presented initially to the hospital after a fall ( had been in the hospital at [facility] several days earlier for fall as well), skin tear left lower extremity, very weak,  Worsening anemia, acute on chronic renal failure Diuretics held and started on IV fluids with improvement of her renal function, discharged home with IV fluids Echocardiogram showing ejection fraction 65-70 percent  readmitted several weeks later Wound infection and shortness of breath, wound was foul-smelling and draining Bradycardic on arrival, rate in the 40s, pressure 527 systolic, Creatinine 2.9 BUN 54 sodium 131 hematocrit 25 Creatinine did achieve 1.33 with fluids, then with quick trend up to more than 3 from July 4 to time of discharge. Lasix held last several days of her hospitalization She left AMA, ischarged with antibiotics Treated with IV steroids for COPD exacerbation and chronic wheezing Anticoagulation/pradaxa held in the setting of her anemia and leg wound Amiodarone and metoprolol held for bradycardia She refused rehabilitation or placement  Previous workup for lower extremity vascular disease   showing moderate nonobstructive disease, medical management recommended    She does not want a statin  Thyroid medication recently increased. Initial TSH 18.8, down to 12 in July 2016. Managed by primary care We did talk about side effects of amiodarone and she does not want to change the medication She does not  want a statin   Cardioversion 05/25/2013 which was successful in restoring normal sinus rhythm.   converted from atrial fibrillation to atrial flutter at the time of the cardioversion.  PMH:   has a past medical history of Anemia, CAD (coronary artery  disease), Carotid artery disease (HCC), Cervical disc disease, CKD (chronic kidney disease), stage III ([place]), COPD (chronic obstructive pulmonary disease) ([place]), Diastolic dysfunction, Dysphagia, GERD (gastroesophageal reflux disease), History of kidney stones, Hypertension, Hypertriglyceridemia, Hypothyroidism, PAD (peripheral artery disease) ([place]), PAF (paroxysmal atrial fibrillation) ([place]), PONV (postoperative nausea and vomiting), and Right renal artery stenosis ([place]).  PSH:    Past Surgical History:  Procedure Laterality Date  . ABDOMINAL HYSTERECTOMY    . AMPUTATION Right 04/12/2014   Procedure: Revision AMPUTATION Right Long DIGIT;  Surgeon: Leanora Cover, MD;  Location: [facility];  Service: Orthopedics;  Laterality: Right;  . APPLICATION OF WOUND VAC Left 01/06/2018   Procedure: APPLICATION OF WOUND VAC;  Surgeon: Samara Deist, DPM;  Location: [facility];  Service: Podiatry;  Laterality: Left;  . BALLOON DILATION  05/05/2011   Procedure: BALLOON DILATION;  Surgeon: Rogene Houston, MD;  [facility];  Service: Endoscopy;  Laterality: N/A;  . CARDIAC CATHETERIZATION     with stent 2004  CYPHER 2.75 x 47mm coronary stent in the mid left anterior descending stenotic lesion    Last stress test showed EF of 775  . CARDIOVERSION    . CATARACT EXTRACTION Left 2002  . CATARACT EXTRACTION W/PHACO Right 05/14/2013   Procedure: CATARACT EXTRACTION PHACO AND INTRAOCULAR LENS PLACEMENT RIGHT EYE;  Surgeon: Tonny Branch, MD;  Location: [facility];  Service: Ophthalmology;  Laterality: Right;  CDE:  20.45  . CERVICAL DISC SURGERY    . CHOLECYSTECTOMY    . CORONARY ANGIOPLASTY     1 stent  . ESOPHAGEAL DILATION N/A 04/25/2015   Procedure: ESOPHAGEAL DILATION;  Surgeon: Rogene Houston, MD;  [facility];  Service: Endoscopy;  Laterality: N/A;  . ESOPHAGOGASTRODUODENOSCOPY N/A 04/25/2015   Procedure: ESOPHAGOGASTRODUODENOSCOPY (EGD);  Surgeon: Rogene Houston, MD;  [facility];   Service: Endoscopy;  Laterality: N/A;  1225  . IRRIGATION AND DEBRIDEMENT FOOT Left 01/06/2018   Procedure: IRRIGATION AND DEBRIDEMENT FOOT;  Surgeon: Samara Deist, DPM;  Location: [facility];  Service: Podiatry;  Laterality: Left;  Marland Kitchen MALONEY DILATION  05/05/2011   Procedure: MALONEY DILATION;  Surgeon: Rogene Houston, MD;  [facility];  Service: Endoscopy;  Laterality: N/A;  . right knee arthroscopy  2010  . SAVORY DILATION  05/05/2011   Procedure: SAVORY DILATION;  Surgeon: Rogene Houston, MD;  [facility];  Service: Endoscopy;  Laterality: N/A;  . SHOULDER SURGERY Right    torn rotator cuff  . TOENAIL EXCISION    . TOTAL ABDOMINAL HYSTERECTOMY W/ BILATERAL SALPINGOOPHORECTOMY      Current Outpatient Medications  Medication Sig Dispense Refill  . acetaminophen (TYLENOL) 500 MG tablet Take 500-1,000 mg by mouth every 6 (six) hours as needed for mild pain or moderate pain.    Marland Kitchen albuterol (PROVENTIL HFA;VENTOLIN HFA) 108 (90 Base) MCG/ACT inhaler Inhale 2 puffs into the lungs every 4 (four) hours as needed for wheezing or shortness of breath.    Marland Kitchen albuterol (PROVENTIL) (2.5 MG/3ML) 0.083% nebulizer solution Take 2.5 mg by nebulization every 6 (six) hours as needed for shortness of breath.     . Ascorbic Acid (VITAMIN C) 1000 MG tablet Take 1,000 mg by mouth daily.    Marland Kitchen  calcitRIOL (ROCALTROL) 0.5 MCG capsule Take 0.5 mcg by mouth daily.    . clonazePAM (KLONOPIN) 1 MG tablet Take 0.5-1.5 mg by mouth 4 (four) times daily. Takes 0.5mg  three times a day and takes 1.5mg  at bedtime     . Fluticasone-Salmeterol (ADVAIR) 500-50 MCG/DOSE AEPB Inhale 1 puff into the lungs 2 (two) times daily.    . furosemide (LASIX) 40 MG tablet Take 40 mg by mouth daily.    . isosorbide mononitrate (IMDUR) 60 MG 24 hr tablet TAKE ONE TABLET (60MG  TOTAL) BY MOUTH TWO TIMES DAILY 60 tablet 3  . levothyroxine (SYNTHROID, LEVOTHROID) 112 MCG tablet Take 112 mcg by mouth daily.    . nitroGLYCERIN  (NITROLINGUAL) 0.4 MG/SPRAY spray Place 1 spray under the tongue every 5 (five) minutes x 3 doses as needed for chest pain. 12 g 3  . omega-3 acid ethyl esters (LOVAZA) 1 G capsule Take 2 g by mouth 2 (two) times daily.      . vitamin B-12 (CYANOCOBALAMIN) 1000 MCG tablet Take 1,000 mcg by mouth 2 (two) times daily.     No current facility-administered medications for this visit.      Allergies:   Ciprofloxacin; Penicillins; Amlodipine; Cefaclor; Codeine; Lipitor [atorvastatin]; Prednisone; and Sulfa antibiotics   Social History:  The patient  reports that she has been smoking cigarettes. She has a 56.00 pack-year smoking history. She has never used smokeless tobacco. She reports that she drinks about 1.0 standard drinks of alcohol per week. She reports that she does not use drugs.   Family History:   family history includes Diabetes in her mother; Heart attack in her father; Heart disease in her brother and sister; Heart failure in her mother; Hypertension in her sister; Pancreatic cancer in her sister; Stroke in her mother.    Review of Systems: Review of Systems  Constitutional: Negative.   Respiratory: Positive for cough and shortness of breath.   Cardiovascular: Negative.   Gastrointestinal: Negative.   Musculoskeletal: Negative.        Leg pain  Neurological: Negative.   Psychiatric/Behavioral: Negative.   All other systems reviewed and are negative.    PHYSICAL EXAM: VS:  BP (!) 160/68 (BP Location: Left Arm, Patient Position: Sitting, Cuff Size: Normal)   Pulse 62   Ht 5\' 7"  (1.702 m)   Wt 178 lb (80.7 kg) Comment: checked on home scale last night; pt. unable to stand  BMI 27.88 kg/m  , BMI Body mass index is 27.88 kg/m. Constitutional:  oriented to person, place, and time. No distress. presenting a wheelchair HENT:  Head: Normocephalic and atraumatic.  Eyes:  no discharge. No scleral icterus.  Neck: Normal range of motion. Neck supple. No JVD present.   Cardiovascular: Normal rate, regular rhythm, normal heart sounds and intact distal pulses. Exam reveals no gallop and no friction rub. 2+ pitting edema to the thighs Ace wrap on left lower extremity No murmur heard. Pulmonary/Chest: Effort normal and breath sounds normal. No stridor. No respiratory distress.  no wheezes.  no rales.  no tenderness.  Abdominal: Soft.  no distension.  no tenderness.  Musculoskeletal: Normal range of motion.  no  tenderness or deformity.  Neurological:  normal muscle tone. Coordination normal. No atrophy Skin: Skin is warm and dry. No rash noted. not diaphoretic.  Psychiatric:  normal mood and affect. behavior is normal. Thought content normal.    Recent Labs: 12/18/2017: TSH 8.987 01/02/2018: ALT 22 01/10/2018: BUN 69; Creatinine, Ser 3.04; Platelets 216; Potassium 5.2;  Sodium 134 05/11/2018: Hemoglobin 13.2   Lipid Panel No results found for: CHOL, HDL, LDLCALC, TRIG    Wt Readings from Last 3 Encounters:  05/22/18 178 lb (80.7 kg)  01/20/18 225 lb (102.1 kg)  01/10/18 227 lb 8 oz (103.2 kg)      ASSESSMENT AND PLAN:  Paroxysmal atrial fibrillation (HCC) - Plan: EKG 12-Lead Maintaining normal sinus rhythm, Anticoagulation held given anemia and high fall risk Still very weak, fall 3 weeks ago, fracture to sacrum Risk of anticoagulation outweighs benefit  Essential hypertension - Plan: EKG 12-Lead Blood pressure elevated but likely secondary to severe pain No medication changes made  Coronary artery disease involving native coronary artery of native heart without angina pectoris - Plan: EKG 12-Lead Currently with no symptoms of angina. No further workup at this time. Continue current medication regimen.  Stable  Acute on chronic renal failure Stage IV Followed in [facility] She reports GFR 25  Lower extremity edema Edema has resolved on today's visit No changes to medications  Mixed hyperlipidemia She does not want a statin,   on  Zetia  PAD Lower extremity arterial stenosis, 50-75% stenosis mid right SFA She is very sedentary at baseline, not a very far  SOB (shortness of breath)  chronic bronchitis. COPD recently treated with antibiotics for bronchitis Still with thick productive cough but she reports it is stable  Leg pain /back pain Previously seen by neurosurgery Chronic issue, high fall risk   Total encounter time more than 25 minutes  Greater than 50% was spent in counseling and coordination of care with the patient     Orders Placed This Encounter  Procedures  . EKG 12-Lead     Signed, Esmond Plants, M.D., Ph.D. 05/22/2018  [place], [place]

## 2018-05-22 ENCOUNTER — Other Ambulatory Visit (HOSPITAL_COMMUNITY): Payer: Self-pay | Admitting: Interventional Radiology

## 2018-05-22 ENCOUNTER — Ambulatory Visit: Payer: Medicare Other | Admitting: Cardiovascular Disease

## 2018-05-22 ENCOUNTER — Telehealth (HOSPITAL_COMMUNITY): Payer: Self-pay

## 2018-05-22 ENCOUNTER — Encounter: Payer: Self-pay | Admitting: Cardiovascular Disease

## 2018-05-22 VITALS — BP 160/68 | HR 62 | Ht 67.0 in | Wt 178.0 lb

## 2018-05-22 DIAGNOSIS — E782 Mixed hyperlipidemia: Secondary | ICD-10-CM

## 2018-05-22 DIAGNOSIS — R0602 Shortness of breath: Secondary | ICD-10-CM

## 2018-05-22 DIAGNOSIS — S3210XA Unspecified fracture of sacrum, initial encounter for closed fracture: Secondary | ICD-10-CM

## 2018-05-22 DIAGNOSIS — I10 Essential (primary) hypertension: Secondary | ICD-10-CM

## 2018-05-22 DIAGNOSIS — I739 Peripheral vascular disease, unspecified: Secondary | ICD-10-CM

## 2018-05-22 DIAGNOSIS — J432 Centrilobular emphysema: Secondary | ICD-10-CM | POA: Diagnosis not present

## 2018-05-22 DIAGNOSIS — N183 Chronic kidney disease, stage 3 unspecified: Secondary | ICD-10-CM

## 2018-05-22 DIAGNOSIS — I25118 Atherosclerotic heart disease of native coronary artery with other forms of angina pectoris: Secondary | ICD-10-CM

## 2018-05-22 NOTE — Patient Instructions (Addendum)
Medication Instructions:  No changes  Ask nephrology   If you need a refill on your cardiac medications before your next appointment, please call your pharmacy.    Lab work: No new labs needed   If you have labs (blood work) drawn today and your tests are completely normal, you will receive your results only by: Marland Kitchen MyChart Message (if you have MyChart) OR . A paper copy in the mail If you have any lab test that is abnormal or we need to change your treatment, we will call you to review the results.   Testing/Procedures: No new testing needed   Follow-Up: At [facility], you and your health needs are our priority.  As part of our continuing mission to provide you with exceptional heart care, we have created designated Provider Care Teams.  These Care Teams include your primary Cardiologist (physician) and Advanced Practice Providers (APPs -  Physician Assistants and Nurse Practitioners) who all work together to provide you with the care you need, when you need it.  . You will need a follow up appointment in 6 months .   Please call our office 2 months in advance to schedule this appointment.    . Providers on your designated Care Team:   . Murray Hodgkins, NP . Christell Faith, PA-C . Marrianne Mood, PA-C  Any Other Special Instructions Will Be Listed Below (If Applicable).  For educational health videos Log in to : [email or web address] Or : [email or web address], password : [email or web address]

## 2018-05-22 NOTE — Telephone Encounter (Signed)
Called to schedule consult, no answer, left vm. AW  

## 2018-05-24 ENCOUNTER — Ambulatory Visit (HOSPITAL_COMMUNITY)
Admission: RE | Admit: 2018-05-24 | Discharge: 2018-05-24 | Disposition: A | Discharge status: Home / Self Care | Payer: Medicare Other | Source: Ambulatory Visit | Attending: Interventional Radiology | Admitting: Interventional Radiology

## 2018-05-24 ENCOUNTER — Other Ambulatory Visit (HOSPITAL_COMMUNITY): Payer: Self-pay | Admitting: Interventional Radiology

## 2018-05-24 DIAGNOSIS — S3210XA Unspecified fracture of sacrum, initial encounter for closed fracture: Secondary | ICD-10-CM

## 2018-05-24 NOTE — Consult Note (Signed)
Chief Complaint: Patient was seen in consultation today for sacral fracture.  Referring Physician(s): Carlis Abbott  Supervising Physician: Luanne Bras  Patient Status: [facility] - Out-pt  History of Present Illness: [name] is a 77 y.o. female with a past medical history of hypertension, hypertriglyceridemia, paroxysmal atrial fibrillation, CAD, diastolic HF, PAD, COPD, dysphasia, GERD, CKD stage III, right renal artery stenosis, nephrolithiasis, hypothyroidism, obesity, anemia, osteoarthritis, gout, anxiety, and insomnia. A few weeks ago, patient fell on her buttocks and experienced immediate lower back pain. She went to her PCP for evaluation. Her back pain has continued to worsen since.  MRI lumbar spine 05/19/2018: 1. Acute sacral fracture with presacral edema. 2. Stable old mild T12 and L1 compression fractures. Stable grade 1 L1-2 RIGHT retrolisthesis and grade 1 L4-5 anterolisthesis without spondylolysis. 3. 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 Moderate canal stenosis L2-3. 4. Neural foraminal narrowing L2-3 through L5-S1: Moderate to severe L3-4 and L4-5.  IR requested by Carlis Abbott, NP for management of sacral fracture. Patient awake and alert sitting in wheelchair. Accompanied by husband. Complains of extreme low back pain. Patient states that her pain is so severe that she cannot sleep or walk. States she uses a walker when ambulating. Denies numbness/tingling down legs, bladder/bowel incontinence, or urinary symptoms including dysuria or frequency.    Past Medical History:  Diagnosis Date  . Anemia   . CAD (coronary artery disease)    a. 10/2002 PCI: LAD 71m (2.75x23 Cypher DES); b. 09/2004 Cath: LM nl, LAD 40p, patent stent, D1 30p, LCX 20p, diff dzs throughout, OM1 50p, OM2 60p, OM3 40p, RCA 40p-->Med Rx; c. 10/2012 MV: No ischemia/infarct.  . Carotid artery disease ([place])   . Cervical disc disease     . CKD (chronic kidney disease), stage III ([place])   . COPD (chronic obstructive pulmonary disease) ([place])   . Diastolic dysfunction    a. 03/2017 Echo: Ef 60-65%, Gr2 DD, mild MR, mildly dil LA/RA, mildly to mod dil RV w/ nl fxn, PASP 68mmHg.  Marland Kitchen Dysphagia   . GERD (gastroesophageal reflux disease)   . History of kidney stones   . Hypertension    2002  . Hypertriglyceridemia   . Hypothyroidism   . PAD (peripheral artery disease) ([place])    a. 10/2017 ABI's R = 0.78, L = 0.88. Duplex showed borderline R CFA and SFA dzs->med rx.  Marland Kitchen PAF (paroxysmal atrial fibrillation) ([place])    a. Amio/Pradaxa (CHA2DS2VASc = 6).  Marland Kitchen PONV (postoperative nausea and vomiting)   . Right renal artery stenosis ([place])    a. 09/2017 Renal Artery Duplex: no significant LRA stenosis. RRA <60%.    Past Surgical History:  Procedure Laterality Date  . ABDOMINAL HYSTERECTOMY    . AMPUTATION Right 04/12/2014   Procedure: Revision AMPUTATION Right Long DIGIT;  Surgeon: Leanora Cover, MD;  Location: [place];  Service: Orthopedics;  Laterality: Right;  . APPLICATION OF WOUND VAC Left 01/06/2018   Procedure: APPLICATION OF WOUND VAC;  Surgeon: Samara Deist, DPM;  Location: [facility];  Service: Podiatry;  Laterality: Left;  . BALLOON DILATION  05/05/2011   Procedure: BALLOON DILATION;  Surgeon: Rogene Houston, MD;  [facility];  Service: Endoscopy;  Laterality: N/A;  . CARDIAC CATHETERIZATION     with stent 2004  CYPHER 2.75 x 56mm coronary stent in the mid left anterior descending stenotic lesion    Last stress test showed EF of 775  .  CARDIOVERSION    . CATARACT EXTRACTION Left 2002  . CATARACT EXTRACTION W/PHACO Right 05/14/2013   Procedure: CATARACT EXTRACTION PHACO AND INTRAOCULAR LENS PLACEMENT RIGHT EYE;  Surgeon: Tonny Branch, MD;  Location: [facility];  Service: Ophthalmology;  Laterality: Right;  CDE:  20.45  . CERVICAL DISC SURGERY    . CHOLECYSTECTOMY    . CORONARY ANGIOPLASTY     1 stent  . ESOPHAGEAL DILATION  N/A 04/25/2015   Procedure: ESOPHAGEAL DILATION;  Surgeon: Rogene Houston, MD;  [facility];  Service: Endoscopy;  Laterality: N/A;  . ESOPHAGOGASTRODUODENOSCOPY N/A 04/25/2015   Procedure: ESOPHAGOGASTRODUODENOSCOPY (EGD);  Surgeon: Rogene Houston, MD;  [facility];  Service: Endoscopy;  Laterality: N/A;  1225  . IRRIGATION AND DEBRIDEMENT FOOT Left 01/06/2018   Procedure: IRRIGATION AND DEBRIDEMENT FOOT;  Surgeon: Samara Deist, DPM;  Location: [facility];  Service: Podiatry;  Laterality: Left;  Marland Kitchen MALONEY DILATION  05/05/2011   Procedure: MALONEY DILATION;  Surgeon: Rogene Houston, MD;  [facility];  Service: Endoscopy;  Laterality: N/A;  . right knee arthroscopy  2010  . SAVORY DILATION  05/05/2011   Procedure: SAVORY DILATION;  Surgeon: Rogene Houston, MD;  [facility];  Service: Endoscopy;  Laterality: N/A;  . SHOULDER SURGERY Right    torn rotator cuff  . TOENAIL EXCISION    . TOTAL ABDOMINAL HYSTERECTOMY W/ BILATERAL SALPINGOOPHORECTOMY      Allergies: Ciprofloxacin; Penicillins; Amlodipine; Cefaclor; Codeine; Lipitor [atorvastatin]; Prednisone; and Sulfa antibiotics  Medications: Prior to Admission medications   Medication Sig Start Date End Date Taking? Authorizing Provider  acetaminophen (TYLENOL) 500 MG tablet Take 500-1,000 mg by mouth every 6 (six) hours as needed for mild pain or moderate pain.    [provider]  albuterol (PROVENTIL HFA;VENTOLIN HFA) 108 (90 Base) MCG/ACT inhaler Inhale 2 puffs into the lungs every 4 (four) hours as needed for wheezing or shortness of breath.    [provider]  albuterol (PROVENTIL) (2.5 MG/3ML) 0.083% nebulizer solution Take 2.5 mg by nebulization every 6 (six) hours as needed for shortness of breath.     [provider]  Ascorbic Acid (VITAMIN C) 1000 MG tablet Take 1,000 mg by mouth daily.    [provider]  calcitRIOL (ROCALTROL) 0.5 MCG capsule Take  0.5 mcg by mouth daily. 01/06/17   [provider]  clonazePAM (KLONOPIN) 1 MG tablet Take 0.5-1.5 mg by mouth 4 (four) times daily. Takes 0.5mg  three times a day and takes 1.5mg  at bedtime     [provider]  Fluticasone-Salmeterol (ADVAIR) 500-50 MCG/DOSE AEPB Inhale 1 puff into the lungs 2 (two) times daily.    [provider]  furosemide (LASIX) 40 MG tablet Take 40 mg by mouth daily.    [provider]  isosorbide mononitrate (IMDUR) 60 MG 24 hr tablet TAKE ONE TABLET (60MG  TOTAL) BY MOUTH TWO TIMES DAILY 12/29/17   Minna Merritts, MD  levothyroxine (SYNTHROID, LEVOTHROID) 112 MCG tablet Take 112 mcg by mouth daily. 05/15/18   [provider]  nitroGLYCERIN (NITROLINGUAL) 0.4 MG/SPRAY spray Place 1 spray under the tongue every 5 (five) minutes x 3 doses as needed for chest pain. 07/27/17   Minna Merritts, MD  omega-3 acid ethyl esters (LOVAZA) 1 G capsule Take 2 g by mouth 2 (two) times daily.      [provider]  vitamin B-12 (CYANOCOBALAMIN) 1000 MCG tablet Take 1,000 mcg by mouth 2 (two)  times daily.    [provider]     Family History  Problem Relation Age of Onset  . Diabetes Mother   . Heart failure Mother   . Stroke Mother   . Heart attack Father   . Pancreatic cancer Sister   . Heart disease Sister   . Heart disease Brother   . Hypertension Sister   . Colon cancer Neg Hx     Social History   Socioeconomic History  . Marital status: Widowed    Spouse name: Not on file  . Number of children: 1  . Years of education: Not on file  . Highest education level: Not on file  Occupational History    Employer: RETIRED  Social Needs  . Financial resource strain: Not on file  . Food insecurity:    Worry: Not on file    Inability: Not on file  . Transportation needs:    Medical: Not on file    Non-medical: Not on file  Tobacco Use  . Smoking status: Current Every Day Smoker    Packs/day: 1.00    Years:  56.00    Pack years: 56.00    Types: Cigarettes  . Smokeless tobacco: Never Used  Substance and Sexual Activity  . Alcohol use: Yes    Alcohol/week: 1.0 standard drinks    Types: 1 Cans of beer per week  . Drug use: No  . Sexual activity: Never    Comment: one pack a day  Lifestyle  . Physical activity:    Days per week: Not on file    Minutes per session: Not on file  . Stress: Not on file  Relationships  . Social connections:    Talks on phone: Not on file    Gets together: Not on file    Attends religious service: Not on file    Active member of club or organization: Not on file    Attends meetings of clubs or organizations: Not on file    Relationship status: Not on file  Other Topics Concern  . Not on file  Social History Narrative   [place].  She does not routinely exercise.   Has a walker to ambulate     Review of Systems: A 12 point ROS discussed and pertinent positives are indicated in the HPI above.  All other systems are negative.  Review of Systems  Constitutional: Negative for chills and fever.  Respiratory: Negative for shortness of breath and wheezing.   Cardiovascular: Negative for chest pain and palpitations.  Gastrointestinal:       Negative for bowel incontinence.  Genitourinary: Negative for dysuria and frequency.       Negative for bladder incontinence.  Neurological: Negative for numbness.  Psychiatric/Behavioral: Negative for behavioral problems and confusion.    Vital Signs: There were no vitals taken for this visit.  Physical Exam  Constitutional: She is oriented to person, place, and time. She appears well-developed and well-nourished. No distress.  Pulmonary/Chest: Effort normal. No respiratory distress.  Musculoskeletal:  Moderate-severe midline back pain at level of buttocks.  Neurological: She is alert and oriented to person, place, and time.  Skin: Skin is warm and dry.  Psychiatric: She  has a normal mood and affect. Her behavior is normal. Judgment and thought content normal.     Imaging: Dg Chest 2 View  Result Date: 05/02/2018 CLINICAL DATA:  Three 6 week history of productive cough. History of bronchitis,  COPD, coronary artery disease with stent placement, current smoker. EXAM: CHEST - 2 VIEW COMPARISON:  Chest x-ray of March 28, 2018 FINDINGS: The lungs are well-expanded. There is no focal infiltrate. There is no pleural effusion. The interstitial markings are coarse though stable. The heart and pulmonary vascularity are normal. There is calcification in the wall of the aortic arch. The mediastinum is normal in width. There is mild anterior wedge compression of the body of T12 which appears new since the March 28, 2018 study. There is mild stable compression of the body of L1. IMPRESSION: COPD.  No alveolar pneumonia nor pulmonary edema. Mild anterior compression of the body of T12 new since the study of March 28, 2018. Thoracic aortic atherosclerosis. Electronically Signed   By: David  Martinique M.D.   On: 05/02/2018 14:59   Dg Lumbar Spine Complete  Result Date: 05/02/2018 CLINICAL DATA:  [place] 2 weeks ago and is complaining of low back and right hip pain. EXAM: LUMBAR SPINE - COMPLETE 4+ VIEW COMPARISON:  Lumbar spine series dated Nov 25, 2017 and lumbar spine MRI of March 28, 2018 FINDINGS: The lumbar vertebral bodies are diffusely osteopenic. There is further compression of the previously fractured L1 vertebral body with loss of height anteriorly of approximately 60-70% and posteriorly approximately 10%. There is mild stable wedge compression of T12. There is degenerative disc space narrowing at L1-2, L3-4, L4-5, and L5-S1 which appears stable. There is multilevel facet joint hypertrophy in the lower lumbar spine. There is no spondylolisthesis. IMPRESSION: Further compression of the body of L1 such that the loss of height anteriorly now is approximately 70% and  posteriorly approximately 10%. Stable multilevel degenerative disc disease and facet joint hypertrophy. Electronically Signed   By: David  Martinique M.D.   On: 05/02/2018 14:57   Mr Lumbar Spine Wo Contrast  Result Date: 05/20/2018 CLINICAL DATA:  Severe back pain for a month after fall. EXAM: MRI LUMBAR SPINE WITHOUT CONTRAST TECHNIQUE: Multiplanar, multisequence MR imaging of the lumbar spine was performed. No intravenous contrast was administered. COMPARISON:  Lumbar spine radiographs May 02, 2018 and MRI lumbar spine March 28, 2018. FINDINGS: SEGMENTATION: For the purposes of this report, the last well-formed intervertebral disc is reported as L5-S1. ALIGNMENT: Maintained lumbar lordosis. Grade 1 L1-2 retrolisthesis. Minimal grade 1 L4-5 anterolisthesis. VERTEBRAE:Acute sacral fracture low T1 and bright STIR signal, predominant towards the RIGHT. T12 and L1 compression fractures with approximately 30% height loss, similar to prior examination. Similar moderate L1, moderate to severe L4-5 and L5-S1 disc height loss with diffuse disc desiccation. Multilevel moderate subacute to chronic discogenic endplate changes, mild acute component toward the RIGHT at L4-5. Scattered old Schmorl's nodes. CONUS MEDULLARIS AND CAUDA EQUINA: Conus medullaris terminates at L1 and demonstrates normal morphology and signal characteristics. Central displacement of the cauda equina due to canal stenosis. PARASPINAL AND OTHER SOFT TISSUES: Presacral RIGHT interstitial STIR signal. Moderate symmetric paraspinal muscle atrophy. Two DISC LEVELS: T12-L1: No disc bulge, canal stenosis nor neural foraminal narrowing. L1-2: Retrolisthesis. Annular bulging and mild facet arthropathy without canal stenosis or neural foraminal narrowing. L2-3: Small broad-based disc bulge. Mild facet arthropathy and ligamentum flavum redundancy. Moderate canal stenosis. Mild LEFT neural foraminal narrowing. L3-4: Moderate broad-based disc bulge.  Moderate to severe facet arthropathy and ligamentum flavum redundancy. Subcentimeter bilateral facet synovial cyst within paraspinal soft tissues. Severe canal stenosis. Moderate RIGHT and moderate to severe LEFT neural foraminal narrowing. L4-5: Anterolisthesis. Moderate broad-based disc bulge asymmetric to the RIGHT could affect the exited RIGHT  L4 nerve. Severe RIGHT and moderate LEFT facet arthropathy and ligamentum flavum redundancy. Severe canal stenosis. Moderate to severe RIGHT and moderate LEFT neural foraminal narrowing. L5-S1: Small broad-based disc bulge, moderate facet arthropathy without canal stenosis. Mild neural foraminal narrowing. IMPRESSION: 1. Acute sacral fracture with presacral edema. 2. Stable old mild T12 and L1 compression fractures. Stable grade 1 L1-2 RIGHT retrolisthesis and grade 1 L4-5 anterolisthesis without spondylolysis. 3. 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 Moderate canal stenosis L2-3. 4. Neural foraminal narrowing L2-3 through L5-S1: Moderate to severe L3-4 and L4-5. 5. These results will be called to the ordering clinician or representative by the professional radiologist assistant, and communication documented in [facility] Dashboard. Electronically Signed   By: Elon Alas M.D.   On: 05/20/2018 00:37   Dg Inject Diag/thera/inc Needle/cath/plc Epi/lumb/sac W/img  Result Date: 05/18/2018 CLINICAL DATA:  Low back pain.  RIGHT leg pain.  Tailbone pain. FLUOROSCOPY TIME:  18 seconds corresponding to a Dose Area Product of 75.05 Gy*m2 EXAM: CAUDAL EPIDURAL INJECTION Informed written consent was obtained.  Time-out was performed. An appropriate skin entry site was chosen, cleansed with Betadine, and anesthetized with 1% lidocaine. Utilizing a caudal approach, the skin overlying the sacral hiatus was cleansed and anesthetized. A 22 gauge epidural needle was advanced into the sacral epidural space. Injection of Isovue-M 200  shows a good epidural pattern with spread up to L5-S1. No vascular opacification is seen. 120 mg of Depo-Medrol mixed with 3 ml of normal saline and 3 ml of 1% Lidocaine were instilled. The procedure was well-tolerated, and the patient was discharged thirty minutes following the injection in good condition. IMPRESSION: Technically successful caudal epidural injection #1. Electronically Signed   By: Staci Righter M.D.   On: 05/18/2018 13:35   Dg Inject Diag/thera/inc Needle/cath/plc Epi/lumb/sac W/img  Result Date: 04/25/2018 CLINICAL DATA:  Lumbosacral spondylosis without myelopathy. LEFT leg radicular symptoms. FLUOROSCOPY TIME:  11 seconds corresponding to a Dose Area Product of 23.3 Gy*m2 PROCEDURE: The procedure, risks, benefits, and alternatives were explained to the patient. Questions regarding the procedure were encouraged and answered. The patient understands and consents to the procedure. LUMBAR EPIDURAL INJECTION: An interlaminar approach was performed on LEFT at L5-S1. The overlying skin was cleansed and anesthetized. A 20 gauge epidural needle was advanced using loss-of-resistance technique. DIAGNOSTIC EPIDURAL INJECTION: Injection of Isovue-M 200 shows a good epidural pattern with spread above and below the level of needle placement, primarily on the LEFT; no vascular opacification is seen. THERAPEUTIC EPIDURAL INJECTION: 120.0 mg of Depo-Medrol mixed with 2 mL 1% lidocaine were instilled. The procedure was well-tolerated, and the patient was discharged thirty minutes following the injection in good condition. COMPLICATIONS: None. IMPRESSION: Technically successful epidural injection on the LEFT L5-S1 # 1. Electronically Signed   By: Staci Righter M.D.   On: 04/25/2018 14:13   Dg Hip Unilat With Pelvis 2-3 Views Right  Result Date: 05/12/2018 CLINICAL DATA:  76 year old female status post fall 2 weeks ago with persistent right hip pain. EXAM: DG HIP (WITH OR WITHOUT PELVIS) 2-3V RIGHT  COMPARISON:  Right hip series 02/27/2018. right hip MRI 03/28/2018 FINDINGS: Aortoiliac calcified atherosclerosis. Stable and negative visible lower abdominal and pelvic visceral contours. Bilateral femoral artery calcified atherosclerosis. The femoral heads remain normally located. Hip joint spaces appear stable and within normal limits for age. The proximal left femur and the pelvis appears stable and intact. The proximal right femur appears intact. 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 No acute fracture or dislocation identified about the right hip or pelvis. Electronically Signed   By: Genevie Ann M.D.   On: 05/12/2018 13:55    Labs:  CBC: Recent Labs    01/02/18 1338 01/03/18 0555 01/04/18 0417 01/10/18 0519  03/29/18 1040 04/12/18 1039 04/27/18 1135 05/11/18 1014  WBC 4.9 4.1 5.7 3.7  --   --   --   --   --   HGB 8.5* 7.3* 8.4* 8.8*   < > 12.5 12.7 11.4* 13.2  HCT 25.6* 22.3* 25.0* 26.8*  --   --   --   --   --   PLT 305 265 264 216  --   --   --   --   --    < > = values in this interval not displayed.    COAGS: No results for input(s): INR, APTT in the last 8760 hours.  BMP: Recent Labs    01/07/18 0419 01/08/18 0439 01/09/18 0446 01/10/18 0519  NA 134* 132* 132* 134*  K 5.2* 5.3* 5.5* 5.2*  CL 99 98 98 99  CO2 29 28 26 28   GLUCOSE 132* 124* 144* 127*  BUN 36* 49* 57* 69*  CALCIUM 8.9 9.0 8.8* 8.5*  CREATININE 2.18* 2.50* 2.94* 3.04*  GFRNONAA 21* 17* 14* 14*  GFRAA 24* 20* 17* 16*    LIVER FUNCTION TESTS: Recent Labs    01/02/18 1338  BILITOT 0.8  AST 33  ALT 22  ALKPHOS 82  PROT 6.4*  ALBUMIN 3.3*    TUMOR MARKERS: No results for input(s): AFPTM, CEA, CA199, CHROMGRNA in the last 8760 hours.  Assessment and Plan:  Sacral fracture. Reviewed imaging with patient and husband. Brought to their attention was  patient's sacral fracture. Explained that the best course of management for her fracture at this time is with a procedure called an image-guided sacroplasty. Explained procedure, including risks and benefits. Explained that the indication for this procedure is to stabilize her fracture and decrease her back pain. Patient expresses desire to move forward with procedure and asks for procedure to be done ASAP.  Plan for follow-up with an image-guided sacroplasty tentatively for Tuesday 05/30/2018 at 1130 pending insurance approval. Handout with day-of-procedure information given to patient.  All questions answered and concerns addressed. Patient and husband convey understanding and agree with plan.  Thank you for this interesting consult.  I greatly enjoyed meeting ADRAIN BUTRICK and look forward to participating in their care.  A copy of this report was sent to the requesting provider on this date.  Electronically Signed: Earley Abide, PA-C 05/24/2018, 10:39 AM   I spent a total of 30 Minutes in face to face in clinical consultation, greater than 50% of which was counseling/coordinating care for sacral fracture.

## 2018-05-25 ENCOUNTER — Ambulatory Visit
Admission: RE | Admit: 2018-05-25 | Discharge: 2018-05-25 | Disposition: A | Discharge status: Home / Self Care | Payer: Medicare Other | Source: Ambulatory Visit | Attending: Nephrology | Admitting: Nephrology

## 2018-05-25 NOTE — Progress Notes (Signed)
No show for appointment today at 1030 for HGB and Retacrit.  Unable to contact patient.  Notice sent to office via fax. Will continue attempts to contact patient.

## 2018-05-28 ENCOUNTER — Other Ambulatory Visit: Payer: Self-pay | Admitting: Radiology

## 2018-05-29 ENCOUNTER — Other Ambulatory Visit: Payer: Self-pay | Admitting: Physician Assistant

## 2018-05-30 ENCOUNTER — Encounter (HOSPITAL_COMMUNITY): Payer: Self-pay

## 2018-05-30 ENCOUNTER — Ambulatory Visit (HOSPITAL_COMMUNITY): Admission: RE | Admit: 2018-05-30 | Payer: Medicare Other | Source: Ambulatory Visit

## 2018-05-30 ENCOUNTER — Ambulatory Visit: Payer: Medicare Other | Admitting: Cardiovascular Disease

## 2018-06-01 ENCOUNTER — Emergency Department (HOSPITAL_COMMUNITY)
Admission: EM | Admit: 2018-06-01 | Discharge: 2018-06-01 | Disposition: A | Discharge status: Home / Self Care | Payer: Medicare Other | Attending: Emergency Medicine | Admitting: Emergency Medicine

## 2018-06-01 ENCOUNTER — Encounter (HOSPITAL_COMMUNITY): Payer: Self-pay

## 2018-06-01 DIAGNOSIS — Y939 Activity, unspecified: Secondary | ICD-10-CM | POA: Insufficient documentation

## 2018-06-01 DIAGNOSIS — I129 Hypertensive chronic kidney disease with stage 1 through stage 4 chronic kidney disease, or unspecified chronic kidney disease: Secondary | ICD-10-CM | POA: Diagnosis not present

## 2018-06-01 DIAGNOSIS — F17210 Nicotine dependence, cigarettes, uncomplicated: Secondary | ICD-10-CM | POA: Diagnosis not present

## 2018-06-01 DIAGNOSIS — I2510 Atherosclerotic heart disease of native coronary artery without angina pectoris: Secondary | ICD-10-CM | POA: Diagnosis not present

## 2018-06-01 DIAGNOSIS — E039 Hypothyroidism, unspecified: Secondary | ICD-10-CM | POA: Diagnosis not present

## 2018-06-01 DIAGNOSIS — Y999 Unspecified external cause status: Secondary | ICD-10-CM | POA: Insufficient documentation

## 2018-06-01 DIAGNOSIS — Z79899 Other long term (current) drug therapy: Secondary | ICD-10-CM | POA: Diagnosis not present

## 2018-06-01 DIAGNOSIS — N183 Chronic kidney disease, stage 3 (moderate): Secondary | ICD-10-CM | POA: Insufficient documentation

## 2018-06-01 DIAGNOSIS — J449 Chronic obstructive pulmonary disease, unspecified: Secondary | ICD-10-CM | POA: Diagnosis not present

## 2018-06-01 DIAGNOSIS — Y929 Unspecified place or not applicable: Secondary | ICD-10-CM | POA: Insufficient documentation

## 2018-06-01 DIAGNOSIS — W010XXA Fall on same level from slipping, tripping and stumbling without subsequent striking against object, initial encounter: Secondary | ICD-10-CM | POA: Diagnosis not present

## 2018-06-01 DIAGNOSIS — M549 Dorsalgia, unspecified: Secondary | ICD-10-CM | POA: Diagnosis present

## 2018-06-01 DIAGNOSIS — S3210XA Unspecified fracture of sacrum, initial encounter for closed fracture: Secondary | ICD-10-CM

## 2018-06-01 MED ORDER — HYDROCODONE-ACETAMINOPHEN 5-325 MG PO TABS: 1.0000 | ORAL_TABLET | Freq: Four times a day (QID) | ORAL | 0 refills | Status: DC | PRN

## 2018-06-01 MED ORDER — HYDROMORPHONE HCL 1 MG/ML IJ SOLN
1.0000 mg | Freq: Once | INTRAMUSCULAR | Status: AC
  Administered 2018-06-01: 1 mg via INTRAMUSCULAR
  Filled 2018-06-01: qty 1

## 2018-06-01 NOTE — ED Triage Notes (Signed)
Pt reports fell 2 weeks ago and has a compression fracture in lower back.  Pt says was supposed to have cement put in but says her insurance hasn't approved it yet.  Pt c/o pain in left lower back radiating to left hip and down left leg.

## 2018-06-01 NOTE — Discharge Instructions (Signed)
We have given you some pain medicine today which he tolerated very well however please be aware that most of these pain medications will have similar side effects as morphine and make you itchy.  You have told me that you no longer take the MS Contin which is also known as morphine.  If you should take these 2 medications together it may cause you to stop breathing and die.  Do not take this medication with any other pain medicines.  If you should develop increasing pain, weakness or numbness of your legs return to the emergency department immediately.
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
## 2018-06-02 MED FILL — Hydrocodone-Acetaminophen Tab 5-325 MG: ORAL | Qty: 6 | Status: AC

## 2018-06-05 ENCOUNTER — Inpatient Hospital Stay: Admission: RE | Admit: 2018-06-05 | Payer: Medicare Other | Source: Ambulatory Visit

## 2018-06-08 ENCOUNTER — Ambulatory Visit
Admission: RE | Admit: 2018-06-08 | Discharge: 2018-06-08 | Disposition: A | Discharge status: Home / Self Care | Payer: Medicare Other | Source: Ambulatory Visit | Attending: Nephrology | Admitting: Nephrology

## 2018-06-08 DIAGNOSIS — D631 Anemia in chronic kidney disease: Secondary | ICD-10-CM | POA: Diagnosis present

## 2018-06-08 DIAGNOSIS — N184 Chronic kidney disease, stage 4 (severe): Secondary | ICD-10-CM | POA: Insufficient documentation

## 2018-06-08 LAB — HEMOGLOBIN: Hemoglobin: 12.6 g/dL (ref 12.0–15.0)

## 2018-06-08 LAB — IRON AND TIBC
Iron: 104 ug/dL (ref 28–170)
Saturation Ratios: 42 % — ABNORMAL HIGH (ref 10.4–31.8)
TIBC: 249 ug/dL — AB (ref 250–450)
UIBC: 145 ug/dL

## 2018-06-08 LAB — FERRITIN: Ferritin: 517 ng/mL — ABNORMAL HIGH (ref 11–307)

## 2018-06-08 NOTE — Progress Notes (Signed)
Patient's hemoglobin above range. No injection given. Patient is to return in 2 weeks

## 2018-06-09 LAB — TRANSFERRIN: Transferrin: 178 mg/dL — ABNORMAL LOW (ref 192–382)

## 2018-06-12 ENCOUNTER — Other Ambulatory Visit: Payer: Self-pay | Admitting: Physician Assistant

## 2018-06-13 ENCOUNTER — Encounter (HOSPITAL_COMMUNITY): Payer: Self-pay

## 2018-06-13 ENCOUNTER — Other Ambulatory Visit: Payer: Self-pay

## 2018-06-13 ENCOUNTER — Other Ambulatory Visit (HOSPITAL_COMMUNITY): Payer: Self-pay | Admitting: Interventional Radiology

## 2018-06-13 ENCOUNTER — Ambulatory Visit (HOSPITAL_COMMUNITY)
Admission: RE | Admit: 2018-06-13 | Discharge: 2018-06-13 | Disposition: A | Discharge status: Home / Self Care | Payer: Medicare Other | Source: Ambulatory Visit | Attending: Interventional Radiology | Admitting: Interventional Radiology

## 2018-06-13 DIAGNOSIS — J449 Chronic obstructive pulmonary disease, unspecified: Secondary | ICD-10-CM | POA: Insufficient documentation

## 2018-06-13 DIAGNOSIS — I2510 Atherosclerotic heart disease of native coronary artery without angina pectoris: Secondary | ICD-10-CM | POA: Diagnosis not present

## 2018-06-13 DIAGNOSIS — Z955 Presence of coronary angioplasty implant and graft: Secondary | ICD-10-CM | POA: Insufficient documentation

## 2018-06-13 DIAGNOSIS — Z885 Allergy status to narcotic agent status: Secondary | ICD-10-CM | POA: Insufficient documentation

## 2018-06-13 DIAGNOSIS — E781 Pure hyperglyceridemia: Secondary | ICD-10-CM | POA: Diagnosis not present

## 2018-06-13 DIAGNOSIS — Z882 Allergy status to sulfonamides status: Secondary | ICD-10-CM | POA: Diagnosis not present

## 2018-06-13 DIAGNOSIS — F17210 Nicotine dependence, cigarettes, uncomplicated: Secondary | ICD-10-CM | POA: Diagnosis not present

## 2018-06-13 DIAGNOSIS — Z880 Allergy status to penicillin: Secondary | ICD-10-CM | POA: Insufficient documentation

## 2018-06-13 DIAGNOSIS — E039 Hypothyroidism, unspecified: Secondary | ICD-10-CM | POA: Insufficient documentation

## 2018-06-13 DIAGNOSIS — M48061 Spinal stenosis, lumbar region without neurogenic claudication: Secondary | ICD-10-CM | POA: Insufficient documentation

## 2018-06-13 DIAGNOSIS — N183 Chronic kidney disease, stage 3 (moderate): Secondary | ICD-10-CM | POA: Diagnosis not present

## 2018-06-13 DIAGNOSIS — K219 Gastro-esophageal reflux disease without esophagitis: Secondary | ICD-10-CM | POA: Diagnosis not present

## 2018-06-13 DIAGNOSIS — M8448XA Pathological fracture, other site, initial encounter for fracture: Secondary | ICD-10-CM | POA: Diagnosis not present

## 2018-06-13 DIAGNOSIS — Z8249 Family history of ischemic heart disease and other diseases of the circulatory system: Secondary | ICD-10-CM | POA: Diagnosis not present

## 2018-06-13 DIAGNOSIS — S3210XA Unspecified fracture of sacrum, initial encounter for closed fracture: Secondary | ICD-10-CM

## 2018-06-13 DIAGNOSIS — I739 Peripheral vascular disease, unspecified: Secondary | ICD-10-CM | POA: Insufficient documentation

## 2018-06-13 DIAGNOSIS — Z7951 Long term (current) use of inhaled steroids: Secondary | ICD-10-CM | POA: Insufficient documentation

## 2018-06-13 DIAGNOSIS — I129 Hypertensive chronic kidney disease with stage 1 through stage 4 chronic kidney disease, or unspecified chronic kidney disease: Secondary | ICD-10-CM | POA: Insufficient documentation

## 2018-06-13 HISTORY — PX: IR SACROPLASTY BILATERAL: IMG5561

## 2018-06-13 LAB — PROTIME-INR
INR: 0.83
Prothrombin Time: 11.4 seconds (ref 11.4–15.2)

## 2018-06-13 LAB — BASIC METABOLIC PANEL
Anion gap: 10 (ref 5–15)
BUN: 18 mg/dL (ref 8–23)
CO2: 31 mmol/L (ref 22–32)
Calcium: 9.5 mg/dL (ref 8.9–10.3)
Chloride: 103 mmol/L (ref 98–111)
Creatinine, Ser: 1.69 mg/dL — ABNORMAL HIGH (ref 0.44–1.00)
GFR calc Af Amer: 33 mL/min — ABNORMAL LOW (ref >60)
GFR calc non Af Amer: 29 mL/min — ABNORMAL LOW (ref >60)
GLUCOSE: 92 mg/dL (ref 70–99)
Potassium: 3.6 mmol/L (ref 3.5–5.1)
Sodium: 144 mmol/L (ref 135–145)

## 2018-06-13 LAB — CBC
HCT: 36.6 % (ref 36.0–46.0)
Hemoglobin: 11.3 g/dL — ABNORMAL LOW (ref 12.0–15.0)
MCH: 33.3 pg (ref 26.0–34.0)
MCHC: 30.9 g/dL (ref 30.0–36.0)
MCV: 108 fL — ABNORMAL HIGH (ref 80.0–100.0)
Platelets: 204 10*3/uL (ref 150–400)
RBC: 3.39 MIL/uL — ABNORMAL LOW (ref 3.87–5.11)
RDW: 14.4 % (ref 11.5–15.5)
WBC: 5.4 10*3/uL (ref 4.0–10.5)
nRBC: 0 % (ref 0.0–0.2)

## 2018-06-13 MED ORDER — MIDAZOLAM HCL 2 MG/2ML IJ SOLN
INTRAMUSCULAR | Status: AC | PRN
  Administered 2018-06-13: 1 mg via INTRAVENOUS

## 2018-06-13 MED ORDER — FENTANYL CITRATE (PF) 100 MCG/2ML IJ SOLN
INTRAMUSCULAR | Status: AC
  Filled 2018-06-13: qty 2

## 2018-06-13 MED ORDER — MIDAZOLAM HCL 2 MG/2ML IJ SOLN
INTRAMUSCULAR | Status: AC
  Filled 2018-06-13: qty 2

## 2018-06-13 MED ORDER — VANCOMYCIN HCL IN DEXTROSE 1-5 GM/200ML-% IV SOLN
1000.0000 mg | INTRAVENOUS | Status: AC
  Administered 2018-06-13: 1000 mg via INTRAVENOUS

## 2018-06-13 MED ORDER — VANCOMYCIN HCL IN DEXTROSE 1-5 GM/200ML-% IV SOLN
INTRAVENOUS | Status: AC
  Administered 2018-06-13: 1000 mg via INTRAVENOUS
  Filled 2018-06-13: qty 200

## 2018-06-13 MED ORDER — FENTANYL CITRATE (PF) 100 MCG/2ML IJ SOLN
INTRAMUSCULAR | Status: AC | PRN
  Administered 2018-06-13: 25 ug via INTRAVENOUS

## 2018-06-13 MED ORDER — GELATIN ABSORBABLE 12-7 MM EX MISC
CUTANEOUS | Status: AC
  Filled 2018-06-13: qty 1

## 2018-06-13 MED ORDER — TOBRAMYCIN SULFATE 1.2 G IJ SOLR
INTRAMUSCULAR | Status: AC
  Filled 2018-06-13: qty 1.2

## 2018-06-13 MED ORDER — BUPIVACAINE HCL (PF) 0.5 % IJ SOLN
INTRAMUSCULAR | Status: AC
  Filled 2018-06-13: qty 30

## 2018-06-13 MED ORDER — SODIUM CHLORIDE 0.9 % IV SOLN: INTRAVENOUS | Status: AC

## 2018-06-13 MED ORDER — IOPAMIDOL (ISOVUE-300) INJECTION 61%
INTRAVENOUS | Status: AC
  Filled 2018-06-13: qty 50

## 2018-06-13 MED ORDER — SODIUM CHLORIDE 0.9 % IV SOLN
INTRAVENOUS | Status: DC
  Administered 2018-06-13: 11:00:00 via INTRAVENOUS

## 2018-06-13 NOTE — Procedures (Signed)
S/P sacroplasty S1  Bilateral approach

## 2018-06-13 NOTE — H&P (Signed)
Chief Complaint: Patient was seen in consultation today for Sacroplasty  Referring Physician(s): Lenon Oms NP  Supervising Physician: Luanne Bras  Patient Status: [facility] - Out-pt  History of Present Illness: [name] is a 77 y.o. female   Seen in consultation 05/24/18 A few weeks ago, patient fell on her buttocks and experienced immediate lower back pain. She went to her PCP for evaluation. Her back pain has continued to worsen since. Pain meds and exercises are No relief  MRI lumbar spine 05/19/2018: 1. Acute sacral fracture with presacral edema. 2. Stable old mild T12 and L1 compression fractures. Stable grade 1 L1-2 RIGHT retrolisthesis and grade 1 L4-5 anterolisthesis without spondylolysis. 3. 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 Moderate canal stenosis L2-3. 4. Neural foraminal narrowing L2-3 through L5-S1: Moderate to severe L3-4 and L4-5.  Scheduled now for Sacroplasty after consultation with Dr Estanislado Pandy     Past Medical History:  Diagnosis Date  . Anemia   . CAD (coronary artery disease)    a. 10/2002 PCI: LAD 81m (2.75x23 Cypher DES); b. 09/2004 Cath: LM nl, LAD 40p, patent stent, D1 30p, LCX 20p, diff dzs throughout, OM1 50p, OM2 60p, OM3 40p, RCA 40p-->Med Rx; c. 10/2012 MV: No ischemia/infarct.  . Carotid artery disease ([place])   . Cervical disc disease   . CKD (chronic kidney disease), stage III ([place])   . COPD (chronic obstructive pulmonary disease) ([place])   . Diastolic dysfunction    a. 03/2017 Echo: Ef 60-65%, Gr2 DD, mild MR, mildly dil LA/RA, mildly to mod dil RV w/ nl fxn, PASP 22mmHg.  Marland Kitchen Dysphagia   . GERD (gastroesophageal reflux disease)   . History of kidney stones   . Hypertension    2002  . Hypertriglyceridemia   . Hypothyroidism   . PAD (peripheral artery disease) ([place])    a. 10/2017 ABI's R = 0.78, L = 0.88. Duplex showed borderline R CFA and SFA dzs->med rx.  Marland Kitchen PAF  (paroxysmal atrial fibrillation) ([place])    a. Amio/Pradaxa (CHA2DS2VASc = 6).  Marland Kitchen PONV (postoperative nausea and vomiting)   . Right renal artery stenosis ([place])    a. 09/2017 Renal Artery Duplex: no significant LRA stenosis. RRA <60%.    Past Surgical History:  Procedure Laterality Date  . ABDOMINAL HYSTERECTOMY    . AMPUTATION Right 04/12/2014   Procedure: Revision AMPUTATION Right Long DIGIT;  Surgeon: Leanora Cover, MD;  Location: [facility];  Service: Orthopedics;  Laterality: Right;  . APPLICATION OF WOUND VAC Left 01/06/2018   Procedure: APPLICATION OF WOUND VAC;  Surgeon: Samara Deist, DPM;  Location: [facility];  Service: Podiatry;  Laterality: Left;  . BALLOON DILATION  05/05/2011   Procedure: BALLOON DILATION;  Surgeon: Rogene Houston, MD;  [facility];  Service: Endoscopy;  Laterality: N/A;  . CARDIAC CATHETERIZATION     with stent 2004  CYPHER 2.75 x 52mm coronary stent in the mid left anterior descending stenotic lesion    Last stress test showed EF of 775  . CARDIOVERSION    . CATARACT EXTRACTION Left 2002  . CATARACT EXTRACTION W/PHACO Right 05/14/2013   Procedure: CATARACT EXTRACTION PHACO AND INTRAOCULAR LENS PLACEMENT RIGHT EYE;  Surgeon: Tonny Branch, MD;  Location: [facility];  Service: Ophthalmology;  Laterality: Right;  CDE:  20.45  . CERVICAL DISC SURGERY    . CHOLECYSTECTOMY    . CORONARY ANGIOPLASTY     1 stent  . ESOPHAGEAL  DILATION N/A 04/25/2015   Procedure: ESOPHAGEAL DILATION;  Surgeon: Rogene Houston, MD;  [facility];  Service: Endoscopy;  Laterality: N/A;  . ESOPHAGOGASTRODUODENOSCOPY N/A 04/25/2015   Procedure: ESOPHAGOGASTRODUODENOSCOPY (EGD);  Surgeon: Rogene Houston, MD;  [facility];  Service: Endoscopy;  Laterality: N/A;  1225  . IRRIGATION AND DEBRIDEMENT FOOT Left 01/06/2018   Procedure: IRRIGATION AND DEBRIDEMENT FOOT;  Surgeon: Samara Deist, DPM;  Location: [facility];  Service: Podiatry;  Laterality: Left;  Marland Kitchen MALONEY  DILATION  05/05/2011   Procedure: MALONEY DILATION;  Surgeon: Rogene Houston, MD;  [facility];  Service: Endoscopy;  Laterality: N/A;  . right knee arthroscopy  2010  . SAVORY DILATION  05/05/2011   Procedure: SAVORY DILATION;  Surgeon: Rogene Houston, MD;  [facility];  Service: Endoscopy;  Laterality: N/A;  . SHOULDER SURGERY Right    torn rotator cuff  . TOENAIL EXCISION    . TOTAL ABDOMINAL HYSTERECTOMY W/ BILATERAL SALPINGOOPHORECTOMY      Allergies: Ciprofloxacin; Penicillins; Amlodipine; Morphine and related; Cefaclor; Codeine; Lipitor [atorvastatin]; Prednisone; and Sulfa antibiotics  Medications: Prior to Admission medications   Medication Sig Start Date End Date Taking? Authorizing Provider  acetaminophen (TYLENOL) 500 MG tablet Take 1,000 mg by mouth 2 (two) times daily. Lunch and bedtime   Yes [provider]  albuterol (PROVENTIL HFA;VENTOLIN HFA) 108 (90 Base) MCG/ACT inhaler Inhale 2 puffs into the lungs every 4 (four) hours as needed for wheezing or shortness of breath.   Yes [provider]  albuterol (PROVENTIL) (2.5 MG/3ML) 0.083% nebulizer solution Take 2.5 mg by nebulization every 6 (six) hours as needed for shortness of breath.    Yes [provider]  Ascorbic Acid (VITAMIN C) 1000 MG tablet Take 1,000 mg by mouth daily.   Yes [provider]  calcitRIOL (ROCALTROL) 0.5 MCG capsule Take 0.5 mcg by mouth every other day.  01/06/17  Yes [provider]  cetirizine (ZYRTEC) 10 MG tablet Take 10 mg by mouth at bedtime.   Yes [provider]  clonazePAM (KLONOPIN) 1 MG tablet Take 2-3 mg by mouth See admin instructions. Takes 1 mg once daily at lunch and takes 3 mg at bedtime   Yes [provider]  Fluticasone-Salmeterol (ADVAIR) 500-50 MCG/DOSE AEPB Inhale 1 puff into the lungs 2 (two) times daily.   Yes [provider]  furosemide (LASIX) 40 MG tablet Take 40 mg by mouth daily.    Yes [provider]  isosorbide mononitrate (IMDUR) 60 MG 24 hr tablet TAKE ONE TABLET (60MG  TOTAL) BY MOUTH TWO TIMES DAILY Patient taking differently: Take 60 mg by mouth 2 (two) times daily.  12/29/17  Yes Gollan, Kathlene November, MD  levothyroxine (SYNTHROID, LEVOTHROID) 100 MCG tablet Take 112 mcg by mouth every other day.  05/15/18  Yes [provider]  omega-3 acid ethyl esters (LOVAZA) 1 G capsule Take 2 g by mouth 2 (two) times daily.     Yes [provider]  vitamin B-12 (CYANOCOBALAMIN) 1000 MCG tablet Take 1,000 mcg by mouth 2 (two) times daily.   Yes [provider]  HYDROcodone-acetaminophen (NORCO/VICODIN) 5-325 MG tablet Take 1 tablet by mouth every 6 (six) hours as needed for moderate pain. 06/01/18   Noemi Chapel, MD  nitroGLYCERIN (NITROLINGUAL) 0.4 MG/SPRAY spray Place 1 spray under the tongue every 5 (five) minutes x 3 doses as needed for chest pain. 07/27/17   Minna Merritts, MD  Family History  Problem Relation Age of Onset  . Diabetes Mother   . Heart failure Mother   . Stroke Mother   . Heart attack Father   . Pancreatic cancer Sister   . Heart disease Sister   . Heart disease Brother   . Hypertension Sister   . Colon cancer Neg Hx     Social History   Socioeconomic History  . Marital status: Widowed    Spouse name: Not on file  . Number of children: 1  . Years of education: Not on file  . Highest education level: Not on file  Occupational History    Employer: RETIRED  Social Needs  . Financial resource strain: Not on file  . Food insecurity:    Worry: Not on file    Inability: Not on file  . Transportation needs:    Medical: Not on file    Non-medical: Not on file  Tobacco Use  . Smoking status: Current Every Day Smoker    Packs/day: 1.00    Years: 56.00    Pack years: 56.00    Types: Cigarettes  . Smokeless tobacco: Never Used  Substance and Sexual Activity  . Alcohol use: Yes    Alcohol/week: 1.0  standard drinks    Types: 1 Cans of beer per week  . Drug use: No  . Sexual activity: Never    Comment: one pack a day  Lifestyle  . Physical activity:    Days per week: Not on file    Minutes per session: Not on file  . Stress: Not on file  Relationships  . Social connections:    Talks on phone: Not on file    Gets together: Not on file    Attends religious service: Not on file    Active member of club or organization: Not on file    Attends meetings of clubs or organizations: Not on file    Relationship status: Not on file  Other Topics Concern  . Not on file  Social History Narrative   [place].  She does not routinely exercise.   Has a walker to ambulate    Review of Systems: A 12 point ROS discussed and pertinent positives are indicated in the HPI above.  All other systems are negative.  Review of Systems  Constitutional: Positive for activity change. Negative for fatigue, fever and unexpected weight change.  Respiratory: Positive for cough.   Cardiovascular: Negative for chest pain.  Gastrointestinal: Negative for abdominal pain.  Musculoskeletal: Positive for back pain and gait problem.  Neurological: Positive for weakness.  Psychiatric/Behavioral: Negative for behavioral problems and confusion.    Vital Signs: BP (!) 174/58   Pulse 81   Temp (!) 97.2 F (36.2 C) (Tympanic)   Ht 5\' 6"  (1.676 m)   Wt 174 lb (78.9 kg)   SpO2 94%   BMI 28.08 kg/m   Physical Exam  Constitutional: She is oriented to person, place, and time.  Cardiovascular: Normal rate, regular rhythm and normal heart sounds.  Pulmonary/Chest: Effort normal. She has wheezes.  Abdominal: Soft. Bowel sounds are normal.  Musculoskeletal: Normal range of motion.  Neurological: She is alert and oriented to person, place, and time.  Skin: Skin is warm and dry.  Psychiatric: She has a normal mood and affect. Her behavior is normal. Judgment and thought  content normal.  Vitals reviewed.   Imaging: Mr Lumbar Spine Wo Contrast  Result Date: 05/20/2018  CLINICAL DATA:  Severe back pain for a month after fall. EXAM: MRI LUMBAR SPINE WITHOUT CONTRAST TECHNIQUE: Multiplanar, multisequence MR imaging of the lumbar spine was performed. No intravenous contrast was administered. COMPARISON:  Lumbar spine radiographs May 02, 2018 and MRI lumbar spine March 28, 2018. FINDINGS: SEGMENTATION: For the purposes of this report, the last well-formed intervertebral disc is reported as L5-S1. ALIGNMENT: Maintained lumbar lordosis. Grade 1 L1-2 retrolisthesis. Minimal grade 1 L4-5 anterolisthesis. VERTEBRAE:Acute sacral fracture low T1 and bright STIR signal, predominant towards the RIGHT. T12 and L1 compression fractures with approximately 30% height loss, similar to prior examination. Similar moderate L1, moderate to severe L4-5 and L5-S1 disc height loss with diffuse disc desiccation. Multilevel moderate subacute to chronic discogenic endplate changes, mild acute component toward the RIGHT at L4-5. Scattered old Schmorl's nodes. CONUS MEDULLARIS AND CAUDA EQUINA: Conus medullaris terminates at L1 and demonstrates normal morphology and signal characteristics. Central displacement of the cauda equina due to canal stenosis. PARASPINAL AND OTHER SOFT TISSUES: Presacral RIGHT interstitial STIR signal. Moderate symmetric paraspinal muscle atrophy. Two DISC LEVELS: T12-L1: No disc bulge, canal stenosis nor neural foraminal narrowing. L1-2: Retrolisthesis. Annular bulging and mild facet arthropathy without canal stenosis or neural foraminal narrowing. L2-3: Small broad-based disc bulge. Mild facet arthropathy and ligamentum flavum redundancy. Moderate canal stenosis. Mild LEFT neural foraminal narrowing. L3-4: Moderate broad-based disc bulge. Moderate to severe facet arthropathy and ligamentum flavum redundancy. Subcentimeter bilateral facet synovial cyst within paraspinal  soft tissues. Severe canal stenosis. Moderate RIGHT and moderate to severe LEFT neural foraminal narrowing. L4-5: Anterolisthesis. Moderate broad-based disc bulge asymmetric to the RIGHT could affect the exited RIGHT L4 nerve. Severe RIGHT and moderate LEFT facet arthropathy and ligamentum flavum redundancy. Severe canal stenosis. Moderate to severe RIGHT and moderate LEFT neural foraminal narrowing. L5-S1: Small broad-based disc bulge, moderate facet arthropathy without canal stenosis. Mild neural foraminal narrowing. IMPRESSION: 1. Acute sacral fracture with presacral edema. 2. Stable old mild T12 and L1 compression fractures. Stable grade 1 L1-2 RIGHT retrolisthesis and grade 1 L4-5 anterolisthesis without spondylolysis. 3. 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 Moderate canal stenosis L2-3. 4. Neural foraminal narrowing L2-3 through L5-S1: Moderate to severe L3-4 and L4-5. 5. These results will be called to the ordering clinician or representative by the professional radiologist assistant, and communication documented in [facility] Dashboard. Electronically Signed   By: Elon Alas M.D.   On: 05/20/2018 00:37   Dg Inject Diag/thera/inc Needle/cath/plc Epi/lumb/sac W/img  Result Date: 05/18/2018 CLINICAL DATA:  Low back pain.  RIGHT leg pain.  Tailbone pain. FLUOROSCOPY TIME:  18 seconds corresponding to a Dose Area Product of 75.05 Gy*m2 EXAM: CAUDAL EPIDURAL INJECTION Informed written consent was obtained.  Time-out was performed. An appropriate skin entry site was chosen, cleansed with Betadine, and anesthetized with 1% lidocaine. Utilizing a caudal approach, the skin overlying the sacral hiatus was cleansed and anesthetized. A 22 gauge epidural needle was advanced into the sacral epidural space. Injection of Isovue-M 200 shows a good epidural pattern with spread up to L5-S1. No vascular opacification is seen. 120 mg of Depo-Medrol mixed with 3 ml of  normal saline and 3 ml of 1% Lidocaine were instilled. The procedure was well-tolerated, and the patient was discharged thirty minutes following the injection in good condition. IMPRESSION: Technically successful caudal epidural injection #1. Electronically Signed   By: Staci Righter M.D.   On: 05/18/2018 13:35    Labs:  CBC: Recent  Labs    01/02/18 1338 01/03/18 0555 01/04/18 0417 01/10/18 0519  04/12/18 1039 04/27/18 1135 05/11/18 1014 06/08/18 1305  WBC 4.9 4.1 5.7 3.7  --   --   --   --   --   HGB 8.5* 7.3* 8.4* 8.8*   < > 12.7 11.4* 13.2 12.6  HCT 25.6* 22.3* 25.0* 26.8*  --   --   --   --   --   PLT 305 265 264 216  --   --   --   --   --    < > = values in this interval not displayed.    COAGS: Recent Labs    06/13/18 1022  INR 0.83    BMP: Recent Labs    01/07/18 0419 01/08/18 0439 01/09/18 0446 01/10/18 0519  NA 134* 132* 132* 134*  K 5.2* 5.3* 5.5* 5.2*  CL 99 98 98 99  CO2 29 28 26 28   GLUCOSE 132* 124* 144* 127*  BUN 36* 49* 57* 69*  CALCIUM 8.9 9.0 8.8* 8.5*  CREATININE 2.18* 2.50* 2.94* 3.04*  GFRNONAA 21* 17* 14* 14*  GFRAA 24* 20* 17* 16*    LIVER FUNCTION TESTS: Recent Labs    01/02/18 1338  BILITOT 0.8  AST 33  ALT 22  ALKPHOS 82  PROT 6.4*  ALBUMIN 3.3*    TUMOR MARKERS: No results for input(s): AFPTM, CEA, CA199, CHROMGRNA in the last 8760 hours.  Assessment and Plan:  Low back pain for weeks Sacral fracture per imaging Seen in consultation with Dr Estanislado Pandy for Sacroplasty  Scheduled for same today Risks and benefits of Sacroplasty were discussed with the patient including, but not limited to education regarding the natural healing process of compression fractures without intervention, bleeding, infection, cement migration which may cause spinal cord damage, paralysis, pulmonary embolism or even death.  This interventional procedure involves the use of X-rays and because of the nature of the planned procedure, it is possible  that we will have prolonged use of X-ray fluoroscopy.  Potential radiation risks to you include (but are not limited to) the following: - A slightly elevated risk for cancer  several years later in life. This risk is typically less than 0.5% percent. This risk is low in comparison to the normal incidence of human cancer, which is 33% for women and 50% for men according to the [employer]. - Radiation induced injury can include skin redness, resembling a rash, tissue breakdown / ulcers and hair loss (which can be temporary or permanent).   The likelihood of either of these occurring depends on the difficulty of the procedure and whether you are sensitive to radiation due to previous procedures, disease, or genetic conditions.   IF your procedure requires a prolonged use of radiation, you will be notified and given written instructions for further action.  It is your responsibility to monitor the irradiated area for the 2 weeks following the procedure and to notify your physician if you are concerned that you have suffered a radiation induced injury.    All of the patient's questions were answered, patient is agreeable to proceed.  Consent signed and in chart.  Thank you for this interesting consult.  I greatly enjoyed meeting CALAIS SVEHLA and look forward to participating in their care.  A copy of this report was sent to the requesting provider on this date.  Electronically Signed: Lavonia Drafts, PA-C 06/13/2018, 11:14 AM   I spent a total of  30 Minutes   in face to face in clinical consultation, greater than 50% of which was counseling/coordinating care for sacroplasty

## 2018-06-13 NOTE — Discharge Instructions (Signed)
1. No stooping,bending or lifting more than 10 lbs for 2 weeks. 2.Use walker to ambulate. 3.No driving for 2 weeks. 4.RTC PRN 2 weeks   Balloon Kyphoplasty, Care After Refer to this sheet in the next few weeks. These instructions provide you with information about caring for yourself after your procedure. Your health care provider may also give you more specific instructions. Your treatment has been planned according to current medical practices, but problems sometimes occur. Call your health care provider if you have any problems or questions after your procedure. What can I expect after the procedure? After your procedure, it is common to have back pain. Follow these instructions at home: Incision care  Follow instructions from your health care provider about how to take care of your incisions. Make sure you: ? Remove your dressing as told by your health care provider. Remove in 24-48 hours. Then you may shower. ? Leave stitches (sutures), skin glue, or adhesive strips in place. These skin closures may need to be in place for 2 weeks or longer. If adhesive strip edges start to loosen and curl up, you may trim the loose edges. Do not remove adhesive strips completely unless your health care provider tells you to do that.  Check your incision area every day for signs of infection. Watch for: ? Redness, swelling, or pain. ? Fluid, blood, or pus.  Keep your dressing dry until your health care provider says that it can be removed. Activity   Rest your back and avoid intense physical activity for as long as told by your health care provider.  Return to your normal activities as told by your health care provider. Ask your health care provider what activities are safe for you.  Do not lift anything that is heavier than 10 lb (4.5 kg). This is about the weight of a gallon of milk.You may need to avoid heavy lifting for several weeks. General instructions  Take over-the-counter and  prescription medicines only as told by your health care provider.  If directed, apply ice to the painful area: ? Put ice in a plastic bag. ? Place a towel between your skin and the bag. ? Leave the ice on for 20 minutes, 2-3 times per day.  Do not use tobacco products, including cigarettes, chewing tobacco, or e-cigarettes. If you need help quitting, ask your health care provider.  Keep all follow-up visits as told by your health care provider. This is important. Contact a health care provider if:  You have a fever.  You have redness, swelling, or pain at the site of your incisions.  You have fluid, blood, or pus coming from your incisions.  You have pain that gets worse or does not get better with medicine.  You develop numbness or weakness in any part of your body. Get help right away if:  You have chest pain.  You have difficulty breathing.  You cannot move your legs.  You cannot control your bladder or bowel movements.  You suddenly become weak or numb on one side of your body.  You become very confused.  You have trouble speaking or understanding, or both. This information is not intended to replace advice given to you by your health care provider. Make sure you discuss any questions you have with your health care provider. Document Released: 03/12/2015 Document Revised: 11/27/2015 Document Reviewed: 10/14/2014 Elsevier Interactive Patient Education  2018 [place].  Moderate Conscious Sedation, Adult, Care After These instructions provide you with information about caring for  yourself after your procedure. Your health care provider may also give you more specific instructions. Your treatment has been planned according to current medical practices, but problems sometimes occur. Call your health care provider if you have any problems or questions after your procedure. What can I expect after the procedure? After your procedure, it is common:  To feel sleepy for  several hours.  To feel clumsy and have poor balance for several hours.  To have poor judgment for several hours.  To vomit if you eat too soon.  Follow these instructions at home: For at least 24 hours after the procedure:   Do not: ? Participate in activities where you could fall or become injured. ? Drive. ? Use heavy machinery. ? Drink alcohol. ? Take sleeping pills or medicines that cause drowsiness. ? Make important decisions or sign legal documents. ? Take care of children on your own.  Rest. Eating and drinking  Follow the diet recommended by your health care provider.  If you vomit: ? Drink water, juice, or soup when you can drink without vomiting. ? Make sure you have little or no nausea before eating solid foods. General instructions  Have a responsible adult stay with you until you are awake and alert.  Take over-the-counter and prescription medicines only as told by your health care provider.  If you smoke, do not smoke without supervision.  Keep all follow-up visits as told by your health care provider. This is important. Contact a health care provider if:  You keep feeling nauseous or you keep vomiting.  You feel light-headed.  You develop a rash.  You have a fever. Get help right away if:  You have trouble breathing. This information is not intended to replace advice given to you by your health care provider. Make sure you discuss any questions you have with your health care provider. Document Released: 04/11/2013 Document Revised: 11/24/2015 Document Reviewed: 10/11/2015 Elsevier Interactive Patient Education  [employer].

## 2018-06-14 ENCOUNTER — Encounter (HOSPITAL_COMMUNITY): Payer: Self-pay | Admitting: Interventional Radiology

## 2018-06-22 ENCOUNTER — Ambulatory Visit
Admission: RE | Admit: 2018-06-22 | Discharge: 2018-06-22 | Disposition: A | Discharge status: Home / Self Care | Payer: Medicare Other | Source: Ambulatory Visit | Attending: Nephrology | Admitting: Nephrology

## 2018-06-22 DIAGNOSIS — N184 Chronic kidney disease, stage 4 (severe): Secondary | ICD-10-CM | POA: Diagnosis not present

## 2018-06-22 DIAGNOSIS — D631 Anemia in chronic kidney disease: Secondary | ICD-10-CM | POA: Diagnosis present

## 2018-06-22 LAB — HEMOGLOBIN: Hemoglobin: 11 g/dL — ABNORMAL LOW (ref 12.0–15.0)

## 2018-06-22 MED ORDER — EPOETIN ALFA-EPBX 40000 UNIT/ML IJ SOLN
30000.0000 [IU] | Freq: Once | INTRAMUSCULAR | Status: AC
  Administered 2018-06-22: 30000 [IU] via SUBCUTANEOUS
  Filled 2018-06-22: qty 1

## 2018-06-24 DIAGNOSIS — S32599A Other specified fracture of unspecified pubis, initial encounter for closed fracture: Secondary | ICD-10-CM

## 2018-06-24 HISTORY — DX: Other specified fracture of unspecified pubis, initial encounter for closed fracture: S32.599A

## 2018-06-25 ENCOUNTER — Other Ambulatory Visit: Payer: Self-pay

## 2018-06-25 ENCOUNTER — Emergency Department (HOSPITAL_COMMUNITY): Payer: Medicare Other

## 2018-06-25 ENCOUNTER — Observation Stay (HOSPITAL_COMMUNITY): Payer: Medicare Other

## 2018-06-25 ENCOUNTER — Observation Stay (HOSPITAL_COMMUNITY)
Admission: EM | Admit: 2018-06-25 | Discharge: 2018-07-04 | Disposition: A | Discharge status: Home / Self Care | Payer: Medicare Other | Attending: Internal Medicine | Admitting: Internal Medicine

## 2018-06-25 ENCOUNTER — Encounter (HOSPITAL_COMMUNITY): Payer: Self-pay

## 2018-06-25 DIAGNOSIS — E039 Hypothyroidism, unspecified: Secondary | ICD-10-CM | POA: Diagnosis not present

## 2018-06-25 DIAGNOSIS — Z8249 Family history of ischemic heart disease and other diseases of the circulatory system: Secondary | ICD-10-CM | POA: Insufficient documentation

## 2018-06-25 DIAGNOSIS — Z9981 Dependence on supplemental oxygen: Secondary | ICD-10-CM | POA: Insufficient documentation

## 2018-06-25 DIAGNOSIS — J9611 Chronic respiratory failure with hypoxia: Secondary | ICD-10-CM

## 2018-06-25 DIAGNOSIS — R0602 Shortness of breath: Secondary | ICD-10-CM

## 2018-06-25 DIAGNOSIS — Z89021 Acquired absence of right finger(s): Secondary | ICD-10-CM | POA: Insufficient documentation

## 2018-06-25 DIAGNOSIS — I5032 Chronic diastolic (congestive) heart failure: Secondary | ICD-10-CM | POA: Diagnosis not present

## 2018-06-25 DIAGNOSIS — I2510 Atherosclerotic heart disease of native coronary artery without angina pectoris: Secondary | ICD-10-CM | POA: Diagnosis present

## 2018-06-25 DIAGNOSIS — N184 Chronic kidney disease, stage 4 (severe): Secondary | ICD-10-CM | POA: Diagnosis not present

## 2018-06-25 DIAGNOSIS — W19XXXA Unspecified fall, initial encounter: Secondary | ICD-10-CM | POA: Diagnosis not present

## 2018-06-25 DIAGNOSIS — Z79899 Other long term (current) drug therapy: Secondary | ICD-10-CM | POA: Insufficient documentation

## 2018-06-25 DIAGNOSIS — G894 Chronic pain syndrome: Secondary | ICD-10-CM | POA: Insufficient documentation

## 2018-06-25 DIAGNOSIS — Z881 Allergy status to other antibiotic agents status: Secondary | ICD-10-CM | POA: Insufficient documentation

## 2018-06-25 DIAGNOSIS — M545 Low back pain: Secondary | ICD-10-CM | POA: Diagnosis not present

## 2018-06-25 DIAGNOSIS — I130 Hypertensive heart and chronic kidney disease with heart failure and stage 1 through stage 4 chronic kidney disease, or unspecified chronic kidney disease: Secondary | ICD-10-CM | POA: Diagnosis not present

## 2018-06-25 DIAGNOSIS — R2681 Unsteadiness on feet: Secondary | ICD-10-CM | POA: Diagnosis not present

## 2018-06-25 DIAGNOSIS — I10 Essential (primary) hypertension: Secondary | ICD-10-CM | POA: Diagnosis present

## 2018-06-25 DIAGNOSIS — I700 Atherosclerosis of aorta: Secondary | ICD-10-CM | POA: Diagnosis not present

## 2018-06-25 DIAGNOSIS — S32592A Other specified fracture of left pubis, initial encounter for closed fracture: Principal | ICD-10-CM

## 2018-06-25 DIAGNOSIS — F17210 Nicotine dependence, cigarettes, uncomplicated: Secondary | ICD-10-CM | POA: Insufficient documentation

## 2018-06-25 DIAGNOSIS — N179 Acute kidney failure, unspecified: Secondary | ICD-10-CM | POA: Diagnosis not present

## 2018-06-25 DIAGNOSIS — E781 Pure hyperglyceridemia: Secondary | ICD-10-CM | POA: Insufficient documentation

## 2018-06-25 DIAGNOSIS — Z880 Allergy status to penicillin: Secondary | ICD-10-CM | POA: Insufficient documentation

## 2018-06-25 DIAGNOSIS — E876 Hypokalemia: Secondary | ICD-10-CM | POA: Insufficient documentation

## 2018-06-25 DIAGNOSIS — K219 Gastro-esophageal reflux disease without esophagitis: Secondary | ICD-10-CM | POA: Insufficient documentation

## 2018-06-25 DIAGNOSIS — I739 Peripheral vascular disease, unspecified: Secondary | ICD-10-CM | POA: Diagnosis not present

## 2018-06-25 DIAGNOSIS — M79605 Pain in left leg: Secondary | ICD-10-CM | POA: Diagnosis not present

## 2018-06-25 DIAGNOSIS — Z9049 Acquired absence of other specified parts of digestive tract: Secondary | ICD-10-CM | POA: Insufficient documentation

## 2018-06-25 DIAGNOSIS — Z79890 Hormone replacement therapy (postmenopausal): Secondary | ICD-10-CM | POA: Insufficient documentation

## 2018-06-25 DIAGNOSIS — M25552 Pain in left hip: Secondary | ICD-10-CM | POA: Diagnosis not present

## 2018-06-25 DIAGNOSIS — R2689 Other abnormalities of gait and mobility: Secondary | ICD-10-CM | POA: Diagnosis not present

## 2018-06-25 DIAGNOSIS — D631 Anemia in chronic kidney disease: Secondary | ICD-10-CM | POA: Diagnosis not present

## 2018-06-25 DIAGNOSIS — M6281 Muscle weakness (generalized): Secondary | ICD-10-CM | POA: Insufficient documentation

## 2018-06-25 DIAGNOSIS — J449 Chronic obstructive pulmonary disease, unspecified: Secondary | ICD-10-CM | POA: Diagnosis not present

## 2018-06-25 DIAGNOSIS — I480 Paroxysmal atrial fibrillation: Secondary | ICD-10-CM | POA: Insufficient documentation

## 2018-06-25 DIAGNOSIS — Z885 Allergy status to narcotic agent status: Secondary | ICD-10-CM | POA: Insufficient documentation

## 2018-06-25 DIAGNOSIS — J9811 Atelectasis: Secondary | ICD-10-CM | POA: Insufficient documentation

## 2018-06-25 DIAGNOSIS — Z951 Presence of aortocoronary bypass graft: Secondary | ICD-10-CM | POA: Diagnosis not present

## 2018-06-25 DIAGNOSIS — Z882 Allergy status to sulfonamides status: Secondary | ICD-10-CM | POA: Insufficient documentation

## 2018-06-25 DIAGNOSIS — S32599A Other specified fracture of unspecified pubis, initial encounter for closed fracture: Secondary | ICD-10-CM | POA: Diagnosis present

## 2018-06-25 DIAGNOSIS — F419 Anxiety disorder, unspecified: Secondary | ICD-10-CM | POA: Diagnosis not present

## 2018-06-25 DIAGNOSIS — Z955 Presence of coronary angioplasty implant and graft: Secondary | ICD-10-CM | POA: Insufficient documentation

## 2018-06-25 DIAGNOSIS — Z888 Allergy status to other drugs, medicaments and biological substances status: Secondary | ICD-10-CM | POA: Insufficient documentation

## 2018-06-25 DIAGNOSIS — R296 Repeated falls: Secondary | ICD-10-CM | POA: Insufficient documentation

## 2018-06-25 HISTORY — DX: Other specified fracture of unspecified pubis, initial encounter for closed fracture: S32.599A

## 2018-06-25 LAB — CBC WITH DIFFERENTIAL/PLATELET
Abs Immature Granulocytes: 0.03 10*3/uL (ref 0.00–0.07)
BASOS ABS: 0 10*3/uL (ref 0.0–0.1)
Basophils Relative: 0 %
Eosinophils Absolute: 0.1 10*3/uL (ref 0.0–0.5)
Eosinophils Relative: 2 %
HCT: 33.6 % — ABNORMAL LOW (ref 36.0–46.0)
Hemoglobin: 10.4 g/dL — ABNORMAL LOW (ref 12.0–15.0)
Immature Granulocytes: 1 %
Lymphocytes Relative: 20 %
Lymphs Abs: 1.1 10*3/uL (ref 0.7–4.0)
MCH: 33.1 pg (ref 26.0–34.0)
MCHC: 31 g/dL (ref 30.0–36.0)
MCV: 107 fL — ABNORMAL HIGH (ref 80.0–100.0)
Monocytes Absolute: 0.6 10*3/uL (ref 0.1–1.0)
Monocytes Relative: 11 %
Neutro Abs: 3.7 10*3/uL (ref 1.7–7.7)
Neutrophils Relative %: 66 %
Platelets: 207 10*3/uL (ref 150–400)
RBC: 3.14 MIL/uL — AB (ref 3.87–5.11)
RDW: 14.8 % (ref 11.5–15.5)
WBC: 5.6 10*3/uL (ref 4.0–10.5)
nRBC: 0.4 % — ABNORMAL HIGH (ref 0.0–0.2)

## 2018-06-25 LAB — BASIC METABOLIC PANEL
Anion gap: 11 (ref 5–15)
BUN: 22 mg/dL (ref 8–23)
CO2: 31 mmol/L (ref 22–32)
Calcium: 9.3 mg/dL (ref 8.9–10.3)
Chloride: 98 mmol/L (ref 98–111)
Creatinine, Ser: 1.82 mg/dL — ABNORMAL HIGH (ref 0.44–1.00)
GFR calc non Af Amer: 26 mL/min — ABNORMAL LOW (ref >60)
GFR, EST AFRICAN AMERICAN: 31 mL/min — AB (ref >60)
Glucose, Bld: 101 mg/dL — ABNORMAL HIGH (ref 70–99)
Potassium: 3.3 mmol/L — ABNORMAL LOW (ref 3.5–5.1)
Sodium: 140 mmol/L (ref 135–145)

## 2018-06-25 MED ORDER — FUROSEMIDE 40 MG PO TABS
40.0000 mg | ORAL_TABLET | Freq: Every day | ORAL | Status: DC
  Administered 2018-06-25 – 2018-07-04 (×10): 40 mg via ORAL
  Filled 2018-06-25 (×10): qty 1

## 2018-06-25 MED ORDER — CALCITRIOL 0.5 MCG PO CAPS
0.5000 ug | ORAL_CAPSULE | ORAL | Status: DC
  Administered 2018-06-25 – 2018-07-03 (×5): 0.5 ug via ORAL
  Filled 2018-06-25 (×5): qty 1

## 2018-06-25 MED ORDER — HEPARIN SODIUM (PORCINE) 5000 UNIT/ML IJ SOLN
5000.0000 [IU] | Freq: Three times a day (TID) | INTRAMUSCULAR | Status: DC
  Administered 2018-06-25 – 2018-07-04 (×28): 5000 [IU] via SUBCUTANEOUS
  Filled 2018-06-25 (×27): qty 1

## 2018-06-25 MED ORDER — CLONAZEPAM 1 MG PO TABS
2.0000 mg | ORAL_TABLET | Freq: Every day | ORAL | Status: DC
  Administered 2018-06-25 – 2018-07-03 (×9): 2 mg via ORAL
  Filled 2018-06-25 (×9): qty 2

## 2018-06-25 MED ORDER — METHOCARBAMOL 500 MG PO TABS
500.0000 mg | ORAL_TABLET | Freq: Three times a day (TID) | ORAL | Status: DC
  Administered 2018-06-25 – 2018-07-04 (×27): 500 mg via ORAL
  Filled 2018-06-25 (×28): qty 1

## 2018-06-25 MED ORDER — LORATADINE 10 MG PO TABS
10.0000 mg | ORAL_TABLET | Freq: Every day | ORAL | Status: DC
  Administered 2018-06-25 – 2018-07-04 (×10): 10 mg via ORAL
  Filled 2018-06-25 (×10): qty 1

## 2018-06-25 MED ORDER — METHOCARBAMOL 1000 MG/10ML IJ SOLN
500.0000 mg | Freq: Three times a day (TID) | INTRAVENOUS | Status: DC
  Filled 2018-06-25 (×2): qty 5

## 2018-06-25 MED ORDER — DIPHENHYDRAMINE HCL 50 MG/ML IJ SOLN
12.5000 mg | Freq: Once | INTRAMUSCULAR | Status: AC
  Administered 2018-06-25: 12.5 mg via INTRAVENOUS
  Filled 2018-06-25: qty 1

## 2018-06-25 MED ORDER — ONDANSETRON HCL 4 MG/2ML IJ SOLN
4.0000 mg | Freq: Once | INTRAMUSCULAR | Status: AC
  Administered 2018-06-25: 4 mg via INTRAVENOUS
  Filled 2018-06-25: qty 2

## 2018-06-25 MED ORDER — ALBUTEROL SULFATE HFA 108 (90 BASE) MCG/ACT IN AERS: 2.0000 | INHALATION_SPRAY | RESPIRATORY_TRACT | Status: DC | PRN

## 2018-06-25 MED ORDER — ISOSORBIDE MONONITRATE ER 60 MG PO TB24
60.0000 mg | ORAL_TABLET | Freq: Every day | ORAL | Status: DC
  Administered 2018-06-25 – 2018-07-04 (×10): 60 mg via ORAL
  Filled 2018-06-25 (×10): qty 1

## 2018-06-25 MED ORDER — BISACODYL 10 MG RE SUPP: 10.0000 mg | Freq: Every day | RECTAL | Status: DC | PRN

## 2018-06-25 MED ORDER — MOMETASONE FURO-FORMOTEROL FUM 200-5 MCG/ACT IN AERO
2.0000 | INHALATION_SPRAY | Freq: Two times a day (BID) | RESPIRATORY_TRACT | Status: DC
  Administered 2018-06-25 – 2018-07-04 (×14): 2 via RESPIRATORY_TRACT
  Filled 2018-06-25: qty 8.8

## 2018-06-25 MED ORDER — MORPHINE SULFATE (PF) 4 MG/ML IV SOLN
4.0000 mg | Freq: Once | INTRAVENOUS | Status: AC
  Administered 2018-06-25: 4 mg via INTRAVENOUS
  Filled 2018-06-25: qty 1

## 2018-06-25 MED ORDER — ALBUTEROL SULFATE (2.5 MG/3ML) 0.083% IN NEBU: 2.5000 mg | INHALATION_SOLUTION | Freq: Four times a day (QID) | RESPIRATORY_TRACT | Status: DC | PRN

## 2018-06-25 MED ORDER — HYDROCODONE-ACETAMINOPHEN 5-325 MG PO TABS
1.0000 | ORAL_TABLET | ORAL | Status: DC | PRN
  Administered 2018-06-25 – 2018-07-04 (×19): 1 via ORAL
  Filled 2018-06-25 (×21): qty 1

## 2018-06-25 MED ORDER — HYDROMORPHONE HCL 1 MG/ML IJ SOLN: 0.5000 mg | INTRAMUSCULAR | Status: DC | PRN

## 2018-06-25 MED ORDER — SENNOSIDES-DOCUSATE SODIUM 8.6-50 MG PO TABS
1.0000 | ORAL_TABLET | Freq: Two times a day (BID) | ORAL | Status: DC
  Administered 2018-06-25 – 2018-07-04 (×18): 1 via ORAL
  Filled 2018-06-25 (×18): qty 1

## 2018-06-25 MED ORDER — LEVOTHYROXINE SODIUM 112 MCG PO TABS
112.0000 ug | ORAL_TABLET | Freq: Every day | ORAL | Status: DC
  Administered 2018-06-26 – 2018-07-04 (×9): 112 ug via ORAL
  Filled 2018-06-25 (×9): qty 1

## 2018-06-25 NOTE — ED Provider Notes (Signed)
[facility] Provider Note   CSN: [id number] Arrival date & time: 06/25/18  1333     History   Chief Complaint Chief Complaint  Patient presents with  . Fall    HPI [name] is a 77 y.o. female with history of COPD with chronic respiratory failure on 2 to 3 L nasal cannula at home, CAD status post PCI, paroxysmal atrial fibrillation not on anticoagulation due to recurrent falls, renal insufficiency, recent sacral fracture status post plasty, is here for evaluation of fall.  This happened on Friday.  Patient states she stood up and lost her footing falling onto her left hip.  She developed severe, sudden left groin pain.  This has been constant, worsening.  She has been unable to put weight on this leg due to the pain.  No alleviating factors.  Aggravated by any movement, direct palpation and weightbearing.  Is not on any blood thinners.  She denies prodromal symptoms prior to the fall such as chest pain, shortness of breath, lightheadedness, palpitations.  There was no LOC.  There was no head trauma.  She denies associated headache, vision changes, changes to her breathing or oxygen requirements, saddle anesthesia, loss of bladder or bowel control, loss of sensation or weakness to her extremities.  States her left leg feels weak but this is because it hurts every time she moves it.  HPI  Past Medical History:  Diagnosis Date  . Anemia   . CAD (coronary artery disease)    a. 10/2002 PCI: LAD 1m (2.75x23 Cypher DES); b. 09/2004 Cath: LM nl, LAD 40p, patent stent, D1 30p, LCX 20p, diff dzs throughout, OM1 50p, OM2 60p, OM3 40p, RCA 40p-->Med Rx; c. 10/2012 MV: No ischemia/infarct.  . Carotid artery disease ([place])   . Cervical disc disease   . CKD (chronic kidney disease), stage III ([place])   . COPD (chronic obstructive pulmonary disease) ([place])   . Diastolic dysfunction    a. 03/2017 Echo: Ef 60-65%, Gr2 DD, mild MR, mildly dil LA/RA, mildly to mod  dil RV w/ nl fxn, PASP 66mmHg.  Marland Kitchen Dysphagia   . GERD (gastroesophageal reflux disease)   . History of kidney stones   . Hypertension    2002  . Hypertriglyceridemia   . Hypothyroidism   . PAD (peripheral artery disease) ([place])    a. 10/2017 ABI's R = 0.78, L = 0.88. Duplex showed borderline R CFA and SFA dzs->med rx.  Marland Kitchen PAF (paroxysmal atrial fibrillation) ([place])    a. Amio/Pradaxa (CHA2DS2VASc = 6).  Marland Kitchen PONV (postoperative nausea and vomiting)   . Right renal artery stenosis ([place])    a. 09/2017 Renal Artery Duplex: no significant LRA stenosis. RRA <60%.    Patient Active Problem List   Diagnosis Date Noted  . Closed fracture of multiple pubic rami ([place]) 06/25/2018  . ARF (acute renal failure) ([place]) 01/02/2018  . AKI (acute kidney injury) ([place]) 12/19/2017  . COPD (chronic obstructive pulmonary disease) ([place]) 07/26/2017  . Chronic renal impairment, stage 3 (moderate) ([place]) 07/19/2016  . Back pain 08/01/2015  . Bradycardia 08/01/2015  . Hypothyroidism 01/27/2015  . HTN (hypertension) 01/27/2015  . Neck pain 01/18/2014  . Paroxysmal atrial fibrillation ([place]) 03/26/2013  . Fatigue 03/26/2013  . SOB (shortness of breath) 03/26/2013  . Chest discomfort 03/26/2013  . CAD (coronary artery disease) 03/26/2013  . Smoker 03/26/2013  . Hyperlipidemia 03/26/2013    Past Surgical History:  Procedure Laterality Date  . ABDOMINAL HYSTERECTOMY    .  AMPUTATION Right 04/12/2014   Procedure: Revision AMPUTATION Right Long DIGIT;  Surgeon: Leanora Cover, MD;  Location: [facility];  Service: Orthopedics;  Laterality: Right;  . APPLICATION OF WOUND VAC Left 01/06/2018   Procedure: APPLICATION OF WOUND VAC;  Surgeon: Samara Deist, DPM;  Location: [facility];  Service: Podiatry;  Laterality: Left;  . BALLOON DILATION  05/05/2011   Procedure: BALLOON DILATION;  Surgeon: Rogene Houston, MD;  [facility];  Service: Endoscopy;  Laterality: N/A;  . CARDIAC CATHETERIZATION     with stent 2004  CYPHER  2.75 x 74mm coronary stent in the mid left anterior descending stenotic lesion    Last stress test showed EF of 775  . CARDIOVERSION    . CATARACT EXTRACTION Left 2002  . CATARACT EXTRACTION W/PHACO Right 05/14/2013   Procedure: CATARACT EXTRACTION PHACO AND INTRAOCULAR LENS PLACEMENT RIGHT EYE;  Surgeon: Tonny Branch, MD;  Location: [facility];  Service: Ophthalmology;  Laterality: Right;  CDE:  20.45  . CERVICAL DISC SURGERY    . CHOLECYSTECTOMY    . CORONARY ANGIOPLASTY     1 stent  . ESOPHAGEAL DILATION N/A 04/25/2015   Procedure: ESOPHAGEAL DILATION;  Surgeon: Rogene Houston, MD;  [facility];  Service: Endoscopy;  Laterality: N/A;  . ESOPHAGOGASTRODUODENOSCOPY N/A 04/25/2015   Procedure: ESOPHAGOGASTRODUODENOSCOPY (EGD);  Surgeon: Rogene Houston, MD;  [facility];  Service: Endoscopy;  Laterality: N/A;  1225  . IR SACROPLASTY BILATERAL  06/13/2018  . IRRIGATION AND DEBRIDEMENT FOOT Left 01/06/2018   Procedure: IRRIGATION AND DEBRIDEMENT FOOT;  Surgeon: Samara Deist, DPM;  Location: [facility];  Service: Podiatry;  Laterality: Left;  Marland Kitchen MALONEY DILATION  05/05/2011   Procedure: MALONEY DILATION;  Surgeon: Rogene Houston, MD;  [facility];  Service: Endoscopy;  Laterality: N/A;  . right knee arthroscopy  2010  . SAVORY DILATION  05/05/2011   Procedure: SAVORY DILATION;  Surgeon: Rogene Houston, MD;  [facility];  Service: Endoscopy;  Laterality: N/A;  . SHOULDER SURGERY Right    torn rotator cuff  . TOENAIL EXCISION    . TOTAL ABDOMINAL HYSTERECTOMY W/ BILATERAL SALPINGOOPHORECTOMY       OB History   No obstetric history on file.      Home Medications    Prior to Admission medications   Medication Sig Start Date End Date Taking? Authorizing Provider  acetaminophen (TYLENOL) 500 MG tablet Take 500-1,000 mg by mouth 2 (two) times daily as needed for headache (pain). Lunch and bedtime   Yes [provider]  albuterol  (PROVENTIL HFA;VENTOLIN HFA) 108 (90 Base) MCG/ACT inhaler Inhale 2 puffs into the lungs every 4 (four) hours as needed for wheezing or shortness of breath.   Yes [provider]  albuterol (PROVENTIL) (2.5 MG/3ML) 0.083% nebulizer solution Take 2.5 mg by nebulization every 6 (six) hours as needed for shortness of breath.    Yes [provider]  Ascorbic Acid (VITAMIN C) 1000 MG tablet Take 1,000 mg by mouth daily.   Yes [provider]  calcitRIOL (ROCALTROL) 0.5 MCG capsule Take 0.5 mcg by mouth every other day.  01/06/17  Yes [provider]  cetirizine (ZYRTEC) 10 MG tablet Take 10 mg by mouth daily.    Yes [provider]  clonazePAM (KLONOPIN) 1 MG tablet Take 2-3 mg by mouth at bedtime.    Yes [provider]  Fluticasone-Salmeterol (ADVAIR) 500-50 MCG/DOSE AEPB Inhale 1 puff into the lungs  2 (two) times daily.   Yes [provider]  furosemide (LASIX) 40 MG tablet Take 40 mg by mouth daily.   Yes [provider]  HYDROcodone-acetaminophen (NORCO/VICODIN) 5-325 MG tablet Take 1 tablet by mouth every 6 (six) hours as needed for moderate pain. Patient taking differently: Take 1 tablet by mouth 4 (four) times daily as needed for severe pain.  06/01/18  Yes Noemi Chapel, MD  isosorbide mononitrate (IMDUR) 60 MG 24 hr tablet TAKE ONE TABLET (60MG  TOTAL) BY MOUTH TWO TIMES DAILY Patient taking differently: Take 60 mg by mouth 2 (two) times daily.  12/29/17  Yes Minna Merritts, MD  levothyroxine (SYNTHROID, LEVOTHROID) 112 MCG tablet Take 112 mcg by mouth daily before breakfast.  05/15/18  Yes [provider]  nitroGLYCERIN (NITROLINGUAL) 0.4 MG/SPRAY spray Place 1 spray under the tongue every 5 (five) minutes x 3 doses as needed for chest pain. 07/27/17  Yes Gollan, Kathlene November, MD  omega-3 acid ethyl esters (LOVAZA) 1 G capsule Take 2 g by mouth 2 (two) times daily.     Yes [provider]  vitamin B-12  (CYANOCOBALAMIN) 1000 MCG tablet Take 1,000 mcg by mouth daily.    Yes [provider]    Family History Family History  Problem Relation Age of Onset  . Diabetes Mother   . Heart failure Mother   . Stroke Mother   . Heart attack Father   . Pancreatic cancer Sister   . Heart disease Sister   . Heart disease Brother   . Hypertension Sister   . Colon cancer Neg Hx     Social History Social History   Tobacco Use  . Smoking status: Current Every Day Smoker    Packs/day: 1.00    Years: 56.00    Pack years: 56.00    Types: Cigarettes  . Smokeless tobacco: Never Used  Substance Use Topics  . Alcohol use: Yes    Alcohol/week: 1.0 standard drinks    Types: 1 Cans of beer per week  . Drug use: No     Allergies   Ciprofloxacin; Penicillins; Amlodipine; Morphine and related; Cefaclor; Codeine; Lipitor [atorvastatin]; Prednisone; and Sulfa antibiotics   Review of Systems Review of Systems  Respiratory: Positive for cough (chronic) and wheezing (chronic).   Musculoskeletal: Positive for gait problem and joint swelling.  All other systems reviewed and are negative.    Physical Exam Updated Vital Signs BP (!) 141/64   Pulse 92   Temp 99.4 F (37.4 C) (Oral)   Resp 18   SpO2 97%   Physical Exam Vitals signs and nursing note reviewed.  Constitutional:      General: She is not in acute distress.    Appearance: She is well-developed.     Comments: NAD.  HENT:     Head: Normocephalic and atraumatic.     Comments: No obvious signs of trauma to the head or scalp.    Right Ear: External ear normal.     Left Ear: External ear normal.     Nose: Nose normal.  Eyes:     General: No scleral icterus.    Conjunctiva/sclera: Conjunctivae normal.  Neck:     Musculoskeletal: Normal range of motion and neck supple.  Cardiovascular:     Rate and Rhythm: Normal rate and regular rhythm.     Heart sounds: Normal heart sounds. No murmur.     Comments: Trace pitting edema  to ankles bilaterally, symmetric.  1+ PT pulses.  No  calf tenderness. Pulmonary:     Effort: Pulmonary effort is normal.     Breath sounds: Wheezing present.     Comments: Faint, end expiratory wheezing in all lung fields.  On 2 L nasal cannula with SPO2 greater than 94%.  No increased work of breathing. Abdominal:     General: Abdomen is flat.     Tenderness: There is no abdominal tenderness.  Musculoskeletal: Normal range of motion.        General: Tenderness present. No deformity.     Comments: CT L-spine: No midline tenderness.  No paraspinal muscular tenderness.  No bruising to the back. Pelvis: Tenderness to the left inguinal crease.  Decreased range of motion of the left hip due to pain.  Pain with internal and external rotation of the left hip.  No obvious leg shortening or rotation.  No pubic symphysis separation noted.  No instability with AP/lateral compression.  Skin:    General: Skin is warm and dry.     Capillary Refill: Capillary refill takes less than 2 seconds.     Comments: No obvious bruising to anterior/lateral  Neurological:     Mental Status: She is alert and oriented to person, place, and time.     Comments: Sensation to light touch intact in upper and lower extremities.  5/5 strength with handgrip and ankle flexion/extension bilaterally.  Psychiatric:        Behavior: Behavior normal.        Thought Content: Thought content normal.        Judgment: Judgment normal.      ED Treatments / Results  Labs (all labs ordered are listed, but only abnormal results are displayed) Labs Reviewed  CBC WITH DIFFERENTIAL/PLATELET - Abnormal; Notable for the following components:      Result Value   RBC 3.14 (*)    Hemoglobin 10.4 (*)    HCT 33.6 (*)    MCV 107.0 (*)    nRBC 0.4 (*)    All other components within normal limits  BASIC METABOLIC PANEL    EKG None  Radiology Dg Lumbar Spine Complete  Result Date: 06/25/2018 CLINICAL DATA:  Fall, back pain EXAM:  LUMBAR SPINE - COMPLETE 4+ VIEW COMPARISON:  Lumbar MRI 05/19/2018 FINDINGS: Normal alignment of vertebral bodies. Loss of vertebral height in the L1 vertebral body similar comparison MRI. There is osteophytosis at this level. No acute loss vertebral body height or disc height. No pars fracture. No subluxation. Atherosclerotic calcification of the aorta. IMPRESSION: 1. No acute findings of the spine. 2. 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  Atherosclerotic calcification of the aorta. Electronically Signed   By: Suzy Bouchard M.D.   On: 06/25/2018 14:52   Dg Sacrum/coccyx  Result Date: 06/25/2018 CLINICAL DATA:  Fall, sacral pain.  Recent sacral plasty EXAM: SACRUM AND COCCYX - 2+ VIEW COMPARISON:  Pelvis film 05/12/2018 FINDINGS: Sacroplasty cement noted in LEFT and RIGHT sacral ala. No evidence of sacral fracture. No displaced coccygeal fracture. Atherosclerotic calcification of the aorta. IMPRESSION: No evidence of acute sacral or coccygeal fracture. Electronically Signed   By: Suzy Bouchard M.D.   On: 06/25/2018 14:48   Dg Hip Unilat With Pelvis 2-3 Views Left  Result Date: 06/25/2018 CLINICAL DATA:  [place] 2 days ago. Left groin pain. EXAM: DG HIP (WITH OR WITHOUT PELVIS) 2-3V LEFT COMPARISON:  None. FINDINGS: Bones are demineralized. SI joints and symphysis pubis unremarkable. Patient is status post bilateral sacral augmentation.  Acute fractures noted in the left superior and inferior pubic rami. AP and frog-leg lateral views of the left hip show no femoral neck fracture. IMPRESSION: Acute fractures of the left superior and inferior pubic rami. No left femoral neck fracture. Electronically Signed   By: Misty Stanley M.D.   On: 06/25/2018 14:47    Procedures Procedures (including critical care time)  Medications Ordered in ED Medications  morphine 4 MG/ML injection 4 mg (4 mg Intravenous Given 06/25/18 1534)  ondansetron (ZOFRAN) injection 4 mg  (4 mg Intravenous Given 06/25/18 1534)  diphenhydrAMINE (BENADRYL) injection 12.5 mg (12.5 mg Intravenous Given 06/25/18 1542)     Initial Impression / Assessment and Plan / ED Course  I have reviewed the triage vital signs and the nursing notes.  Pertinent labs & imaging results that were available during my care of the patient were reviewed by me and considered in my medical decision making (see chart for details).  Clinical Course as of Jun 25 1550  Sun Jun 25, 2018  1455 IMPRESSION: Acute fractures of the left superior and inferior pubic rami.  No left femoral neck fracture.  DG Hip Unilat With Pelvis 2-3 Views Left [CG]    Clinical Course User Index [CG] Kinnie Feil, PA-C   77 year old chronically ill patient, chronic respiratory failure with supplemental oxygen at home, recurrent falls is here with traumatic left superior and inferior pubic rami fractures after a mechanical fall 2 days ago.  She has not been able to ambulate in 2 days since the fall.  No other signs of significant head, CTL spine or extremity injury today.  No changes to her breathing status and oxygen demand.  Her affected extremity is neurovascularly intact.  Given age, risk, recurrent falls we will obtain screening labs and discuss with hospitalist for observation, pain control, PT/OT.  Discussed this plan with patient and she is in agreement.  Final Clinical Impressions(s) / ED Diagnoses   Final diagnoses:  Fracture of multiple pubic rami, left, closed, initial encounter [facility])  Fall, initial encounter  Chronic respiratory failure with hypoxia [facility])    ED Discharge Orders    None       Arlean Hopping 06/25/18 1551    Valarie Merino, MD 06/25/18 [phone]

## 2018-06-25 NOTE — ED Notes (Signed)
ED Provider at bedside. 

## 2018-06-25 NOTE — ED Notes (Signed)
Patient transported to X-ray 

## 2018-06-25 NOTE — Plan of Care (Signed)

## 2018-06-25 NOTE — ED Triage Notes (Signed)
Pt states she fell on Friday. No complains of left hip pain and difficulty walking. Husband states she hasnt walked since the fall. Reports several falls several times since June.

## 2018-06-25 NOTE — ED Notes (Signed)
Pt SpO2 on RA 86%. No distress noted, Pt states she wears 2 liters at home with baseline SpO2 being 92-93%. Placed pt on 2 litrs [place]. SpO2 95%

## 2018-06-25 NOTE — H&P (Signed)
[facility] History and Physical   Patient: [name] JXB:[id number]   PCP: Lemmie Evens, MD DOB: 01/04/1941   DOA: 06/25/2018   DOS: 06/25/2018   DOS: the patient was seen and examined on 06/25/2018  Patient coming from: The patient is coming from home.  Chief Complaint: Fall Left hip pain  HPI: [name] is a 77 y.o. female with Past medical history of CAD S/P CABG, COPD, CKD, chronic pain syndrome, chronic anxiety, anemia of chronic kidney disease, chronic diastolic CHF, PVD, recurrent fall. Patient presented to the hospital with complaints of a fall. 2 days ago she had a fall while she was walking around, still acting nostra footing and fell on the ground. She denies any head injury, neck injury or headache or neck pain.  She also denies any passing out episode.  She denies any chest pain or shortness of breath or nausea or vomiting. She denies any recent change in her medication. She remains compliant with all her medications. No diarrhea no constipation no vomiting reported at home. After the fall the patient was unable to walk around several due to severe pain. She has a walker as well as wheelchair at home. She takes Norco at home which was not adequate to control her pain and therefore decided to come to the hospital.  ED Course: Patient was given IV morphine, an attempt was made to walk the patient but patient was unable to walk without multiple assistance and therefore patient was referred for admission for pain control. Per pharmacy tech, the patient recently filled her Klonopin prescription but her bottle appears to be empty.  At her baseline ambulates with walker And is independent for most of her ADL; manages her medication on her own.  Review of Systems: as mentioned in the history of present illness.  All other systems reviewed and are negative.  Past Medical History:  Diagnosis Date  . Anemia   . CAD (coronary artery disease)    a. 10/2002 PCI:  LAD 19m (2.75x23 Cypher DES); b. 09/2004 Cath: LM nl, LAD 40p, patent stent, D1 30p, LCX 20p, diff dzs throughout, OM1 50p, OM2 60p, OM3 40p, RCA 40p-->Med Rx; c. 10/2012 MV: No ischemia/infarct.  . Carotid artery disease ([place])   . Cervical disc disease   . CKD (chronic kidney disease), stage III ([place])   . COPD (chronic obstructive pulmonary disease) ([place])   . Diastolic dysfunction    a. 03/2017 Echo: Ef 60-65%, Gr2 DD, mild MR, mildly dil LA/RA, mildly to mod dil RV w/ nl fxn, PASP 66mmHg.  Marland Kitchen Dysphagia   . GERD (gastroesophageal reflux disease)   . History of kidney stones   . Hypertension    2002  . Hypertriglyceridemia   . Hypothyroidism   . PAD (peripheral artery disease) ([place])    a. 10/2017 ABI's R = 0.78, L = 0.88. Duplex showed borderline R CFA and SFA dzs->med rx.  Marland Kitchen PAF (paroxysmal atrial fibrillation) ([place])    a. Amio/Pradaxa (CHA2DS2VASc = 6).  Marland Kitchen PONV (postoperative nausea and vomiting)   . Right renal artery stenosis ([place])    a. 09/2017 Renal Artery Duplex: no significant LRA stenosis. RRA <60%.   Past Surgical History:  Procedure Laterality Date  . ABDOMINAL HYSTERECTOMY    . AMPUTATION Right 04/12/2014   Procedure: Revision AMPUTATION Right Long DIGIT;  Surgeon: Leanora Cover, MD;  Location: [facility];  Service: Orthopedics;  Laterality: Right;  . APPLICATION OF WOUND VAC Left 01/06/2018   Procedure: APPLICATION  OF WOUND VAC;  Surgeon: Samara Deist, DPM;  Location: [facility];  Service: Podiatry;  Laterality: Left;  . BALLOON DILATION  05/05/2011   Procedure: BALLOON DILATION;  Surgeon: Rogene Houston, MD;  [facility];  Service: Endoscopy;  Laterality: N/A;  . CARDIAC CATHETERIZATION     with stent 2004  CYPHER 2.75 x 93mm coronary stent in the mid left anterior descending stenotic lesion    Last stress test showed EF of 775  . CARDIOVERSION    . CATARACT EXTRACTION Left 2002  . CATARACT EXTRACTION W/PHACO Right 05/14/2013   Procedure: CATARACT EXTRACTION PHACO AND  INTRAOCULAR LENS PLACEMENT RIGHT EYE;  Surgeon: Tonny Branch, MD;  Location: [facility];  Service: Ophthalmology;  Laterality: Right;  CDE:  20.45  . CERVICAL DISC SURGERY    . CHOLECYSTECTOMY    . CORONARY ANGIOPLASTY     1 stent  . ESOPHAGEAL DILATION N/A 04/25/2015   Procedure: ESOPHAGEAL DILATION;  Surgeon: Rogene Houston, MD;  [facility];  Service: Endoscopy;  Laterality: N/A;  . ESOPHAGOGASTRODUODENOSCOPY N/A 04/25/2015   Procedure: ESOPHAGOGASTRODUODENOSCOPY (EGD);  Surgeon: Rogene Houston, MD;  [facility];  Service: Endoscopy;  Laterality: N/A;  1225  . IR SACROPLASTY BILATERAL  06/13/2018  . IRRIGATION AND DEBRIDEMENT FOOT Left 01/06/2018   Procedure: IRRIGATION AND DEBRIDEMENT FOOT;  Surgeon: Samara Deist, DPM;  Location: [facility];  Service: Podiatry;  Laterality: Left;  Marland Kitchen MALONEY DILATION  05/05/2011   Procedure: MALONEY DILATION;  Surgeon: Rogene Houston, MD;  [facility];  Service: Endoscopy;  Laterality: N/A;  . right knee arthroscopy  2010  . SAVORY DILATION  05/05/2011   Procedure: SAVORY DILATION;  Surgeon: Rogene Houston, MD;  [facility];  Service: Endoscopy;  Laterality: N/A;  . SHOULDER SURGERY Right    torn rotator cuff  . TOENAIL EXCISION    . TOTAL ABDOMINAL HYSTERECTOMY W/ BILATERAL SALPINGOOPHORECTOMY     Social History:  reports that she has been smoking cigarettes. She has a 56.00 pack-year smoking history. She has never used smokeless tobacco. She reports current alcohol use of about 1.0 standard drinks of alcohol per week. She reports that she does not use drugs.  Allergies  Allergen Reactions  . Ciprofloxacin Anaphylaxis  . Penicillins Anaphylaxis    Near death experience,  DID THE REACTION INVOLVE: Swelling of the face/tongue/throat, SOB, or low BP? Yes Sudden or severe rash/hives, skin peeling, or the inside of the mouth or nose? No Did it require medical treatment? Yes When did it last happen?age -  20's or 30's If all above answers are "NO", may proceed with cephalosporin use.  . Amlodipine Swelling    Leg swelling  . Morphine And Related Hives and Itching       . Cefaclor Nausea And Vomiting and Other (See Comments)  . Codeine Nausea Only and Other (See Comments)    Cramps  . Lipitor [Atorvastatin] Other (See Comments)    Myalgias   . Prednisone Palpitations  . Sulfa Antibiotics Rash         Family History  Problem Relation Age of Onset  . Diabetes Mother   . Heart failure Mother   . Stroke Mother   . Heart attack Father   . Pancreatic cancer Sister   . Heart disease Sister   . Heart disease Brother   . Hypertension Sister   . Colon cancer Neg Hx      Prior to  Admission medications   Medication Sig Start Date End Date Taking? Authorizing Provider  acetaminophen (TYLENOL) 500 MG tablet Take 500-1,000 mg by mouth 2 (two) times daily as needed for headache (pain). Lunch and bedtime   Yes [provider]  albuterol (PROVENTIL HFA;VENTOLIN HFA) 108 (90 Base) MCG/ACT inhaler Inhale 2 puffs into the lungs every 4 (four) hours as needed for wheezing or shortness of breath.   Yes [provider]  albuterol (PROVENTIL) (2.5 MG/3ML) 0.083% nebulizer solution Take 2.5 mg by nebulization every 6 (six) hours as needed for shortness of breath.    Yes [provider]  Ascorbic Acid (VITAMIN C) 1000 MG tablet Take 1,000 mg by mouth daily.   Yes [provider]  calcitRIOL (ROCALTROL) 0.5 MCG capsule Take 0.5 mcg by mouth every other day.  01/06/17  Yes [provider]  cetirizine (ZYRTEC) 10 MG tablet Take 10 mg by mouth daily.    Yes [provider]  clonazePAM (KLONOPIN) 1 MG tablet Take 2-3 mg by mouth at bedtime.    Yes [provider]  Fluticasone-Salmeterol (ADVAIR) 500-50 MCG/DOSE AEPB Inhale 1 puff into the lungs 2 (two) times daily.   Yes [provider]  furosemide (LASIX) 40 MG tablet Take 40 mg by mouth  daily.   Yes [provider]  HYDROcodone-acetaminophen (NORCO/VICODIN) 5-325 MG tablet Take 1 tablet by mouth every 6 (six) hours as needed for moderate pain. Patient taking differently: Take 1 tablet by mouth 4 (four) times daily as needed for severe pain.  06/01/18  Yes Noemi Chapel, MD  isosorbide mononitrate (IMDUR) 60 MG 24 hr tablet TAKE ONE TABLET (60MG  TOTAL) BY MOUTH TWO TIMES DAILY Patient taking differently: Take 60 mg by mouth 2 (two) times daily.  12/29/17  Yes Minna Merritts, MD  levothyroxine (SYNTHROID, LEVOTHROID) 112 MCG tablet Take 112 mcg by mouth daily before breakfast.  05/15/18  Yes [provider]  nitroGLYCERIN (NITROLINGUAL) 0.4 MG/SPRAY spray Place 1 spray under the tongue every 5 (five) minutes x 3 doses as needed for chest pain. 07/27/17  Yes Gollan, Kathlene November, MD  omega-3 acid ethyl esters (LOVAZA) 1 G capsule Take 2 g by mouth 2 (two) times daily.     Yes [provider]  vitamin B-12 (CYANOCOBALAMIN) 1000 MCG tablet Take 1,000 mcg by mouth daily.    Yes [provider]    Physical Exam: Vitals:   06/25/18 1405 06/25/18 1415 06/25/18 1530 06/25/18 1600  BP: (!) 145/57 124/62 (!) 141/64 (!) 123/50  Pulse: 92 93 92 88  Resp: 18     Temp:      TempSrc:      SpO2: (!) 87% 94% 97% 97%    General: Alert, Awake and Oriented to Time, Place and Person. Appear in marked distress, affect appropriate Eyes: PERRL, Conjunctiva normal ENT: Oral Mucosa clear moist. Neck: difficult to assess  JVD,  no Abnormal Mass Or lumps Cardiovascular: S1 and S2 Present, no Murmur, Peripheral Pulses Present Respiratory: normal respiratory effort, Bilateral Air entry equal and Decreased, no use of accessory muscle, no Crackles, bilateral  wheezes Abdomen: Bowel Sound present, Soft and no tenderness, no hernia Skin: no redness, no Rash, no induration Extremities: bilateral  Pedal edema, no calf tenderness Neurologic: Grossly no focal neuro deficit.  Bilaterally Equal motor strength  Labs on Admission:  CBC: Recent Labs  Lab 06/22/18 1310 06/25/18 1533  WBC  --  5.6  NEUTROABS  --  3.7  HGB 11.0*  10.4*  HCT  --  33.6*  MCV  --  107.0*  PLT  --  458   Basic Metabolic Panel: Recent Labs  Lab 06/25/18 1533  NA 140  K 3.3*  CL 98  CO2 31  GLUCOSE 101*  BUN 22  CREATININE 1.82*  CALCIUM 9.3   GFR: Estimated Creatinine Clearance: 27.4 mL/min (A) (by C-G formula based on SCr of 1.82 mg/dL (H)). Liver Function Tests: No results for input(s): AST, ALT, ALKPHOS, BILITOT, PROT, ALBUMIN in the last 168 hours. No results for input(s): LIPASE, AMYLASE in the last 168 hours. No results for input(s): AMMONIA in the last 168 hours. Coagulation Profile: No results for input(s): INR, PROTIME in the last 168 hours. Cardiac Enzymes: No results for input(s): CKTOTAL, CKMB, CKMBINDEX, TROPONINI in the last 168 hours. BNP (last 3 results) No results for input(s): PROBNP in the last 8760 hours. HbA1C: No results for input(s): HGBA1C in the last 72 hours. CBG: No results for input(s): GLUCAP in the last 168 hours. Lipid Profile: No results for input(s): CHOL, HDL, LDLCALC, TRIG, CHOLHDL, LDLDIRECT in the last 72 hours. Thyroid Function Tests: No results for input(s): TSH, T4TOTAL, FREET4, T3FREE, THYROIDAB in the last 72 hours. Anemia Panel: No results for input(s): VITAMINB12, FOLATE, FERRITIN, TIBC, IRON, RETICCTPCT in the last 72 hours. Urine analysis:    Component Value Date/Time   COLORURINE YELLOW (A) 12/18/2017 2340   APPEARANCEUR CLOUDY (A) 12/18/2017 2340   LABSPEC 1.013 12/18/2017 2340   PHURINE 5.0 12/18/2017 2340   GLUCOSEU NEGATIVE 12/18/2017 2340   HGBUR NEGATIVE 12/18/2017 2340   BILIRUBINUR NEGATIVE 12/18/2017 2340   KETONESUR NEGATIVE 12/18/2017 2340   PROTEINUR NEGATIVE 12/18/2017 2340   NITRITE NEGATIVE 12/18/2017 2340   LEUKOCYTESUR LARGE (A) 12/18/2017 2340    Radiological Exams on Admission: Dg  Lumbar Spine Complete  Result Date: 06/25/2018 CLINICAL DATA:  Fall, back pain EXAM: LUMBAR SPINE - COMPLETE 4+ VIEW COMPARISON:  Lumbar MRI 05/19/2018 FINDINGS: Normal alignment of vertebral bodies. Loss of vertebral height in the L1 vertebral body similar comparison MRI. There is osteophytosis at this level. No acute loss vertebral body height or disc height. No pars fracture. No subluxation. Atherosclerotic calcification of the aorta. IMPRESSION: 1. No acute findings of the spine. 2. 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  Atherosclerotic calcification of the aorta. Electronically Signed   By: Suzy Bouchard M.D.   On: 06/25/2018 14:52   Dg Sacrum/coccyx  Result Date: 06/25/2018 CLINICAL DATA:  Fall, sacral pain.  Recent sacral plasty EXAM: SACRUM AND COCCYX - 2+ VIEW COMPARISON:  Pelvis film 05/12/2018 FINDINGS: Sacroplasty cement noted in LEFT and RIGHT sacral ala. No evidence of sacral fracture. No displaced coccygeal fracture. Atherosclerotic calcification of the aorta. IMPRESSION: No evidence of acute sacral or coccygeal fracture. Electronically Signed   By: Suzy Bouchard M.D.   On: 06/25/2018 14:48   Dg Chest Port 1 View  Result Date: 06/25/2018 CLINICAL DATA:  [place] on Friday, LEFT hip pain, shortness of breath, difficulty walking, history coronary artery disease, COPD, paroxysmal atrial fibrillation, hypertension, diastolic dysfunction EXAM: PORTABLE CHEST 1 VIEW COMPARISON:  Portable exam 1614 hours compared to 05/02/2018 FINDINGS: Normal heart size, mediastinal contours, and pulmonary vascularity. Coronary arterial stent noted. Atherosclerotic calcification aorta. Bibasilar atelectasis greater on RIGHT. Lungs otherwise clear. Underlying emphysematous changes. No pleural effusion or pneumothorax. Bones demineralized with evidence of prior cervical spine surgery and RIGHT glenohumeral degenerative changes. IMPRESSION: COPD changes with  bibasilar  atelectasis greater on RIGHT. Electronically Signed   By: Lavonia Dana M.D.   On: 06/25/2018 16:31   Dg Hip Unilat With Pelvis 2-3 Views Left  Result Date: 06/25/2018 CLINICAL DATA:  [place] 2 days ago. Left groin pain. EXAM: DG HIP (WITH OR WITHOUT PELVIS) 2-3V LEFT COMPARISON:  None. FINDINGS: Bones are demineralized. SI joints and symphysis pubis unremarkable. Patient is status post bilateral sacral augmentation. Acute fractures noted in the left superior and inferior pubic rami. AP and frog-leg lateral views of the left hip show no femoral neck fracture. IMPRESSION: Acute fractures of the left superior and inferior pubic rami. No left femoral neck fracture. Electronically Signed   By: Misty Stanley M.D.   On: 06/25/2018 14:47   EKG: Independently reviewed. normal sinus rhythm, nonspecific ST and T waves changes.  Assessment/Plan 1.  Closed fracture of multiple pubic rami. Presents with a fall.  Left hip pain. X-ray shows evidence of 2 pubic rami. No hip fracture. Patient is unable to walk due to severe pain. We will admit the patient to the hospital for pain control. Due to her chronic kidney disease will use Dilaudid as well as continue to use Norco. No indication for surgery for now. PT OT consulted. Weightbearing as tolerated.  2.  COPD. Patient has bilateral expiratory wheezing. We will continue with nebulizing treatment. Continue home regimen for now.  3.  Recurrent fall. Etiology is not clear. Initially it was thought to be secondary to hypoxia. Currently polypharmacy may be playing a role. PT OT consulted. Will monitor.  4.  Chronic pain syndrome. Chronic anxiety. Patient is on Norco as well as Klonopin at home. Per pharmacy the bottles have been empty.  Patient denies any suicidal or homicidal ideation. We will monitor for now.  5. Acute on chronic kidney disease stage III. Monitor renal function. Continue Lasix. Patient follows up with Dr. [name]  outpatient.  6.  Essential hypertension. Continue home regimen for now.  7.  Hypothyroidism. Continue Synthroid.  Nutrition: Cardiac diet DVT Prophylaxis: subcutaneous Heparin  Advance goals of care discussion: Full code  Consults: none  Family Communication: family was present at bedside, at the time of interview.  Opportunity was given to ask question and all questions were answered satisfactorily.  Disposition: Admitted as observation, med-surge unit. Likely to be discharged home, in 1-2 days.  Author: Berle Mull, MD [facility] 06/25/2018  If 7PM-7AM, please contact night-coverage [email or web address]

## 2018-06-26 ENCOUNTER — Encounter (HOSPITAL_COMMUNITY): Payer: Self-pay | Admitting: General Practice

## 2018-06-26 ENCOUNTER — Other Ambulatory Visit: Payer: Self-pay

## 2018-06-26 ENCOUNTER — Inpatient Hospital Stay: Admission: RE | Admit: 2018-06-26 | Payer: Medicare Other | Source: Ambulatory Visit

## 2018-06-26 DIAGNOSIS — J9611 Chronic respiratory failure with hypoxia: Secondary | ICD-10-CM | POA: Diagnosis not present

## 2018-06-26 DIAGNOSIS — W19XXXA Unspecified fall, initial encounter: Secondary | ICD-10-CM

## 2018-06-26 DIAGNOSIS — S32592A Other specified fracture of left pubis, initial encounter for closed fracture: Secondary | ICD-10-CM | POA: Diagnosis not present

## 2018-06-26 LAB — CBC
HCT: 27.7 % — ABNORMAL LOW (ref 36.0–46.0)
Hemoglobin: 9 g/dL — ABNORMAL LOW (ref 12.0–15.0)
MCH: 34.7 pg — ABNORMAL HIGH (ref 26.0–34.0)
MCHC: 32.5 g/dL (ref 30.0–36.0)
MCV: 106.9 fL — ABNORMAL HIGH (ref 80.0–100.0)
Platelets: 192 10*3/uL (ref 150–400)
RBC: 2.59 MIL/uL — AB (ref 3.87–5.11)
RDW: 14.7 % (ref 11.5–15.5)
WBC: 5 10*3/uL (ref 4.0–10.5)
nRBC: 0 % (ref 0.0–0.2)

## 2018-06-26 LAB — BASIC METABOLIC PANEL
Anion gap: 14 (ref 5–15)
BUN: 22 mg/dL (ref 8–23)
CO2: 30 mmol/L (ref 22–32)
Calcium: 8.8 mg/dL — ABNORMAL LOW (ref 8.9–10.3)
Chloride: 98 mmol/L (ref 98–111)
Creatinine, Ser: 1.84 mg/dL — ABNORMAL HIGH (ref 0.44–1.00)
GFR calc Af Amer: 30 mL/min — ABNORMAL LOW (ref >60)
GFR calc non Af Amer: 26 mL/min — ABNORMAL LOW (ref >60)
Glucose, Bld: 110 mg/dL — ABNORMAL HIGH (ref 70–99)
Potassium: 3.2 mmol/L — ABNORMAL LOW (ref 3.5–5.1)
Sodium: 142 mmol/L (ref 135–145)

## 2018-06-26 MED ORDER — DIPHENHYDRAMINE HCL 25 MG PO CAPS
25.0000 mg | ORAL_CAPSULE | Freq: Once | ORAL | Status: AC
  Administered 2018-06-26: 25 mg via ORAL
  Filled 2018-06-26: qty 1

## 2018-06-26 NOTE — Progress Notes (Addendum)
Nutrition Brief Note  Received consult for assessment of nutrition status from the hip fracture order set/protocol. Patient reports weight gain due to fluids during hospitalization this past summer. Since discharge, she has lost back to her usual weight. Intake PTA was good. Currently has a decreased appetite, but is eating well.   Nutrition focused physical exam completed.  No muscle or subcutaneous fat depletion noticed. Mild edema present.  BMI = 28 (overweight)  Current diet order is heart healthy, patient is consuming approximately 75% of meals at this time. Labs and medications reviewed.   No nutrition interventions warranted at this time. If nutrition issues arise, please consult RD.    Molli Barrows, RD, LDN, [place] Pager [phone] After Hours Pager [phone]

## 2018-06-26 NOTE — Plan of Care (Signed)
Problem: Education: Goal: Knowledge of General Education information will improve Description Including pain rating scale, medication(s)/side effects and non-pharmacologic comfort measures Outcome: Progressing   Problem: Health Behavior/Discharge Planning: Goal: Ability to manage health-related needs will improve Outcome: Progressing   Problem: Activity: Goal: Risk for activity intolerance will decrease Outcome: Progressing   Problem: Nutrition: Goal: Adequate nutrition will be maintained Outcome: Progressing   Problem: Coping: Goal: Level of anxiety will decrease Outcome: Progressing   Problem: Elimination: Goal: Will not experience complications related to urinary retention Outcome: Progressing   Problem: Pain Managment: Goal: General experience of comfort will improve Outcome: Progressing   Problem: Safety: Goal: Ability to remain free from injury will improve Outcome: Progressing   Problem: Skin Integrity: Goal: Risk for impaired skin integrity will decrease Outcome: Progressing

## 2018-06-26 NOTE — Evaluation (Addendum)
Occupational Therapy Evaluation Patient Details Name: [name] MRN: [id number] DOB: 11-17-1940 Today's Date: 06/26/2018    History of Present Illness 77 y.o. female presenting with mechanical fall [place] the [place]. PMH including CAD S/P CABG, COPD, CKD, chronic pain syndrome, chronic anxiety, anemia of chronic kidney disease, chronic diastolic CHF, and PVD. Imaging showing closed fx of multiple pubic rami and no indication for sx per MD. WBAT.   Clinical Impression   PTA, pt was living with her significant other and was independent. Currently, pt requires Min A for LB ADLs and functional mobility using RW. Pt presenting with decreased activity tolerance due to pain at LLE and requires increased time for ADLs and functional mobility. Pt would benefit from further acute OT to facilitate safe dc. Recommend dc to SNF for further OT to optimize safety, independence with ADLs, and return to PLOF.      Follow Up Recommendations  SNF;Supervision/Assistance - 24 hour    Equipment Recommendations  None recommended by OT    Recommendations for Other Services PT consult     Precautions / Restrictions Restrictions Weight Bearing Restrictions: Yes RLE Weight Bearing: Weight bearing as tolerated LLE Weight Bearing: Weight bearing as tolerated      Mobility Bed Mobility Overal bed mobility: Needs Assistance Bed Mobility: Supine to Sit     Supine to sit: Mod assist;HOB elevated     General bed mobility comments: Mod A for managing LLE and assistance to elevated trunk. Use of pads to faciltiate hips towards EOB  Transfers Overall transfer level: Needs assistance Equipment used: Rolling walker (2 wheeled) Transfers: Sit to/from Stand Sit to Stand: Min assist;Mod assist         General transfer comment: cues for hand placement and Mod A to power up from EOB and Min A for power up from [facility]     Balance Overall balance assessment: Needs assistance Sitting-balance support: No  upper extremity supported;Feet supported Sitting balance-Leahy Scale: Fair     Standing balance support: Bilateral upper extremity supported;During functional activity Standing balance-Leahy Scale: Poor Standing balance comment: Reliant on UE support                           ADL either performed or assessed with clinical judgement   ADL Overall ADL's : Needs assistance/impaired Eating/Feeding: Independent;Sitting   Grooming: Set up;Supervision/safety;Sitting   Upper Body Bathing: Set up;Supervision/ safety;Sitting   Lower Body Bathing: Minimal assistance;Sit to/from stand   Upper Body Dressing : Set up;Supervision/safety;Sitting   Lower Body Dressing: Minimal assistance;Sit to/from stand Lower Body Dressing Details (indicate cue type and reason): Pt able to bend forward. Min A for dynamic standing balance Toilet Transfer: Minimal assistance;Stand-pivot;BSC;RW   Toileting- Clothing Manipulation and Hygiene: Minimal assistance;Sit to/from stand Toileting - Clothing Manipulation Details (indicate cue type and reason): Min A for standing balance and managing gown   Tub/Shower Transfer Details (indicate cue type and reason): Discussed use of BSC as a shower chair. Pt reports she doesn't into her tub for bathing Functional mobility during ADLs: Minimal assistance;Rolling walker General ADL Comments: Pt limited by pain at LLE. Moves slowly      Vision         Perception     Praxis      Pertinent Vitals/Pain Pain Assessment: 0-10 Pain Score: 8  Pain Location: LLE Pain Descriptors / Indicators: Constant;Discomfort;Grimacing Pain Intervention(s): Monitored during session;Limited activity within patient's tolerance;Repositioned     Hand Dominance  Right   Extremity/Trunk Assessment Upper Extremity Assessment Upper Extremity Assessment: Overall WFL for tasks assessed   Lower Extremity Assessment Lower Extremity Assessment: Defer to PT evaluation        Communication Communication Communication: No difficulties   Cognition Arousal/Alertness: Awake/alert Behavior During Therapy: WFL for tasks assessed/performed Overall Cognitive Status: Within Functional Limits for tasks assessed                                     General Comments  SpO2 ranging between 86-92% on 2L.    Exercises     Shoulder Instructions      Home Living Family/patient expects to be discharged to:: Private residence Living Arrangements: Spouse/significant other Available Help at Discharge: Friend(s);Available 24 hours/day(significant other) Type of Home: House(double wide) Home Access: Ramped entrance     Home Layout: One level     Bathroom Shower/Tub: Teacher, early years/pre: Standard     Home Equipment: Environmental consultant - 2 wheels;Cane - single point;Bedside commode;Wheelchair Copywriter, advertising)   Additional Comments: 2L home O2      Prior Functioning/Environment Level of Independence: Independent with assistive device(s)        Comments: Uses rollator for functional mobility. Furnature walks inside home. Performs ADLs and IADLs. does not drive        OT Problem List: Decreased range of motion;Decreased activity tolerance;Impaired balance (sitting and/or standing);Decreased knowledge of use of DME or AE;Decreased knowledge of precautions;Pain      OT Treatment/Interventions: Self-care/ADL training;Therapeutic exercise;DME and/or AE instruction;Energy conservation;Therapeutic activities;Patient/family education    OT Goals(Current goals can be found in the care plan section) Acute Rehab OT Goals Patient Stated Goal: "Go home for Christmas" OT Goal Formulation: With patient Time For Goal Achievement: 07/10/18 Potential to Achieve Goals: Good  OT Frequency: Min 2X/week   Barriers to D/C:            Co-evaluation              AM-PAC OT "6 Clicks" Daily Activity     Outcome Measure Help from another person eating  meals?: None Help from another person taking care of personal grooming?: A Little Help from another person toileting, which includes using toliet, bedpan, or urinal?: A Little Help from another person bathing (including washing, rinsing, drying)?: A Little Help from another person to put on and taking off regular upper body clothing?: None Help from another person to put on and taking off regular lower body clothing?: A Little 6 Click Score: 20   End of Session Equipment Utilized During Treatment: Gait belt;Rolling walker;Oxygen Nurse Communication: Mobility status;Weight bearing status  Activity Tolerance: Patient tolerated treatment well Patient left: in chair;with call bell/phone within reach;with chair alarm set  OT Visit Diagnosis: Unsteadiness on feet (R26.81);Other abnormalities of gait and mobility (R26.89);Muscle weakness (generalized) (M62.81);Pain Pain - Right/Left: Left Pain - part of body: Leg                Time: 0752-0828 OT Time Calculation (min): 36 min Charges:  OT General Charges $OT Visit: 1 Visit OT Evaluation $OT Eval Moderate Complexity: 1 Mod OT Treatments $Self Care/Home Management : 8-22 mins  Lauryn Lizardi MSOT, OTR/L Acute Rehab Pager: [phone] Office: [place] 06/26/2018, 8:36 AM

## 2018-06-26 NOTE — Evaluation (Signed)
Physical Therapy Evaluation Patient Details Name: [name] MRN: [id number] DOB: December 31, 1940 Today's Date: 06/26/2018   History of Present Illness  77 y.o. female presenting with mechanical [place] the [place]. PMH including CAD S/P CABG, COPD, CKD, chronic pain syndrome, chronic anxiety, anemia of chronic kidney disease, chronic diastolic CHF, and PVD. Imaging showing closed fx of multiple pubic rami and no indication for sx per MD. WBAT.  Clinical Impression  Pt admitted with above diagnosis. Pt currently with functional limitations due to the deficits listed below (see PT Problem List). Pt is min/ mod A with mobility but was only able to ambulate 18' with RW and min A before requesting to sit back down. Expect that she would make the most gains in a rehab setting for short term before going home. She is hesitant because of Christmas but encouraged her that she could go home at a higher functional level with more therapy. She is going to talk to family tonight.  Pt will benefit from skilled PT to increase their independence and safety with mobility to allow discharge to the venue listed below.       Follow Up Recommendations SNF    Equipment Recommendations  None recommended by PT    Recommendations for Other Services       Precautions / Restrictions Precautions Precautions: Fall Restrictions Weight Bearing Restrictions: Yes RLE Weight Bearing: Weight bearing as tolerated LLE Weight Bearing: Weight bearing as tolerated      Mobility  Bed Mobility Overal bed mobility: Needs Assistance Bed Mobility: Supine to Sit     Supine to sit: Mod assist     General bed mobility comments: mod HHA from flat bed, pt reached for therapist's hand for support  Transfers Overall transfer level: Needs assistance Equipment used: Rolling walker (2 wheeled) Transfers: Sit to/from Stand Sit to Stand: Min assist         General transfer comment: min A to  steady  Ambulation/Gait Ambulation/Gait assistance: Min Web designer (Feet): 18 Feet Assistive device: Rolling walker (2 wheeled) Gait Pattern/deviations: Step-through pattern;Shuffle;Decreased stride length;Trunk flexed Gait velocity: decreased Gait velocity interpretation: <1.31 ft/sec, indicative of household ambulator General Gait Details: pt encouraged to try to walk to hall but before she got to her forst doorway she felt that she could not go any further and needed to return to bed to sit  Stairs            Wheelchair Mobility    Modified Rankin (Stroke Patients Only)       Balance Overall balance assessment: Needs assistance Sitting-balance support: No upper extremity supported;Feet supported Sitting balance-Leahy Scale: Fair     Standing balance support: Bilateral upper extremity supported;During functional activity Standing balance-Leahy Scale: Poor Standing balance comment: Reliant on UE support                             Pertinent Vitals/Pain Pain Assessment: 0-10 Pain Score: 7  Pain Location: LLE Pain Descriptors / Indicators: Constant;Discomfort;Grimacing Pain Intervention(s): Limited activity within patient's tolerance;Monitored during session    [place] expects to be discharged to:: Private residence Living Arrangements: Non-relatives/Friends Available Help at Discharge: Family;Available 24 hours/day Type of Home: Mobile home Home Access: Ramped entrance     Home Layout: One level Home Equipment: [place] - 4 wheels;Cane - single point;Bedside commode;Wheelchair - manual Additional Comments: 2L home O2    Prior Function Level of Independence: Independent with assistive  device(s)         Comments: Uses rollator for functional mobility. Furnature walks inside home. Performs ADLs and IADLs. does not drive     Hand Dominance   Dominant Hand: Right    Extremity/Trunk Assessment   Upper Extremity  Assessment Upper Extremity Assessment: Defer to OT evaluation    Lower Extremity Assessment Lower Extremity Assessment: Generalized weakness    Cervical / Trunk Assessment Cervical / Trunk Assessment: Kyphotic  Communication   Communication: No difficulties  Cognition Arousal/Alertness: Awake/alert Behavior During Therapy: WFL for tasks assessed/performed Overall Cognitive Status: Within Functional Limits for tasks assessed                                        General Comments General comments (skin integrity, edema, etc.): SpO2 93% on 2L O2 after session, remained on 2L O2 throughout. Discussed that pt will need to mobilize regularly to get strength back but she reports she is more comfortable sitting. Would benefit from rehab setting for therapy. She is hesitant because of Christmas    Exercises     Assessment/Plan    PT Assessment Patient needs continued PT services  PT Problem List Decreased strength;Decreased activity tolerance;Decreased balance;Decreased mobility;Decreased knowledge of use of DME;Decreased knowledge of precautions;Pain       PT Treatment Interventions DME instruction;Gait training;Functional mobility training;Therapeutic activities;Therapeutic exercise;Balance training;Patient/family education    PT Goals (Current goals can be found in the Care Plan section)  Acute Rehab PT Goals Patient Stated Goal: "Go home for Christmas" PT Goal Formulation: With patient Time For Goal Achievement: 07/10/18 Potential to Achieve Goals: Good    Frequency Min 3X/week   Barriers to discharge Decreased caregiver support pt's significant other is independent but also elderly    Co-evaluation               AM-PAC PT "6 Clicks" Mobility  Outcome Measure Help needed turning from your back to your side while in a flat bed without using bedrails?: A Little Help needed moving from lying on your back to sitting on the side of a flat bed without  using bedrails?: A Lot Help needed moving to and from a bed to a chair (including a wheelchair)?: A Little Help needed standing up from a chair using your arms (e.g., wheelchair or bedside chair)?: A Lot Help needed to walk in hospital room?: A Little Help needed climbing 3-5 steps with a railing? : Total 6 Click Score: 14    End of Session Equipment Utilized During Treatment: Gait belt;Oxygen Activity Tolerance: Patient limited by fatigue;Patient limited by pain Patient left: in bed;with call bell/phone within reach Nurse Communication: Mobility status PT Visit Diagnosis: Unsteadiness on feet (R26.81);Muscle weakness (generalized) (M62.81);History of falling (Z91.81);Pain;Difficulty in walking, not elsewhere classified (R26.2) Pain - Right/Left: (both) Pain - part of body: Leg    Time: 7616-0737 PT Time Calculation (min) (ACUTE ONLY): 24 min   Charges:   PT Evaluation $PT Eval Moderate Complexity: 1 Mod PT Treatments $Gait Training: 8-22 mins        [place]  Pager [phone] Office [place] 06/26/2018, 1:37 PM

## 2018-06-26 NOTE — Progress Notes (Signed)
PROGRESS NOTE        PATIENT DETAILS Name: [name] Age: 77 y.o. Sex: female Date of Birth: [birth year] Admit Date: 06/25/2018 Admitting Physician Lavina [name], MD [id number], Richardson Landry, MD  Brief Narrative: Patient is a 77 y.o. female with history of COPD on home O2, status post CABG, CKD stage IV-presented to the hospital following a mechanical fall, she was found to have a pelvic fracture and was subsequently admitted to the hospitalist service.  See below for further details.  Subjective: Continues to have significant pain in the pelvic area.  Assessment/Plan: Left pelvic fracture: Secondary to mechanical fall, continues to have significant pain-evaluated by PT-recommendations of SNF.  Supportive care and as needed narcotics.  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  She has no rhonchi/wheezing on exam.  CAD: No anginal symptoms.  PAF: Reviewed most recent outpatient cardiology note-not on anticoagulation as risks of anticoagulation outweigh benefits due to recurrent falls.  Hypertension: Controlled-continue Imdur, Lasix  Chronic kidney disease stage IV: Creatinine close to usual baseline-follow periodically.  Hypothyroidism: Continue Synthroid  Anxiety: Appears to be stable-continue Klonopin  Chronic pain syndrome/back pain: Continue as needed narcotics.  Tobacco abuse: Counseled-appears that she has no intention of quitting at this point  DVT Prophylaxis: Prophylactic Heparin   Code Status: Full code   Family Communication: None at bedside  Disposition Plan: Remain inpatient- SNF on discharge  Antimicrobial agents: Anti-infectives (From admission, onward)   None      Procedures: None  CONSULTS:  None  Time spent: 25- minutes-Greater than 50% of this time was spent in counseling, explanation of diagnosis, planning of further management, and  coordination of care.  MEDICATIONS: Scheduled Meds: . calcitRIOL  0.5 mcg Oral QODAY  . clonazePAM  2 mg Oral QHS  . furosemide  40 mg Oral Daily  . heparin  5,000 Units Subcutaneous Q8H  . isosorbide mononitrate  60 mg Oral Daily  . levothyroxine  112 mcg Oral Q0600  . loratadine  10 mg Oral Daily  . methocarbamol  500 mg Oral TID  . mometasone-formoterol  2 puff Inhalation BID  . senna-docusate  1 tablet Oral BID   Continuous Infusions: PRN Meds:.albuterol, bisacodyl, HYDROcodone-acetaminophen, HYDROmorphone (DILAUDID) injection   PHYSICAL EXAM: Vital signs: Vitals:   06/25/18 2121 06/26/18 0017 06/26/18 0457 06/26/18 1437  BP: (!) 117/49 (!) 109/48 134/62 (!) 113/59  Pulse: 86 79 70 (!) 118  Resp:      Temp: 98.5 F (36.9 C) 97.9 F (36.6 C) 98.2 F (36.8 C) 97.9 F (36.6 C)  TempSrc: Oral Oral Oral Oral  SpO2: 95% 91% 93% 96%   There were no vitals filed for this visit. There is no height or weight on file to calculate BMI.   General appearance :Awake, alert, not in any distress.  Eyes:Pink conjunctiva HEENT: Atraumatic and Normocephalic Neck: supple Resp:Good air entry bilaterally, no added sounds  CVS: S1 S2 regular, no murmurs.  GI: Bowel sounds present, Non tender and not distended with no gaurding, rigidity or rebound.No organomegaly Extremities: B/L Lower Ext shows no edema, both legs are warm to touch Neurology:  speech clear,Non focal, sensation is grossly intact. Musculoskeletal:No digital cyanosis Skin:No Rash, warm and dry Wounds:N/A  I have personally reviewed following labs and imaging studies  LABORATORY DATA: CBC: Recent Labs  Lab 06/22/18 1310 06/25/18 1533 06/26/18 0220  WBC  --  5.6 5.0  NEUTROABS  --  3.7  --   HGB 11.0* 10.4* 9.0*  HCT  --  33.6* 27.7*  MCV  --  107.0* 106.9*  PLT  --  207 161    Basic Metabolic Panel: Recent Labs  Lab 06/25/18 1533 06/26/18 0220  NA 140 142  K 3.3* 3.2*  CL 98 98  CO2 31 30  GLUCOSE  101* 110*  BUN 22 22  CREATININE 1.82* 1.84*  CALCIUM 9.3 8.8*    GFR: Estimated Creatinine Clearance: 27.1 mL/min (A) (by C-G formula based on SCr of 1.84 mg/dL (H)).  Liver Function Tests: No results for input(s): AST, ALT, ALKPHOS, BILITOT, PROT, ALBUMIN in the last 168 hours. No results for input(s): LIPASE, AMYLASE in the last 168 hours. No results for input(s): AMMONIA in the last 168 hours.  Coagulation Profile: No results for input(s): INR, PROTIME in the last 168 hours.  Cardiac Enzymes: No results for input(s): CKTOTAL, CKMB, CKMBINDEX, TROPONINI in the last 168 hours.  BNP (last 3 results) No results for input(s): PROBNP in the last 8760 hours.  HbA1C: No results for input(s): HGBA1C in the last 72 hours.  CBG: No results for input(s): GLUCAP in the last 168 hours.  Lipid Profile: No results for input(s): CHOL, HDL, LDLCALC, TRIG, CHOLHDL, LDLDIRECT in the last 72 hours.  Thyroid Function Tests: No results for input(s): TSH, T4TOTAL, FREET4, T3FREE, THYROIDAB in the last 72 hours.  Anemia Panel: No results for input(s): VITAMINB12, FOLATE, FERRITIN, TIBC, IRON, RETICCTPCT in the last 72 hours.  Urine analysis:    Component Value Date/Time   COLORURINE YELLOW (A) 12/18/2017 2340   APPEARANCEUR CLOUDY (A) 12/18/2017 2340   LABSPEC 1.013 12/18/2017 2340   PHURINE 5.0 12/18/2017 2340   GLUCOSEU NEGATIVE 12/18/2017 2340   HGBUR NEGATIVE 12/18/2017 2340   BILIRUBINUR NEGATIVE 12/18/2017 2340   KETONESUR NEGATIVE 12/18/2017 2340   PROTEINUR NEGATIVE 12/18/2017 2340   NITRITE NEGATIVE 12/18/2017 2340   LEUKOCYTESUR LARGE (A) 12/18/2017 2340    Sepsis Labs: Lactic Acid, Venous No results found for: LATICACIDVEN  MICROBIOLOGY: No results found for this or any previous visit (from the past 240 hour(s)).  RADIOLOGY STUDIES/RESULTS: Dg Lumbar Spine Complete  Result Date: 06/25/2018 CLINICAL DATA:  Fall, back pain EXAM: LUMBAR SPINE - COMPLETE 4+ VIEW  COMPARISON:  Lumbar MRI 05/19/2018 FINDINGS: Normal alignment of vertebral bodies. Loss of vertebral height in the L1 vertebral body similar comparison MRI. There is osteophytosis at this level. No acute loss vertebral body height or disc height. No pars fracture. No subluxation. Atherosclerotic calcification of the aorta. IMPRESSION: 1. No acute findings of the spine. 2. 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  Atherosclerotic calcification of the aorta. Electronically Signed   By: Suzy Bouchard M.D.   On: 06/25/2018 14:52   Dg Sacrum/coccyx  Result Date: 06/25/2018 CLINICAL DATA:  Fall, sacral pain.  Recent sacral plasty EXAM: SACRUM AND COCCYX - 2+ VIEW COMPARISON:  Pelvis film 05/12/2018 FINDINGS: Sacroplasty cement noted in LEFT and RIGHT sacral ala. No evidence of sacral fracture. No displaced coccygeal fracture. Atherosclerotic calcification of the aorta. IMPRESSION: No evidence of acute sacral or coccygeal fracture. Electronically Signed   By: Suzy Bouchard M.D.   On: 06/25/2018 14:48   Dg Chest Port 1 View  Result Date: 06/25/2018 CLINICAL DATA:  [place] on Friday, LEFT hip pain, shortness of  breath, difficulty walking, history coronary artery disease, COPD, paroxysmal atrial fibrillation, hypertension, diastolic dysfunction EXAM: PORTABLE CHEST 1 VIEW COMPARISON:  Portable exam 1614 hours compared to 05/02/2018 FINDINGS: Normal heart size, mediastinal contours, and pulmonary vascularity. Coronary arterial stent noted. Atherosclerotic calcification aorta. Bibasilar atelectasis greater on RIGHT. Lungs otherwise clear. Underlying emphysematous changes. No pleural effusion or pneumothorax. Bones demineralized with evidence of prior cervical spine surgery and RIGHT glenohumeral degenerative changes. IMPRESSION: COPD changes with bibasilar atelectasis greater on RIGHT. Electronically Signed   By: Lavonia Dana M.D.   On: 06/25/2018 16:31   Ir Sacroplasty  Bilateral  Result Date: 06/14/2018 INDICATION: Severe low back pain secondary to sacral insufficiency fractures. EXAM: SACROPLASTY BILATERAL APPROACH MEDICATIONS: As antibiotic prophylaxis, 1 g of vancomycin was ordered pre-procedure and administered intravenously within 1 hour of incision. ANESTHESIA/SEDATION: Moderate (conscious) sedation was employed during this procedure. A total of Versed 1 mg and Fentanyl 25 mcg was administered intravenously. Moderate Sedation Time: 33 minutes. The patient's level of consciousness and vital signs were monitored continuously by radiology nursing throughout the procedure under my direct supervision. FLUOROSCOPY TIME:  Fluoroscopy Time: 10 minutes 24 seconds (3016 mGy) COMPLICATIONS: None immediate. TECHNIQUE: Informed written consent was obtained from the patient after a thorough discussion of the procedural risks, benefits and alternatives. All questions were addressed. Maximal Sterile Barrier Technique was utilized including caps, mask, sterile gowns, sterile gloves, sterile drape, hand hygiene and skin antiseptic. A timeout was performed prior to the initiation of the procedure. PROCEDURE: The patient was placed prone on the fluoroscopic table. Nasal oxygen was administered. Physiologic monitoring was performed throughout the duration of the procedure. The skin overlying the lumbosacral region was prepped and draped in the usual sterile fashion. The sacral ala were identified and the skin overlying the sacral ala was infiltrated with 0.25% Bupivacaine. This was then followed by the advancement of a 13-gauge Cook needle through the sacral ala and into the anterior 1/3. A gentle contrast injection through the needles demonstrated early opacification of presacral veins on the right side. This prompted the use of Gelfoam pledgets into the 13 gauge Cook spinal needle on the right. At this time, methylmethacrylate mixture was reconstituted. Under biplane intermittent  fluoroscopy, the methylmethacrylate was then injected into the sacral ala with filling of the S1 S2 vertebral body. No extravasation was noted into the disk spaces or posteriorly into the spinal canal. There was slight extrusion of the methylmethacrylate mixture along the superior aspect of the sacrum anteriorly. The needles were then removed. Hemostasis was achieved at the skin entry sites. There were no acute complications. Patient tolerated the procedure well. The patient was observed for 3 hours and discharged in good condition. IMPRESSION: 1. Status post vertebral body augmentation at S1-S2 junction for painful sacral insufficiency fractures using the sacroplasty technique. Electronically Signed   By: Luanne Bras M.D.   On: 06/13/2018 14:19   Dg Hip Unilat With Pelvis 2-3 Views Left  Result Date: 06/25/2018 CLINICAL DATA:  [place] 2 days ago. Left groin pain. EXAM: DG HIP (WITH OR WITHOUT PELVIS) 2-3V LEFT COMPARISON:  None. FINDINGS: Bones are demineralized. SI joints and symphysis pubis unremarkable. Patient is status post bilateral sacral augmentation. Acute fractures noted in the left superior and inferior pubic rami. AP and frog-leg lateral views of the left hip show no femoral neck fracture. IMPRESSION: Acute fractures of the left superior and inferior pubic rami. No left femoral neck fracture. Electronically Signed   By: Verda Cumins.D.  On: 06/25/2018 14:47     LOS: 0 days   Oren Binet, MD  [facility]  If 7PM-7AM, please contact night-coverage  Please page via [email or web address]-Password [email or web address]-click on MD name and type text message  06/26/2018, 2:53 PM

## 2018-06-27 DIAGNOSIS — J9611 Chronic respiratory failure with hypoxia: Secondary | ICD-10-CM | POA: Diagnosis not present

## 2018-06-27 DIAGNOSIS — S32592A Other specified fracture of left pubis, initial encounter for closed fracture: Secondary | ICD-10-CM | POA: Diagnosis not present

## 2018-06-27 MED ORDER — POTASSIUM CHLORIDE CRYS ER 20 MEQ PO TBCR
40.0000 meq | EXTENDED_RELEASE_TABLET | Freq: Once | ORAL | Status: AC
  Administered 2018-06-27: 40 meq via ORAL
  Filled 2018-06-27: qty 2

## 2018-06-27 NOTE — Progress Notes (Signed)
PROGRESS NOTE        PATIENT DETAILS Name: [name] Age: 77 y.o. Sex: female Date of Birth: 1941/03/28 Admit Date: 06/25/2018 Admitting Physician Lavina [name], MD [id number], Richardson Landry, MD  Brief Narrative: Patient is a 77 y.o. female with history of COPD on home O2, status post CABG, CKD stage IV-presented to the hospital following a mechanical fall, she was found to have a pelvic fracture and was subsequently admitted to the hospitalist service.  See below for further details.  Subjective: Continues to have pain in the pelvic area-difficult for her to ambulate.  Assessment/Plan: Left pelvic fracture: Secondary to a mechanical fall-still with significant pain-continue as needed narcotics.  Evaluated by PT-recommendations of SNF.  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  Continue bronchodilators and oxygen as needed.  CAD: Stable-no anginal symptoms.  PAF: Reviewed most recent outpatient cardiology note-not on anticoagulation as risks of anticoagulation outweigh benefits due to recurrent falls.  Hypertension: Controlled-continue Imdur and Lasix.  Hypokalemia: Replete and recheck.  Chronic kidney disease stage IV: Creatinine close to usual baseline-follow.  Hypothyroidism: Continue Synthroid  Anxiety: Stable-slightly anxious due to pain-and due to plan of being discharged to SNF-continue Klonopin.  Chronic pain syndrome/back pain: Continue as needed narcotics.  Tobacco abuse: Counseled-appears that she has no intention of quitting at this point  DVT Prophylaxis: Prophylactic Heparin   Code Status: Full code   Family Communication: None at bedside  Disposition Plan: Remain inpatient- SNF on discharge  Antimicrobial agents: Anti-infectives (From admission,  onward)   None      Procedures: None  CONSULTS:  None  Time spent: 25- minutes-Greater than 50% of this time was spent in counseling, explanation of diagnosis, planning of further management, and coordination of care.  MEDICATIONS: Scheduled Meds: . calcitRIOL  0.5 mcg Oral QODAY  . clonazePAM  2 mg Oral QHS  . furosemide  40 mg Oral Daily  . heparin  5,000 Units Subcutaneous Q8H  . isosorbide mononitrate  60 mg Oral Daily  . levothyroxine  112 mcg Oral Q0600  . loratadine  10 mg Oral Daily  . methocarbamol  500 mg Oral TID  . mometasone-formoterol  2 puff Inhalation BID  . senna-docusate  1 tablet Oral BID   Continuous Infusions: PRN Meds:.albuterol, bisacodyl, HYDROcodone-acetaminophen, HYDROmorphone (DILAUDID) injection   PHYSICAL EXAM: Vital signs: Vitals:   06/26/18 1437 06/26/18 2005 06/26/18 2105 06/27/18 0210  BP: (!) 113/59 (!) 118/57  (!) 118/43  Pulse: (!) 118 90  78  Resp:  18  18  Temp: 97.9 F (36.6 C) 98.7 F (37.1 C)    TempSrc: Oral Oral    SpO2: 96% 96% 96% 94%  Weight:      Height:       Filed Weights   06/26/18 0948  Weight: 78.9 kg   Body mass index is 28.08 kg/m.   General appearance:Awake, alert, not in any distress.  Eyes:no scleral icterus. HEENT: Atraumatic and Normocephalic Neck: supple, no JVD. Resp:Good air entry bilaterally,no rales or rhonchi CVS: S1 S2 regular, no murmurs.  GI: Bowel sounds present, Non tender and not distended with no gaurding, rigidity or rebound. Extremities: B/L Lower Ext shows no edema, both legs are warm to touch Neurology:  Non  focal Psychiatric: Normal judgment and insight. Normal mood. Musculoskeletal:No digital cyanosis Skin:No Rash, warm and dry Wounds:N/A  I have personally reviewed following labs and imaging studies  LABORATORY DATA: CBC: Recent Labs  Lab 06/22/18 1310 06/25/18 1533 06/26/18 0220  WBC  --  5.6 5.0  NEUTROABS  --  3.7  --   HGB 11.0* 10.4* 9.0*  HCT  --  33.6*  27.7*  MCV  --  107.0* 106.9*  PLT  --  207 063    Basic Metabolic Panel: Recent Labs  Lab 06/25/18 1533 06/26/18 0220  NA 140 142  K 3.3* 3.2*  CL 98 98  CO2 31 30  GLUCOSE 101* 110*  BUN 22 22  CREATININE 1.82* 1.84*  CALCIUM 9.3 8.8*    GFR: Estimated Creatinine Clearance: 27.1 mL/min (A) (by C-G formula based on SCr of 1.84 mg/dL (H)).  Liver Function Tests: No results for input(s): AST, ALT, ALKPHOS, BILITOT, PROT, ALBUMIN in the last 168 hours. No results for input(s): LIPASE, AMYLASE in the last 168 hours. No results for input(s): AMMONIA in the last 168 hours.  Coagulation Profile: No results for input(s): INR, PROTIME in the last 168 hours.  Cardiac Enzymes: No results for input(s): CKTOTAL, CKMB, CKMBINDEX, TROPONINI in the last 168 hours.  BNP (last 3 results) No results for input(s): PROBNP in the last 8760 hours.  HbA1C: No results for input(s): HGBA1C in the last 72 hours.  CBG: No results for input(s): GLUCAP in the last 168 hours.  Lipid Profile: No results for input(s): CHOL, HDL, LDLCALC, TRIG, CHOLHDL, LDLDIRECT in the last 72 hours.  Thyroid Function Tests: No results for input(s): TSH, T4TOTAL, FREET4, T3FREE, THYROIDAB in the last 72 hours.  Anemia Panel: No results for input(s): VITAMINB12, FOLATE, FERRITIN, TIBC, IRON, RETICCTPCT in the last 72 hours.  Urine analysis:    Component Value Date/Time   COLORURINE YELLOW (A) 12/18/2017 2340   APPEARANCEUR CLOUDY (A) 12/18/2017 2340   LABSPEC 1.013 12/18/2017 2340   PHURINE 5.0 12/18/2017 2340   GLUCOSEU NEGATIVE 12/18/2017 2340   HGBUR NEGATIVE 12/18/2017 2340   BILIRUBINUR NEGATIVE 12/18/2017 2340   KETONESUR NEGATIVE 12/18/2017 2340   PROTEINUR NEGATIVE 12/18/2017 2340   NITRITE NEGATIVE 12/18/2017 2340   LEUKOCYTESUR LARGE (A) 12/18/2017 2340    Sepsis Labs: Lactic Acid, Venous No results found for: LATICACIDVEN  MICROBIOLOGY: No results found for this or any previous  visit (from the past 240 hour(s)).  RADIOLOGY STUDIES/RESULTS: Dg Lumbar Spine Complete  Result Date: 06/25/2018 CLINICAL DATA:  Fall, back pain EXAM: LUMBAR SPINE - COMPLETE 4+ VIEW COMPARISON:  Lumbar MRI 05/19/2018 FINDINGS: Normal alignment of vertebral bodies. Loss of vertebral height in the L1 vertebral body similar comparison MRI. There is osteophytosis at this level. No acute loss vertebral body height or disc height. No pars fracture. No subluxation. Atherosclerotic calcification of the aorta. IMPRESSION: 1. No acute findings of the spine. 2. 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  Atherosclerotic calcification of the aorta. Electronically Signed   By: Suzy Bouchard M.D.   On: 06/25/2018 14:52   Dg Sacrum/coccyx  Result Date: 06/25/2018 CLINICAL DATA:  Fall, sacral pain.  Recent sacral plasty EXAM: SACRUM AND COCCYX - 2+ VIEW COMPARISON:  Pelvis film 05/12/2018 FINDINGS: Sacroplasty cement noted in LEFT and RIGHT sacral ala. No evidence of sacral fracture. No displaced coccygeal fracture. Atherosclerotic calcification of the aorta. IMPRESSION: No evidence of acute sacral or coccygeal fracture. Electronically Signed  By: Suzy Bouchard M.D.   On: 06/25/2018 14:48   Dg Chest Port 1 View  Result Date: 06/25/2018 CLINICAL DATA:  [place] on Friday, LEFT hip pain, shortness of breath, difficulty walking, history coronary artery disease, COPD, paroxysmal atrial fibrillation, hypertension, diastolic dysfunction EXAM: PORTABLE CHEST 1 VIEW COMPARISON:  Portable exam 1614 hours compared to 05/02/2018 FINDINGS: Normal heart size, mediastinal contours, and pulmonary vascularity. Coronary arterial stent noted. Atherosclerotic calcification aorta. Bibasilar atelectasis greater on RIGHT. Lungs otherwise clear. Underlying emphysematous changes. No pleural effusion or pneumothorax. Bones demineralized with evidence of prior cervical spine surgery and RIGHT  glenohumeral degenerative changes. IMPRESSION: COPD changes with bibasilar atelectasis greater on RIGHT. Electronically Signed   By: Lavonia Dana M.D.   On: 06/25/2018 16:31   Ir Sacroplasty Bilateral  Result Date: 06/14/2018 INDICATION: Severe low back pain secondary to sacral insufficiency fractures. EXAM: SACROPLASTY BILATERAL APPROACH MEDICATIONS: As antibiotic prophylaxis, 1 g of vancomycin was ordered pre-procedure and administered intravenously within 1 hour of incision. ANESTHESIA/SEDATION: Moderate (conscious) sedation was employed during this procedure. A total of Versed 1 mg and Fentanyl 25 mcg was administered intravenously. Moderate Sedation Time: 33 minutes. The patient's level of consciousness and vital signs were monitored continuously by radiology nursing throughout the procedure under my direct supervision. FLUOROSCOPY TIME:  Fluoroscopy Time: 10 minutes 24 seconds (1025 mGy) COMPLICATIONS: None immediate. TECHNIQUE: Informed written consent was obtained from the patient after a thorough discussion of the procedural risks, benefits and alternatives. All questions were addressed. Maximal Sterile Barrier Technique was utilized including caps, mask, sterile gowns, sterile gloves, sterile drape, hand hygiene and skin antiseptic. A timeout was performed prior to the initiation of the procedure. PROCEDURE: The patient was placed prone on the fluoroscopic table. Nasal oxygen was administered. Physiologic monitoring was performed throughout the duration of the procedure. The skin overlying the lumbosacral region was prepped and draped in the usual sterile fashion. The sacral ala were identified and the skin overlying the sacral ala was infiltrated with 0.25% Bupivacaine. This was then followed by the advancement of a 13-gauge Cook needle through the sacral ala and into the anterior 1/3. A gentle contrast injection through the needles demonstrated early opacification of presacral veins on the right  side. This prompted the use of Gelfoam pledgets into the 13 gauge Cook spinal needle on the right. At this time, methylmethacrylate mixture was reconstituted. Under biplane intermittent fluoroscopy, the methylmethacrylate was then injected into the sacral ala with filling of the S1 S2 vertebral body. No extravasation was noted into the disk spaces or posteriorly into the spinal canal. There was slight extrusion of the methylmethacrylate mixture along the superior aspect of the sacrum anteriorly. The needles were then removed. Hemostasis was achieved at the skin entry sites. There were no acute complications. Patient tolerated the procedure well. The patient was observed for 3 hours and discharged in good condition. IMPRESSION: 1. Status post vertebral body augmentation at S1-S2 junction for painful sacral insufficiency fractures using the sacroplasty technique. Electronically Signed   By: Luanne Bras M.D.   On: 06/13/2018 14:19   Dg Hip Unilat With Pelvis 2-3 Views Left  Result Date: 06/25/2018 CLINICAL DATA:  [place] 2 days ago. Left groin pain. EXAM: DG HIP (WITH OR WITHOUT PELVIS) 2-3V LEFT COMPARISON:  None. FINDINGS: Bones are demineralized. SI joints and symphysis pubis unremarkable. Patient is status post bilateral sacral augmentation. Acute fractures noted in the left superior and inferior pubic rami. AP and frog-leg lateral views of the left hip  show no femoral neck fracture. IMPRESSION: Acute fractures of the left superior and inferior pubic rami. No left femoral neck fracture. Electronically Signed   By: Misty Stanley M.D.   On: 06/25/2018 14:47     LOS: 0 days   Oren Binet, MD  [facility]  If 7PM-7AM, please contact night-coverage  Please page via [email or web address] [email or web address]-click on MD name and type text message  06/27/2018, 11:13 AM

## 2018-06-27 NOTE — Progress Notes (Signed)
Physical Therapy Treatment Patient Details Name: [name] MRN: [id number] DOB: January 25, 1941 Today's Date: 06/27/2018    History of Present Illness 77 y.o. female presenting with mechanical fall [place] the bank. PMH including CAD S/P CABG, COPD, CKD, chronic pain syndrome, chronic anxiety, anemia of chronic kidney disease, chronic diastolic CHF, and PVD. Imaging showing closed fx of multiple pubic rami and no indication for sx per MD. WBAT.    PT Comments    Pt fatigues very quickly with all activity. Was unable to ambulate further than 15' with RW. Worked on transfers and there ex. Pt had O2 off upon my arrival and SpO2 was 77%. On 2L she remained in low 90's. Continue to recommend SNF at d/c, pt agreeable today. PT will continue to follow.    Follow Up Recommendations  SNF     Equipment Recommendations  None recommended by PT    Recommendations for Other Services       Precautions / Restrictions Precautions Precautions: Fall Restrictions Weight Bearing Restrictions: Yes RLE Weight Bearing: Weight bearing as tolerated LLE Weight Bearing: Weight bearing as tolerated    Mobility  Bed Mobility Overal bed mobility: Needs Assistance Bed Mobility: Supine to Sit     Supine to sit: Supervision     General bed mobility comments: supervision from bed flat with use of rail  Transfers Overall transfer level: Needs assistance Equipment used: Rolling walker (2 wheeled) Transfers: Sit to/from Stand Sit to Stand: Min guard         General transfer comment: vc's for hand placement, increased time needed  Ambulation/Gait Ambulation/Gait assistance: Min assist Gait Distance (Feet): 15 Feet Assistive device: Rolling walker (2 wheeled) Gait Pattern/deviations: Step-through pattern;Shuffle;Decreased stride length;Trunk flexed Gait velocity: decreased Gait velocity interpretation: <1.31 ft/sec, indicative of household ambulator General Gait Details: pt painful and slow and  fatigues very quickly with gait. O2 sats low 90's on 2L O2   Stairs             Wheelchair Mobility    Modified Rankin (Stroke Patients Only)       Balance Overall balance assessment: Needs assistance Sitting-balance support: No upper extremity supported;Feet supported Sitting balance-Leahy Scale: Fair     Standing balance support: Bilateral upper extremity supported;During functional activity Standing balance-Leahy Scale: Poor Standing balance comment: Reliant on UE support                            Cognition Arousal/Alertness: Awake/alert Behavior During Therapy: WFL for tasks assessed/performed Overall Cognitive Status: Within Functional Limits for tasks assessed                                        Exercises General Exercises - Lower Extremity Ankle Circles/Pumps: AROM;Both;10 reps;Supine Quad Sets: AROM;Both;10 reps;Supine Gluteal Sets: AROM;Both;10 reps;Supine Heel Slides: AROM;Both;10 reps;Supine Hip Flexion/Marching: AROM;Standing;10 reps    General Comments General comments (skin integrity, edema, etc.): SpO2 77% upon PT entry to room with pt supine in bed and nasal cannula on pt's forehead. Returned to 90's once O2 back on      Pertinent Vitals/Pain Pain Assessment: Faces Pain Score: 10-Worst pain ever Pain Location: LLE Pain Descriptors / Indicators: Constant;Discomfort;Grimacing Pain Intervention(s): Limited activity within patient's tolerance;Monitored during session    Home Living  Prior Function            PT Goals (current goals can now be found in the care plan section) Acute Rehab PT Goals Patient Stated Goal: "Go home for Christmas" PT Goal Formulation: With patient Time For Goal Achievement: 07/10/18 Potential to Achieve Goals: Good    Frequency    Min 3X/week      PT Plan      Co-evaluation              AM-PAC PT "6 Clicks" Mobility   Outcome  Measure  Help needed turning from your back to your side while in a flat bed without using bedrails?: A Little Help needed moving from lying on your back to sitting on the side of a flat bed without using bedrails?: A Lot Help needed moving to and from a bed to a chair (including a wheelchair)?: A Little Help needed standing up from a chair using your arms (e.g., wheelchair or bedside chair)?: A Lot Help needed to walk in hospital room?: A Little Help needed climbing 3-5 steps with a railing? : Total 6 Click Score: 14    End of Session Equipment Utilized During Treatment: Gait belt;Oxygen Activity Tolerance: Patient limited by fatigue;Patient limited by pain Patient left: in bed;with call bell/phone within reach Nurse Communication: Mobility status PT Visit Diagnosis: Unsteadiness on feet (R26.81);Muscle weakness (generalized) (M62.81);History of falling (Z91.81);Pain;Difficulty in walking, not elsewhere classified (R26.2) Pain - Right/Left: (both) Pain - part of body: Leg     Time: 1032-1100 PT Time Calculation (min) (ACUTE ONLY): 28 min  Charges:  $Gait Training: 8-22 mins $Therapeutic Exercise: 8-22 mins                     Leighton Roach, [place]  Pager [phone] Office [place] 06/27/2018, 11:04 AM

## 2018-06-28 DIAGNOSIS — S32592A Other specified fracture of left pubis, initial encounter for closed fracture: Secondary | ICD-10-CM | POA: Diagnosis not present

## 2018-06-28 LAB — BASIC METABOLIC PANEL
Anion gap: 9 (ref 5–15)
BUN: 18 mg/dL (ref 8–23)
CO2: 32 mmol/L (ref 22–32)
Calcium: 9.1 mg/dL (ref 8.9–10.3)
Chloride: 99 mmol/L (ref 98–111)
Creatinine, Ser: 1.61 mg/dL — ABNORMAL HIGH (ref 0.44–1.00)
GFR calc Af Amer: 35 mL/min — ABNORMAL LOW (ref >60)
GFR calc non Af Amer: 31 mL/min — ABNORMAL LOW (ref >60)
Glucose, Bld: 100 mg/dL — ABNORMAL HIGH (ref 70–99)
Potassium: 3.6 mmol/L (ref 3.5–5.1)
Sodium: 140 mmol/L (ref 135–145)

## 2018-06-28 NOTE — Clinical Social Work Note (Signed)
Clinical Social Work Assessment  Patient Details  Name: [name] MRN: [id number] Date of Birth: 04-11-1941  Date of referral:  06/28/18               Reason for consult:  Facility Placement                Permission sought to share information with:  Facility Sport and exercise psychologist Permission granted to share information::  Yes, Verbal Permission Granted  Name::     Ernie Avena  Agency::  SNFs  Relationship::  friend  Contact Information:  [phone]  Housing/Transportation Living arrangements for the past 2 months:  Mobile Home Source of Information:  Patient Patient Interpreter Needed:  None Criminal Activity/Legal Involvement Pertinent to Current Situation/Hospitalization:  No - Comment as needed 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  No Need for family participation in patient care:  Yes (Comment)  Care giving concerns:  CSW received consult for discharge needs. CSW spoke with patient regarding PT recommendation of SNF placement at time of discharge. Patient reports she lives in a mobile home with her friend, Ernie Avena. She has a ramp leading to enter the home. Patient wants to increase and strengthen her mobility .    Social Worker assessment / plan:  CSW spoke with patient concerning possibility of rehab at [facility] before returning home. Patient agrees with the recommendation of SNF placement for short term rehab.   Employment status:  Other (Comment) Insurance information:  Medicare PT Recommendations:  [place] / Referral to community resources:  [place]  Patient/Family's Response to care: Patient recognizes need for rehab before returning home and is agreeable to a SNF placement. Patient reported preference for [facility] or [facility] in [place], [place]. Patient states she was not sure if [facility]  offer rehab. CSW provided med.gov SNF listing for patient to review and advise CSW of other possible placement options.    Patient/Family's Understanding of and Emotional Response to Diagnosis, Current Treatment, and Prognosis:  Patient is realistic regarding therapy needs and expressed being hopeful for SNF placement. Patient expressed understanding of CSW role and discharge process as well as medical condition. No questions/concerns about plan or treatment.    Emotional Assessment Appearance:  Appears stated age Attitude/Demeanor/Rapport:  Engaged Affect (typically observed):  Appropriate, Accepting Orientation:  Oriented to Self, Oriented to Place, Oriented to  Time, Oriented to Situation Alcohol / Substance use:  Not Applicable Psych involvement (Current and /or in the community):  No (Comment)  Discharge Needs  Concerns to be addressed:  Care Coordination Readmission within the last 30 days:  No Current discharge risk:  Dependent with Mobility Barriers to Discharge:  Continued Medical Work up   [employer], [place] 06/28/2018, 12:38 PM

## 2018-06-28 NOTE — NC FL2 (Signed)
[place] MEDICAID FL2 LEVEL OF CARE SCREENING TOOL     IDENTIFICATION  Patient Name: [name] Birthdate: 16-Aug-1940 Sex: female Admission Date (Current Location): 06/25/2018  [facility] and [place] Number:  Herbalist and Address:  [facility], [place] [address] [place], [place] [address]      Provider Number: [id number]  Attending Physician Name and Address:  Lady Deutscher, MD  Relative Name and Phone Number:  Ernie Avena (friend) [phone]    Current Level of Care: Hospital Recommended Level of Care: [place] Prior Approval Number:    Date Approved/Denied:   PASRR Number: [id number]  Discharge Plan: SNF    Current Diagnoses: Patient Active Problem List   Diagnosis Date Noted  . Closed fracture of multiple pubic rami ([place]) 06/25/2018  . ARF (acute renal failure) ([place]) 01/02/2018  . AKI (acute kidney injury) ([place]) 12/19/2017  . COPD (chronic obstructive pulmonary disease) ([place]) 07/26/2017  . Chronic renal impairment, stage 3 (moderate) ([place]) 07/19/2016  . Back pain 08/01/2015  . Bradycardia 08/01/2015  . Hypothyroidism 01/27/2015  . HTN (hypertension) 01/27/2015  . Neck pain 01/18/2014  . Paroxysmal atrial fibrillation ([place]) 03/26/2013  . Fatigue 03/26/2013  . SOB (shortness of breath) 03/26/2013  . Chest discomfort 03/26/2013  . CAD (coronary artery disease) 03/26/2013  . Smoker 03/26/2013  . Hyperlipidemia 03/26/2013    Orientation RESPIRATION BLADDER Height & Weight     Self, Time, Situation, Place  O2(nasal cannula 2 (L/min)) Continent Weight: 173 lb 15.1 oz (78.9 kg) Height:  5\' 6"  (167.6 cm)  BEHAVIORAL SYMPTOMS/MOOD NEUROLOGICAL BOWEL NUTRITION STATUS      Continent Diet(please see discharge summary)  AMBULATORY STATUS COMMUNICATION OF NEEDS Skin     Verbally Surgical wounds(incision(closed) Sacrum;posterior, incision(closed) leg; left)                       Personal Care  Assistance Level of Assistance  Bathing, Feeding, Dressing Bathing Assistance: Limited assistance Feeding assistance: Independent Dressing Assistance: Limited assistance     Functional Limitations Info  Sight, Hearing, Speech Sight Info: Adequate Hearing Info: Adequate Speech Info: Adequate    SPECIAL CARE FACTORS FREQUENCY  PT (By licensed PT), OT (By licensed OT)     PT Frequency: 5x per wk OT Frequency: 5x per week            Contractures Contractures Info: Not present    Additional Factors Info  Code Status, Allergies Code Status Info: Full Code Allergies Info: Ciprofloxacin, Penicillins, Amlodipine, Morphine And Related, Cefaclor , Codeine, Lipitor atorvastatin, Prednisone, Sulfa Antibiotics           Current Medications (06/28/2018):  This is the current hospital active medication list Current Facility-Administered Medications  Medication Dose Route Frequency Provider Last Rate Last Dose  . albuterol (PROVENTIL) (2.5 MG/3ML) 0.083% nebulizer solution 2.5 mg  2.5 mg Nebulization Q6H PRN Lavina [name], MD      . bisacodyl (DULCOLAX) suppository 10 mg  10 mg Rectal Daily PRN Lavina [name], MD      . calcitRIOL (ROCALTROL) capsule 0.5 mcg  0.5 mcg Oral Ervin Knack, MD   0.5 mcg at 06/27/18 0855  . clonazePAM (KLONOPIN) tablet 2 mg  2 mg Oral QHS Lavina [name], MD   2 mg at 06/27/18 2131  . furosemide (LASIX) tablet 40 mg  40 mg Oral Daily Lavina [name], MD   40 mg at 06/28/18 1009  .  heparin injection 5,000 Units  5,000 Units Subcutaneous Q8H Lavina [name], MD   5,000 Units at 06/28/18 [phone]  . HYDROcodone-acetaminophen (NORCO/VICODIN) 5-325 MG per tablet 1 tablet  1 tablet Oral Q4H PRN Lavina [name], MD   1 tablet at 06/28/18 0037  . HYDROmorphone (DILAUDID) injection 0.5 mg  0.5 mg Intravenous Q4H PRN Lavina [name], MD      . isosorbide mononitrate (IMDUR) 24 hr tablet 60 mg  60 mg Oral Daily Lavina [name], MD   60 mg at 06/28/18 1001  .  levothyroxine (SYNTHROID, LEVOTHROID) tablet 112 mcg  112 mcg Oral Q0600 Lavina [name], MD   112 mcg at 06/28/18 4920  . loratadine (CLARITIN) tablet 10 mg  10 mg Oral Daily Lavina [name], MD   10 mg at 06/28/18 1001  . methocarbamol (ROBAXIN) tablet 500 mg  500 mg Oral TID Lavina [name], MD   500 mg at 06/28/18 1002  . mometasone-formoterol (DULERA) 200-5 MCG/ACT inhaler 2 puff  2 puff Inhalation BID Lavina [name], MD   2 puff at 06/28/18 0900  . senna-docusate (Senokot-S) tablet 1 tablet  1 tablet Oral BID Lavina [name], MD   1 tablet at 06/28/18 1002     Discharge Medications: Please see discharge summary for a list of discharge medications.  Relevant Imaging Results:  Relevant Lab Results:   Additional Information SSN# [id number]  Vinie Sill, LCSWA

## 2018-06-28 NOTE — Progress Notes (Signed)
PROGRESS NOTE    [name]  ZOX:[id number] DOB: Nov 20, 1940 DOA: 06/25/2018 PCP: Lemmie Evens, MD   Brief Narrative:   Patient is a 77 y.o. female with history of COPD on home O2, status post CABG, CKD stage IV-presented to the hospital following a mechanical fall, she was found to have a pelvic fracture and was subsequently admitted to the hospitalist service.    Will require skilled nursing facility placement for rehab.  Assessment & Plan:   Active Problems:   Closed fracture of multiple pubic rami (HCC)  Left pelvic fracture: Secondary to a mechanical fall-still with significant pain-continue as needed narcotics.  Evaluated by PT-recommendations of SNF.  Awaiting documentation from social worker.  Dr Sloan Leiter spoke with case management and social work yesterday we are waiting insurance approval per his note  COPD with chronic hypoxemic respiratory failure on home O2: Stable without any evidence of exacerbation.  Continue bronchodilators and oxygen as needed.  CAD: Stable-no anginal symptoms.  PAF: Reviewed most recent outpatient cardiology note-not on anticoagulation as risks of anticoagulation outweigh benefits due to recurrent falls.  Hypertension: Controlled-continue Imdur and Lasix.  Hypokalemia: Replete and recheck.  Chronic kidney disease stage IV: Creatinine close to usual baseline-follow.  Hypothyroidism: Continue Synthroid  Anxiety: Stable-slightly anxious due to pain-and due to plan of being discharged to SNF-continue Klonopin.  Chronic pain syndrome/back pain: Continue as needed narcotics.  Tobacco abuse: Counseled-appears that she has no intention of quitting at this point   DVT prophylaxis: Subcu heparin Code Status: Full code Family Communication: Family present at bedside Disposition Plan:-year-old nursing facility at discharge    Subjective: Continues to have severe pelvic pain.  It is difficult for her to ambulate.  Physical therapy  has recommended skilled nursing facility.  Objective: Vitals:   06/27/18 2048 06/27/18 2125 06/28/18 0625 06/28/18 0901  BP: (!) 117/53  119/64   Pulse: 88  75   Resp: 16  14   Temp: 99.1 F (37.3 C)  98.5 F (36.9 C)   TempSrc: Oral  Oral   SpO2: 100% 93% 93% 94%  Weight:      Height:        Intake/Output Summary (Last 24 hours) at 06/28/2018 1217 Last data filed at 06/28/2018 0900 Gross per 24 hour  Intake 360 ml  Output -  Net 360 ml   Filed Weights   06/26/18 0948  Weight: 78.9 kg    Examination:  General exam: Appears calm and comfortable  Respiratory system: Clear to auscultation. Respiratory effort normal. Cardiovascular system: S1 & S2 heard, RRR. No JVD, murmurs, rubs, gallops or clicks. No pedal edema. Gastrointestinal system: Abdomen is nondistended, soft and nontender. No organomegaly or masses felt. Normal bowel sounds heard. Central nervous system: Alert and oriented. No focal neurological deficits. Extremities: Symmetric 5 x 5 power. Skin: No rashes, lesions or ulcers Psychiatry: Judgement and insight appear normal. Mood & affect appropriate.     Data Reviewed: I have personally reviewed following labs and imaging studies  CBC: Recent Labs  Lab 06/22/18 1310 06/25/18 1533 06/26/18 0220  WBC  --  5.6 5.0  NEUTROABS  --  3.7  --   HGB 11.0* 10.4* 9.0*  HCT  --  33.6* 27.7*  MCV  --  107.0* 106.9*  PLT  --  207 811   Basic Metabolic Panel: Recent Labs  Lab 06/25/18 1533 06/26/18 0220 06/28/18 0342  NA 140 142 140  K 3.3* 3.2* 3.6  CL 98 98 99  CO2 31 30 32  GLUCOSE 101* 110* 100*  BUN 22 22 18   CREATININE 1.82* 1.84* 1.61*  CALCIUM 9.3 8.8* 9.1   GFR: Estimated Creatinine Clearance: 31 mL/min (A) (by C-G formula based on SCr of 1.61 mg/dL (H)). Liver Function Tests: No results for input(s): AST, ALT, ALKPHOS, BILITOT, PROT, ALBUMIN in the last 168 hours. No results for input(s): LIPASE, AMYLASE in the last 168 hours. No results  for input(s): AMMONIA in the last 168 hours. Coagulation Profile: No results for input(s): INR, PROTIME in the last 168 hours. Cardiac Enzymes: No results for input(s): CKTOTAL, CKMB, CKMBINDEX, TROPONINI in the last 168 hours. BNP (last 3 results) No results for input(s): PROBNP in the last 8760 hours. HbA1C: No results for input(s): HGBA1C in the last 72 hours. CBG: No results for input(s): GLUCAP in the last 168 hours. Lipid Profile: No results for input(s): CHOL, HDL, LDLCALC, TRIG, CHOLHDL, LDLDIRECT in the last 72 hours. Thyroid Function Tests: No results for input(s): TSH, T4TOTAL, FREET4, T3FREE, THYROIDAB in the last 72 hours. Anemia Panel: No results for input(s): VITAMINB12, FOLATE, FERRITIN, TIBC, IRON, RETICCTPCT in the last 72 hours. Sepsis Labs: No results for input(s): PROCALCITON, LATICACIDVEN in the last 168 hours.  No results found for this or any previous visit (from the past 240 hour(s)).       Radiology Studies: No results found.      Scheduled Meds: . calcitRIOL  0.5 mcg Oral QODAY  . clonazePAM  2 mg Oral QHS  . furosemide  40 mg Oral Daily  . heparin  5,000 Units Subcutaneous Q8H  . isosorbide mononitrate  60 mg Oral Daily  . levothyroxine  112 mcg Oral Q0600  . loratadine  10 mg Oral Daily  . methocarbamol  500 mg Oral TID  . mometasone-formoterol  2 puff Inhalation BID  . senna-docusate  1 tablet Oral BID   Continuous Infusions:   LOS: 0 days    Time spent: 25 minutes    Lady Deutscher, MD FACP [facility] Pager [phone]  If 7PM-7AM, please contact night-coverage [email or web address] Password [facility] 06/28/2018, 12:17 PM

## 2018-06-28 NOTE — Progress Notes (Signed)
Patient states [facility] and [facility] for SNF preferences. CSW submitted referral to [facility]. CSW will follow up on [place]? CSW provided medicare.gov SNF  Listing . Patient will notify CSW of other choices.   Thurmond Butts, [place] Social Worker [phone]

## 2018-06-29 DIAGNOSIS — J9611 Chronic respiratory failure with hypoxia: Secondary | ICD-10-CM

## 2018-06-29 DIAGNOSIS — J431 Panlobular emphysema: Secondary | ICD-10-CM

## 2018-06-29 DIAGNOSIS — N179 Acute kidney failure, unspecified: Secondary | ICD-10-CM | POA: Diagnosis not present

## 2018-06-29 DIAGNOSIS — W19XXXA Unspecified fall, initial encounter: Secondary | ICD-10-CM

## 2018-06-29 DIAGNOSIS — I10 Essential (primary) hypertension: Secondary | ICD-10-CM

## 2018-06-29 DIAGNOSIS — R0602 Shortness of breath: Secondary | ICD-10-CM

## 2018-06-29 DIAGNOSIS — S32591A Other specified fracture of right pubis, initial encounter for closed fracture: Secondary | ICD-10-CM | POA: Diagnosis not present

## 2018-06-29 MED ORDER — DIPHENHYDRAMINE HCL 25 MG PO CAPS
25.0000 mg | ORAL_CAPSULE | ORAL | Status: DC | PRN
  Administered 2018-06-29 – 2018-07-04 (×12): 25 mg via ORAL
  Filled 2018-06-29 (×14): qty 1

## 2018-06-29 NOTE — Progress Notes (Signed)
Physical Therapy Treatment Patient Details Name: [name] MRN: [id number] DOB: 1940/08/08 Today's Date: 06/29/2018    History of Present Illness 77 y.o. female presenting with mechanical fall [place] at the bank. PMH including CAD S/P CABG, COPD, CKD, chronic pain syndrome, chronic anxiety, anemia of chronic kidney disease, chronic diastolic CHF, and PVD. Imaging showing closed fx of multiple pubic rami and no indication for sx per MD. WBAT.    PT Comments    Patient seen for mobility progression. Pt requires grossly min A for gait training and tolerated distance of 25 ft with RW. Gait deficits and limited distance due to pain. Continue to progress as tolerated with anticipated d/c to SNF for further skilled PT services.     Follow Up Recommendations  SNF     Equipment Recommendations  None recommended by PT    Recommendations for Other Services       Precautions / Restrictions Precautions Precautions: Fall Restrictions Weight Bearing Restrictions: Yes RLE Weight Bearing: Weight bearing as tolerated LLE Weight Bearing: Weight bearing as tolerated    Mobility  Bed Mobility Overal bed mobility: Needs Assistance Bed Mobility: Supine to Sit;Sit to Supine     Supine to sit: Supervision Sit to supine: Min guard   General bed mobility comments: for safety  Transfers Overall transfer level: Needs assistance Equipment used: Rolling walker (2 wheeled) Transfers: Sit to/from Stand Sit to Stand: Min guard         General transfer comment: min guard for safety from EOB and BSC; cues for safe hand placement  Ambulation/Gait Ambulation/Gait assistance: Min assist;Min guard Gait Distance (Feet): 25 Feet Assistive device: Rolling walker (2 wheeled) Gait Pattern/deviations: Step-through pattern;Decreased stride length;Trunk flexed Gait velocity: decreased   General Gait Details: cues for posture and proximity to RW; decreased stride length and cadence   Stairs              Wheelchair Mobility    Modified Rankin (Stroke Patients Only)       Balance Overall balance assessment: Needs assistance Sitting-balance support: No upper extremity supported;Feet supported Sitting balance-Leahy Scale: Fair     Standing balance support: Bilateral upper extremity supported;During functional activity Standing balance-Leahy Scale: Poor Standing balance comment: Reliant on UE support                            Cognition Arousal/Alertness: Awake/alert Behavior During Therapy: WFL for tasks assessed/performed Overall Cognitive Status: Within Functional Limits for tasks assessed                                        Exercises      General Comments        Pertinent Vitals/Pain Pain Assessment: Faces Faces Pain Scale: Hurts little more Pain Location: pelvis with weight bearing Pain Descriptors / Indicators: Aching;Guarding Pain Intervention(s): Limited activity within patient's tolerance;Monitored during session;Premedicated before session;Repositioned    Home Living                      Prior Function            PT Goals (current goals can now be found in the care plan section) Progress towards PT goals: Progressing toward goals    Frequency    Min 3X/week      PT Plan Current plan remains appropriate  Co-evaluation              AM-PAC PT "6 Clicks" Mobility   Outcome Measure  Help needed turning from your back to your side while in a flat bed without using bedrails?: A Little Help needed moving from lying on your back to sitting on the side of a flat bed without using bedrails?: A Lot Help needed moving to and from a bed to a chair (including a wheelchair)?: A Little Help needed standing up from a chair using your arms (e.g., wheelchair or bedside chair)?: A Lot Help needed to walk in hospital room?: A Little Help needed climbing 3-5 steps with a railing? : Total 6 Click Score:  14    End of Session Equipment Utilized During Treatment: Gait belt;Oxygen(2L) Activity Tolerance: Patient limited by pain Patient left: in bed;with call bell/phone within reach Nurse Communication: Mobility status PT Visit Diagnosis: Unsteadiness on feet (R26.81);Muscle weakness (generalized) (M62.81);History of falling (Z91.81);Pain;Difficulty in walking, not elsewhere classified (R26.2) Pain - Right/Left: (both) Pain - part of body: Leg     Time: 1429-1450 PT Time Calculation (min) (ACUTE ONLY): 21 min  Charges:  $Gait Training: 8-22 mins                     Earney Navy, PTA Acute Rehabilitation Services Pager: [phone] Office: [phone]     Darliss Cheney 06/29/2018, 5:12 PM

## 2018-06-29 NOTE — Progress Notes (Signed)
CSW lvm with [facility] to review referral sent yesterday and request notification of acceptance/denial as soon as possible so insurance authorization can be initiated today. CSW was notified admissions was at lunch but will return call.   CSW will continue to follow up.   [place], [place]

## 2018-06-29 NOTE — Progress Notes (Signed)
PROGRESS NOTE    [name]  XFG:[id number] DOB: February 05, 1941 DOA: 06/25/2018 PCP: Lemmie Evens, MD  Outpatient Specialists:     Brief Narrative:   Patient is a73 y.o.female with history of COPD on home O2, status post CABG, CKD stage IV-presented to the hospital 12/22 following a mechanical fall, she was found to have a pelvic fracture and was subsequently admitted to the hospitalist service.  Will require skilled nursing facility placement for rehab. Insurance requiring 3 day stay. Medically ready for discharge 12/26   Assessment & Plan:   Principal Problem:   Closed fracture of multiple pubic rami (HCC) Active Problems:   HTN (hypertension)   CAD (coronary artery disease)   Hypothyroidism   COPD (chronic obstructive pulmonary disease) ([place])   AKI (acute kidney injury) ([place])  Left pelvic fracture:Secondary to a mechanical fall.Evaluated by PT who recommends SNF. Awaiting insurance approval -po pain meds -mobilize  COPD with chronic hypoxemic respiratory failure on home O2:Stable without any evidence of exacerbation. Continue bronchodilators and oxygen as needed.  RCV:ELFYBO-FB anginal symptoms.  PAF: Reviewed most recent outpatient cardiology note-not on anticoagulation as risks of anticoagulation outweigh benefits due to recurrent falls.  Hypertension:Controlled-continue Imdur and Lasix.  Hypokalemia: -Repleted and resolved.  Chronic kidney disease stage IV: -Creatinine close to usual baseline-follow.  Hypothyroidism: Continue Synthroid  Anxiety:Stable -continue Klonopin.  Chronic pain syndrome/back pain: stable at baseline -Continue as needed narcotics.  Tobacco abuse: Counseled-appears that she has no intention of quitting at this point   DVT prophylaxis: heparin Code Status: full Family Communication: none present Disposition Plan: to facility when approved   Consultants:      Procedures:    Subjective: Complains  of some pain with movement. Recognizes benefit short term snf.   Objective: Vitals:   06/29/18 0553 06/29/18 0822 06/29/18 0823 06/29/18 1348  BP: (!) 126/52  (!) 119/52 (!) 116/47  Pulse: (!) 59   60  Resp:    16  Temp: 99.1 F (37.3 C)   98.5 F (36.9 C)  TempSrc: Oral   Oral  SpO2: 90% 92%  97%  Weight:      Height:        Intake/Output Summary (Last 24 hours) at 06/29/2018 1404 Last data filed at 06/29/2018 0827 Gross per 24 hour  Intake 800 ml  Output 200 ml  Net 600 ml   Filed Weights   06/26/18 0948  Weight: 78.9 kg    Examination:  General exam: Appears calm and comfortable sitting up in chair Respiratory system: Clear to auscultation. Respiratory effort normal. Cardiovascular system: S1 & S2 heard, RRR. No JVD, murmurs, rubs, gallops or clicks. No pedal edema. Gastrointestinal system: Abdomen is nondistended, soft and nontender. No organomegaly or masses felt. Normal bowel sounds heard. Central nervous system: Alert and oriented. No focal neurological deficits. Extremities: Symmetric 5 x 5 power. Skin: No rashes, lesions or ulcers Psychiatry: Judgement and insight appear normal. Mood & affect appropriate.     Data Reviewed:   CBC: Recent Labs  Lab 06/25/18 1533 06/26/18 0220  WBC 5.6 5.0  NEUTROABS 3.7  --   HGB 10.4* 9.0*  HCT 33.6* 27.7*  MCV 107.0* 106.9*  PLT 207 510   Basic Metabolic Panel: Recent Labs  Lab 06/25/18 1533 06/26/18 0220 06/28/18 0342  NA 140 142 140  K 3.3* 3.2* 3.6  CL 98 98 99  CO2 31 30 32  GLUCOSE 101* 110* 100*  BUN 22 22 18   CREATININE 1.82* 1.84* 1.61*  CALCIUM 9.3 8.8* 9.1   GFR: Estimated Creatinine Clearance: 31 mL/min (A) (by C-G formula based on SCr of 1.61 mg/dL (H)). Liver Function Tests: No results for input(s): AST, ALT, ALKPHOS, BILITOT, PROT, ALBUMIN in the last 168 hours. No results for input(s): LIPASE, AMYLASE in the last 168 hours. No results for input(s): AMMONIA in the last 168  hours. Coagulation Profile: No results for input(s): INR, PROTIME in the last 168 hours. Cardiac Enzymes: No results for input(s): CKTOTAL, CKMB, CKMBINDEX, TROPONINI in the last 168 hours. BNP (last 3 results) No results for input(s): PROBNP in the last 8760 hours. HbA1C: No results for input(s): HGBA1C in the last 72 hours. CBG: No results for input(s): GLUCAP in the last 168 hours. Lipid Profile: No results for input(s): CHOL, HDL, LDLCALC, TRIG, CHOLHDL, LDLDIRECT in the last 72 hours. Thyroid Function Tests: No results for input(s): TSH, T4TOTAL, FREET4, T3FREE, THYROIDAB in the last 72 hours. Anemia Panel: No results for input(s): VITAMINB12, FOLATE, FERRITIN, TIBC, IRON, RETICCTPCT in the last 72 hours. Urine analysis:    Component Value Date/Time   COLORURINE YELLOW (A) 12/18/2017 2340   APPEARANCEUR CLOUDY (A) 12/18/2017 2340   LABSPEC 1.013 12/18/2017 2340   PHURINE 5.0 12/18/2017 2340   GLUCOSEU NEGATIVE 12/18/2017 2340   HGBUR NEGATIVE 12/18/2017 2340   BILIRUBINUR NEGATIVE 12/18/2017 2340   KETONESUR NEGATIVE 12/18/2017 2340   PROTEINUR NEGATIVE 12/18/2017 2340   NITRITE NEGATIVE 12/18/2017 2340   LEUKOCYTESUR LARGE (A) 12/18/2017 2340   Sepsis Labs: @LABRCNTIP (procalcitonin:4,lacticidven:4)  )No results found for this or any previous visit (from the past 240 hour(s)).       Radiology Studies: No results found.      Scheduled Meds: . calcitRIOL  0.5 mcg Oral QODAY  . clonazePAM  2 mg Oral QHS  . furosemide  40 mg Oral Daily  . heparin  5,000 Units Subcutaneous Q8H  . isosorbide mononitrate  60 mg Oral Daily  . levothyroxine  112 mcg Oral Q0600  . loratadine  10 mg Oral Daily  . methocarbamol  500 mg Oral TID  . mometasone-formoterol  2 puff Inhalation BID  . senna-docusate  1 tablet Oral BID   Continuous Infusions:   LOS: 0 days    Time spent: 60 minutes    Radene Gunning, NP [facility]  If 7PM-7AM, please contact  night-coverage [email or web address] Password [facility] 06/29/2018, 2:04 PM

## 2018-06-29 NOTE — Progress Notes (Signed)
Pt states that vicodin makes her itch. No rash noted on skin.

## 2018-06-29 NOTE — Progress Notes (Signed)
CSW received notification patient is ready for DC. Once bed is acquired patient has [facility] insurance which typically can take up to 3 days to receive insurance auth. NP notified of this.   [place], [place]

## 2018-06-30 DIAGNOSIS — J439 Emphysema, unspecified: Secondary | ICD-10-CM

## 2018-06-30 DIAGNOSIS — S32591A Other specified fracture of right pubis, initial encounter for closed fracture: Secondary | ICD-10-CM | POA: Diagnosis not present

## 2018-06-30 DIAGNOSIS — N179 Acute kidney failure, unspecified: Secondary | ICD-10-CM | POA: Diagnosis not present

## 2018-06-30 DIAGNOSIS — J9611 Chronic respiratory failure with hypoxia: Secondary | ICD-10-CM | POA: Diagnosis not present

## 2018-06-30 NOTE — Progress Notes (Signed)
Physical Therapy Treatment Patient Details Name: [name] MRN: [id number] DOB: [birth year] Today's Date: 06/30/2018    History of Present Illness 77 y.o. female presenting [place]. PMH including CAD S/P CABG, COPD, CKD, chronic pain syndrome, chronic anxiety, anemia of chronic kidney disease, chronic diastolic CHF, and PVD. Imaging showing closed fx of multiple pubic rami and no indication for sx per MD. WBAT.    PT Comments    Patient seen for mobility progression. Pt reports more pain today with mobility and able to tolerate short distance gait in room. Continue to progress as tolerated with anticipated d/c to SNF for further skilled PT services.    Follow Up Recommendations  SNF     Equipment Recommendations  None recommended by PT    Recommendations for Other Services       Precautions / Restrictions Precautions Precautions: Fall Restrictions Weight Bearing Restrictions: Yes RLE Weight Bearing: Weight bearing as tolerated LLE Weight Bearing: Weight bearing as tolerated    Mobility  Bed Mobility Overal bed mobility: Needs Assistance Bed Mobility: Supine to Sit     Supine to sit: Supervision Sit to supine: Min guard   General bed mobility comments: supervision for safety; increased time and effort and use of rail  Transfers Overall transfer level: Needs assistance Equipment used: Rolling walker (2 wheeled) Transfers: Sit to/from Stand Sit to Stand: Min guard         General transfer comment: pt stood X2 from EOB and then from [facility]; cues for safe hand placement; pt came almost into standing first trial from EOB and then abruptly sat back down on bed and this therapist had hand on gait belt to assist if needed and pt became agitated pushing pt away  Ambulation/Gait Ambulation/Gait assistance: Min assist Gait Distance (Feet): (13 ft then 10 ft with seated break) Assistive device: Rolling walker (2 wheeled) Gait  Pattern/deviations: Step-through pattern;Decreased stride length;Trunk flexed Gait velocity: decreased   General Gait Details: distance limited by pain; cues for posture and proximity to RW; assist to steady   Chief Strategy Officer    Modified Rankin (Stroke Patients Only)       Balance Overall balance assessment: Needs assistance Sitting-balance support: No upper extremity supported;Feet supported Sitting balance-Leahy Scale: Fair     Standing balance support: Bilateral upper extremity supported;During functional activity Standing balance-Leahy Scale: Poor Standing balance comment: Reliant on UE support                            Cognition Arousal/Alertness: Awake/alert Behavior During Therapy: Flat affect;Agitated Overall Cognitive Status: Within Functional Limits for tasks assessed                                 General Comments: pt became agitated when therapist was attempting to provide assistance to stand after pt had difficulty doing so on her own and pushed therapist away      Exercises      General Comments        Pertinent Vitals/Pain Pain Assessment: Faces Faces Pain Scale: Hurts even more Pain Location: pelvis with weight bearing Pain Descriptors / Indicators: Aching;Guarding Pain Intervention(s): Limited activity within patient's tolerance;Monitored during session;Premedicated before session;Repositioned    Home Living Family/patient expects to be discharged to:: Private residence Living Arrangements: Non-relatives/Friends;Spouse/significant  other Available Help at Discharge: Family;Available 24 hours/day Type of Home: Mobile home Home Access: Ramped entrance   Home Layout: One level Home Equipment: [place] - 4 wheels;Cane - single point;Bedside commode;Wheelchair - manual Additional Comments: 2L home O2    Prior Function Level of Independence: Independent with assistive device(s)       Comments: Uses rollator for functional mobility. Furnature walks inside home. Performs ADLs and IADLs. does not drive   PT Goals (current goals can now be found in the care plan section) Acute Rehab PT Goals Patient Stated Goal: get better Progress towards PT goals: Not progressing toward goals - comment(limited by pain)    Frequency    Min 3X/week      PT Plan Current plan remains appropriate    Co-evaluation              AM-PAC PT "6 Clicks" Mobility   Outcome Measure  Help needed turning from your back to your side while in a flat bed without using bedrails?: A Little Help needed moving from lying on your back to sitting on the side of a flat bed without using bedrails?: A Lot Help needed moving to and from a bed to a chair (including a wheelchair)?: A Little Help needed standing up from a chair using your arms (e.g., wheelchair or bedside chair)?: A Little Help needed to walk in hospital room?: A Little Help needed climbing 3-5 steps with a railing? : Total 6 Click Score: 15    End of Session Equipment Utilized During Treatment: Gait belt;Oxygen Activity Tolerance: Patient limited by pain Patient left: in chair;with call bell/phone within reach Nurse Communication: Mobility status PT Visit Diagnosis: Unsteadiness on feet (R26.81);Muscle weakness (generalized) (M62.81);History of falling (Z91.81);Pain;Difficulty in walking, not elsewhere classified (R26.2) Pain - Right/Left: (both) Pain - part of body: Leg     Time: 1345-1401 PT Time Calculation (min) (ACUTE ONLY): 16 min  Charges:  $Gait Training: 8-22 mins                     Earney Navy, PTA Acute Rehabilitation Services Pager: [phone] Office: [phone]     Darliss Cheney 06/30/2018, 4:07 PM

## 2018-06-30 NOTE — Progress Notes (Signed)
PROGRESS NOTE    [name]  ION:[id number] DOB: 1941/06/26 DOA: 06/25/2018 PCP: Lemmie Evens, MD  Outpatient Specialists:     Brief Narrative:   Patient is a73 y.o.female with history of COPD on home O2, status post CABG, CKD stage IV-presented to the hospital 12/22 following a mechanical fall, she was found to have a pelvic fracture and was subsequently admitted to the hospitalist service.  Will require skilled nursing facility placement for rehab. Insurance requiring 3 day stay. Medically ready for discharge 12/26   Assessment & Plan:   Principal Problem:   Closed fracture of multiple pubic rami (HCC) Active Problems:   CAD (coronary artery disease)   Hypothyroidism   HTN (hypertension)   COPD (chronic obstructive pulmonary disease) (HCC)   AKI (acute kidney injury) ([place])   Chronic respiratory failure with hypoxia ([place])   Fall  Left pelvic fracture:Secondary to a mechanical fall.Evaluated by PT who recommends SNF. Awaiting insurance approval [employer] is with [place], called by SW today and no response). -po pain meds -mobilize  COPD with chronic hypoxemic respiratory failure on home O2:Stable without any evidence of exacerbation. Continue bronchodilators and oxygen as needed.  KGM:[id number] anginal symptoms.  PAF: Reviewed most recent outpatient cardiology note-not on anticoagulation as risks of anticoagulation outweigh benefits due to recurrent falls.  Hypertension:Controlled-continue Imdur and Lasix.  Hypokalemia: -Repleted and resolved.  Chronic kidney disease stage IV: creat 1.6- 1.8 here.  -Creatinine close to usual baseline-follow.  Hypothyroidism: Continue Synthroid  Anxiety:Stable -continue Klonopin.  Chronic pain syndrome/back pain: stable at baseline -Continue as needed narcotics.  Tobacco abuse: Counseled-appears that she has no intention of quitting at this point   DVT prophylaxis: heparin Code Status:  full Family Communication: none present Disposition Plan: to facility when approved  Kelly Splinter MD [facility] pgr [phone] 06/30/2018, 3:57 PM   Subjective: Complains of some pain with movement. Recognizes benefit short term snf.   Objective: Vitals:   06/29/18 2100 06/29/18 2140 06/30/18 0553 06/30/18 1411  BP: 130/69  (!) 124/42 (!) 116/50  Pulse: 62 (!) 51 70 66  Resp: 18 18 18    Temp: 98.3 F (36.8 C)  98.2 F (36.8 C)   TempSrc: Oral  Oral   SpO2: 93% 95% 96% 98%  Weight:      Height:        Intake/Output Summary (Last 24 hours) at 06/30/2018 1555 Last data filed at 06/29/2018 1700 Gross per 24 hour  Intake 240 ml  Output -  Net 240 ml   Filed Weights   06/26/18 0948  Weight: 78.9 kg    Examination:  General exam: Appears calm and comfortable sitting up in chair Respiratory system: Clear to auscultation. Respiratory effort normal. Cardiovascular system: S1 & S2 heard, RRR. No JVD, murmurs, rubs, gallops or clicks. No pedal edema. Gastrointestinal system: Abdomen is nondistended, soft and nontender. No organomegaly or masses felt. Normal bowel sounds heard. Central nervous system: Alert and oriented. No focal neurological deficits. Extremities: Symmetric 5 x 5 power. Skin: No rashes, lesions or ulcers Psychiatry: Judgement and insight appear normal. Mood & affect appropriate.     Data Reviewed:   CBC: Recent Labs  Lab 06/25/18 1533 06/26/18 0220  WBC 5.6 5.0  NEUTROABS 3.7  --   HGB 10.4* 9.0*  HCT 33.6* 27.7*  MCV 107.0* 106.9*  PLT 207 595   Basic Metabolic Panel: Recent Labs  Lab 06/25/18 1533 06/26/18 0220 06/28/18 0342  NA 140 142 140  K  3.3* 3.2* 3.6  CL 98 98 99  CO2 31 30 32  GLUCOSE 101* 110* 100*  BUN 22 22 18   CREATININE 1.82* 1.84* 1.61*  CALCIUM 9.3 8.8* 9.1   GFR: Estimated Creatinine Clearance: 31 mL/min (A) (by C-G formula based on SCr of 1.61 mg/dL (H)). Liver Function Tests: No results for  input(s): AST, ALT, ALKPHOS, BILITOT, PROT, ALBUMIN in the last 168 hours. No results for input(s): LIPASE, AMYLASE in the last 168 hours. No results for input(s): AMMONIA in the last 168 hours. Coagulation Profile: No results for input(s): INR, PROTIME in the last 168 hours. Cardiac Enzymes: No results for input(s): CKTOTAL, CKMB, CKMBINDEX, TROPONINI in the last 168 hours. BNP (last 3 results) No results for input(s): PROBNP in the last 8760 hours. HbA1C: No results for input(s): HGBA1C in the last 72 hours. CBG: No results for input(s): GLUCAP in the last 168 hours. Lipid Profile: No results for input(s): CHOL, HDL, LDLCALC, TRIG, CHOLHDL, LDLDIRECT in the last 72 hours. Thyroid Function Tests: No results for input(s): TSH, T4TOTAL, FREET4, T3FREE, THYROIDAB in the last 72 hours. Anemia Panel: No results for input(s): VITAMINB12, FOLATE, FERRITIN, TIBC, IRON, RETICCTPCT in the last 72 hours. Urine analysis:    Component Value Date/Time   COLORURINE YELLOW (A) 12/18/2017 2340   APPEARANCEUR CLOUDY (A) 12/18/2017 2340   LABSPEC 1.013 12/18/2017 2340   PHURINE 5.0 12/18/2017 2340   GLUCOSEU NEGATIVE 12/18/2017 2340   HGBUR NEGATIVE 12/18/2017 2340   BILIRUBINUR NEGATIVE 12/18/2017 2340   KETONESUR NEGATIVE 12/18/2017 2340   PROTEINUR NEGATIVE 12/18/2017 2340   NITRITE NEGATIVE 12/18/2017 2340   LEUKOCYTESUR LARGE (A) 12/18/2017 2340   Sepsis Labs: @LABRCNTIP (procalcitonin:4,lacticidven:4)  )No results found for this or any previous visit (from the past 240 hour(s)).       Radiology Studies: No results found.      Scheduled Meds: . calcitRIOL  0.5 mcg Oral QODAY  . clonazePAM  2 mg Oral QHS  . furosemide  40 mg Oral Daily  . heparin  5,000 Units Subcutaneous Q8H  . isosorbide mononitrate  60 mg Oral Daily  . levothyroxine  112 mcg Oral Q0600  . loratadine  10 mg Oral Daily  . methocarbamol  500 mg Oral TID  . mometasone-formoterol  2 puff Inhalation BID  .  senna-docusate  1 tablet Oral BID   Continuous Infusions:   LOS: 0 days

## 2018-06-30 NOTE — Progress Notes (Signed)
Occupational Therapy Treatment Patient Details Name: [name] MRN: [id number] DOB: [birth year] Today's Date: 06/30/2018    History of present illness 77 y.o. female [place]. PMH including CAD S/P CABG, COPD, CKD, chronic pain syndrome, chronic anxiety, anemia of chronic kidney disease, chronic diastolic CHF, and PVD. Imaging showing closed fx of multiple pubic rami and no indication for sx per MD. WBAT.   OT comments  Pt making progress towards OT goals this session. Limited by pain but able to complete toilet transfer in bathroom and peri care - hand washing done in seated position. Pt also educated in [place] for LB ADL as pain management. OT will continue to follow acutely - current POC remains appropriate. Next session to focus on tub transfer with tub bench  Follow Up Recommendations  SNF;Supervision/Assistance - 24 hour    Equipment Recommendations  Tub Bench   Recommendations for Other Services PT consult    Precautions / Restrictions Precautions Precautions: Fall Restrictions Weight Bearing Restrictions: Yes RLE Weight Bearing: Weight bearing as tolerated LLE Weight Bearing: Weight bearing as tolerated       Mobility Bed Mobility Overal bed mobility: Needs Assistance Bed Mobility: Supine to Sit;Sit to Supine     Supine to sit: Supervision Sit to supine: Min guard   General bed mobility comments: for safety  Transfers Overall transfer level: Needs assistance Equipment used: Rolling walker (2 wheeled) Transfers: Sit to/from Stand Sit to Stand: Min guard         General transfer comment: min guard for safety from EOB and BSC; cues for safe hand placement    Balance Overall balance assessment: Needs assistance Sitting-balance support: No upper extremity supported;Feet supported Sitting balance-Leahy Scale: Fair     Standing balance support: Bilateral upper extremity supported;During functional activity Standing  balance-Leahy Scale: Poor Standing balance comment: Reliant on UE support                           ADL either performed or assessed with clinical judgement   ADL Overall ADL's : Needs assistance/impaired     Grooming: Wash/dry hands;Set up;Sitting Grooming Details (indicate cue type and reason): unable to tolerate standing grooming activities                 Toilet Transfer: Minimal assistance;Ambulation;RW Toilet Transfer Details (indicate cue type and reason): BSC over toilet, vc for safety with RW Toileting- Clothing Manipulation and Hygiene: Minimal assistance;Sit to/from stand Toileting - Clothing Manipulation Details (indicate cue type and reason): Min A for standing balance and managing gown             Vision       Perception     Praxis      Cognition Arousal/Alertness: Awake/alert Behavior During Therapy: WFL for tasks assessed/performed Overall Cognitive Status: Within Functional Limits for tasks assessed                                          Exercises     Shoulder Instructions       General Comments      Pertinent Vitals/ Pain       Pain Assessment: Faces Faces Pain Scale: Hurts little more Pain Location: pelvis with weight bearing Pain Descriptors / Indicators: Aching;Guarding Pain Intervention(s): Monitored during session;Repositioned;Premedicated before session  Home Living Family/patient  expects to be discharged to:: Private residence Living Arrangements: Non-relatives/Friends;Spouse/significant other Available Help at Discharge: Family;Available 24 hours/day Type of Home: Mobile home Home Access: Ramped entrance     Home Layout: One level     Bathroom Shower/Tub: Tub/shower unit;Walk-in shower;Tub only   Bathroom Toilet: Standard     Home Equipment: Walker - 4 wheels;Cane - single point;Bedside commode;Wheelchair - manual   Additional Comments: 2L home O2      Prior Functioning/Environment  Level of Independence: Independent with assistive device(s)        Comments: Uses rollator for functional mobility. Furnature walks inside home. Performs ADLs and IADLs. does not drive   Frequency  Min 2X/week        Progress Toward Goals  OT Goals(current goals can now be found in the care plan section)  Progress towards OT goals: Progressing toward goals  Acute Rehab OT Goals Patient Stated Goal: get better OT Goal Formulation: With patient Time For Goal Achievement: 07/10/18 Potential to Achieve Goals: Good  Plan Discharge plan remains appropriate;Frequency remains appropriate    Co-evaluation                 AM-PAC OT "6 Clicks" Daily Activity     Outcome Measure   Help from another person eating meals?: None Help from another person taking care of personal grooming?: A Little Help from another person toileting, which includes using toliet, bedpan, or urinal?: A Little Help from another person bathing (including washing, rinsing, drying)?: A Little Help from another person to put on and taking off regular upper body clothing?: None Help from another person to put on and taking off regular lower body clothing?: A Little 6 Click Score: 20    End of Session Equipment Utilized During Treatment: Gait belt;Rolling walker;Oxygen  OT Visit Diagnosis: Unsteadiness on feet (R26.81);Other abnormalities of gait and mobility (R26.89);Muscle weakness (generalized) (M62.81);Pain Pain - Right/Left: Left Pain - part of body: Leg   Activity Tolerance Patient tolerated treatment well   Patient Left in bed;with call bell/phone within reach;with family/visitor present   Nurse Communication Mobility status;Weight bearing status        Time: 1236-1300 OT Time Calculation (min): 24 min  Charges: OT General Charges $OT Visit: 1 Visit OT Treatments $Self Care/Home Management : 8-22 mins  Hulda Humphrey OTR/L Acute Rehabilitation Services Pager: [phone] Office:  [phone]   [place] 06/30/2018, 2:19 PM

## 2018-07-01 DIAGNOSIS — S32591A Other specified fracture of right pubis, initial encounter for closed fracture: Secondary | ICD-10-CM | POA: Diagnosis not present

## 2018-07-01 LAB — CBC
HEMATOCRIT: 31.2 % — AB (ref 36.0–46.0)
Hemoglobin: 10 g/dL — ABNORMAL LOW (ref 12.0–15.0)
MCH: 34.4 pg — ABNORMAL HIGH (ref 26.0–34.0)
MCHC: 32.1 g/dL (ref 30.0–36.0)
MCV: 107.2 fL — ABNORMAL HIGH (ref 80.0–100.0)
Platelets: 256 10*3/uL (ref 150–400)
RBC: 2.91 MIL/uL — AB (ref 3.87–5.11)
RDW: 15 % (ref 11.5–15.5)
WBC: 5.7 10*3/uL (ref 4.0–10.5)
nRBC: 0 % (ref 0.0–0.2)

## 2018-07-01 NOTE — Progress Notes (Signed)
PROGRESS NOTE    [name]  WJX:[id number] DOB: December 26, 1940 DOA: 06/25/2018 PCP: Lemmie Evens, MD   Brief Narrative:   Patient is a56 y.o.female with history of COPD on home O2, status post CABG, CKD stage IV-presented to the hospital 12/22 following a mechanical fall, she was found to have a pelvic fracture and was subsequently admitted to the hospitalist service.Will require skilled nursing facility placement for rehab. Insurance requiring 3 day stay. Medically ready for discharge 12/26  Assessment & Plan:   Principal Problem:   Closed fracture of multiple pubic rami (HCC) Active Problems:   CAD (coronary artery disease)   Hypothyroidism   HTN (hypertension)   COPD (chronic obstructive pulmonary disease) (HCC)   AKI (acute kidney injury) ([place])   Chronic respiratory failure with hypoxia ([place])   Fall   Left pelvic fracture:Secondary to a mechanical fall.Evaluated by PT who recommends SNF. Awaiting insurance approval [employer] is with [facility], called by SW today and no response). -po pain meds -mobilize  COPD with chronic hypoxemic respiratory failure on home O2:Stable without any evidence of exacerbation. Continue bronchodilators and oxygen as needed.  OZH:[id number] anginal symptoms.  PAF: Reviewed most recent outpatient cardiology note-not on anticoagulation as risks of anticoagulation outweigh benefits due to recurrent falls.  Hypertension:Controlled-continue Imdur and Lasix.  Patient requested regular diet as she follows a regular diet at home and has no blood pressure problems.  I will switch her to regular.  Hypokalemia: -Repleted and resolved.  Chronic kidney disease stage IV: creat 1.6- 1.8 here.  -Creatinine close to usual baseline-follow.  Hypothyroidism: Continue Synthroid  Anxiety:Stable -continue Klonopin.  Chronic pain syndrome/back pain: stable at baseline -Continue as needed narcotics.  Tobacco abuse: Counseled-appears  that she has no intention of quitting at this point   DVT prophylaxis: heparin Code Status: full Family Communication: none present Disposition Plan: to facility when approved  Subjective: Fairly miserable about being here so long  Objective: Vitals:   06/30/18 1411 06/30/18 1944 07/01/18 0506 07/01/18 1317  BP: (!) 116/50 (!) 135/54 (!) 117/49 (!) 118/42  Pulse: 66 64 67 70  Resp:  14 14 16   Temp:  98.5 F (36.9 C) 98.4 F (36.9 C) 98.3 F (36.8 C)  TempSrc:  Oral Oral Oral  SpO2: 98% 93% (!) 87% 98%  Weight:      Height:        Intake/Output Summary (Last 24 hours) at 07/01/2018 1427 Last data filed at 07/01/2018 1300 Gross per 24 hour  Intake 480 ml  Output -  Net 480 ml   Filed Weights   06/26/18 0948  Weight: 78.9 kg    Examination:  General exam: Appears calm and comfortable  Respiratory system: Clear to auscultation. Respiratory effort normal. Cardiovascular system: S1 & S2 heard, RRR. No JVD, murmurs, rubs, gallops or clicks. No pedal edema. Gastrointestinal system: Abdomen is nondistended, soft and nontender. No organomegaly or masses felt. Normal bowel sounds heard. Central nervous system: Alert and oriented. No focal neurological deficits. Extremities: pain with ROM.  Can't weight bear very well. Skin: No rashes, lesions or ulcers Psychiatry: Judgement and insight appear normal. Mood & affect appropriate.     Data Reviewed: I have personally reviewed following labs and imaging studies  CBC: Recent Labs  Lab 06/25/18 1533 06/26/18 0220 07/01/18 0330  WBC 5.6 5.0 5.7  NEUTROABS 3.7  --   --   HGB 10.4* 9.0* 10.0*  HCT 33.6* 27.7* 31.2*  MCV 107.0* 106.9* 107.2*  PLT 207  192 220   Basic Metabolic Panel: Recent Labs  Lab 06/25/18 1533 06/26/18 0220 06/28/18 0342  NA 140 142 140  K 3.3* 3.2* 3.6  CL 98 98 99  CO2 31 30 32  GLUCOSE 101* 110* 100*  BUN 22 22 18   CREATININE 1.82* 1.84* 1.61*  CALCIUM 9.3 8.8* 9.1   GFR: Estimated  Creatinine Clearance: 31 mL/min (A) (by C-G formula based on SCr of 1.61 mg/dL (H)). Liver Function Tests: No results for input(s): AST, ALT, ALKPHOS, BILITOT, PROT, ALBUMIN in the last 168 hours. No results for input(s): LIPASE, AMYLASE in the last 168 hours. No results for input(s): AMMONIA in the last 168 hours. Coagulation Profile: No results for input(s): INR, PROTIME in the last 168 hours. Cardiac Enzymes: No results for input(s): CKTOTAL, CKMB, CKMBINDEX, TROPONINI in the last 168 hours. BNP (last 3 results) No results for input(s): PROBNP in the last 8760 hours. HbA1C: No results for input(s): HGBA1C in the last 72 hours. CBG: No results for input(s): GLUCAP in the last 168 hours. Lipid Profile: No results for input(s): CHOL, HDL, LDLCALC, TRIG, CHOLHDL, LDLDIRECT in the last 72 hours. Thyroid Function Tests: No results for input(s): TSH, T4TOTAL, FREET4, T3FREE, THYROIDAB in the last 72 hours. Anemia Panel: No results for input(s): VITAMINB12, FOLATE, FERRITIN, TIBC, IRON, RETICCTPCT in the last 72 hours. Sepsis Labs: No results for input(s): PROCALCITON, LATICACIDVEN in the last 168 hours.  No results found for this or any previous visit (from the past 240 hour(s)).       Radiology Studies: No results found.      Scheduled Meds: . calcitRIOL  0.5 mcg Oral QODAY  . clonazePAM  2 mg Oral QHS  . furosemide  40 mg Oral Daily  . heparin  5,000 Units Subcutaneous Q8H  . isosorbide mononitrate  60 mg Oral Daily  . levothyroxine  112 mcg Oral Q0600  . loratadine  10 mg Oral Daily  . methocarbamol  500 mg Oral TID  . mometasone-formoterol  2 puff Inhalation BID  . senna-docusate  1 tablet Oral BID   Continuous Infusions:   LOS: 0 days    Time spent: 20 mins    Lady Deutscher, MD FACP [facility] Pager [phone]  If 7PM-7AM, please contact night-coverage [email or web address] Password [facility] 07/01/2018, 2:27 PM

## 2018-07-02 DIAGNOSIS — S32592A Other specified fracture of left pubis, initial encounter for closed fracture: Secondary | ICD-10-CM | POA: Diagnosis not present

## 2018-07-02 NOTE — Progress Notes (Signed)
PROGRESS NOTE    [name]  XKG:[id number] DOB: 1940-12-15 DOA: 06/25/2018 PCP: Lemmie Evens, MD   Brief Narrative:  Patient is a1 y.o.female with history of COPD on home O2, status post CABG, CKD stage IV-presented to the hospital 12/22 following a mechanical fall, she was found to have a pelvic fracture and was subsequently admitted to the hospitalist service.Will require skilled nursing facility placement for rehab. Insurance which may take 3 days to give approval. Medically ready for discharge 12/26   Assessment & Plan:   Principal Problem:   Closed fracture of multiple pubic rami ([place]) Active Problems:   CAD (coronary artery disease)   Hypothyroidism   HTN (hypertension)   COPD (chronic obstructive pulmonary disease) (HCC)   AKI (acute kidney injury) ([place])   Chronic respiratory failure with hypoxia ([place])   Fall   Left pelvic fracture:Secondary to a mechanical fall.Evaluated by PT who recommends SNF. Awaiting insurance approval  -po pain meds -mobilize  COPD with chronic hypoxemic respiratory failure on home O2:Stable without any evidence of exacerbation. Continue bronchodilators and oxygen as needed.  FWY:[id number] anginal symptoms.  PAF: Reviewed most recent outpatient cardiology note-not on anticoagulation as risks of anticoagulation outweigh benefits due to recurrent falls.  Hypertension:Controlled-continue Imdur and Lasix.  Patient requested regular diet as she follows a regular diet at home and has no blood pressure problems.  I Hypokalemia: -Repleted and resolved.  Chronic kidney disease stage OY:DXAJO 1.6- 1.8 here. -Creatinine close to usual baseline-follow.  Hypothyroidism: Continue Synthroid  Anxiety:Stable -continue Klonopin.  Chronic pain syndrome/back pain: stable at baseline -Continue as needed narcotics.  Tobacco abuse: Counseled-appears that she has no intention of quitting at this point   DVT  prophylaxis:heparin Code Status: full Family Communication: elderly confused husband present Disposition Plan:to facility when approved    Subjective: Continues to await insurance approval for transfer to skilled facility.  Last physical therapy eval still recommending SNF  Objective: Vitals:   07/01/18 1951 07/01/18 2119 07/02/18 0632 07/02/18 1519  BP: (!) 149/119  (!) 116/53 (!) 121/54  Pulse: 84  (!) 59 73  Resp: 16  16 15   Temp: 98.5 F (36.9 C)  98.4 F (36.9 C) 98.5 F (36.9 C)  TempSrc: Oral  Oral Oral  SpO2: (!) 80% 95% 96% 98%  Weight:      Height:        Intake/Output Summary (Last 24 hours) at 07/02/2018 1543 Last data filed at 07/02/2018 0754 Gross per 24 hour  Intake 358 ml  Output -  Net 358 ml   Filed Weights   06/26/18 0948  Weight: 78.9 kg    Examination:  General exam: Appears calm and comfortable  Respiratory system: Clear to auscultation. Respiratory effort normal. Cardiovascular system: S1 & S2 heard, RRR. No JVD, murmurs, rubs, gallops or clicks. No pedal edema. Gastrointestinal system: Abdomen is nondistended, soft and nontender. No organomegaly or masses felt. Normal bowel sounds heard. Central nervous system: Alert and oriented. No focal neurological deficits. Extremities: Symmetric 5 x 5 power. Skin: No rashes, lesions or ulcers Psychiatry: Judgement and insight appear normal. Mood & affect appropriate.     Data Reviewed: I have personally reviewed following labs and imaging studies  CBC: Recent Labs  Lab 06/26/18 0220 07/01/18 0330  WBC 5.0 5.7  HGB 9.0* 10.0*  HCT 27.7* 31.2*  MCV 106.9* 107.2*  PLT 192 878   Basic Metabolic Panel: Recent Labs  Lab 06/26/18 0220 06/28/18 0342  NA 142 140  K 3.2*  3.6  CL 98 99  CO2 30 32  GLUCOSE 110* 100*  BUN 22 18  CREATININE 1.84* 1.61*  CALCIUM 8.8* 9.1   GFR: Estimated Creatinine Clearance: 31 mL/min (A) (by C-G formula based on SCr of 1.61 mg/dL (H)). Liver Function  Tests: No results for input(s): AST, ALT, ALKPHOS, BILITOT, PROT, ALBUMIN in the last 168 hours. No results for input(s): LIPASE, AMYLASE in the last 168 hours. No results for input(s): AMMONIA in the last 168 hours. Coagulation Profile: No results for input(s): INR, PROTIME in the last 168 hours. Cardiac Enzymes: No results for input(s): CKTOTAL, CKMB, CKMBINDEX, TROPONINI in the last 168 hours. BNP (last 3 results) No results for input(s): PROBNP in the last 8760 hours. HbA1C: No results for input(s): HGBA1C in the last 72 hours. CBG: No results for input(s): GLUCAP in the last 168 hours. Lipid Profile: No results for input(s): CHOL, HDL, LDLCALC, TRIG, CHOLHDL, LDLDIRECT in the last 72 hours. Thyroid Function Tests: No results for input(s): TSH, T4TOTAL, FREET4, T3FREE, THYROIDAB in the last 72 hours. Anemia Panel: No results for input(s): VITAMINB12, FOLATE, FERRITIN, TIBC, IRON, RETICCTPCT in the last 72 hours. Sepsis Labs: No results for input(s): PROCALCITON, LATICACIDVEN in the last 168 hours.  No results found for this or any previous visit (from the past 240 hour(s)).       Radiology Studies: No results found.      Scheduled Meds: . calcitRIOL  0.5 mcg Oral QODAY  . clonazePAM  2 mg Oral QHS  . furosemide  40 mg Oral Daily  . heparin  5,000 Units Subcutaneous Q8H  . isosorbide mononitrate  60 mg Oral Daily  . levothyroxine  112 mcg Oral Q0600  . loratadine  10 mg Oral Daily  . methocarbamol  500 mg Oral TID  . mometasone-formoterol  2 puff Inhalation BID  . senna-docusate  1 tablet Oral BID   Continuous Infusions:   LOS: 0 days    Time spent: 25 min    Lady Deutscher, MD FACP [facility] Pager [phone]  If 7PM-7AM, please contact night-coverage [email or web address] Password [facility] 07/02/2018, 3:43 PM

## 2018-07-03 DIAGNOSIS — S32592A Other specified fracture of left pubis, initial encounter for closed fracture: Secondary | ICD-10-CM | POA: Diagnosis not present

## 2018-07-03 NOTE — Progress Notes (Signed)
CSW received notification from [facility] that insurance requesting updated PT/OT notes. CSW paged both PT and OT on care team, awaiting call back.  CSW also notified nurse.   Will continue to follow up.   [place], Aurelia

## 2018-07-03 NOTE — Progress Notes (Signed)
PROGRESS NOTE    [name]  SWN:[id number] DOB: 1940-08-28 DOA: 06/25/2018 PCP: Lemmie Evens, MD   Brief Narrative:  Patient is a87 y.o.female with history of COPD on home O2, status post CABG, CKD stage IV-presented to the hospital 12/22 following a mechanical fall, she was found to have a pelvic fracture and was subsequently admitted to the hospitalist service.Will require skilled nursing facility placement for rehab. Insurance which may take 3 days to give approval. Medically ready for discharge 12/26   Assessment & Plan:   Principal Problem:   Closed fracture of multiple pubic rami ([place]) Active Problems:   CAD (coronary artery disease)   Hypothyroidism   HTN (hypertension)   COPD (chronic obstructive pulmonary disease) (HCC)   AKI (acute kidney injury) ([place])   Chronic respiratory failure with hypoxia ([place])   Fall  Left pelvic fracture:Secondary to a mechanical fall.Evaluated by PT who recommends SNF. Awaiting insurance approval  -po pain meds -mobilize Patient got up and went to the bathroom today which is only a few steps from her bed.  She had significant pain and had to rest a great deal after that.  She appeared very tired and winded and weak.  I encouraged her to take her pain medicine prior to ambulation.  She is awaiting a physical therapy evaluation she has not been seen since the 26th.  COPD with chronic hypoxemic respiratory failure on home O2:Stable without any evidence of exacerbation. Continue bronchodilators and oxygen as needed.  XFG:[id number] anginal symptoms.  PAF: Reviewed most recent outpatient cardiology note-not on anticoagulation as risks of anticoagulation outweigh benefits due to recurrent falls.  Hypertension:Controlled-continue Imdur and Lasix. Patient requested regular diet as she follows a regular diet at home and has no blood pressure problems. I Hypokalemia: -Repleted and resolved.  Chronic kidney disease stage  IR:CVELF 1.6- 1.8 here. -Creatinine close to usual baseline-follow.  Hypothyroidism: Continue Synthroid  Anxiety:Stable -continue Klonopin.  Chronic pain syndrome/back pain: stable at baseline -Continue as needed narcotics.  Tobacco abuse: Counseled-appears that she has no intention of quitting at this point   DVT prophylaxis:heparin Code Status: full Family Communication: elderly confused husband present Disposition Plan:to facility when approved   Subjective: Exhausted after a short ambulation to the bathroom.  Pain significantly worse.  Objective: Vitals:   07/02/18 1519 07/02/18 2043 07/03/18 0503 07/03/18 1302  BP: (!) 121/54 126/66 128/64 (!) 118/57  Pulse: 73 80 82 72  Resp: 15 18 20    Temp: 98.5 F (36.9 C) 100.2 F (37.9 C) 98.4 F (36.9 C) 99.1 F (37.3 C)  TempSrc: Oral Oral Oral Oral  SpO2: 98% 95% 98% 95%  Weight:      Height:        Intake/Output Summary (Last 24 hours) at 07/03/2018 1320 Last data filed at 07/03/2018 0835 Gross per 24 hour  Intake 360 ml  Output 1 ml  Net 359 ml   Filed Weights   06/26/18 0948  Weight: 78.9 kg    Examination:  General exam: Appears calm and comfortable  Respiratory system: Clear to auscultation. Respiratory effort normal. Cardiovascular system: S1 & S2 heard, RRR. No JVD, murmurs, rubs, gallops or clicks. No pedal edema. Gastrointestinal system: Abdomen is nondistended, soft and nontender. No organomegaly or masses felt. Normal bowel sounds heard. Central nervous system: Alert and oriented. No focal neurological deficits. Extremities: Symmetric 5 x 5 power. Skin: No rashes, lesions or ulcers Psychiatry: Judgement and insight appear normal. Mood & affect appropriate.     Data  Reviewed: I have personally reviewed following labs and imaging studies  CBC: Recent Labs  Lab 07/01/18 0330  WBC 5.7  HGB 10.0*  HCT 31.2*  MCV 107.2*  PLT 865   Basic Metabolic Panel: Recent Labs  Lab  06/28/18 0342  NA 140  K 3.6  CL 99  CO2 32  GLUCOSE 100*  BUN 18  CREATININE 1.61*  CALCIUM 9.1   GFR: Estimated Creatinine Clearance: 31 mL/min (A) (by C-G formula based on SCr of 1.61 mg/dL (H)). Liver Function Tests: No results for input(s): AST, ALT, ALKPHOS, BILITOT, PROT, ALBUMIN in the last 168 hours. No results for input(s): LIPASE, AMYLASE in the last 168 hours. No results for input(s): AMMONIA in the last 168 hours. Coagulation Profile: No results for input(s): INR, PROTIME in the last 168 hours. Cardiac Enzymes: No results for input(s): CKTOTAL, CKMB, CKMBINDEX, TROPONINI in the last 168 hours. BNP (last 3 results) No results for input(s): PROBNP in the last 8760 hours. HbA1C: No results for input(s): HGBA1C in the last 72 hours. CBG: No results for input(s): GLUCAP in the last 168 hours. Lipid Profile: No results for input(s): CHOL, HDL, LDLCALC, TRIG, CHOLHDL, LDLDIRECT in the last 72 hours. Thyroid Function Tests: No results for input(s): TSH, T4TOTAL, FREET4, T3FREE, THYROIDAB in the last 72 hours. Anemia Panel: No results for input(s): VITAMINB12, FOLATE, FERRITIN, TIBC, IRON, RETICCTPCT in the last 72 hours. Sepsis Labs: No results for input(s): PROCALCITON, LATICACIDVEN in the last 168 hours.  No results found for this or any previous visit (from the past 240 hour(s)).       Radiology Studies: No results found.      Scheduled Meds: . calcitRIOL  0.5 mcg Oral QODAY  . clonazePAM  2 mg Oral QHS  . furosemide  40 mg Oral Daily  . heparin  5,000 Units Subcutaneous Q8H  . isosorbide mononitrate  60 mg Oral Daily  . levothyroxine  112 mcg Oral Q0600  . loratadine  10 mg Oral Daily  . methocarbamol  500 mg Oral TID  . mometasone-formoterol  2 puff Inhalation BID  . senna-docusate  1 tablet Oral BID   Continuous Infusions:   LOS: 0 days    Time spent: 25 minutes    Lady Deutscher, MD FACP [facility] Pager [phone]  If  7PM-7AM, please contact night-coverage [email or web address] Password [email or web address] 07/03/2018, 1:20 PM

## 2018-07-03 NOTE — Progress Notes (Signed)
Physical Therapy Treatment Patient Details Name: [name] MRN: [id number] DOB: [birth year] Today's Date: 07/03/2018    History of Present Illness 77 y.o. female [place]. PMH including CAD S/P CABG, COPD, CKD, chronic pain syndrome, chronic anxiety, anemia of chronic kidney disease, chronic diastolic CHF, and PVD. Imaging showing closed fx of multiple pubic rami and no indication for sx per MD. WBAT.    PT Comments    Patient seen for mobility progression. Pt is making progress today toward PT goals and tolerated gait training well with c/o 5/10 pain. Pt is now declining d/c to SNF and would like to go home instead. Pt will need 24 hour supervision/assistance for OOB mobility given her high fall risk and history of falls. Continue to progress as tolerated.     Follow Up Recommendations  SNF     Equipment Recommendations  None recommended by PT    Recommendations for Other Services       Precautions / Restrictions Precautions Precautions: Fall Restrictions Weight Bearing Restrictions: Yes RLE Weight Bearing: Weight bearing as tolerated LLE Weight Bearing: Weight bearing as tolerated    Mobility  Bed Mobility               General bed mobility comments: pt sitting EOB upon arrival  Transfers Overall transfer level: Needs assistance Equipment used: Rolling walker (2 wheeled) Transfers: Sit to/from Stand Sit to Stand: Min guard         General transfer comment: min guard for safety; cues for safe hand placement  Ambulation/Gait Ambulation/Gait assistance: Min guard Gait Distance (Feet): 120 Feet Assistive device: Rolling walker (2 wheeled) Gait Pattern/deviations: Step-through pattern;Decreased stride length;Trunk flexed Gait velocity: decreased   General Gait Details: cues for posture; decreased cadence; SpO2 91% on 2L O2 via [place]    Stairs             Wheelchair Mobility    Modified Rankin (Stroke  Patients Only)       Balance Overall balance assessment: Needs assistance Sitting-balance support: No upper extremity supported;Feet supported Sitting balance-Leahy Scale: Good     Standing balance support: Bilateral upper extremity supported;During functional activity Standing balance-Leahy Scale: Poor                              Cognition Arousal/Alertness: Awake/alert Behavior During Therapy: Flat affect Overall Cognitive Status: Within Functional Limits for tasks assessed                                        Exercises      General Comments        Pertinent Vitals/Pain Pain Assessment: 0-10 Pain Score: 5  Pain Location: pelvis with weight bearing Pain Descriptors / Indicators: Aching;Sore Pain Intervention(s): Limited activity within patient's tolerance;Monitored during session;Premedicated before session;Repositioned    Home Living                      Prior Function            PT Goals (current goals can now be found in the care plan section) Progress towards PT goals: Progressing toward goals    Frequency    Min 3X/week      PT Plan Current plan remains appropriate    Co-evaluation  AM-PAC PT "6 Clicks" Mobility   Outcome Measure  Help needed turning from your back to your side while in a flat bed without using bedrails?: A Little Help needed moving from lying on your back to sitting on the side of a flat bed without using bedrails?: A Lot Help needed moving to and from a bed to a chair (including a wheelchair)?: A Little Help needed standing up from a chair using your arms (e.g., wheelchair or bedside chair)?: A Little Help needed to walk in hospital room?: A Little Help needed climbing 3-5 steps with a railing? : Total 6 Click Score: 15    End of Session Equipment Utilized During Treatment: Gait belt;Oxygen Activity Tolerance: Patient tolerated treatment well Patient left: in  chair;with call bell/phone within reach;with family/visitor present Nurse Communication: Mobility status PT Visit Diagnosis: Unsteadiness on feet (R26.81);Muscle weakness (generalized) (M62.81);History of falling (Z91.81);Pain;Difficulty in walking, not elsewhere classified (R26.2) Pain - Right/Left: (both) Pain - part of body: Leg     Time: 1208-1230 PT Time Calculation (min) (ACUTE ONLY): 22 min  Charges:  $Gait Training: 8-22 mins                     Earney Navy, PTA Acute Rehabilitation Services Pager: [phone] Office: [phone]     Darliss Cheney 07/03/2018, 1:25 PM

## 2018-07-04 DIAGNOSIS — S32592A Other specified fracture of left pubis, initial encounter for closed fracture: Secondary | ICD-10-CM | POA: Diagnosis not present

## 2018-07-04 MED ORDER — DIPHENHYDRAMINE HCL 25 MG PO CAPS: 25.0000 mg | ORAL_CAPSULE | ORAL | 0 refills | Status: AC | PRN

## 2018-07-04 MED ORDER — HYDROCODONE-ACETAMINOPHEN 5-325 MG PO TABS: 1.0000 | ORAL_TABLET | ORAL | 0 refills | Status: AC | PRN

## 2018-07-04 MED ORDER — BISACODYL 10 MG RE SUPP: 10.0000 mg | Freq: Every day | RECTAL | 0 refills | Status: AC | PRN

## 2018-07-04 MED ORDER — SENNOSIDES-DOCUSATE SODIUM 8.6-50 MG PO TABS: 1.0000 | ORAL_TABLET | Freq: Two times a day (BID) | ORAL | 0 refills | Status: AC

## 2018-07-04 MED ORDER — METHOCARBAMOL 500 MG PO TABS: 500.0000 mg | ORAL_TABLET | Freq: Three times a day (TID) | ORAL | 0 refills | Status: AC

## 2018-07-04 NOTE — Discharge Summary (Signed)
Physician Discharge Summary  [name] UEA:[id number] DOB: 01/11/1941 DOA: 06/25/2018  PCP: Lemmie Evens, MD  Admit date: 06/25/2018  Discharge date: 07/04/2018  Admitted From:Home  Disposition:  Home  Recommendations for Outpatient Follow-up:  1. Follow up with PCP in 1-2 weeks 2. Continue with home health PT  Home Health: Yes with PT  Equipment/Devices: Patient has wheelchair, walker, and bedside commode  Discharge Condition: Stable  CODE STATUS: Full  Diet recommendation: Heart Healthy  Brief/Interim Summary: Per HPI: Patient is a58 y.o.female with history of COPD on home O2, status post CABG, CKD stage IV-presented to the hospital 12/22 following a mechanical fall, she was found to have a pelvic fracture and was subsequently admitted to the hospitalist service.  She was seen by physical therapy and was recommended to go to skilled nursing facility for rehabilitation and was awaiting insurance approval.  She unfortunately wants to go home with home health instead and understands that she may be a high risk for fall, but does have multiple equipment at home as well as family members and friends who will assist her at home.  She is otherwise medically stable and has been stable for discharge since 12/26.  No other acute events noted during this admission.  Discharge Diagnoses:  Principal Problem:   Closed fracture of multiple pubic rami (HCC) Active Problems:   CAD (coronary artery disease)   Hypothyroidism   HTN (hypertension)   COPD (chronic obstructive pulmonary disease) (HCC)   AKI (acute kidney injury) ([place])   Chronic respiratory failure with hypoxia ([place])   Fall  Stable discharge diagnoses: Closed fracture of pubic ramus.  Discharge Instructions  Discharge Instructions    Diet - low sodium heart healthy   Complete by:  As directed    Increase activity slowly   Complete by:  As directed      Allergies as of 07/04/2018      Reactions    Ciprofloxacin Anaphylaxis   Penicillins Anaphylaxis   Near death experience,  DID THE REACTION INVOLVE: Swelling of the face/tongue/throat, SOB, or low BP? Yes Sudden or severe rash/hives, skin peeling, or the inside of the mouth or nose? No Did it require medical treatment? Yes When did it last happen?age - 20's or 30's If all above answers are "NO", may proceed with cephalosporin use.   Amlodipine Swelling   Leg swelling   Morphine And Related Hives, Itching      Cefaclor Nausea And Vomiting, Other (See Comments)   Codeine Nausea Only, Other (See Comments)   Cramps   Lipitor [atorvastatin] Other (See Comments)   Myalgias   Prednisone Palpitations   Sulfa Antibiotics Rash          Medication List    TAKE these medications   acetaminophen 500 MG tablet Commonly known as:  TYLENOL Take 500-1,000 mg by mouth 2 (two) times daily as needed for headache (pain). Lunch and bedtime   albuterol (2.5 MG/3ML) 0.083% nebulizer solution Commonly known as:  PROVENTIL Take 2.5 mg by nebulization every 6 (six) hours as needed for shortness of breath.   albuterol 108 (90 Base) MCG/ACT inhaler Commonly known as:  PROVENTIL HFA;VENTOLIN HFA Inhale 2 puffs into the lungs every 4 (four) hours as needed for wheezing or shortness of breath.   bisacodyl 10 MG suppository Commonly known as:  DULCOLAX Place 1 suppository (10 mg total) rectally daily as needed for moderate constipation.   calcitRIOL 0.5 MCG capsule Commonly known as:  ROCALTROL Take 0.5 mcg by  mouth every other day.   cetirizine 10 MG tablet Commonly known as:  ZYRTEC Take 10 mg by mouth daily.   clonazePAM 1 MG tablet Commonly known as:  KLONOPIN Take 2-3 mg by mouth at bedtime.   diphenhydrAMINE 25 mg capsule Commonly known as:  BENADRYL Take 1 capsule (25 mg total) by mouth every 4 (four) hours as needed for itching.   Fluticasone-Salmeterol 500-50 MCG/DOSE Aepb Commonly known as:  ADVAIR Inhale 1 puff into  the lungs 2 (two) times daily.   furosemide 40 MG tablet Commonly known as:  LASIX Take 40 mg by mouth daily.   HYDROcodone-acetaminophen 5-325 MG tablet Commonly known as:  NORCO/VICODIN Take 1 tablet by mouth every 4 (four) hours as needed for up to 7 days for moderate pain. What changed:  when to take this   isosorbide mononitrate 60 MG 24 hr tablet Commonly known as:  IMDUR TAKE ONE TABLET (60MG  TOTAL) BY MOUTH TWO TIMES DAILY What changed:  See the new instructions.   levothyroxine 112 MCG tablet Commonly known as:  SYNTHROID, LEVOTHROID Take 112 mcg by mouth daily before breakfast.   methocarbamol 500 MG tablet Commonly known as:  ROBAXIN Take 1 tablet (500 mg total) by mouth 3 (three) times daily.   nitroGLYCERIN 0.4 MG/SPRAY spray Commonly known as:  NITROLINGUAL Place 1 spray under the tongue every 5 (five) minutes x 3 doses as needed for chest pain.   omega-3 acid ethyl esters 1 g capsule Commonly known as:  LOVAZA Take 2 g by mouth 2 (two) times daily.   senna-docusate 8.6-50 MG tablet Commonly known as:  Senokot-S Take 1 tablet by mouth 2 (two) times daily.   vitamin B-12 1000 MCG tablet Commonly known as:  CYANOCOBALAMIN Take 1,000 mcg by mouth daily.   vitamin C 1000 MG tablet Take 1,000 mg by mouth daily.       Contact information for follow-up providers    [facility].   Specialty:  [place] Why:  A representative from [facility] will contact you to arrange start date and time for your therapy. Contact information: [place] [address] [phone]        Lemmie Evens, MD Follow up in 1 week(s).   Specialty:  Family Medicine Contact information: [place] [address] [phone]        Minna Merritts, MD .   Specialty:  Cardiology Contact information: [place] [address] [phone]             Contact information for after-discharge care    [facility] .   Service:  Skilled Nursing Contact information: [place] [phone]                 Allergies  Allergen Reactions  . Ciprofloxacin Anaphylaxis  . Penicillins Anaphylaxis    Near death experience,  DID THE REACTION INVOLVE: Swelling of the face/tongue/throat, SOB, or low BP? Yes Sudden or severe rash/hives, skin peeling, or the inside of the mouth or nose? No Did it require medical treatment? Yes When did it last happen?age - 20's or 30's If all above answers are "NO", may proceed with cephalosporin use.  . Amlodipine Swelling    Leg swelling  . Morphine And Related Hives and Itching       . Cefaclor Nausea And Vomiting and Other (  See Comments)  . Codeine Nausea Only and Other (See Comments)    Cramps  . Lipitor [Atorvastatin] Other (See Comments)    Myalgias   . Prednisone Palpitations  . Sulfa Antibiotics Rash         Consultations:  None   Procedures/Studies: Dg Lumbar Spine Complete  Result Date: 06/25/2018 CLINICAL DATA:  Fall, back pain EXAM: LUMBAR SPINE - COMPLETE 4+ VIEW COMPARISON:  Lumbar MRI 05/19/2018 FINDINGS: Normal alignment of vertebral bodies. Loss of vertebral height in the L1 vertebral body similar comparison MRI. There is osteophytosis at this level. No acute loss vertebral body height or disc height. No pars fracture. No subluxation. Atherosclerotic calcification of the aorta. IMPRESSION: 1. No acute findings of the spine. 2. 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  Atherosclerotic calcification of the aorta. Electronically Signed   By: Suzy Bouchard M.D.   On: 06/25/2018 14:52   Dg Sacrum/coccyx  Result Date: 06/25/2018 CLINICAL DATA:  Fall, sacral pain.  Recent sacral plasty EXAM: SACRUM AND COCCYX - 2+ VIEW COMPARISON:  Pelvis film 05/12/2018  FINDINGS: Sacroplasty cement noted in LEFT and RIGHT sacral ala. No evidence of sacral fracture. No displaced coccygeal fracture. Atherosclerotic calcification of the aorta. IMPRESSION: No evidence of acute sacral or coccygeal fracture. Electronically Signed   By: Suzy Bouchard M.D.   On: 06/25/2018 14:48   Dg Chest Port 1 View  Result Date: 06/25/2018 CLINICAL DATA:  [place] on Friday, LEFT hip pain, shortness of breath, difficulty walking, history coronary artery disease, COPD, paroxysmal atrial fibrillation, hypertension, diastolic dysfunction EXAM: PORTABLE CHEST 1 VIEW COMPARISON:  Portable exam 1614 hours compared to 05/02/2018 FINDINGS: Normal heart size, mediastinal contours, and pulmonary vascularity. Coronary arterial stent noted. Atherosclerotic calcification aorta. Bibasilar atelectasis greater on RIGHT. Lungs otherwise clear. Underlying emphysematous changes. No pleural effusion or pneumothorax. Bones demineralized with evidence of prior cervical spine surgery and RIGHT glenohumeral degenerative changes. IMPRESSION: COPD changes with bibasilar atelectasis greater on RIGHT. Electronically Signed   By: Lavonia Dana M.D.   On: 06/25/2018 16:31   Ir Sacroplasty Bilateral  Result Date: 06/14/2018 INDICATION: Severe low back pain secondary to sacral insufficiency fractures. EXAM: SACROPLASTY BILATERAL APPROACH MEDICATIONS: As antibiotic prophylaxis, 1 g of vancomycin was ordered pre-procedure and administered intravenously within 1 hour of incision. ANESTHESIA/SEDATION: Moderate (conscious) sedation was employed during this procedure. A total of Versed 1 mg and Fentanyl 25 mcg was administered intravenously. Moderate Sedation Time: 33 minutes. The patient's level of consciousness and vital signs were monitored continuously by radiology nursing throughout the procedure under my direct supervision. FLUOROSCOPY TIME:  Fluoroscopy Time: 10 minutes 24 seconds (6387 mGy) COMPLICATIONS: None immediate.  TECHNIQUE: Informed written consent was obtained from the patient after a thorough discussion of the procedural risks, benefits and alternatives. All questions were addressed. Maximal Sterile Barrier Technique was utilized including caps, mask, sterile gowns, sterile gloves, sterile drape, hand hygiene and skin antiseptic. A timeout was performed prior to the initiation of the procedure. PROCEDURE: The patient was placed prone on the fluoroscopic table. Nasal oxygen was administered. Physiologic monitoring was performed throughout the duration of the procedure. The skin overlying the lumbosacral region was prepped and draped in the usual sterile fashion. The sacral ala were identified and the skin overlying the sacral ala was infiltrated with 0.25% Bupivacaine. This was then followed by the advancement of a 13-gauge Cook needle through the sacral ala and into the anterior 1/3. A gentle contrast injection through  the needles demonstrated early opacification of presacral veins on the right side. This prompted the use of Gelfoam pledgets into the 13 gauge Cook spinal needle on the right. At this time, methylmethacrylate mixture was reconstituted. Under biplane intermittent fluoroscopy, the methylmethacrylate was then injected into the sacral ala with filling of the S1 S2 vertebral body. No extravasation was noted into the disk spaces or posteriorly into the spinal canal. There was slight extrusion of the methylmethacrylate mixture along the superior aspect of the sacrum anteriorly. The needles were then removed. Hemostasis was achieved at the skin entry sites. There were no acute complications. Patient tolerated the procedure well. The patient was observed for 3 hours and discharged in good condition. IMPRESSION: 1. Status post vertebral body augmentation at S1-S2 junction for painful sacral insufficiency fractures using the sacroplasty technique. Electronically Signed   By: Luanne Bras M.D.   On: 06/13/2018  14:19   Dg Hip Unilat With Pelvis 2-3 Views Left  Result Date: 06/25/2018 CLINICAL DATA:  [place] 2 days ago. Left groin pain. EXAM: DG HIP (WITH OR WITHOUT PELVIS) 2-3V LEFT COMPARISON:  None. FINDINGS: Bones are demineralized. SI joints and symphysis pubis unremarkable. Patient is status post bilateral sacral augmentation. Acute fractures noted in the left superior and inferior pubic rami. AP and frog-leg lateral views of the left hip show no femoral neck fracture. IMPRESSION: Acute fractures of the left superior and inferior pubic rami. No left femoral neck fracture. Electronically Signed   By: Misty Stanley M.D.   On: 06/25/2018 14:47     Discharge Exam: Vitals:   07/04/18 0407 07/04/18 0633  BP: (!) 112/39 (!) 121/49  Pulse: 67 68  Resp: 15   Temp: 98.5 F (36.9 C)   SpO2: 95%    Vitals:   07/03/18 2025 07/03/18 2029 07/04/18 0407 07/04/18 0633  BP:  (!) 128/48 (!) 112/39 (!) 121/49  Pulse:  (!) 122 67 68  Resp:  19 15   Temp:  98.6 F (37 C) 98.5 F (36.9 C)   TempSrc:  Oral Oral   SpO2: 96% 97% 95%   Weight:      Height:        General: Pt is alert, awake, not in acute distress Cardiovascular: RRR, S1/S2 +, no rubs, no gallops Respiratory: CTA bilaterally, no wheezing, no rhonchi Abdominal: Soft, NT, ND, bowel sounds + Extremities: no edema, no cyanosis    The results of significant diagnostics from this hospitalization (including imaging, microbiology, ancillary and laboratory) are listed below for reference.     Microbiology: No results found for this or any previous visit (from the past 240 hour(s)).   Labs: BNP (last 3 results) No results for input(s): BNP in the last 8760 hours. Basic Metabolic Panel: Recent Labs  Lab 06/28/18 0342  NA 140  K 3.6  CL 99  CO2 32  GLUCOSE 100*  BUN 18  CREATININE 1.61*  CALCIUM 9.1   Liver Function Tests: No results for input(s): AST, ALT, ALKPHOS, BILITOT, PROT, ALBUMIN in the last 168 hours. No results for  input(s): LIPASE, AMYLASE in the last 168 hours. No results for input(s): AMMONIA in the last 168 hours. CBC: Recent Labs  Lab 07/01/18 0330  WBC 5.7  HGB 10.0*  HCT 31.2*  MCV 107.2*  PLT 256   Cardiac Enzymes: No results for input(s): CKTOTAL, CKMB, CKMBINDEX, TROPONINI in the last 168 hours. BNP: Invalid input(s): POCBNP CBG: No results for input(s): GLUCAP in the last 168 hours. D-Dimer  No results for input(s): DDIMER in the last 72 hours. Hgb A1c No results for input(s): HGBA1C in the last 72 hours. Lipid Profile No results for input(s): CHOL, HDL, LDLCALC, TRIG, CHOLHDL, LDLDIRECT in the last 72 hours. Thyroid function studies No results for input(s): TSH, T4TOTAL, T3FREE, THYROIDAB in the last 72 hours.  Invalid input(s): FREET3 Anemia work up No results for input(s): VITAMINB12, FOLATE, FERRITIN, TIBC, IRON, RETICCTPCT in the last 72 hours. Urinalysis    Component Value Date/Time   COLORURINE YELLOW (A) 12/18/2017 2340   APPEARANCEUR CLOUDY (A) 12/18/2017 2340   LABSPEC 1.013 12/18/2017 2340   PHURINE 5.0 12/18/2017 2340   GLUCOSEU NEGATIVE 12/18/2017 2340   HGBUR NEGATIVE 12/18/2017 2340   BILIRUBINUR NEGATIVE 12/18/2017 2340   KETONESUR NEGATIVE 12/18/2017 2340   PROTEINUR NEGATIVE 12/18/2017 2340   NITRITE NEGATIVE 12/18/2017 2340   LEUKOCYTESUR LARGE (A) 12/18/2017 2340   Sepsis Labs Invalid input(s): PROCALCITONIN,  WBC,  LACTICIDVEN Microbiology No results found for this or any previous visit (from the past 240 hour(s)).   Time coordinating discharge: 35 minutes  SIGNED:   Rodena Goldmann, DO [facility] 07/04/2018, 10:56 AM Pager [phone]  If 7PM-7AM, please contact night-coverage [email or web address] Password [email or web address]

## 2018-07-04 NOTE — Care Management Note (Signed)
Case Management Note  Patient Details  Name: [name] MRN: [id number] Date of Birth: 02-07-1941  Subjective/Objective:   76 yr old female under observation for left pubic rami fracture.          Action/Plan: Patient was initially scheduled to go to [facility] SN[facility] for shortterm rehab. Patient this am says she wants to go home with Home Health. Patient requests [facility] for therapy. CM called referral to Neoma Laming, [facility] Liaison.  Patient says she will have support of her husband at discharge.   Expected Discharge Date:    07/04/18              Expected Discharge Plan:  [place]  In-House Referral:  NA, Clinical Social Work  Discharge planning Services  CM Consult  Post Acute Care Choice:  Home Health Choice offered to:  Patient  DME Arranged:  3-N-1 DME Agency:  NA  HH Arranged:  PT [place]:  [facility]  Status of Service:  Completed, signed off  If discussed at [place] of Stay Meetings, dates discussed:    Additional Comments:  Ninfa Meeker, RN 07/04/2018, 10:41 AM

## 2018-07-04 NOTE — Progress Notes (Signed)
Pt informed me that she did not want to go to a rehab facility. She wants to go home, and have home health. Pt states that she has a wheelchair, walker and bedside commode at home. Informed CSW and case Freight forwarder. MD made aware,

## 2018-07-04 NOTE — Plan of Care (Signed)
Education reinforced about recommendation for SNF. Pt again has refused and instead opted to go home with [facility] PT.Pt continues to have weakness and c/o pain in left hip.

## 2018-07-06 ENCOUNTER — Ambulatory Visit: Admission: RE | Admit: 2018-07-06 | Payer: Medicare Other | Source: Ambulatory Visit

## 2018-07-10 ENCOUNTER — Ambulatory Visit
Admission: RE | Admit: 2018-07-10 | Discharge: 2018-07-10 | Disposition: A | Discharge status: Home / Self Care | Payer: Medicare Other | Source: Ambulatory Visit | Attending: Nephrology | Admitting: Nephrology

## 2018-07-10 DIAGNOSIS — D631 Anemia in chronic kidney disease: Secondary | ICD-10-CM | POA: Diagnosis not present

## 2018-07-10 DIAGNOSIS — N184 Chronic kidney disease, stage 4 (severe): Secondary | ICD-10-CM | POA: Insufficient documentation

## 2018-07-10 LAB — IRON AND TIBC
Iron: 36 ug/dL (ref 28–170)
Saturation Ratios: 15 % (ref 10.4–31.8)
TIBC: 237 ug/dL — ABNORMAL LOW (ref 250–450)
UIBC: 201 ug/dL

## 2018-07-10 LAB — FERRITIN: Ferritin: 567 ng/mL — ABNORMAL HIGH (ref 11–307)

## 2018-07-10 LAB — TRANSFERRIN: Transferrin: 162 mg/dL — ABNORMAL LOW (ref 192–382)

## 2018-07-10 LAB — HEMOGLOBIN: HEMOGLOBIN: 12.4 g/dL (ref 12.0–15.0)

## 2018-07-17 ENCOUNTER — Ambulatory Visit: Payer: Medicare Other

## 2018-07-24 ENCOUNTER — Ambulatory Visit
Admission: RE | Admit: 2018-07-24 | Discharge: 2018-07-24 | Disposition: A | Discharge status: Home / Self Care | Payer: Medicare Other | Source: Ambulatory Visit | Attending: Nephrology | Admitting: Nephrology

## 2018-07-24 DIAGNOSIS — N184 Chronic kidney disease, stage 4 (severe): Secondary | ICD-10-CM | POA: Insufficient documentation

## 2018-07-24 DIAGNOSIS — D631 Anemia in chronic kidney disease: Secondary | ICD-10-CM | POA: Diagnosis not present

## 2018-07-24 LAB — HEMOGLOBIN: Hemoglobin: 11.5 g/dL — ABNORMAL LOW (ref 12.0–15.0)

## 2018-07-24 MED ORDER — EPOETIN ALFA-EPBX 40000 UNIT/ML IJ SOLN
30000.0000 [IU] | Freq: Once | INTRAMUSCULAR | Status: AC
  Administered 2018-07-24: 30000 [IU] via SUBCUTANEOUS
  Filled 2018-07-24: qty 1

## 2018-07-25 ENCOUNTER — Other Ambulatory Visit: Payer: Self-pay | Admitting: Cardiovascular Disease

## 2018-08-01 ENCOUNTER — Other Ambulatory Visit: Payer: Self-pay | Admitting: Nurse Practitioner

## 2018-08-01 ENCOUNTER — Other Ambulatory Visit (HOSPITAL_COMMUNITY): Payer: Self-pay | Admitting: Nurse Practitioner

## 2018-08-01 DIAGNOSIS — R519 Headache, unspecified: Secondary | ICD-10-CM

## 2018-08-01 DIAGNOSIS — R51 Headache: Secondary | ICD-10-CM

## 2018-08-01 DIAGNOSIS — Z9181 History of falling: Secondary | ICD-10-CM

## 2018-08-07 ENCOUNTER — Ambulatory Visit
Admission: RE | Admit: 2018-08-07 | Discharge: 2018-08-07 | Disposition: A | Discharge status: Home / Self Care | Payer: Medicare Other | Source: Ambulatory Visit | Attending: Nephrology | Admitting: Nephrology

## 2018-08-07 DIAGNOSIS — D631 Anemia in chronic kidney disease: Secondary | ICD-10-CM | POA: Insufficient documentation

## 2018-08-07 DIAGNOSIS — N184 Chronic kidney disease, stage 4 (severe): Secondary | ICD-10-CM | POA: Diagnosis not present

## 2018-08-07 LAB — HEMOGLOBIN: Hemoglobin: 12.3 g/dL (ref 12.0–15.0)

## 2018-08-07 LAB — IRON AND TIBC
IRON: 121 ug/dL (ref 28–170)
SATURATION RATIOS: 59 % — AB (ref 10.4–31.8)
TIBC: 207 ug/dL — ABNORMAL LOW (ref 250–450)
UIBC: 86 ug/dL

## 2018-08-07 LAB — TRANSFERRIN: Transferrin: 148 mg/dL — ABNORMAL LOW (ref 192–382)

## 2018-08-07 LAB — FERRITIN: Ferritin: 412 ng/mL — ABNORMAL HIGH (ref 11–307)

## 2018-08-15 ENCOUNTER — Inpatient Hospital Stay: Admission: RE | Admit: 2018-08-15 | Payer: Medicare Other | Source: Ambulatory Visit

## 2018-08-18 ENCOUNTER — Ambulatory Visit (HOSPITAL_COMMUNITY)
Admission: RE | Admit: 2018-08-18 | Discharge: 2018-08-18 | Disposition: A | Discharge status: Home / Self Care | Payer: Medicare Other | Source: Ambulatory Visit | Attending: Nurse Practitioner | Admitting: Nurse Practitioner

## 2018-08-18 DIAGNOSIS — R51 Headache: Secondary | ICD-10-CM | POA: Insufficient documentation

## 2018-08-18 DIAGNOSIS — Z9181 History of falling: Secondary | ICD-10-CM | POA: Insufficient documentation

## 2018-08-18 DIAGNOSIS — R519 Headache, unspecified: Secondary | ICD-10-CM

## 2018-08-21 ENCOUNTER — Ambulatory Visit
Admission: RE | Admit: 2018-08-21 | Discharge: 2018-08-21 | Disposition: A | Discharge status: Home / Self Care | Payer: Medicare Other | Source: Ambulatory Visit | Attending: Nephrology | Admitting: Nephrology

## 2018-08-21 DIAGNOSIS — N184 Chronic kidney disease, stage 4 (severe): Secondary | ICD-10-CM | POA: Insufficient documentation

## 2018-08-21 DIAGNOSIS — D631 Anemia in chronic kidney disease: Secondary | ICD-10-CM | POA: Insufficient documentation

## 2018-08-21 LAB — HEMOGLOBIN: Hemoglobin: 12.4 g/dL (ref 12.0–15.0)

## 2018-08-22 ENCOUNTER — Encounter: Payer: Self-pay | Admitting: *Deleted

## 2018-08-22 ENCOUNTER — Ambulatory Visit
Admission: RE | Admit: 2018-08-22 | Discharge: 2018-08-22 | Disposition: A | Discharge status: Home / Self Care | Payer: Medicare Other | Source: Ambulatory Visit | Attending: Nurse Practitioner | Admitting: Nurse Practitioner

## 2018-08-22 ENCOUNTER — Telehealth: Payer: Self-pay | Admitting: *Deleted

## 2018-08-22 DIAGNOSIS — M542 Cervicalgia: Secondary | ICD-10-CM

## 2018-08-22 DIAGNOSIS — I739 Peripheral vascular disease, unspecified: Secondary | ICD-10-CM

## 2018-08-22 MED ORDER — TRIAMCINOLONE ACETONIDE 40 MG/ML IJ SUSP (RADIOLOGY)
60.0000 mg | Freq: Once | INTRAMUSCULAR | Status: AC
  Administered 2018-08-22: 60 mg via EPIDURAL

## 2018-08-22 MED ORDER — IOHEXOL 300 MG/ML  SOLN
1.0000 mL | Freq: Once | INTRAMUSCULAR | Status: AC | PRN
  Administered 2018-08-22: 1 mL via EPIDURAL

## 2018-08-22 NOTE — Telephone Encounter (Signed)
Orders placed. Routing back to scheduling to schedule procedures and appointment with Dr Fletcher Anon.  Will mail letter with the below instructions to patient for the procedures.  AORTA/IVC/ILIACS  No food after 11PM the night before.  Water is OK. (Don't drink liquids if you have been instructed not to for ANOTHER test).  Take two Extra-Strength Gas-X capsules at bedtime the night before test.   Take an additional two Extra-Strength Gas-X capsules three (3) hours before the test or first thing in the morning.    Avoid foods that produce bowel gas, for 24 hours prior to exam (see below).    No breakfast, no chewing gum, no smoking or carbonated beverages.  Patient may take morning medications with water.  Come in for test at least 15 minutes early to register.

## 2018-08-22 NOTE — Telephone Encounter (Signed)
I want her to have an aortoiliac duplex and ABI  before her visit with me.

## 2018-08-22 NOTE — Telephone Encounter (Signed)
Patient overdue to see Dr Fletcher Anon. Last ABI/LEA was 10/2017. Patient would like to have another one done prior to scheduling an appointment with Dr Jacklynn Ganong needs. Routing to Dr Fletcher Anon to advise.

## 2018-08-22 NOTE — Discharge Instructions (Signed)

## 2018-08-22 NOTE — Telephone Encounter (Signed)
-----   Message from Clarisse Gouge sent at 08/22/2018 11:33 AM EST ----- Regarding: Vascular testing order Patient requesting repeat LEA prior to recall with [facility].   Please advise scheduling.   Thanks  [employer]

## 2018-08-23 ENCOUNTER — Other Ambulatory Visit: Payer: Self-pay

## 2018-08-23 MED ORDER — ISOSORBIDE MONONITRATE ER 60 MG PO TB24: ORAL_TABLET | ORAL | 2 refills | Status: DC

## 2018-08-23 NOTE — Telephone Encounter (Signed)
[name] ,     Can you advise if needs 60 or 120 appt for the vascular study .   Thanks  [employer]

## 2018-08-23 NOTE — Telephone Encounter (Signed)
Requested Prescriptions   Signed Prescriptions Disp Refills  . isosorbide mononitrate (IMDUR) 60 MG 24 hr tablet 180 tablet 2    Sig: TAKE ONE TABLET (60MG  TOTAL) BY MOUTH TWO TIMES DAILY    Authorizing Provider: Minna Merritts    Ordering User: Britt Bottom

## 2018-08-23 NOTE — Telephone Encounter (Signed)
*  STAT* If patient is at the pharmacy, call can be transferred to refill team.   1. Which medications need to be refilled? (please list name of each medication and dose if known) ISOSORBIDE  2. Which pharmacy/location (including street and city if local pharmacy) is medication to be sent to?  PHARMACY  3. Do they need a 30 day or 90 day supply? [place]

## 2018-08-30 ENCOUNTER — Telehealth: Payer: Self-pay | Admitting: Cardiovascular Disease

## 2018-08-30 NOTE — Telephone Encounter (Signed)
Patient called to find out what the Aorta/Iliac duplex and ABI consisted of. She has been educated on the two tests and verbalized her understanding.

## 2018-08-30 NOTE — Telephone Encounter (Signed)
New Message  Pt verbalized she received a test in the mail and would like to speak to [facility], Dr. Tyrell Antonio nurse about the test and her arteries.  Please f/u

## 2018-09-04 ENCOUNTER — Ambulatory Visit
Admission: RE | Admit: 2018-09-04 | Discharge: 2018-09-04 | Disposition: A | Discharge status: Home / Self Care | Payer: Medicare Other | Source: Ambulatory Visit | Attending: Nephrology | Admitting: Nephrology

## 2018-09-04 DIAGNOSIS — N184 Chronic kidney disease, stage 4 (severe): Secondary | ICD-10-CM | POA: Diagnosis not present

## 2018-09-04 DIAGNOSIS — D631 Anemia in chronic kidney disease: Secondary | ICD-10-CM | POA: Diagnosis not present

## 2018-09-04 LAB — FERRITIN: Ferritin: 258 ng/mL (ref 11–307)

## 2018-09-04 LAB — HEMOGLOBIN: HEMOGLOBIN: 11.8 g/dL — AB (ref 12.0–15.0)

## 2018-09-04 LAB — TRANSFERRIN: Transferrin: 188 mg/dL — ABNORMAL LOW (ref 192–382)

## 2018-09-04 LAB — IRON AND TIBC
IRON: 73 ug/dL (ref 28–170)
Saturation Ratios: 28 % (ref 10.4–31.8)
TIBC: 262 ug/dL (ref 250–450)
UIBC: 189 ug/dL

## 2018-09-04 MED ORDER — EPOETIN ALFA-EPBX 40000 UNIT/ML IJ SOLN
30000.0000 [IU] | Freq: Once | INTRAMUSCULAR | Status: DC
  Filled 2018-09-04: qty 1

## 2018-09-04 MED ORDER — EPOETIN ALFA-EPBX 10000 UNIT/ML IJ SOLN
INTRAMUSCULAR | Status: AC
  Administered 2018-09-04: 30000 [IU]
  Filled 2018-09-04: qty 1

## 2018-09-18 ENCOUNTER — Ambulatory Visit
Admission: RE | Admit: 2018-09-18 | Discharge: 2018-09-18 | Disposition: A | Discharge status: Home / Self Care | Payer: Medicare Other | Source: Ambulatory Visit | Attending: Nephrology | Admitting: Nephrology

## 2018-09-18 DIAGNOSIS — D631 Anemia in chronic kidney disease: Secondary | ICD-10-CM | POA: Insufficient documentation

## 2018-09-18 DIAGNOSIS — N184 Chronic kidney disease, stage 4 (severe): Secondary | ICD-10-CM | POA: Insufficient documentation

## 2018-09-18 LAB — HEMOGLOBIN: Hemoglobin: 13.2 g/dL (ref 12.0–15.0)

## 2018-09-19 ENCOUNTER — Other Ambulatory Visit: Payer: Self-pay | Admitting: Cardiovascular Disease

## 2018-09-19 DIAGNOSIS — I739 Peripheral vascular disease, unspecified: Secondary | ICD-10-CM

## 2018-09-26 ENCOUNTER — Telehealth: Payer: Self-pay | Admitting: Cardiovascular Disease

## 2018-09-26 NOTE — Telephone Encounter (Signed)
Pt cancelled aorta and abi ultrasounds. Please asses when and urgency pt needs to be r/s

## 2018-10-02 ENCOUNTER — Other Ambulatory Visit: Payer: Self-pay

## 2018-10-02 ENCOUNTER — Ambulatory Visit
Admission: RE | Admit: 2018-10-02 | Discharge: 2018-10-02 | Disposition: A | Discharge status: Home / Self Care | Payer: Medicare Other | Source: Ambulatory Visit | Attending: Nephrology | Admitting: Nephrology

## 2018-10-02 DIAGNOSIS — D631 Anemia in chronic kidney disease: Secondary | ICD-10-CM | POA: Diagnosis not present

## 2018-10-02 DIAGNOSIS — N184 Chronic kidney disease, stage 4 (severe): Secondary | ICD-10-CM | POA: Diagnosis present

## 2018-10-02 LAB — HEMOGLOBIN: Hemoglobin: 13.2 g/dL (ref 12.0–15.0)

## 2018-10-02 LAB — IRON AND TIBC
Iron: 59 ug/dL (ref 28–170)
SATURATION RATIOS: 19 % (ref 10.4–31.8)
TIBC: 304 ug/dL (ref 250–450)
UIBC: 245 ug/dL

## 2018-10-02 LAB — TRANSFERRIN: Transferrin: 216 mg/dL (ref 192–382)

## 2018-10-02 LAB — FERRITIN: Ferritin: 315 ng/mL — ABNORMAL HIGH (ref 11–307)

## 2018-10-04 NOTE — Telephone Encounter (Signed)
Call placed to the patient about her cancelled Aorta and ABI. She stated that she was told that it had to be cancelled due to the virus. She stated that her legs feel worse than they have previously and would like to have these completed prior to her office visit on 11/02/2018 (pending the virus status) Message routed to the provider for recommendations.

## 2018-10-04 NOTE — Telephone Encounter (Signed)
Left a message for the patient to call back.  

## 2018-10-04 NOTE — Telephone Encounter (Signed)
It will all depend on the virus surge which is expected in our community is at the end of April.  Realistically, we can do her vascular studies in mid May and follow-up with me after.

## 2018-10-05 NOTE — Telephone Encounter (Signed)
The patient has been moved to 11/30/2018 with Dr. Fletcher Anon. She will need her aorta/iliac and abi completed prior. Message sent to schedulers.

## 2018-10-05 NOTE — Telephone Encounter (Signed)
Left a message for the patient to call back to move her appointment with Dr. Fletcher Anon to the end of May with the dopplers prior.

## 2018-10-06 NOTE — Telephone Encounter (Signed)
lmov to schedule vascular testing

## 2018-10-16 ENCOUNTER — Ambulatory Visit
Admission: RE | Admit: 2018-10-16 | Discharge: 2018-10-16 | Disposition: A | Discharge status: Home / Self Care | Payer: Medicare Other | Source: Ambulatory Visit | Attending: Nephrology | Admitting: Nephrology

## 2018-10-16 ENCOUNTER — Other Ambulatory Visit: Payer: Self-pay

## 2018-10-16 DIAGNOSIS — D631 Anemia in chronic kidney disease: Secondary | ICD-10-CM | POA: Insufficient documentation

## 2018-10-16 DIAGNOSIS — N184 Chronic kidney disease, stage 4 (severe): Secondary | ICD-10-CM | POA: Diagnosis present

## 2018-10-16 LAB — HEMOGLOBIN: Hemoglobin: 12.5 g/dL (ref 12.0–15.0)

## 2018-10-16 MED ORDER — EPOETIN ALFA-EPBX 40000 UNIT/ML IJ SOLN: 30000.0000 [IU] | Freq: Once | INTRAMUSCULAR | Status: DC

## 2018-10-16 MED ORDER — EPOETIN ALFA-EPBX 10000 UNIT/ML IJ SOLN: 30000.0000 [IU] | Freq: Once | INTRAMUSCULAR | Status: DC

## 2018-10-16 NOTE — OR Nursing (Signed)
HGB 12.5 - injection held as ordered.

## 2018-10-30 ENCOUNTER — Ambulatory Visit: Payer: Medicare Other

## 2018-11-02 ENCOUNTER — Ambulatory Visit: Payer: Medicare Other | Admitting: Cardiovascular Disease

## 2018-11-06 ENCOUNTER — Ambulatory Visit
Admission: RE | Admit: 2018-11-06 | Discharge: 2018-11-06 | Disposition: A | Discharge status: Home / Self Care | Payer: Medicare Other | Source: Ambulatory Visit | Attending: Nephrology | Admitting: Nephrology

## 2018-11-06 ENCOUNTER — Other Ambulatory Visit: Payer: Self-pay

## 2018-11-06 DIAGNOSIS — D631 Anemia in chronic kidney disease: Secondary | ICD-10-CM | POA: Insufficient documentation

## 2018-11-06 DIAGNOSIS — N184 Chronic kidney disease, stage 4 (severe): Secondary | ICD-10-CM | POA: Diagnosis not present

## 2018-11-06 LAB — TRANSFERRIN: Transferrin: 179 mg/dL — ABNORMAL LOW (ref 192–382)

## 2018-11-06 LAB — IRON AND TIBC
Iron: 61 ug/dL (ref 28–170)
Saturation Ratios: 25 % (ref 10.4–31.8)
TIBC: 246 ug/dL — ABNORMAL LOW (ref 250–450)
UIBC: 185 ug/dL

## 2018-11-06 LAB — HEMOGLOBIN: Hemoglobin: 11.9 g/dL — ABNORMAL LOW (ref 12.0–15.0)

## 2018-11-06 LAB — FERRITIN: Ferritin: 369 ng/mL — ABNORMAL HIGH (ref 11–307)

## 2018-11-06 MED ORDER — EPOETIN ALFA-EPBX 10000 UNIT/ML IJ SOLN
INTRAMUSCULAR | Status: AC
  Filled 2018-11-06: qty 3

## 2018-11-06 MED ORDER — EPOETIN ALFA-EPBX 40000 UNIT/ML IJ SOLN
30000.0000 [IU] | Freq: Once | INTRAMUSCULAR | Status: AC
  Administered 2018-11-06: 30000 [IU] via SUBCUTANEOUS
  Filled 2018-11-06: qty 1

## 2018-11-07 ENCOUNTER — Telehealth: Payer: Self-pay

## 2018-11-07 ENCOUNTER — Telehealth: Payer: Self-pay | Admitting: Cardiovascular Disease

## 2018-11-07 NOTE — Telephone Encounter (Signed)
Called patient.  No answer. LMOV.  Need to change to Evisit.

## 2018-11-07 NOTE — Telephone Encounter (Signed)
Virtual Visit Pre-Appointment Phone Call  "(Name), I am calling you today to discuss your upcoming appointment. We are currently trying to limit exposure to the virus that causes COVID-19 by seeing patients at home rather than in the office."  1. "What is the BEST phone number to call the day of the visit?" - include this in appointment notes  2. Do you have or have access to (through a family member/friend) a smartphone with video capability that we can use for your visit?" a. If yes - list this number in appt notes as cell (if different from BEST phone #) and list the appointment type as a VIDEO visit in appointment notes b. If no - list the appointment type as a PHONE visit in appointment notes  3. Confirm consent - "In the setting of the current Covid19 crisis, you are scheduled for a (phone or video) visit with your provider on (date) at (time).  Just as we do with many in-office visits, in order for you to participate in this visit, we must obtain consent.  If you'd like, I can send this to your mychart (if signed up) or email for you to review.  Otherwise, I can obtain your verbal consent now.  All virtual visits are billed to your insurance company just like a normal visit would be.  By agreeing to a virtual visit, we'd like you to understand that the technology does not allow for your provider to perform an examination, and thus may limit your provider's ability to fully assess your condition. If your provider identifies any concerns that need to be evaluated in person, we will make arrangements to do so.  Finally, though the technology is pretty good, we cannot assure that it will always work on either your or our end, and in the setting of a video visit, we may have to convert it to a phone-only visit.  In either situation, we cannot ensure that we have a secure connection.  Are you willing to proceed?" STAFF: Did the patient verbally acknowledge consent to telehealth visit? Document  YES/NO here: Yes   4. Advise patient to be prepared - "Two hours prior to your appointment, go ahead and check your blood pressure, pulse, oxygen saturation, and your weight (if you have the equipment to check those) and write them all down. When your visit starts, your provider will ask you for this information. If you have an Apple Watch or Kardia device, please plan to have heart rate information ready on the day of your appointment. Please have a pen and paper handy nearby the day of the visit as well."  5. Give patient instructions for MyChart download to smartphone OR Doximity/Doxy.me as below if video visit (depending on what platform provider is using)  6. Inform patient they will receive a phone call 15 minutes prior to their appointment time (may be from unknown caller ID) so they should be prepared to answer    TELEPHONE CALL NOTE  [name] has been deemed a candidate for a follow-up tele-health visit to limit community exposure during the Covid-19 pandemic. I spoke with the patient via phone to ensure availability of phone/video source, confirm preferred email & phone number, and discuss instructions and expectations.  I reminded [name] to be prepared with any vital sign and/or heart rhythm information that could potentially be obtained via home monitoring, at the time of her visit. I reminded [name] to expect a phone call prior  to her visit.  Ace Gins 11/07/2018 2:35 PM   INSTRUCTIONS FOR DOWNLOADING THE MYCHART APP TO SMARTPHONE  - The patient must first make sure to have activated MyChart and know their login information - If Apple, go to [employer] and type in MyChart in the search bar and download the app. If Android, ask patient to go to [employer] and type in [place] in the search bar and download the app. The app is free but as with any other app downloads, their phone may require them to verify saved payment information or Apple/Android  password.  - The patient will need to then log into the app with their MyChart username and password, and select [facility] as their healthcare provider to link the account. When it is time for your visit, go to the MyChart app, find appointments, and click Begin Video Visit. Be sure to Select Allow for your device to access the Microphone and Camera for your visit. You will then be connected, and your provider will be with you shortly.  **If they have any issues connecting, or need assistance please contact MyChart service desk [phone])**  **If using a computer, in order to ensure the best quality for their visit they will need to use either of the following Internet Browsers: [employer], or Google Chrome**  IF USING DOXIMITY or DOXY.ME - The patient will receive a link just prior to their visit by text.     FULL LENGTH CONSENT FOR TELE-HEALTH VISIT   I hereby voluntarily request, consent and authorize [facility] and its employed or contracted physicians, physician assistants, nurse practitioners or other licensed health care professionals (the Practitioner), to provide me with telemedicine health care services (the Services") as deemed necessary by the treating Practitioner. I acknowledge and consent to receive the Services by the Practitioner via telemedicine. I understand that the telemedicine visit will involve communicating with the Practitioner through live audiovisual communication technology and the disclosure of certain medical information by electronic transmission. I acknowledge that I have been given the opportunity to request an in-person assessment or other available alternative prior to the telemedicine visit and am voluntarily participating in the telemedicine visit.  I understand that I have the right to withhold or withdraw my consent to the use of telemedicine in the course of my care at any time, without affecting my right to future care or treatment,  and that the Practitioner or I may terminate the telemedicine visit at any time. I understand that I have the right to inspect all information obtained and/or recorded in the course of the telemedicine visit and may receive copies of available information for a reasonable fee.  I understand that some of the potential risks of receiving the Services via telemedicine include:   Delay or interruption in medical evaluation due to technological equipment failure or disruption;  Information transmitted may not be sufficient (e.g. poor resolution of images) to allow for appropriate medical decision making by the Practitioner; and/or   In rare instances, security protocols could fail, causing a breach of personal health information.  Furthermore, I acknowledge that it is my responsibility to provide information about my medical history, conditions and care that is complete and accurate to the best of my ability. I acknowledge that Practitioner's advice, recommendations, and/or decision may be based on factors not within their control, such as incomplete or inaccurate data provided by me or distortions of diagnostic images or specimens that may result from electronic transmissions. I  understand that the practice of medicine is not an exact science and that Practitioner makes no warranties or guarantees regarding treatment outcomes. I acknowledge that I will receive a copy of this consent concurrently upon execution via email to the email address I last provided but may also request a printed copy by calling the office of [facility].    I understand that my insurance will be billed for this visit.   I have read or had this consent read to me.  I understand the contents of this consent, which adequately explains the benefits and risks of the Services being provided via telemedicine.   I have been provided ample opportunity to ask questions regarding this consent and the Services and have had my questions  answered to my satisfaction.  I give my informed consent for the services to be provided through the use of telemedicine in my medical care  By participating in this telemedicine visit I agree to the above.

## 2018-11-16 ENCOUNTER — Other Ambulatory Visit: Payer: Self-pay

## 2018-11-16 ENCOUNTER — Telehealth (INDEPENDENT_AMBULATORY_CARE_PROVIDER_SITE_OTHER): Payer: Medicare Other | Admitting: Cardiovascular Disease

## 2018-11-16 VITALS — BP 145/80 | HR 53 | Ht 67.0 in | Wt 170.0 lb

## 2018-11-16 DIAGNOSIS — I25118 Atherosclerotic heart disease of native coronary artery with other forms of angina pectoris: Secondary | ICD-10-CM | POA: Diagnosis not present

## 2018-11-16 DIAGNOSIS — I480 Paroxysmal atrial fibrillation: Secondary | ICD-10-CM

## 2018-11-16 DIAGNOSIS — N183 Chronic kidney disease, stage 3 unspecified: Secondary | ICD-10-CM

## 2018-11-16 DIAGNOSIS — I739 Peripheral vascular disease, unspecified: Secondary | ICD-10-CM

## 2018-11-16 DIAGNOSIS — R0602 Shortness of breath: Secondary | ICD-10-CM

## 2018-11-16 DIAGNOSIS — J432 Centrilobular emphysema: Secondary | ICD-10-CM | POA: Diagnosis not present

## 2018-11-16 DIAGNOSIS — I10 Essential (primary) hypertension: Secondary | ICD-10-CM

## 2018-11-16 DIAGNOSIS — E782 Mixed hyperlipidemia: Secondary | ICD-10-CM

## 2018-11-16 MED ORDER — AZITHROMYCIN 250 MG PO TABS: ORAL_TABLET | ORAL | 1 refills | Status: DC

## 2018-11-16 NOTE — Patient Instructions (Addendum)
Medication Instructions:  Your physician has recommended you make the following change in your medication:  1. START Azithromycin (Z-Pack) 250 mg Take 2 tablets the first day and then 1 tablet daily for the remaining 4 days.   If you need a refill on your cardiac medications before your next appointment, please call your pharmacy.    Lab work: No new labs needed   If you have labs (blood work) drawn today and your tests are completely normal, you will receive your results only by: Marland Kitchen MyChart Message (if you have MyChart) OR . A paper copy in the mail If you have any lab test that is abnormal or we need to change your treatment, we will call you to review the results.   Testing/Procedures: No new testing needed   Follow-Up: At [facility], you and your health needs are our priority.  As part of our continuing mission to provide you with exceptional heart care, we have created designated Provider Care Teams.  These Care Teams include your primary Cardiologist (physician) and Advanced Practice Providers (APPs -  Physician Assistants and Nurse Practitioners) who all work together to provide you with the care you need, when you need it.  . You will need a follow up appointment in 6 months .   Please call our office 2 months in advance to schedule this appointment.    . Providers on your designated Care Team:   . Murray Hodgkins, NP . Christell Faith, PA-C . Marrianne Mood, PA-C  Any Other Special Instructions Will Be Listed Below (If Applicable).  For educational health videos Log in to : [email or web address] Or : [email or web address], password : [email or web address]

## 2018-11-16 NOTE — Progress Notes (Signed)
Virtual Visit via Telephone Note   This visit type was conducted due to national recommendations for restrictions regarding the COVID-19 Pandemic (e.g. social distancing) in an effort to limit this patient's exposure and mitigate transmission in our community.  Due to her co-morbid illnesses, this patient is at least at moderate risk for complications without adequate follow up.  This format is felt to be most appropriate for this patient at this time.  The patient did not have access to video technology/had technical difficulties with video requiring transitioning to audio format only (telephone).  All issues noted in this document were discussed and addressed.  No physical exam could be performed with this format.  Please refer to the patient's chart for her  consent to telehealth for [facility].   I connected with  [name] on 11/16/18 by a video enabled telemedicine application and verified that I am speaking with the correct person using two identifiers. I discussed the limitations of evaluation and management by telemedicine. The patient expressed understanding and agreed to proceed.   Evaluation Performed:  Follow-up visit  Date:  11/16/2018   ID:  [name], DOB 10-14-1940, MRN [id number]  Patient Location:  [place] [address]   Provider location:   [facility], [place] office  PCP:  Lemmie Evens, MD  Cardiologist:  [facility]   Chief Complaint:  SOB, Cough    History of Present Illness:    [name] is a 78 y.o. female who presents via audio/video conferencing for a telehealth visit today.   The patient does not symptoms concerning for COVID-19 infection (fever, chills, cough, or new SHORTNESS OF BREATH).   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 Jimmy Footman in [place]  reports that she is DNR/DNI, she has told this to family, has papers in place who presents for routine followup of her coronary artery disease And leg swelling.  On our discussion today, she reports that she is living alone "No help at home" Worsening shortness of breath, cough, wheezing over the past 2 weeks "I need a z-pak" Using her neb machine, every 4 hours "can't do prednisone" Reports that she tried prednisone in the past and it caused tachycardia  Wakes up at 4 Am, unclear reason Denies anxiety  Needs Knee and shoulder surgery  "no leg swelling" Does not feel that she has extra fluid, with her Lasix 40 daily   chronic severe back pain, better with shots Able to move around  Long discussion concerning anticoagulation  Off plavix and asa, pradaxa : "Kidney doctor stopped it" creatinine 2.36 in July 2019  Recent CR 1.6 in dec 2019  Denies any tachycardia concerning for arrhythmia, atrial fibrillation  Other past medical history reviewed Xray:05/19/2018  Acute sacral fracture with presacral edema. Stable old mild T12 and L1 compression fractures. Stable grade 1 L1-2 RIGHT retrolisthesis and grade 1 L4-5 anterolisthesis without spondylolysis. 3. 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 Moderate canal stenosis L2-3. 4. Neural foraminal  narrowing L2-3 through L5-S1: Moderate to severe L3-4 and L4-5.   Prior CV studies:   The following studies were reviewed today:    Past Medical History:  Diagnosis Date  . Anemia   . CAD (coronary artery disease)    a. 10/2002 PCI: LAD 27m (2.75x23 Cypher DES); b. 09/2004 Cath: LM nl, LAD 40p, patent  stent, D1 30p, LCX 20p, diff dzs throughout, OM1 50p, OM2 60p, OM3 40p, RCA 40p-->Med Rx; c. 10/2012 MV: No ischemia/infarct.  . Carotid artery disease ([place])   . Cervical disc disease   . CKD (chronic kidney disease), stage III ([place])   . Closed fracture of multiple pubic rami ([place]) 06/24/2018  . COPD (chronic obstructive pulmonary disease) ([place])   . Diastolic dysfunction    a. 03/2017 Echo: Ef 60-65%, Gr2 DD, mild MR, mildly dil LA/RA, mildly to mod dil RV w/ nl fxn, PASP 83mmHg.  Marland Kitchen Dysphagia   . GERD (gastroesophageal reflux disease)   . History of kidney stones   . Hypertension    2002  . Hypertriglyceridemia   . Hypothyroidism   . PAD (peripheral artery disease) ([place])    a. 10/2017 ABI's R = 0.78, L = 0.88. Duplex showed borderline R CFA and SFA dzs->med rx.  Marland Kitchen PAF (paroxysmal atrial fibrillation) ([place])    a. Amio/Pradaxa (CHA2DS2VASc = 6).  Marland Kitchen PONV (postoperative nausea and vomiting)   . Right renal artery stenosis ([place])    a. 09/2017 Renal Artery Duplex: no significant LRA stenosis. RRA <60%.   Past Surgical History:  Procedure Laterality Date  . ABDOMINAL HYSTERECTOMY    . AMPUTATION Right 04/12/2014   Procedure: Revision AMPUTATION Right Long DIGIT;  Surgeon: Leanora Cover, MD;  Location: [facility];  Service: Orthopedics;  Laterality: Right;  . APPLICATION OF WOUND VAC Left 01/06/2018   Procedure: APPLICATION OF WOUND VAC;  Surgeon: Samara Deist, DPM;  Location: [facility];  Service: Podiatry;  Laterality: Left;  . BALLOON DILATION  05/05/2011   Procedure: BALLOON DILATION;  Surgeon: Rogene Houston, MD;  [facility];  Service: Endoscopy;  Laterality: N/A;  . CARDIAC CATHETERIZATION     with stent 2004  CYPHER 2.75 x 28mm coronary stent in the mid left anterior descending stenotic lesion    Last stress test showed EF of 775  . CARDIOVERSION    . CATARACT EXTRACTION Left 2002  . CATARACT EXTRACTION W/PHACO Right 05/14/2013   Procedure: CATARACT EXTRACTION PHACO AND  INTRAOCULAR LENS PLACEMENT RIGHT EYE;  Surgeon: Tonny Branch, MD;  Location[facility];  Service: Ophthalmology;  Laterality: Right;  CDE:  20.45  . CERVICAL DISC SURGERY    . CHOLECYSTECTOMY    . CORONARY ANGIOPLASTY     1 stent  . ESOPHAGEAL DILATION N/A 04/25/2015   Procedure: ESOPHAGEAL DILATION;  Surgeon: Rogene Houston, MD;  [facility];  Service: Endoscopy;  Laterality: N/A;  . ESOPHAGOGASTRODUODENOSCOPY N/A 04/25/2015   Procedure: ESOPHAGOGASTRODUODENOSCOPY (EGD);  Surgeon: Rogene Houston, MD;  [facility];  Service: Endoscopy;  Laterality: N/A;  1225  . IR SACROPLASTY BILATERAL  06/13/2018  . IRRIGATION AND DEBRIDEMENT FOOT Left 01/06/2018   Procedure: IRRIGATION AND DEBRIDEMENT FOOT;  Surgeon: Samara Deist, DPM;  Location: [facility];  Service: Podiatry;  Laterality: Left;  Marland Kitchen MALONEY DILATION  05/05/2011   Procedure: MALONEY DILATION;  Surgeon: Rogene Houston, MD;  [facility];  Service: Endoscopy;  Laterality: N/A;  . right knee arthroscopy  2010  .  SAVORY DILATION  05/05/2011   Procedure: SAVORY DILATION;  Surgeon: Rogene Houston, MD;  [facility];  Service: Endoscopy;  Laterality: N/A;  . SHOULDER SURGERY Right    torn rotator cuff  . TOENAIL EXCISION    . TOTAL ABDOMINAL HYSTERECTOMY W/ BILATERAL SALPINGOOPHORECTOMY       Current Meds  Medication Sig  . acetaminophen (TYLENOL) 500 MG tablet Take 500-1,000 mg by mouth 2 (two) times daily as needed for headache (pain). Lunch and bedtime  . albuterol (PROVENTIL HFA;VENTOLIN HFA) 108 (90 Base) MCG/ACT inhaler Inhale 2 puffs into the lungs every 4 (four) hours as needed for wheezing or shortness of breath.  Marland Kitchen albuterol (PROVENTIL) (2.5 MG/3ML) 0.083% nebulizer solution Take 2.5 mg by nebulization every 6 (six) hours as needed for shortness of breath.   . Ascorbic Acid (VITAMIN C) 1000 MG tablet Take 1,000 mg by mouth daily.  . bisacodyl (DULCOLAX) 10 MG suppository Place 1 suppository  (10 mg total) rectally daily as needed for moderate constipation.  . calcitRIOL (ROCALTROL) 0.5 MCG capsule Take 0.5 mcg by mouth every other day.   . cetirizine (ZYRTEC) 10 MG tablet Take 10 mg by mouth daily.   . clonazePAM (KLONOPIN) 1 MG tablet Take 2-3 mg by mouth at bedtime.   . diphenhydrAMINE (BENADRYL) 25 mg capsule Take 1 capsule (25 mg total) by mouth every 4 (four) hours as needed for itching.  . Fluticasone-Salmeterol (ADVAIR) 500-50 MCG/DOSE AEPB Inhale 1 puff into the lungs 2 (two) times daily.  . furosemide (LASIX) 40 MG tablet Take 40 mg by mouth daily.  . isosorbide mononitrate (IMDUR) 60 MG 24 hr tablet TAKE ONE TABLET (60MG  TOTAL) BY MOUTH TWO TIMES DAILY  . levothyroxine (SYNTHROID, LEVOTHROID) 112 MCG tablet Take 100 mcg by mouth daily before breakfast.   . nitroGLYCERIN (NITROLINGUAL) 0.4 MG/SPRAY spray PLACE ONE SPRAY UNDER THE TONGUE EVERY 5MINUTES FOR 3 DOSES AS NEEDEDFOR CHEST PAIN.  Marland Kitchen omega-3 acid ethyl esters (LOVAZA) 1 G capsule Take 2 g by mouth 2 (two) times daily.    . vitamin B-12 (CYANOCOBALAMIN) 1000 MCG tablet Take 1,000 mcg by mouth daily.      Allergies:   Ciprofloxacin; Penicillins; Amlodipine; Morphine and related; Cefaclor; Codeine; Lipitor [atorvastatin]; Prednisone; and Sulfa antibiotics   Social History   Tobacco Use  . Smoking status: Current Every Day Smoker    Packs/day: 1.00    Years: 56.00    Pack years: 56.00    Types: Cigarettes  . Smokeless tobacco: Never Used  Substance Use Topics  . Alcohol use: Yes    Alcohol/week: 1.0 standard drinks    Types: 1 Cans of beer per week  . Drug use: No     Current Outpatient Medications on File Prior to Visit  Medication Sig Dispense Refill  . acetaminophen (TYLENOL) 500 MG tablet Take 500-1,000 mg by mouth 2 (two) times daily as needed for headache (pain). Lunch and bedtime    . albuterol (PROVENTIL HFA;VENTOLIN HFA) 108 (90 Base) MCG/ACT inhaler Inhale 2 puffs into the lungs every 4 (four)  hours as needed for wheezing or shortness of breath.    Marland Kitchen albuterol (PROVENTIL) (2.5 MG/3ML) 0.083% nebulizer solution Take 2.5 mg by nebulization every 6 (six) hours as needed for shortness of breath.     . Ascorbic Acid (VITAMIN C) 1000 MG tablet Take 1,000 mg by mouth daily.    . bisacodyl (DULCOLAX) 10 MG suppository Place 1 suppository (10 mg total) rectally daily as needed  for moderate constipation. 12 suppository 0  . calcitRIOL (ROCALTROL) 0.5 MCG capsule Take 0.5 mcg by mouth every other day.     . cetirizine (ZYRTEC) 10 MG tablet Take 10 mg by mouth daily.     . clonazePAM (KLONOPIN) 1 MG tablet Take 2-3 mg by mouth at bedtime.     . diphenhydrAMINE (BENADRYL) 25 mg capsule Take 1 capsule (25 mg total) by mouth every 4 (four) hours as needed for itching. 30 capsule 0  . Fluticasone-Salmeterol (ADVAIR) 500-50 MCG/DOSE AEPB Inhale 1 puff into the lungs 2 (two) times daily.    . furosemide (LASIX) 40 MG tablet Take 40 mg by mouth daily.    . isosorbide mononitrate (IMDUR) 60 MG 24 hr tablet TAKE ONE TABLET (60MG  TOTAL) BY MOUTH TWO TIMES DAILY 180 tablet 2  . levothyroxine (SYNTHROID, LEVOTHROID) 112 MCG tablet Take 100 mcg by mouth daily before breakfast.     . nitroGLYCERIN (NITROLINGUAL) 0.4 MG/SPRAY spray PLACE ONE SPRAY UNDER THE TONGUE EVERY 5MINUTES FOR 3 DOSES AS NEEDEDFOR CHEST PAIN. 12 g 3  . omega-3 acid ethyl esters (LOVAZA) 1 G capsule Take 2 g by mouth 2 (two) times daily.      . vitamin B-12 (CYANOCOBALAMIN) 1000 MCG tablet Take 1,000 mcg by mouth daily.      No current facility-administered medications on file prior to visit.      Family Hx: The patient's family history includes Diabetes in her mother; Heart attack in her father; Heart disease in her brother and sister; Heart failure in her mother; Hypertension in her sister; Pancreatic cancer in her sister; Stroke in her mother. There is no history of Colon cancer.  ROS:   Please see the history of present illness.     Review of Systems  Constitutional: Negative.   HENT: Negative.   Respiratory: Positive for cough, shortness of breath and wheezing.   Cardiovascular: Negative.   Gastrointestinal: Negative.   Musculoskeletal: Negative.   Neurological: Negative.   Psychiatric/Behavioral: Negative.   All other systems reviewed and are negative.     Labs/Other Tests and Data Reviewed:    Recent Labs: 12/18/2017: TSH 8.987 01/02/2018: ALT 22 06/28/2018: BUN 18; Creatinine, Ser 1.61; Potassium 3.6; Sodium 140 07/01/2018: Platelets 256 11/06/2018: Hemoglobin 11.9   Recent Lipid Panel No results found for: CHOL, TRIG, HDL, CHOLHDL, LDLCALC, LDLDIRECT  Wt Readings from Last 3 Encounters:  11/16/18 170 lb (77.1 kg)  06/26/18 173 lb 15.1 oz (78.9 kg)  06/13/18 174 lb (78.9 kg)     Exam:    Vital Signs: Vital signs may also be detailed in the HPI BP (!) 145/80   Pulse (!) 53   Ht 5\' 7"  (1.702 m)   Wt 170 lb (77.1 kg)   SpO2 99%   BMI 26.63 kg/m   Wt Readings from Last 3 Encounters:  11/16/18 170 lb (77.1 kg)  06/26/18 173 lb 15.1 oz (78.9 kg)  06/13/18 174 lb (78.9 kg)   Temp Readings from Last 3 Encounters:  07/04/18 98.5 F (36.9 C) (Oral)  06/13/18 (!) 97.2 F (36.2 C) (Tympanic)  06/01/18 98.5 F (36.9 C) (Oral)   BP Readings from Last 3 Encounters:  11/16/18 (!) 145/80  08/22/18 (!) 198/103  07/04/18 (!) 121/49   Pulse Readings from Last 3 Encounters:  11/16/18 (!) 53  08/22/18 99  07/04/18 68     Well nourished, well developed female in no acute distress. Constitutional:  oriented to person, place, and time. No distress.  Audible wheezing and coughing appreciated on the phone  ASSESSMENT & PLAN:    PAD (peripheral artery disease) ([place]) Previously declined a statin Continues to smoke  Coronary artery disease of native artery of native heart with stable angina pectoris ([place]) Currently with no symptoms of angina. No further workup at this time. Continue current  medication regimen.  Centrilobular emphysema (HCC) Symptoms concerning for COPD exacerbation with bronchitis Long discussion with her, she reports Z-Pak typically works but often will need a second course Z-Pak has been called in She is high risk of worsening symptoms given severe COPD Recommend she call our office if symptoms get worse.  Also recommend she continue nebulizer treatments She continues to smoke  Essential hypertension Blood pressure is well controlled on today's visit. No changes made to the medications.  Chronic renal impairment, stage 3 (moderate) (HCC) Renal function stable, on Lasix 40 mg daily  Mixed hyperlipidemia Previously declined a statin  Paroxysmal atrial fibrillation (HCC) She reports that anticoagulation previously held by nephrology Review of previous EKGs through 2019 showing normal sinus rhythm We will discuss with her again in follow-up She has had severe ecchymotic bruising, for now we will not restart any anticoagulation   COVID-19 Education: The signs and symptoms of COVID-19 were discussed with the patient and how to seek care for testing (follow up with PCP or arrange E-visit).  The importance of social distancing was discussed today.  Patient Risk:   After full review of this patients clinical status, I feel that they are at least moderate risk at this time.  Time:   Today, I have spent 25 minutes with the patient with telehealth technology discussing the cardiac and medical problems/diagnoses detailed above   10 min spent reviewing the chart prior to patient visit today   Medication Adjustments/Labs and Tests Ordered: Current medicines are reviewed at length with the patient today.  Concerns regarding medicines are outlined above.   Tests Ordered: No tests ordered   Medication Changes: No changes made   Disposition: Follow-up in 6 months   Signed, Ida Rogue, MD  11/16/2018 4:06 PM    [place] Office [address], [place], [place] [address]

## 2018-11-20 ENCOUNTER — Telehealth: Payer: Medicare Other | Admitting: Cardiovascular Disease

## 2018-11-27 ENCOUNTER — Ambulatory Visit: Admission: RE | Admit: 2018-11-27 | Payer: Medicare Other | Source: Ambulatory Visit

## 2018-11-29 ENCOUNTER — Other Ambulatory Visit: Payer: Self-pay

## 2018-11-29 ENCOUNTER — Ambulatory Visit
Admission: RE | Admit: 2018-11-29 | Discharge: 2018-11-29 | Disposition: A | Discharge status: Home / Self Care | Payer: Medicare Other | Source: Ambulatory Visit | Attending: Family Medicine | Admitting: Family Medicine

## 2018-11-29 DIAGNOSIS — D631 Anemia in chronic kidney disease: Secondary | ICD-10-CM | POA: Diagnosis not present

## 2018-11-29 DIAGNOSIS — N184 Chronic kidney disease, stage 4 (severe): Secondary | ICD-10-CM | POA: Diagnosis not present

## 2018-11-29 LAB — HEMOGLOBIN: Hemoglobin: 11.4 g/dL — ABNORMAL LOW (ref 12.0–15.0)

## 2018-11-29 LAB — IRON AND TIBC
Iron: 88 ug/dL (ref 28–170)
Saturation Ratios: 27 % (ref 10.4–31.8)
TIBC: 325 ug/dL (ref 250–450)
UIBC: 237 ug/dL

## 2018-11-29 LAB — FERRITIN: Ferritin: 247 ng/mL (ref 11–307)

## 2018-11-29 LAB — TRANSFERRIN: Transferrin: 225 mg/dL (ref 192–382)

## 2018-11-29 MED ORDER — EPOETIN ALFA-EPBX 40000 UNIT/ML IJ SOLN
30000.0000 [IU] | Freq: Once | INTRAMUSCULAR | Status: AC
  Filled 2018-11-29: qty 1

## 2018-11-29 MED ORDER — EPOETIN ALFA-EPBX 10000 UNIT/ML IJ SOLN
INTRAMUSCULAR | Status: AC
  Administered 2018-11-29: 30000 [IU]
  Filled 2018-11-29: qty 3

## 2018-11-30 ENCOUNTER — Ambulatory Visit: Payer: Medicare Other | Admitting: Cardiovascular Disease

## 2018-11-30 ENCOUNTER — Telehealth: Payer: Self-pay

## 2018-11-30 NOTE — Telephone Encounter (Signed)
Left a message informing patient that the appointment she has today with Dr. Fletcher Anon has to be canceled and rescheduled for after she has her testing performed. Requested for patient to call back to schedule testing.

## 2018-12-18 ENCOUNTER — Ambulatory Visit
Admission: RE | Admit: 2018-12-18 | Discharge: 2018-12-18 | Disposition: A | Discharge status: Home / Self Care | Payer: Medicare Other | Source: Ambulatory Visit | Attending: Nephrology | Admitting: Nephrology

## 2018-12-18 ENCOUNTER — Other Ambulatory Visit: Payer: Self-pay

## 2018-12-18 DIAGNOSIS — N184 Chronic kidney disease, stage 4 (severe): Secondary | ICD-10-CM | POA: Insufficient documentation

## 2018-12-18 DIAGNOSIS — D631 Anemia in chronic kidney disease: Secondary | ICD-10-CM | POA: Insufficient documentation

## 2018-12-18 LAB — FERRITIN: Ferritin: 243 ng/mL (ref 11–307)

## 2018-12-18 LAB — IRON AND TIBC
Iron: 76 ug/dL (ref 28–170)
Saturation Ratios: 24 % (ref 10.4–31.8)
TIBC: 315 ug/dL (ref 250–450)
UIBC: 239 ug/dL

## 2018-12-18 LAB — HEMOGLOBIN: Hemoglobin: 11.7 g/dL — ABNORMAL LOW (ref 12.0–15.0)

## 2018-12-18 LAB — TRANSFERRIN: Transferrin: 237 mg/dL (ref 192–382)

## 2018-12-18 MED ORDER — EPOETIN ALFA-EPBX 40000 UNIT/ML IJ SOLN
30000.0000 [IU] | Freq: Once | INTRAMUSCULAR | Status: DC
  Filled 2018-12-18: qty 1

## 2018-12-18 MED ORDER — EPOETIN ALFA-EPBX 10000 UNIT/ML IJ SOLN
INTRAMUSCULAR | Status: AC
  Administered 2018-12-18: 30000 [IU]
  Filled 2018-12-18: qty 3

## 2018-12-29 ENCOUNTER — Telehealth: Payer: Self-pay | Admitting: Cardiovascular Disease

## 2018-12-29 NOTE — Telephone Encounter (Signed)

## 2019-01-08 ENCOUNTER — Ambulatory Visit
Admission: RE | Admit: 2019-01-08 | Discharge: 2019-01-08 | Disposition: A | Discharge status: Home / Self Care | Payer: Medicare Other | Source: Ambulatory Visit | Attending: Nephrology | Admitting: Nephrology

## 2019-01-08 ENCOUNTER — Other Ambulatory Visit: Payer: Self-pay

## 2019-01-08 DIAGNOSIS — D631 Anemia in chronic kidney disease: Secondary | ICD-10-CM | POA: Diagnosis not present

## 2019-01-08 DIAGNOSIS — N184 Chronic kidney disease, stage 4 (severe): Secondary | ICD-10-CM | POA: Insufficient documentation

## 2019-01-08 LAB — IRON AND TIBC
Iron: 99 ug/dL (ref 28–170)
Saturation Ratios: 31 % (ref 10.4–31.8)
TIBC: 320 ug/dL (ref 250–450)
UIBC: 221 ug/dL

## 2019-01-08 LAB — FERRITIN: Ferritin: 278 ng/mL (ref 11–307)

## 2019-01-08 LAB — HEMOGLOBIN: Hemoglobin: 13.5 g/dL (ref 12.0–15.0)

## 2019-01-08 LAB — TRANSFERRIN: Transferrin: 226 mg/dL (ref 192–382)

## 2019-01-10 ENCOUNTER — Other Ambulatory Visit: Payer: Self-pay

## 2019-01-10 ENCOUNTER — Ambulatory Visit
Admission: RE | Admit: 2019-01-10 | Discharge: 2019-01-10 | Disposition: A | Discharge status: Home / Self Care | Payer: Medicare Other | Source: Ambulatory Visit | Attending: Nephrology | Admitting: Nephrology

## 2019-01-10 DIAGNOSIS — D631 Anemia in chronic kidney disease: Secondary | ICD-10-CM | POA: Insufficient documentation

## 2019-01-10 DIAGNOSIS — N189 Chronic kidney disease, unspecified: Secondary | ICD-10-CM | POA: Diagnosis not present

## 2019-01-10 MED ORDER — SODIUM CHLORIDE 0.9 % IV SOLN
510.0000 mg | Freq: Once | INTRAVENOUS | Status: AC
  Administered 2019-01-10: 510 mg via INTRAVENOUS
  Filled 2019-01-10: qty 17

## 2019-01-11 ENCOUNTER — Ambulatory Visit: Payer: Medicare Other

## 2019-01-11 ENCOUNTER — Ambulatory Visit (HOSPITAL_COMMUNITY)
Admission: RE | Admit: 2019-01-11 | Discharge: 2019-01-11 | Disposition: A | Discharge status: Home / Self Care | Payer: Medicare Other | Source: Ambulatory Visit | Attending: Family Medicine | Admitting: Family Medicine

## 2019-01-11 ENCOUNTER — Other Ambulatory Visit (HOSPITAL_COMMUNITY): Payer: Self-pay | Admitting: Family Medicine

## 2019-01-11 DIAGNOSIS — M545 Low back pain, unspecified: Secondary | ICD-10-CM

## 2019-01-17 ENCOUNTER — Other Ambulatory Visit (HOSPITAL_COMMUNITY): Payer: Self-pay | Admitting: Family Medicine

## 2019-01-17 ENCOUNTER — Other Ambulatory Visit: Payer: Self-pay | Admitting: Family Medicine

## 2019-01-17 DIAGNOSIS — M545 Low back pain, unspecified: Secondary | ICD-10-CM

## 2019-01-24 ENCOUNTER — Ambulatory Visit (HOSPITAL_COMMUNITY): Payer: Medicare Other

## 2019-01-29 ENCOUNTER — Ambulatory Visit (HOSPITAL_COMMUNITY): Payer: Medicare Other

## 2019-01-29 ENCOUNTER — Ambulatory Visit
Admission: RE | Admit: 2019-01-29 | Discharge: 2019-01-29 | Disposition: A | Discharge status: Home / Self Care | Payer: Medicare Other | Source: Ambulatory Visit | Attending: Nephrology | Admitting: Nephrology

## 2019-01-29 ENCOUNTER — Other Ambulatory Visit: Payer: Self-pay

## 2019-01-29 DIAGNOSIS — N184 Chronic kidney disease, stage 4 (severe): Secondary | ICD-10-CM | POA: Diagnosis present

## 2019-01-29 DIAGNOSIS — D631 Anemia in chronic kidney disease: Secondary | ICD-10-CM | POA: Insufficient documentation

## 2019-01-29 LAB — HEMOGLOBIN: Hemoglobin: 13 g/dL (ref 12.0–15.0)

## 2019-01-29 LAB — IRON AND TIBC
Iron: 102 ug/dL (ref 28–170)
Saturation Ratios: 38 % — ABNORMAL HIGH (ref 10.4–31.8)
TIBC: 270 ug/dL (ref 250–450)
UIBC: 168 ug/dL

## 2019-01-29 LAB — FERRITIN: Ferritin: 559 ng/mL — ABNORMAL HIGH (ref 11–307)

## 2019-01-29 NOTE — OR Nursing (Signed)
Patient arrived ambulatory using her walker.  Blood work obtained and her hematocrit was 13.0, therefore she does not qualify for a retacrit injection today.  Appointment scheduled for 2 weeks from today to recheck blood work.  Labs faxed to dr. Jimmy Footman

## 2019-01-30 ENCOUNTER — Ambulatory Visit (HOSPITAL_COMMUNITY)
Admission: RE | Admit: 2019-01-30 | Discharge: 2019-01-30 | Disposition: A | Discharge status: Home / Self Care | Payer: Medicare Other | Source: Ambulatory Visit | Attending: Family Medicine | Admitting: Family Medicine

## 2019-01-30 DIAGNOSIS — M545 Low back pain, unspecified: Secondary | ICD-10-CM

## 2019-01-30 LAB — POCT I-STAT CREATININE: Creatinine, Ser: 0.8 mg/dL (ref 0.44–1.00)

## 2019-02-12 ENCOUNTER — Ambulatory Visit
Admission: RE | Admit: 2019-02-12 | Discharge: 2019-02-12 | Disposition: A | Discharge status: Home / Self Care | Payer: Medicare Other | Source: Ambulatory Visit | Attending: Nephrology | Admitting: Nephrology

## 2019-02-12 ENCOUNTER — Other Ambulatory Visit: Payer: Self-pay

## 2019-02-12 DIAGNOSIS — N184 Chronic kidney disease, stage 4 (severe): Secondary | ICD-10-CM | POA: Insufficient documentation

## 2019-02-12 DIAGNOSIS — D631 Anemia in chronic kidney disease: Secondary | ICD-10-CM | POA: Insufficient documentation

## 2019-02-12 LAB — IRON AND TIBC
Iron: 85 ug/dL (ref 28–170)
Saturation Ratios: 36 % — ABNORMAL HIGH (ref 10.4–31.8)
TIBC: 235 ug/dL — ABNORMAL LOW (ref 250–450)
UIBC: 150 ug/dL

## 2019-02-12 LAB — HEMOGLOBIN: Hemoglobin: 12.1 g/dL (ref 12.0–15.0)

## 2019-02-12 LAB — FERRITIN: Ferritin: 450 ng/mL — ABNORMAL HIGH (ref 11–307)

## 2019-02-12 NOTE — Progress Notes (Signed)
Cardiology Office Note  Date:  02/13/2019   ID:  Kim HAMIL, DOB [birth year], MRN [id number]  PCP:  Kim Evens, MD   Chief Complaint  Patient presents with  . Other    Patient c/o SOB and bilateral Leg pain. Meds reviewed verbally with patient.     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 Jimmy Footman in [place]  reports that she is DNR/DNI, she has told this to family, has papers in place who presents for routine followup of her coronary artery disease And leg swelling.  Reports having severe low back pain Prior falls, feels this exacerbated her discomfort MRI reviewed with her showing moderate to severe spinal stenosis, severe foraminal stenosis bilaterally, vertebral compression fractures Does not like to take pain medications, side effects  Recent lab work reviewed , CR <1, well below her baseline typically creatinine 1.6 Anemia resolved, receiving iron Edema resolved with diuretics and improved anemia  Chronic bronchitis stable  Feels her breathing is stable Taking Lasix 40 daily  BP at home: 128-130s Elevated in office today, back is bothering her  Chronic  Knee and shoulder surgery  Denies any tachycardia concerning for arrhythmia, atrial fibrillation  EKG personally reviewed by myself on todays visit Shows normal sinus rhythm with short runs PVCs/APCs rate 88 bpm  Other past medical history reviewed Off plavix and asa, pradaxa : "[facility] stopped it" creatinine 2.36 in July 2019 CR 1.6 in dec 2019   fall late 2019 Xray:05/19/2018  Acute sacral fracture with  presacral edema. . Stable old mild T12 and L1 compression fractures. Stable grade 1 L1-2 RIGHT retrolisthesis and grade 1 L4-5 anterolisthesis without spondylolysis. 3. 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 Moderate canal stenosis L2-3. 4. Neural foraminal narrowing L2-3 through L5-S1: Moderate to severe L3-4 and L4-5.  Echocardiogram ejection fraction 65-70 percent  Previous workup for lower extremity vascular disease   showing moderate nonobstructive disease, medical management recommended    PMH:   has a past medical history of Anemia, CAD (coronary artery disease), Carotid artery disease ([place]), Cervical disc disease, CKD (chronic kidney disease), stage III ([place]), Closed fracture of multiple pubic rami ([place]) (06/24/2018), COPD (chronic obstructive pulmonary disease) ([place]), Diastolic dysfunction, Dysphagia, GERD (gastroesophageal reflux disease), History of kidney stones, Hypertension, Hypertriglyceridemia, Hypothyroidism, Myocardial infarction ([place]) (2003), PAD (peripheral artery disease) ([place]), PAF (paroxysmal atrial fibrillation) ([place]), PONV (postoperative nausea and vomiting), and Right renal artery stenosis ([place]).  PSH:    Past Surgical History:  Procedure Laterality Date  . ABDOMINAL HYSTERECTOMY    . AMPUTATION Right 04/12/2014   Procedure: Revision AMPUTATION Right Long DIGIT;  Surgeon: Leanora Cover, MD;  Location: [facility];  Service: Orthopedics;  Laterality: Right;  . APPLICATION OF WOUND VAC Left 01/06/2018   Procedure: APPLICATION OF WOUND VAC;  Surgeon: Samara Deist, DPM;  Location: [facility];  Service: Podiatry;  Laterality: Left;  . BALLOON DILATION  05/05/2011   Procedure: BALLOON DILATION;  Surgeon: Rogene Houston, MD;  [facility];  Service: Endoscopy;  Laterality: N/A;  . CARDIAC CATHETERIZATION  with stent 2004  CYPHER 2.75 x 77mm coronary stent in the mid left anterior descending stenotic lesion    Last  stress test showed EF of 775  . CARDIOVERSION    . CATARACT EXTRACTION Left 2002  . CATARACT EXTRACTION W/PHACO Right 05/14/2013   Procedure: CATARACT EXTRACTION PHACO AND INTRAOCULAR LENS PLACEMENT RIGHT EYE;  Surgeon: Tonny Branch, MD;  Location: [facility];  Service: Ophthalmology;  Laterality: Right;  CDE:  20.45  . CERVICAL DISC SURGERY    . CHOLECYSTECTOMY    . CORONARY ANGIOPLASTY     1 stent  . ESOPHAGEAL DILATION N/A 04/25/2015   Procedure: ESOPHAGEAL DILATION;  Surgeon: Rogene Houston, MD;  [facility];  Service: Endoscopy;  Laterality: N/A;  . ESOPHAGOGASTRODUODENOSCOPY N/A 04/25/2015   Procedure: ESOPHAGOGASTRODUODENOSCOPY (EGD);  Surgeon: Rogene Houston, MD;  [facility];  Service: Endoscopy;  Laterality: N/A;  1225  . IR SACROPLASTY BILATERAL  06/13/2018  . IRRIGATION AND DEBRIDEMENT FOOT Left 01/06/2018   Procedure: IRRIGATION AND DEBRIDEMENT FOOT;  Surgeon: Samara Deist, DPM;  Location: [facility];  Service: Podiatry;  Laterality: Left;  Marland Kitchen MALONEY DILATION  05/05/2011   Procedure: MALONEY DILATION;  Surgeon: Rogene Houston, MD;  [facility];  Service: Endoscopy;  Laterality: N/A;  . right knee arthroscopy  2010  . SAVORY DILATION  05/05/2011   Procedure: SAVORY DILATION;  Surgeon: Rogene Houston, MD;  [facility];  Service: Endoscopy;  Laterality: N/A;  . SHOULDER SURGERY Right    torn rotator cuff  . TOENAIL EXCISION    . TOTAL ABDOMINAL HYSTERECTOMY W/ BILATERAL SALPINGOOPHORECTOMY      Current Outpatient Medications  Medication Sig Dispense Refill  . acetaminophen (TYLENOL) 500 MG tablet Take 500-1,000 mg by mouth 2 (two) times daily as needed for headache (pain). Lunch and bedtime    . albuterol (PROVENTIL HFA;VENTOLIN HFA) 108 (90 Base) MCG/ACT inhaler Inhale 2 puffs into the lungs every 4 (four) hours as needed for wheezing or shortness of breath.    Marland Kitchen albuterol (PROVENTIL) (2.5 MG/3ML) 0.083% nebulizer solution Take 2.5  mg by nebulization every 6 (six) hours as needed for shortness of breath.     . Ascorbic Acid (VITAMIN C) 1000 MG tablet Take 1,000 mg by mouth daily.    . bisacodyl (DULCOLAX) 10 MG suppository Place 1 suppository (10 mg total) rectally daily as needed for moderate constipation. 12 suppository 0  . calcitRIOL (ROCALTROL) 0.5 MCG capsule Take 0.5 mcg by mouth every other day.     . cetirizine (ZYRTEC) 10 MG tablet Take 10 mg by mouth daily.     . clonazePAM (KLONOPIN) 1 MG tablet Take 2-3 mg by mouth at bedtime.     . diphenhydrAMINE (BENADRYL) 25 mg capsule Take 1 capsule (25 mg total) by mouth every 4 (four) hours as needed for itching. 30 capsule 0  . Fluticasone-Salmeterol (ADVAIR) 500-50 MCG/DOSE AEPB Inhale 1 puff into the lungs 2 (two) times daily.    . furosemide (LASIX) 40 MG tablet Take 40 mg by mouth daily.    . isosorbide mononitrate (IMDUR) 60 MG 24 hr tablet TAKE ONE TABLET (60MG  TOTAL) BY MOUTH TWO TIMES DAILY 180 tablet 2  . levothyroxine (SYNTHROID, LEVOTHROID) 112 MCG tablet Take 100 mcg by mouth daily before breakfast.     . nitroGLYCERIN (NITROLINGUAL) 0.4 MG/SPRAY spray PLACE ONE SPRAY UNDER THE TONGUE EVERY 5MINUTES FOR 3 DOSES AS NEEDEDFOR CHEST PAIN. 12 g 3  .  omega-3 acid ethyl esters (LOVAZA) 1 G capsule Take 2 g by mouth 2 (two) times daily.      . vitamin B-12 (CYANOCOBALAMIN) 1000 MCG tablet Take 1,000 mcg by mouth daily.      No current facility-administered medications for this visit.      Allergies:   Ciprofloxacin, Penicillins, Amlodipine, Morphine and related, Cefaclor, Codeine, Lipitor [atorvastatin], Prednisone, and Sulfa antibiotics   Social History:  The patient  reports that she has been smoking cigarettes. She has a 56.00 pack-year smoking history. She has never used smokeless tobacco. She reports current alcohol use of about 1.0 standard drinks of alcohol per week. She reports that she does not use drugs.   Family History:   family history includes  Diabetes in her mother; Heart attack in her father; Heart disease in her brother and sister; Heart failure in her mother; Hypertension in her sister; Pancreatic cancer in her sister; Stroke in her mother.    Review of Systems: Review of Systems  Constitutional: Negative.   HENT: Negative.   Respiratory: Positive for cough and shortness of breath.   Cardiovascular: Negative.   Gastrointestinal: Negative.   Musculoskeletal: Positive for back pain.       Leg pain  Neurological: Negative.   Psychiatric/Behavioral: Negative.   All other systems reviewed and are negative.   PHYSICAL EXAM: VS:  BP (!) 160/60 (BP Location: Right Arm, Patient Position: Sitting, Cuff Size: Normal)   Pulse 88   Ht 5\' 7"  (1.702 m)   Wt 181 lb 12 oz (82.4 kg)   BMI 28.47 kg/m  , BMI Body mass index is 28.47 kg/m. Constitutional:  oriented to person, place, and time. No distress.  Presents in a wheelchair HENT:  Head: Grossly normal Eyes:  no discharge. No scleral icterus.  Neck: No JVD, no carotid bruits  Cardiovascular: Regular rate and rhythm, no murmurs appreciated Pulmonary/Chest: Clear to auscultation bilaterally, no wheezes or rails Abdominal: Soft.  no distension.  no tenderness.  Musculoskeletal: Normal range of motion Neurological:  normal muscle tone. Coordination normal. No atrophy Skin: Skin warm and dry Psychiatric: normal affect, pleasant   Recent Labs: 06/28/2018: BUN 18; Potassium 3.6; Sodium 140 07/01/2018: Platelets 256 01/30/2019: Creatinine, Ser 0.80 02/12/2019: Hemoglobin 12.1   Lipid Panel No results found for: CHOL, HDL, LDLCALC, TRIG    Wt Readings from Last 3 Encounters:  02/13/19 181 lb 12 oz (82.4 kg)  11/16/18 170 lb (77.1 kg)  06/26/18 173 lb 15.1 oz (78.9 kg)      ASSESSMENT AND PLAN:  Paroxysmal atrial fibrillation (HCC) - Plan: EKG 12-Lead Maintaining normal sinus rhythm, Anticoagulation previously held given anemia and high fall risk Discussed with her  again today  Essential hypertension - Plan: EKG 12-Lead Blood pressure elevated but likely secondary to severe pain No medication changes made  Chronic back pain Referral made to neurosurgery in [facility] Spinal stenosis, foraminal stenosis, vertebral fractures Referral discussed with her.  Reports that she is unable to tolerate pain medication, having a difficult time getting around secondary to progressive symptoms  Coronary artery disease involving native coronary artery of native heart without angina pectoris -  Currently with no symptoms of angina. No further workup at this time. Continue current medication regimen.  Stable  Acute on chronic renal failure Stage IV Followed in [facility] Typically creatinine baseline 1.6  Lower extremity edema Edema has resolved on today's visit, No changes to medications  Mixed hyperlipidemia She does not want a statin,   on  Zetia  PAD Lower extremity arterial stenosis, 50-75% stenosis mid right SFA She is very sedentary at baseline, not a very far  SOB (shortness of breath)  chronic bronchitis.  COPD Uses nebulizers  Leg pain /back pain Chronic issue, reports symptoms are getting worse Referral made as above   Total encounter time more than 25 minutes  Greater than 50% was spent in counseling and coordination of care with the patient     No orders of the defined types were placed in this encounter.    Signed, Esmond Plants, M.D., Ph.D. 02/13/2019  [place], [place]

## 2019-02-13 ENCOUNTER — Ambulatory Visit (INDEPENDENT_AMBULATORY_CARE_PROVIDER_SITE_OTHER): Payer: Medicare Other | Admitting: Cardiovascular Disease

## 2019-02-13 ENCOUNTER — Encounter: Payer: Self-pay | Admitting: Cardiovascular Disease

## 2019-02-13 VITALS — BP 160/60 | HR 88 | Ht 67.0 in | Wt 181.8 lb

## 2019-02-13 DIAGNOSIS — I25118 Atherosclerotic heart disease of native coronary artery with other forms of angina pectoris: Secondary | ICD-10-CM

## 2019-02-13 DIAGNOSIS — I10 Essential (primary) hypertension: Secondary | ICD-10-CM | POA: Diagnosis not present

## 2019-02-13 DIAGNOSIS — E782 Mixed hyperlipidemia: Secondary | ICD-10-CM

## 2019-02-13 DIAGNOSIS — I480 Paroxysmal atrial fibrillation: Secondary | ICD-10-CM

## 2019-02-13 DIAGNOSIS — N183 Chronic kidney disease, stage 3 unspecified: Secondary | ICD-10-CM

## 2019-02-13 DIAGNOSIS — I739 Peripheral vascular disease, unspecified: Secondary | ICD-10-CM | POA: Diagnosis not present

## 2019-02-13 DIAGNOSIS — R937 Abnormal findings on diagnostic imaging of other parts of musculoskeletal system: Secondary | ICD-10-CM

## 2019-02-13 DIAGNOSIS — R0602 Shortness of breath: Secondary | ICD-10-CM

## 2019-02-13 DIAGNOSIS — J432 Centrilobular emphysema: Secondary | ICD-10-CM | POA: Diagnosis not present

## 2019-02-13 MED ORDER — BISOPROLOL FUMARATE 5 MG PO TABS: 5.0000 mg | ORAL_TABLET | Freq: Every day | ORAL | 3 refills | Status: DC

## 2019-02-13 NOTE — Patient Instructions (Addendum)
Referral to Dr. Arnoldo Morale give back pain, Spinal stenosis [phone]  Medication Instructions:  Your physician has recommended you make the following change in your medication:  1. START Bisoprolol 5 mg once daily  If you need a refill on your cardiac medications before your next appointment, please call your pharmacy.    Lab work: No new labs needed   If you have labs (blood work) drawn today and your tests are completely normal, you will receive your results only by: Marland Kitchen MyChart Message (if you have MyChart) OR . A paper copy in the mail If you have any lab test that is abnormal or we need to change your treatment, we will call you to review the results.   Testing/Procedures: No new testing needed   Follow-Up: At [facility], you and your health needs are our priority.  As part of our continuing mission to provide you with exceptional heart care, we have created designated Provider Care Teams.  These Care Teams include your primary Cardiologist (physician) and Advanced Practice Providers (APPs -  Physician Assistants and Nurse Practitioners) who all work together to provide you with the care you need, when you need it.  . You will need a follow up appointment in 6 months .   Please call our office 2 months in advance to schedule this appointment.    . Providers on your designated Care Team:   . Murray Hodgkins, NP . Christell Faith, PA-C . Marrianne Mood, PA-C  Any Other Special Instructions Will Be Listed Below (If Applicable).  For educational health videos Log in to : [email or web address] Or : [email or web address], password : [email or web address]

## 2019-02-19 ENCOUNTER — Ambulatory Visit: Admission: RE | Admit: 2019-02-19 | Payer: Medicare Other | Source: Ambulatory Visit

## 2019-02-20 ENCOUNTER — Telehealth (HOSPITAL_COMMUNITY): Payer: Self-pay

## 2019-02-20 NOTE — Telephone Encounter (Signed)
-----   Message from Ronney Lion, [place] sent at 02/20/2019  4:06 PM EDT ----- Lia Foyer,  I had Dr. Estanislado Pandy review and he has approved an image-guided T12 KP/VP first available. No need to consult prior unless requested by patient. Please call and schedule and please let me know if you have any questions.  Thanks! Ally ----- Message ----- From: Danielle Dess Sent: 02/20/2019   2:55 PM EDT To: Ronney Lion, PA-C  Referral from Carlis Abbott, [place] for [place]. Please review. AW

## 2019-02-21 ENCOUNTER — Other Ambulatory Visit (HOSPITAL_COMMUNITY): Payer: Self-pay | Admitting: Interventional Radiology

## 2019-02-21 ENCOUNTER — Telehealth (HOSPITAL_COMMUNITY): Payer: Self-pay

## 2019-02-21 DIAGNOSIS — S22080A Wedge compression fracture of T11-T12 vertebra, initial encounter for closed fracture: Secondary | ICD-10-CM

## 2019-02-21 NOTE — Telephone Encounter (Signed)
Called to schedule T12 KP/VP, no answer, left vm. AW

## 2019-02-26 ENCOUNTER — Ambulatory Visit
Admission: RE | Admit: 2019-02-26 | Discharge: 2019-02-26 | Disposition: A | Discharge status: Home / Self Care | Payer: Medicare Other | Source: Ambulatory Visit | Attending: Nephrology | Admitting: Nephrology

## 2019-02-26 ENCOUNTER — Other Ambulatory Visit: Payer: Self-pay

## 2019-02-26 DIAGNOSIS — D631 Anemia in chronic kidney disease: Secondary | ICD-10-CM | POA: Diagnosis not present

## 2019-02-26 DIAGNOSIS — N184 Chronic kidney disease, stage 4 (severe): Secondary | ICD-10-CM | POA: Diagnosis present

## 2019-02-26 LAB — IRON AND TIBC
Iron: 115 ug/dL (ref 28–170)
Saturation Ratios: 44 % — ABNORMAL HIGH (ref 10.4–31.8)
TIBC: 261 ug/dL (ref 250–450)
UIBC: 146 ug/dL

## 2019-02-26 LAB — TRANSFERRIN: Transferrin: 193 mg/dL (ref 192–382)

## 2019-02-26 LAB — FERRITIN: Ferritin: 586 ng/mL — ABNORMAL HIGH (ref 11–307)

## 2019-02-26 LAB — HEMOGLOBIN: Hemoglobin: 12.7 g/dL (ref 12.0–15.0)

## 2019-03-06 ENCOUNTER — Telehealth (HOSPITAL_COMMUNITY): Payer: Self-pay

## 2019-03-06 NOTE — Telephone Encounter (Signed)
Called to schedule kp, no answer, left vm. AW 

## 2019-03-13 ENCOUNTER — Ambulatory Visit
Admission: RE | Admit: 2019-03-13 | Discharge: 2019-03-13 | Disposition: A | Discharge status: Home / Self Care | Payer: Medicare Other | Source: Ambulatory Visit | Attending: Nephrology | Admitting: Nephrology

## 2019-03-14 ENCOUNTER — Ambulatory Visit
Admission: RE | Admit: 2019-03-14 | Discharge: 2019-03-14 | Disposition: A | Discharge status: Home / Self Care | Payer: Medicare Other | Source: Ambulatory Visit | Attending: Nephrology | Admitting: Nephrology

## 2019-03-14 ENCOUNTER — Other Ambulatory Visit: Payer: Self-pay

## 2019-03-14 DIAGNOSIS — D631 Anemia in chronic kidney disease: Secondary | ICD-10-CM | POA: Insufficient documentation

## 2019-03-14 DIAGNOSIS — N184 Chronic kidney disease, stage 4 (severe): Secondary | ICD-10-CM | POA: Insufficient documentation

## 2019-03-14 LAB — HEMOGLOBIN: Hemoglobin: 12 g/dL (ref 12.0–15.0)

## 2019-03-26 ENCOUNTER — Ambulatory Visit
Admission: RE | Admit: 2019-03-26 | Discharge: 2019-03-26 | Disposition: A | Discharge status: Home / Self Care | Payer: Medicare Other | Source: Ambulatory Visit | Attending: Nephrology | Admitting: Nephrology

## 2019-03-26 ENCOUNTER — Other Ambulatory Visit: Payer: Self-pay

## 2019-03-26 DIAGNOSIS — D631 Anemia in chronic kidney disease: Secondary | ICD-10-CM | POA: Diagnosis not present

## 2019-03-26 DIAGNOSIS — N184 Chronic kidney disease, stage 4 (severe): Secondary | ICD-10-CM | POA: Insufficient documentation

## 2019-03-26 LAB — FERRITIN: Ferritin: 432 ng/mL — ABNORMAL HIGH (ref 11–307)

## 2019-03-26 LAB — IRON AND TIBC
Iron: 78 ug/dL (ref 28–170)
Saturation Ratios: 30 % (ref 10.4–31.8)
TIBC: 262 ug/dL (ref 250–450)
UIBC: 184 ug/dL

## 2019-03-26 LAB — HEMOGLOBIN: Hemoglobin: 11.6 g/dL — ABNORMAL LOW (ref 12.0–15.0)

## 2019-03-26 LAB — TRANSFERRIN: Transferrin: 183 mg/dL — ABNORMAL LOW (ref 192–382)

## 2019-03-26 MED ORDER — EPOETIN ALFA-EPBX 10000 UNIT/ML IJ SOLN
INTRAMUSCULAR | Status: AC
  Administered 2019-03-26: 11:00:00 30000 [IU]
  Filled 2019-03-26: qty 3

## 2019-03-26 MED ORDER — EPOETIN ALFA-EPBX 40000 UNIT/ML IJ SOLN
30000.0000 [IU] | Freq: Once | INTRAMUSCULAR | Status: DC
  Filled 2019-03-26: qty 1

## 2019-04-16 ENCOUNTER — Ambulatory Visit
Admission: RE | Admit: 2019-04-16 | Discharge: 2019-04-16 | Disposition: A | Discharge status: Home / Self Care | Payer: Medicare Other | Source: Ambulatory Visit | Attending: Nephrology | Admitting: Nephrology

## 2019-04-16 ENCOUNTER — Other Ambulatory Visit: Payer: Self-pay

## 2019-04-16 DIAGNOSIS — N184 Chronic kidney disease, stage 4 (severe): Secondary | ICD-10-CM | POA: Diagnosis not present

## 2019-04-16 DIAGNOSIS — D631 Anemia in chronic kidney disease: Secondary | ICD-10-CM | POA: Diagnosis not present

## 2019-04-16 LAB — IRON AND TIBC
Iron: 104 ug/dL (ref 28–170)
Saturation Ratios: 40 % — ABNORMAL HIGH (ref 10.4–31.8)
TIBC: 257 ug/dL (ref 250–450)
UIBC: 153 ug/dL

## 2019-04-16 LAB — HEMOGLOBIN: Hemoglobin: 12.7 g/dL (ref 12.0–15.0)

## 2019-04-16 LAB — FERRITIN: Ferritin: 492 ng/mL — ABNORMAL HIGH (ref 11–307)

## 2019-04-16 LAB — TRANSFERRIN: Transferrin: 188 mg/dL — ABNORMAL LOW (ref 192–382)

## 2019-04-30 ENCOUNTER — Other Ambulatory Visit: Payer: Self-pay

## 2019-04-30 ENCOUNTER — Ambulatory Visit
Admission: RE | Admit: 2019-04-30 | Discharge: 2019-04-30 | Disposition: A | Discharge status: Home / Self Care | Payer: Medicare Other | Source: Ambulatory Visit | Attending: Family Medicine | Admitting: Family Medicine

## 2019-04-30 ENCOUNTER — Ambulatory Visit: Payer: Medicare Other | Admitting: Podiatry

## 2019-04-30 DIAGNOSIS — D631 Anemia in chronic kidney disease: Secondary | ICD-10-CM | POA: Diagnosis not present

## 2019-04-30 DIAGNOSIS — M19071 Primary osteoarthritis, right ankle and foot: Secondary | ICD-10-CM

## 2019-04-30 DIAGNOSIS — M778 Other enthesopathies, not elsewhere classified: Secondary | ICD-10-CM

## 2019-04-30 DIAGNOSIS — N184 Chronic kidney disease, stage 4 (severe): Secondary | ICD-10-CM | POA: Insufficient documentation

## 2019-04-30 LAB — HEMOGLOBIN: Hemoglobin: 11.9 g/dL — ABNORMAL LOW (ref 12.0–15.0)

## 2019-04-30 MED ORDER — EPOETIN ALFA-EPBX 10000 UNIT/ML IJ SOLN
30000.0000 [IU] | Freq: Once | INTRAMUSCULAR | Status: AC
  Administered 2019-04-30: 12:00:00 30000 [IU] via SUBCUTANEOUS

## 2019-04-30 NOTE — Progress Notes (Signed)
She presents today for continued right foot pain have not seen her for almost a year now she states become worse over the past week or 2 as she refers to the of the first metatarsophalangeal joint.  She is also complaining of pain to the dorsal aspect of the right foot.  Objective: Vital signs are stable alert and oriented x3.  Pulses are palpable.  She has pain on palpation of the dorsal aspect of the tarsometatarsal joints with some nodularity consistent with osteoarthritis.  She also has pain on palpation and range of motion of the first metatarsophalangeal joint of the right foot previously diagnosed with capsulitis and hallux limitus.  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  Dorsal capsulitis and osteoarthritis right foot.  Plan: After sterile Betadine skin prep to both areas we injected the dorsal aspect of 20 mg Kenalog 5 mg Marcaine and injected the first metatarsophalangeal joint with 10 mg of Kenalog 5 mg Marcaine.  Tolerated procedures well I will follow-up with her on an as-needed basis.

## 2019-05-01 ENCOUNTER — Encounter: Payer: Self-pay | Admitting: Cardiovascular Disease

## 2019-05-01 ENCOUNTER — Telehealth: Payer: Self-pay | Admitting: Cardiovascular Disease

## 2019-05-01 NOTE — Telephone Encounter (Signed)
This encounter was created in error - please disregard.

## 2019-05-01 NOTE — Telephone Encounter (Signed)
° °  [facility] Pre-operative Risk Assessment    Request for surgical clearance:  1. What type of surgery is being performed? Kyphoplasty T12   2. When is this surgery scheduled? TBD  3. What type of clearance is required (medical clearance vs. Pharmacy clearance to hold med vs. Both)? both  4. Are there any medications that need to be held prior to surgery and how long? None listed, please advise if there are any medications to be held   5. Practice name and name of physician performing surgery? [facility], Dr Newman Pies   6. What is your office phone number [phone]     7.   What is your office fax number [phone], attn: [phone]   8.   Anesthesia type (None, local, MAC, general) ? General    [name] 05/01/2019, 4:09 PM  _________________________________________________________________   (provider comments below)

## 2019-05-02 ENCOUNTER — Ambulatory Visit (HOSPITAL_COMMUNITY)
Admission: RE | Admit: 2019-05-02 | Discharge: 2019-05-02 | Disposition: A | Discharge status: Home / Self Care | Payer: Medicare Other | Source: Ambulatory Visit | Attending: Family Medicine | Admitting: Family Medicine

## 2019-05-02 ENCOUNTER — Other Ambulatory Visit (HOSPITAL_COMMUNITY): Payer: Self-pay | Admitting: Family Medicine

## 2019-05-02 ENCOUNTER — Other Ambulatory Visit: Payer: Self-pay

## 2019-05-02 ENCOUNTER — Encounter (HOSPITAL_COMMUNITY): Payer: Self-pay

## 2019-05-02 DIAGNOSIS — W19XXXD Unspecified fall, subsequent encounter: Secondary | ICD-10-CM | POA: Insufficient documentation

## 2019-05-02 DIAGNOSIS — M19011 Primary osteoarthritis, right shoulder: Secondary | ICD-10-CM | POA: Insufficient documentation

## 2019-05-02 DIAGNOSIS — M1712 Unilateral primary osteoarthritis, left knee: Secondary | ICD-10-CM | POA: Diagnosis not present

## 2019-05-02 DIAGNOSIS — M25562 Pain in left knee: Secondary | ICD-10-CM | POA: Diagnosis present

## 2019-05-02 NOTE — Telephone Encounter (Signed)
Patient returning call  Transferred to [facility] to get in touch with preop APP of the day

## 2019-05-02 NOTE — Telephone Encounter (Signed)
Follow Up:      Pt is returning call to Pre-Op

## 2019-05-02 NOTE — Telephone Encounter (Signed)
Left voicemail for the patient to call back and speak to the on call preop APP of the day

## 2019-05-04 NOTE — Telephone Encounter (Signed)
Patient was contacted today for cardiac clearance. She has PMH of CAD, PAF not on anticoagulation, DNR/DNI, CKD and severe back pain. Last myoview was in 2014. She was last seen by Dr. Rockey Situ on 02/13/2019 at which time she was doing well.   According to the patient, she continue to be very stable from cardiac perspective. She denies any recent chest pain or shortness of breath. Her functional ability is limited by back pain. She cannot vacuum, but able to walk around for limited distance with walker.   I will check with Dr. Rockey Situ to make sure he is ok with clearing the patient given back of symptomatic change.

## 2019-05-06 NOTE — Telephone Encounter (Signed)
Okay to proceed No further testing

## 2019-05-08 NOTE — Telephone Encounter (Signed)
   Primary Cardiologist: Ida Rogue, MD  Chart reviewed as part of pre-operative protocol coverage. Given past medical history and time since last visit, based on ACC/AHA guidelines, [name] would be at acceptable risk for the planned procedure without further cardiovascular testing per Dr. Rockey Situ. Pt contacted by pre-op team and was doing well from a cardiac perspective with no angina or SOB. Functional ability was noted to be limited by back pain.   I will route this recommendation to the requesting party via Epic fax function and remove from pre-op pool.  Please call with questions.  [name], NP 05/08/2019, 8:20 AM

## 2019-05-10 ENCOUNTER — Ambulatory Visit (INDEPENDENT_AMBULATORY_CARE_PROVIDER_SITE_OTHER): Payer: Medicare Other | Admitting: Nurse Practitioner

## 2019-05-17 ENCOUNTER — Ambulatory Visit (INDEPENDENT_AMBULATORY_CARE_PROVIDER_SITE_OTHER): Payer: Medicare Other | Admitting: Nurse Practitioner

## 2019-05-21 ENCOUNTER — Ambulatory Visit: Admission: RE | Admit: 2019-05-21 | Payer: Medicare Other | Source: Ambulatory Visit

## 2019-05-22 ENCOUNTER — Ambulatory Visit
Admission: RE | Admit: 2019-05-22 | Discharge: 2019-05-22 | Disposition: A | Discharge status: Home / Self Care | Payer: Medicare Other | Source: Ambulatory Visit | Attending: Nephrology | Admitting: Nephrology

## 2019-05-22 ENCOUNTER — Other Ambulatory Visit: Payer: Self-pay

## 2019-05-22 DIAGNOSIS — D631 Anemia in chronic kidney disease: Secondary | ICD-10-CM | POA: Diagnosis not present

## 2019-05-22 DIAGNOSIS — N184 Chronic kidney disease, stage 4 (severe): Secondary | ICD-10-CM | POA: Insufficient documentation

## 2019-05-22 LAB — TRANSFERRIN: Transferrin: 208 mg/dL (ref 192–382)

## 2019-05-22 LAB — HEMOGLOBIN: Hemoglobin: 12.9 g/dL (ref 12.0–15.0)

## 2019-05-22 LAB — IRON AND TIBC
Iron: 123 ug/dL (ref 28–170)
Saturation Ratios: 42 % — ABNORMAL HIGH (ref 10.4–31.8)
TIBC: 291 ug/dL (ref 250–450)
UIBC: 168 ug/dL

## 2019-05-22 LAB — FERRITIN: Ferritin: 565 ng/mL — ABNORMAL HIGH (ref 11–307)

## 2019-05-22 MED ORDER — SODIUM CHLORIDE FLUSH 0.9 % IV SOLN
INTRAVENOUS | Status: AC
  Filled 2019-05-22: qty 10

## 2019-05-23 ENCOUNTER — Other Ambulatory Visit: Payer: Self-pay | Admitting: Cardiovascular Disease

## 2019-05-23 ENCOUNTER — Ambulatory Visit (INDEPENDENT_AMBULATORY_CARE_PROVIDER_SITE_OTHER): Payer: Medicare Other

## 2019-05-23 ENCOUNTER — Other Ambulatory Visit: Payer: Self-pay

## 2019-05-23 DIAGNOSIS — I739 Peripheral vascular disease, unspecified: Secondary | ICD-10-CM

## 2019-05-24 ENCOUNTER — Telehealth: Payer: Self-pay

## 2019-05-24 NOTE — Telephone Encounter (Signed)
DPR on file with ok to leave a detailed message on the patient's voicemail. lmom with LE study results. Patient is to contact the office if any questions.

## 2019-05-24 NOTE — Telephone Encounter (Signed)
-----   Message from Wellington Hampshire, MD sent at 05/24/2019  7:29 AM EST ----- Inform patient that lower extremity arterial Doppler was close to normal with no significant iliac disease.  This is good news.

## 2019-06-04 ENCOUNTER — Ambulatory Visit: Payer: Medicare Other

## 2019-06-04 ENCOUNTER — Telehealth: Payer: Self-pay | Admitting: Cardiovascular Disease

## 2019-06-04 NOTE — Telephone Encounter (Signed)
Pt c/o medication issue:  1. Name of Medication: bisoprolol (ZEBETA)   2. How are you currently taking this medication (dosage and times per day)? 5 MG 1 tablet daily   3. Are you having a reaction (difficulty breathing--STAT)? Leg swelling  4. What is your medication issue? Patient calling, since starting this medication patient has been dealing with severe swelling in feet and legs.  Would like to know if this is a side effect of medication or possibly another issue.  Please call to discuss.

## 2019-06-04 NOTE — Telephone Encounter (Signed)
Spoke with patient and she states that since starting the bisoprolol she developed severe swelling. Confirmed if she is wearing compression socks and elevating those legs which she reports that she has been keeping them elevated. She states that after noticing the swelling she has since only been taking off and on. While speaking with her on the phone you can hear her audible wheezing. She would like recommendations on what she should do. Advised that I would send this message over to Dr. Rockey Situ for review and recommendations and would then be in touch. She verbalized understanding with no further questions at this time.

## 2019-06-05 ENCOUNTER — Ambulatory Visit
Admission: RE | Admit: 2019-06-05 | Discharge: 2019-06-05 | Disposition: A | Discharge status: Home / Self Care | Payer: Medicare Other | Source: Ambulatory Visit | Attending: Nephrology | Admitting: Nephrology

## 2019-06-05 DIAGNOSIS — N184 Chronic kidney disease, stage 4 (severe): Secondary | ICD-10-CM | POA: Insufficient documentation

## 2019-06-05 DIAGNOSIS — D631 Anemia in chronic kidney disease: Secondary | ICD-10-CM | POA: Diagnosis not present

## 2019-06-05 LAB — HEMOGLOBIN: Hemoglobin: 12 g/dL (ref 12.0–15.0)

## 2019-06-05 NOTE — Discharge Instructions (Signed)
Chronic Kidney Disease, Adult Chronic kidney disease (CKD) happens when the kidneys are damaged over a long period of time. The kidneys are two organs that help with:  Getting rid of waste and extra fluid from the blood.  Making hormones that maintain the amount of fluid in your tissues and blood vessels.  Making sure that the body has the right amount of fluids and chemicals. Most of the time, CKD does not go away, but it can usually be controlled. Steps must be taken to slow down the kidney damage or to stop it from getting worse. If this is not done, the kidneys may stop working. Follow these instructions at home: Medicines  Take over-the-counter and prescription medicines only as told by your doctor. You may need to change the amount of medicines you take.  Do not take any new medicines unless your doctor says it is okay. Many medicines can make your kidney damage worse.  Do not take any vitamin and supplements unless your doctor says it is okay. Many vitamins and supplements can make your kidney damage worse. General instructions  Follow a diet as told by your doctor. You may need to stay away from: ? Alcohol. ? Salty foods. ? Foods that are high in:  Potassium.  Calcium.  Protein.  Do not use any products that contain nicotine or tobacco, such as cigarettes and e-cigarettes. If you need help quitting, ask your doctor.  Keep track of your blood pressure at home. Tell your doctor about any changes.  If you have diabetes, keep track of your blood sugar as told by your doctor.  Try to stay at a healthy weight. If you need help, ask your doctor.  Exercise at least 30 minutes a day, 5 days a week.  Stay up-to-date with your shots (immunizations) as told by your doctor.  Keep all follow-up visits as told by your doctor. This is important. Contact a doctor if:  Your symptoms get worse.  You have new symptoms. Get help right away if:  You have symptoms of end-stage  kidney disease. These may include: ? Headaches. ? Numbness in your hands or feet. ? Easy bruising. ? Having hiccups often. ? Chest pain. ? Shortness of breath. ? Stopping of menstrual periods in women.  You have a fever.  You have very little pee (urine).  You have pain or bleeding when you pee. Summary  Chronic kidney disease (CKD) happens when the kidneys are damaged over a long period of time.  Most of the time, this condition does not go away, but it can usually be controlled. Steps must be taken to slow down the kidney damage or to stop it from getting worse.  Treatment may include a combination of medicines and lifestyle changes. This information is not intended to replace advice given to you by your health care provider. Make sure you discuss any questions you have with your health care provider. Document Released: 09/15/2009 Document Revised: 06/03/2017 Document Reviewed: 07/26/2016 Elsevier Patient Education  [place].    Anemia  Anemia is a condition in which you do not have enough red blood cells or hemoglobin. Hemoglobin is a substance in red blood cells that carries oxygen. When you do not have enough red blood cells or hemoglobin (are anemic), your body cannot get enough oxygen and your organs may not work properly. As a result, you may feel very tired or have other problems. What are the causes? Common causes of anemia include:  Excessive bleeding.  Anemia can be caused by excessive bleeding inside or outside the body, including bleeding from the intestine or from periods in women.  Poor nutrition.  Long-lasting (chronic) kidney, thyroid, and liver disease.  Bone marrow disorders.  Cancer and treatments for cancer.  HIV (human immunodeficiency virus) and AIDS (acquired immunodeficiency syndrome).  Treatments for HIV and AIDS.  Spleen problems.  Blood disorders.  Infections, medicines, and autoimmune disorders that destroy red blood  cells. What are the signs or symptoms? Symptoms of this condition include:  Minor weakness.  Dizziness.  Headache.  Feeling heartbeats that are irregular or faster than normal (palpitations).  Shortness of breath, especially with exercise.  Paleness.  Cold sensitivity.  Indigestion.  Nausea.  Difficulty sleeping.  Difficulty concentrating. Symptoms may occur suddenly or develop slowly. If your anemia is mild, you may not have symptoms. How is this diagnosed? This condition is diagnosed based on:  Blood tests.  Your medical history.  A physical exam.  Bone marrow biopsy. Your health care provider may also check your stool (feces) for blood and may do additional testing to look for the cause of your bleeding. You may also have other tests, including:  Imaging tests, such as a CT scan or MRI.  Endoscopy.  Colonoscopy. How is this treated? Treatment for this condition depends on the cause. If you continue to lose a lot of blood, you may need to be treated at a hospital. Treatment may include:  Taking supplements of iron, vitamin S06, or folic acid.  Taking a hormone medicine (erythropoietin) that can help to stimulate red blood cell growth.  Having a blood transfusion. This may be needed if you lose a lot of blood.  Making changes to your diet.  Having surgery to remove your spleen. Follow these instructions at home:  Take over-the-counter and prescription medicines only as told by your health care provider.  Take supplements only as told by your health care provider.  Follow any diet instructions that you were given.  Keep all follow-up visits as told by your health care provider. This is important. Contact a health care provider if:  You develop new bleeding anywhere in the body. Get help right away if:  You are very weak.  You are short of breath.  You have pain in your abdomen or chest.  You are dizzy or feel faint.  You have trouble  concentrating.  You have bloody or black, tarry stools.  You vomit repeatedly or you vomit up blood. Summary  Anemia is a condition in which you do not have enough red blood cells or enough of a substance in your red blood cells that carries oxygen (hemoglobin).  Symptoms may occur suddenly or develop slowly.  If your anemia is mild, you may not have symptoms.  This condition is diagnosed with blood tests as well as a medical history and physical exam. Other tests may be needed.  Treatment for this condition depends on the cause of the anemia. This information is not intended to replace advice given to you by your health care provider. Make sure you discuss any questions you have with your health care provider. Document Released: 07/29/2004 Document Revised: 06/03/2017 Document Reviewed: 07/23/2016 Elsevier Patient Education  2020 [employer].

## 2019-06-05 NOTE — Telephone Encounter (Signed)
I suspect leg swelling could be some fluid retention We will confirm she is taking Lasix 40 daily She may want to try Lasix 40 twice daily for 3 days, morning and 2:00 Then back to 40 daily Suspect less likely the bisoprolol

## 2019-06-05 NOTE — Telephone Encounter (Signed)
Spoke with patient and reviewed provider recommendations in detail. She read back information and verbalized understanding of all instructions. She was appreciative for the call back and had no further questions or concerns at this time.

## 2019-06-14 ENCOUNTER — Telehealth: Payer: Self-pay | Admitting: Cardiovascular Disease

## 2019-06-14 DIAGNOSIS — I25118 Atherosclerotic heart disease of native coronary artery with other forms of angina pectoris: Secondary | ICD-10-CM

## 2019-06-14 NOTE — Telephone Encounter (Signed)
Attempted to call the patient. Phone rang 10 + times with no answer & no voice mail. We will call back tomorrow.

## 2019-06-14 NOTE — Telephone Encounter (Signed)
Pt c/o swelling: STAT is pt has developed SOB within 24 hours  1) How much weight have you gained and in what time span? 10 lb in 1-2 weeks   2) If swelling, where is the swelling located? BLE able to push dent in legs   3) Are you currently taking a fluid pill? See previous appt patient says med change did not help   4) Are you currently SOB? Yes but chronic has noticed slight increased   5) Do you have a log of your daily weights (if so, list)? Normal 175-177  Current 188  6) Have you gained 3 pounds in a day or 5 pounds in a week? Not sure   7) Have you traveled recently? No  Patient says she has an appt tomorrow so if no answer on call back leave detailed msg with instructions .

## 2019-06-15 NOTE — Telephone Encounter (Signed)
Spoke with patient and she states that she is no better. She reports elevated weight with swelling and I can hear audible wheezing over the phone. She has been alternating fluid pill with once daily and twice daily but has to use extreme caution because of her kidneys. She did not weigh today but reports no changes. Offered her appointment and scheduled her to come see provider on 06/26/2019. She was agreeable with this plan and verbalized understanding signs and symptoms which would require evaluation in the ED. She had no further questions at this time.

## 2019-06-17 NOTE — Telephone Encounter (Signed)
We need a bnp and CMP If unable to have this done before office visit, We have have to wait until visit in office

## 2019-06-18 ENCOUNTER — Ambulatory Visit
Admission: RE | Admit: 2019-06-18 | Discharge: 2019-06-18 | Disposition: A | Discharge status: Home / Self Care | Payer: Medicare Other | Source: Ambulatory Visit | Attending: Nephrology | Admitting: Nephrology

## 2019-06-18 DIAGNOSIS — D631 Anemia in chronic kidney disease: Secondary | ICD-10-CM | POA: Diagnosis not present

## 2019-06-18 DIAGNOSIS — N184 Chronic kidney disease, stage 4 (severe): Secondary | ICD-10-CM | POA: Insufficient documentation

## 2019-06-18 LAB — IRON AND TIBC
Iron: 90 ug/dL (ref 28–170)
Saturation Ratios: 33 % — ABNORMAL HIGH (ref 10.4–31.8)
TIBC: 276 ug/dL (ref 250–450)
UIBC: 186 ug/dL

## 2019-06-18 LAB — TRANSFERRIN: Transferrin: 195 mg/dL (ref 192–382)

## 2019-06-18 LAB — FERRITIN: Ferritin: 315 ng/mL — ABNORMAL HIGH (ref 11–307)

## 2019-06-18 LAB — HEMOGLOBIN: Hemoglobin: 11.9 g/dL — ABNORMAL LOW (ref 12.0–15.0)

## 2019-06-18 MED ORDER — EPOETIN ALFA-EPBX 10000 UNIT/ML IJ SOLN: 30000.0000 [IU] | Freq: Once | INTRAMUSCULAR | Status: AC

## 2019-06-18 MED ORDER — EPOETIN ALFA-EPBX 10000 UNIT/ML IJ SOLN
INTRAMUSCULAR | Status: AC
  Administered 2019-06-18: 30000 [IU] via SUBCUTANEOUS
  Filled 2019-06-18: qty 3

## 2019-06-18 NOTE — Telephone Encounter (Signed)
Left voicemail message that we would like to order some labs but if not possible we can do at her upcoming visit with instructions to call back if questions.

## 2019-06-19 ENCOUNTER — Ambulatory Visit: Payer: Medicare Other

## 2019-06-19 NOTE — Telephone Encounter (Signed)
Left voicemail message that provider would like some labs to be done if possible before her upcoming appointment but if not possible then we can do them that day at her visit with instructions to call back if any questions.

## 2019-06-19 NOTE — Telephone Encounter (Signed)
Patient returning call about labs.  No orders in epic .  Please call patient or advise.

## 2019-06-19 NOTE — Addendum Note (Signed)
Addended by: Valora Corporal on: 06/19/2019 04:17 PM   Modules accepted: Orders

## 2019-06-19 NOTE — Telephone Encounter (Signed)
BNP & CMP ordered and will fax over to [facility].

## 2019-06-19 NOTE — Telephone Encounter (Signed)
Patient would like lab orders to be faxed to [facility] in [place]. Patient will go on Thursday

## 2019-06-25 NOTE — Progress Notes (Addendum)
Cardiology Office Note  Date:  06/26/2019   ID:  [name], DOB 01/10/1941, MRN [id number]  PCP:  Kim Evens, MD   Chief Complaint  Patient presents with  . other    C/o sob, swelling and weezing. Meds reviewed verbally with pt.    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  SOB, weight up Reports that she has never weighed this much Typical weight is in the 170 range, weight today 193 pounds Reports that she is taking Lasix faithfully, unclear degree of her fluid intake Reports she was unable to tolerate bisoprolol, caused diarrhea Walked long distance into the hospital, did not think she was going to make it, very short of breath.  Heart rate up to 130 A. fib on exam today  Anticoagulation previously held for anemia Hemoglobin has improved to 11.9 Followed by oncology/hematology Stable over the past several months  Recent back surgery with Dr. Arnoldo Morale, reports improved pain in her upper back around her shoulder blades Feels she recovered well Has follow-up next month Prior MRI  showing moderate to severe spinal stenosis, severe foraminal stenosis bilaterally, vertebral compression fractures  EKG personally reviewed by myself on todays visit Shows narrow complex tachycardia concerning for atrial fibrillation rate 134 bpm  Other past medical history reviewed Off plavix and asa, pradaxa : "[facility] stopped it" creatinine 2.36 in July  2019 CR 1.6 in dec 2019   fall late 2019  Echocardiogram ejection fraction 65-70 percent  Previous workup for lower extremity vascular disease   showing moderate nonobstructive disease, medical management recommended    PMH:   has a past medical history of Anemia, CAD (coronary artery disease), Carotid artery disease ([place]), Cervical disc disease, CKD (chronic kidney disease), stage III, Closed fracture of multiple pubic rami ([place]) (06/24/2018), COPD (chronic obstructive pulmonary disease) ([place]), Diastolic dysfunction, Dysphagia, GERD (gastroesophageal reflux disease), History of kidney stones, Hypertension, Hypertriglyceridemia, Hypothyroidism, Myocardial infarction ([place]) (2003), PAD (peripheral artery disease) ([employer]), PAF (paroxysmal atrial fibrillation) ([place]), PONV (postoperative nausea and vomiting), and Right renal artery stenosis ([place]).  PSH:    Past Surgical History:  Procedure Laterality Date  . ABDOMINAL HYSTERECTOMY    . AMPUTATION Right 04/12/2014   Procedure: Revision AMPUTATION Right Long DIGIT;  Surgeon: Leanora Cover, MD;  Location: [facility];  Service: Orthopedics;  Laterality: Right;  . APPLICATION OF WOUND VAC Left 01/06/2018   Procedure: APPLICATION OF WOUND VAC;  Surgeon: Samara Deist, DPM;  Location: [facility];  Service: Podiatry;  Laterality: Left;  . BALLOON DILATION  05/05/2011   Procedure: BALLOON DILATION;  Surgeon: Rogene Houston, MD;  [facility];  Service: Endoscopy;  Laterality: N/A;  . CARDIAC CATHETERIZATION     with stent 2004  CYPHER 2.75 x 69mm coronary stent in the mid left anterior descending stenotic lesion    Last stress test showed EF of 775  . CARDIOVERSION    . CATARACT EXTRACTION Left 2002  . CATARACT  EXTRACTION W/PHACO Right 05/14/2013   Procedure: CATARACT EXTRACTION PHACO AND INTRAOCULAR LENS PLACEMENT RIGHT EYE;  Surgeon: Tonny Branch, MD;  Location: [facility];  Service: Ophthalmology;  Laterality: Right;  CDE:  20.45  . CERVICAL DISC SURGERY     . CHOLECYSTECTOMY    . CORONARY ANGIOPLASTY     1 stent  . ESOPHAGEAL DILATION N/A 04/25/2015   Procedure: ESOPHAGEAL DILATION;  Surgeon: Rogene Houston, MD;  [facility];  Service: Endoscopy;  Laterality: N/A;  . ESOPHAGOGASTRODUODENOSCOPY N/A 04/25/2015   Procedure: ESOPHAGOGASTRODUODENOSCOPY (EGD);  Surgeon: Rogene Houston, MD;  [facility];  Service: Endoscopy;  Laterality: N/A;  1225  . IR SACROPLASTY BILATERAL  06/13/2018  . IRRIGATION AND DEBRIDEMENT FOOT Left 01/06/2018   Procedure: IRRIGATION AND DEBRIDEMENT FOOT;  Surgeon: Samara Deist, DPM;  Location: [facility];  Service: Podiatry;  Laterality: Left;  Marland Kitchen MALONEY DILATION  05/05/2011   Procedure: MALONEY DILATION;  Surgeon: Rogene Houston, MD;  [facility];  Service: Endoscopy;  Laterality: N/A;  . right knee arthroscopy  2010  . SAVORY DILATION  05/05/2011   Procedure: SAVORY DILATION;  Surgeon: Rogene Houston, MD;  [facility];  Service: Endoscopy;  Laterality: N/A;  . SHOULDER SURGERY Right    torn rotator cuff  . TOENAIL EXCISION    . TOTAL ABDOMINAL HYSTERECTOMY W/ BILATERAL SALPINGOOPHORECTOMY      Current Outpatient Medications  Medication Sig Dispense Refill  . acetaminophen (TYLENOL) 500 MG tablet Take 500-1,000 mg by mouth 2 (two) times daily as needed for headache (pain). Lunch and bedtime    . albuterol (PROVENTIL HFA;VENTOLIN HFA) 108 (90 Base) MCG/ACT inhaler Inhale 2 puffs into the lungs every 4 (four) hours as needed for wheezing or shortness of breath.    Marland Kitchen albuterol (PROVENTIL) (2.5 MG/3ML) 0.083% nebulizer solution Take 2.5 mg by nebulization every 6 (six) hours as needed for shortness of breath.     . Ascorbic Acid (VITAMIN C) 1000 MG tablet Take 1,000 mg by mouth daily.    . bisacodyl (DULCOLAX) 10 MG suppository Place 1 suppository (10 mg total) rectally daily as needed for moderate constipation. 12 suppository 0  . calcitRIOL (ROCALTROL) 0.5 MCG capsule  Take 0.5 mcg by mouth every other day.     . cetirizine (ZYRTEC) 10 MG tablet Take 10 mg by mouth daily.     . clonazePAM (KLONOPIN) 1 MG tablet Take 2-3 mg by mouth at bedtime.     . diphenhydrAMINE (BENADRYL) 25 mg capsule Take 1 capsule (25 mg total) by mouth every 4 (four) hours as needed for itching. 30 capsule 0  . Fluticasone-Salmeterol (ADVAIR) 500-50 MCG/DOSE AEPB Inhale 1 puff into the lungs 2 (two) times daily.    . isosorbide mononitrate (IMDUR) 60 MG 24 hr tablet TAKE ONE TABLET (60MG  TOTAL) BY MOUTH TWO TIMES DAILY 180 tablet 2  . levothyroxine (SYNTHROID) 100 MCG tablet Take 100 mcg by mouth daily.    . nitroGLYCERIN (NITROLINGUAL) 0.4 MG/SPRAY spray PLACE ONE SPRAY UNDER THE TONGUE EVERY 5MINUTES FOR 3 DOSES AS NEEDEDFOR CHEST PAIN. 12 g 3  . omega-3 acid ethyl esters (LOVAZA) 1 G capsule Take 2 g by mouth 2 (two) times daily.      . vitamin B-12 (CYANOCOBALAMIN) 1000 MCG tablet Take 1,000 mcg by mouth daily.     . metoprolol succinate (TOPROL-XL) 25 MG 24 hr tablet Take 1 tablet (25 mg total) by mouth daily. 90 tablet 2  .  torsemide (DEMADEX) 20 MG tablet Take 2 tabs (40 mg) twice daily for wt > 185 lbs, take 1 tab (20 mg) twice daily for wt 180-185 lbs, take 1 tab (20 mg) once daily for weight < 180 lbs 180 tablet 1   No current facility-administered medications for this visit.     Allergies:   Ciprofloxacin, Penicillins, Amlodipine, Morphine and related, Cefaclor, Codeine, Lipitor [atorvastatin], Prednisone, and Sulfa antibiotics   Social History:  The patient  reports that she has been smoking cigarettes. She has a 56.00 pack-year smoking history. She has never used smokeless tobacco. She reports current alcohol use of about 1.0 standard drinks of alcohol per week. She reports that she does not use drugs.   Family History:   family history includes Diabetes in her mother; Heart attack in her father; Heart disease in her brother and sister; Heart failure in her mother;  Hypertension in her sister; Pancreatic cancer in her sister; Stroke in her mother.    Review of Systems: Review of Systems  Constitutional: Negative.        Weight gain  HENT: Negative.   Respiratory: Positive for shortness of breath.   Cardiovascular: Positive for leg swelling.  Gastrointestinal: Negative.   Musculoskeletal: Positive for back pain.  Neurological: Negative.   Psychiatric/Behavioral: Negative.   All other systems reviewed and are negative.   PHYSICAL EXAM: VS:  BP (!) 150/60 (BP Location: Left Arm, Patient Position: Sitting, Cuff Size: Normal)   Pulse (!) 134   Ht 5\' 7"  (1.702 m)   Wt 193 lb (87.5 kg)   SpO2 96%   BMI 30.23 kg/m  , BMI Body mass index is 30.23 kg/m. Constitutional:  oriented to person, place, and time. No distress.  Presents in a wheelchair HENT:  Head: Grossly normal Eyes:  no discharge. No scleral icterus.  Neck: No JVD, no carotid bruits  Cardiovascular: Irregularly irregular, no murmurs appreciated 2+ pitting lower extremity edema to the thighs Pulmonary/Chest: Poor breath sounds, wheezing Abdominal: Soft.  no distension.  no tenderness.  Musculoskeletal: Normal range of motion Neurological:  normal muscle tone. Coordination normal. No atrophy Skin: Skin warm and dry Psychiatric: normal affect, pleasant   Recent Labs: 06/28/2018: BUN 18; Potassium 3.6; Sodium 140 07/01/2018: Platelets 256 01/30/2019: Creatinine, Ser 0.80 06/18/2019: Hemoglobin 11.9   Lipid Panel No results found for: CHOL, HDL, LDLCALC, TRIG    Wt Readings from Last 3 Encounters:  06/26/19 193 lb (87.5 kg)  02/13/19 181 lb 12 oz (82.4 kg)  11/16/18 170 lb (77.1 kg)      ASSESSMENT AND PLAN:  Paroxysmal atrial fibrillation ([place]) - Plan: EKG 12-Lead EKG concerning for recurrent arrhythmia on today's visit in the setting of fluid overload We will change her Lasix to torsemide, details below --- Hemoglobin stable, Pradaxa previously held for anemia, held  back 2019 Amiodarone and metoprolol previously held for bradycardia No recent falls but she is a fall risk -We will discuss with her on restarting Pradaxa Recommend she restart metoprolol succinate 25 daily given rapid rate concern for recurrent atrial fibrillation -Likely worsening her heart failure symptoms  Essential hypertension - We will add metoprolol succinate 25 daily She did not tolerate bisoprolol Continue isosorbide  Chronic back pain Referral made to [facility]surgery in [facility] Spinal stenosis, foraminal stenosis, vertebral fractures Referral discussed with her.  Reports that she is unable to tolerate pain medication, having a difficult time getting around secondary to progressive symptoms  Coronary artery disease involving native coronary artery of  native heart without angina pectoris -  Denies any anginal symptoms No ischemic work-up at this time Smoking cessation recommended  Acute on chronic renal failure Stage IV Followed in [place] No recent lab work available  Acute on chronic diastolic CHF Weight is up 15 to 20 pounds Massive pitting edema Possibly exacerbated by rhythm change Stressed importance of moderating her fluid intake, compliance with diuretic Suggested she follow the diuretic regiment detailed below: Torsemide 40 mg twice a day for weight >185 Torsemide 20 mg twice a day for weight  180 - 185 Torsemide 20 mg once a day for weight <180  Lower extremity edema Prior clinic visit had no significant leg edema, now with massive weight gain and edema, worse shortness of breath Plan as above  Mixed hyperlipidemia She was previously on Zetia, this appears to have fallen off her list Were discussed with her in follow-up  PAD Lower extremity arterial stenosis, 50-75% stenosis mid right SFA She is very sedentary at baseline,  Does not want a statin, will look to restart Zetia  SOB (shortness of breath)  chronic bronchitis.  COPD, long smoking  history Uses nebulizers Now with acute on chronic diastolic CHF REDS VEST score is 30% on today's visit  Leg pain /back pain Chronic issue, Improved symptoms in her upper thoracic back, pain improved She has follow-up with Dr. Arnoldo Morale  Very complicated medical issues as detailed above  Total encounter time more than 45 minutes  Greater than 50% was spent in counseling and coordination of care with the patient   Orders Placed This Encounter  Procedures  . EKG 12-Lead     Signed, Esmond Plants, M.D., Ph.D. 06/26/2019  [place], [place]

## 2019-06-26 ENCOUNTER — Ambulatory Visit (INDEPENDENT_AMBULATORY_CARE_PROVIDER_SITE_OTHER): Payer: Medicare Other | Admitting: Cardiovascular Disease

## 2019-06-26 ENCOUNTER — Telehealth: Payer: Self-pay | Admitting: Cardiovascular Disease

## 2019-06-26 ENCOUNTER — Encounter: Payer: Self-pay | Admitting: Cardiovascular Disease

## 2019-06-26 ENCOUNTER — Other Ambulatory Visit: Payer: Self-pay

## 2019-06-26 VITALS — BP 150/60 | HR 134 | Ht 67.0 in | Wt 193.0 lb

## 2019-06-26 DIAGNOSIS — J432 Centrilobular emphysema: Secondary | ICD-10-CM

## 2019-06-26 DIAGNOSIS — R0602 Shortness of breath: Secondary | ICD-10-CM

## 2019-06-26 DIAGNOSIS — R600 Localized edema: Secondary | ICD-10-CM | POA: Diagnosis not present

## 2019-06-26 DIAGNOSIS — I739 Peripheral vascular disease, unspecified: Secondary | ICD-10-CM

## 2019-06-26 DIAGNOSIS — I480 Paroxysmal atrial fibrillation: Secondary | ICD-10-CM | POA: Diagnosis not present

## 2019-06-26 DIAGNOSIS — I10 Essential (primary) hypertension: Secondary | ICD-10-CM

## 2019-06-26 DIAGNOSIS — N183 Chronic kidney disease, stage 3 unspecified: Secondary | ICD-10-CM

## 2019-06-26 DIAGNOSIS — I25118 Atherosclerotic heart disease of native coronary artery with other forms of angina pectoris: Secondary | ICD-10-CM | POA: Diagnosis not present

## 2019-06-26 DIAGNOSIS — I5033 Acute on chronic diastolic (congestive) heart failure: Secondary | ICD-10-CM | POA: Diagnosis not present

## 2019-06-26 DIAGNOSIS — E782 Mixed hyperlipidemia: Secondary | ICD-10-CM

## 2019-06-26 MED ORDER — TORSEMIDE 20 MG PO TABS: ORAL_TABLET | ORAL | 1 refills | Status: DC

## 2019-06-26 MED ORDER — METOPROLOL SUCCINATE ER 25 MG PO TB24: 25.0000 mg | ORAL_TABLET | Freq: Every day | ORAL | 2 refills | Status: DC

## 2019-06-26 NOTE — Telephone Encounter (Signed)
Office visit EKG concerning for atrial fibrillation We focused on management of her heart failure on her visit Did not get a chance to discuss restarting Pradaxa 150 mg twice daily with her Her blood count is stable Pradaxa previously held 2019 for anemia Would she consider restarting the Pradaxa for stroke prevention?  [place]

## 2019-06-26 NOTE — Patient Instructions (Addendum)
Medication Instructions:  1) Please stop the lasix  2) Torsemide sliding scale: Torsemide 40 mg twice a day for weight >185 Torsemide 20 mg twice a day for weight  180 - 185 Torsemide 20 mg once a day for weight <180  3) Start metoprolol succinate 25 mg daily  4) call the office in a week with your daily weights [phone]  If you need a refill on your cardiac medications before your next appointment, please call your pharmacy.    Lab work: No new labs needed   If you have labs (blood work) drawn today and your tests are completely normal, you will receive your results only by: Marland Kitchen MyChart Message (if you have MyChart) OR . A paper copy in the mail If you have any lab test that is abnormal or we need to change your treatment, we will call you to review the results.   Testing/Procedures: No new testing needed   Follow-Up: At [facility], you and your health needs are our priority.  As part of our continuing mission to provide you with exceptional heart care, we have created designated Provider Care Teams.  These Care Teams include your primary Cardiologist (physician) and Advanced Practice Providers (APPs -  Physician Assistants and Nurse Practitioners) who all work together to provide you with the care you need, when you need it.  . You will need a follow up appointment in 2 month   . Providers on your designated Care Team:   . Murray Hodgkins, NP . Christell Faith, PA-C . Marrianne Mood, PA-C  Any Other Special Instructions Will Be Listed Below (If Applicable).  For educational health videos Log in to : [email or web address] Or : [email or web address], password : [email or web address]

## 2019-06-27 NOTE — Telephone Encounter (Signed)
LMTCB

## 2019-06-28 NOTE — Telephone Encounter (Signed)
Attempted to call the patient. No answer- I left a message to please call back.  

## 2019-07-02 ENCOUNTER — Ambulatory Visit: Payer: Medicare Other | Attending: Nephrology

## 2019-07-02 NOTE — Telephone Encounter (Signed)
Patient returning call .    She states she has lost 7 lbs but thinks the fluid pill is too strong.  She would like to stagger taking it or reduce dose.   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

## 2019-07-02 NOTE — Telephone Encounter (Signed)
Please call after 3 as patient is not home.

## 2019-07-04 ENCOUNTER — Ambulatory Visit
Admission: RE | Admit: 2019-07-04 | Discharge: 2019-07-04 | Disposition: A | Discharge status: Home / Self Care | Payer: Medicare Other | Source: Ambulatory Visit | Attending: Nephrology | Admitting: Nephrology

## 2019-07-04 DIAGNOSIS — D631 Anemia in chronic kidney disease: Secondary | ICD-10-CM | POA: Insufficient documentation

## 2019-07-04 DIAGNOSIS — N184 Chronic kidney disease, stage 4 (severe): Secondary | ICD-10-CM | POA: Insufficient documentation

## 2019-07-04 LAB — IRON AND TIBC
Iron: 67 ug/dL (ref 28–170)
Saturation Ratios: 23 % (ref 10.4–31.8)
TIBC: 290 ug/dL (ref 250–450)
UIBC: 223 ug/dL

## 2019-07-04 LAB — TRANSFERRIN: Transferrin: 201 mg/dL (ref 192–382)

## 2019-07-04 LAB — HEMOGLOBIN: Hemoglobin: 12.9 g/dL (ref 12.0–15.0)

## 2019-07-04 LAB — FERRITIN: Ferritin: 316 ng/mL — ABNORMAL HIGH (ref 11–307)

## 2019-07-05 NOTE — Telephone Encounter (Signed)
Left voicemail message to call back and that I did see note where we should call later and that I would try calling again after that time.

## 2019-07-05 NOTE — Telephone Encounter (Signed)
Spoke with patient and reviewed Dr. Donivan Scull recommendations and she absolutely will not start Pradaxa back because kidney doctor took her off and she does not want to risk more kidney damage. Advised I would make provider aware of her decision to not restart. She was appreciative for the call with no further questions.

## 2019-07-09 ENCOUNTER — Telehealth: Payer: Self-pay

## 2019-07-09 NOTE — Telephone Encounter (Signed)
-----   Message from Theora Gianotti, NP sent at 07/05/2019  7:57 AM EST ----- Hgb, Transferrin wnl. Ferritin stable.

## 2019-07-09 NOTE — Telephone Encounter (Signed)
Call to patient to review lab results. No further orders or questions at this time.   Advised pt to call for any further questions or concerns.

## 2019-07-10 ENCOUNTER — Other Ambulatory Visit: Payer: Self-pay

## 2019-07-10 ENCOUNTER — Encounter: Payer: Medicare HMO | Attending: Physician Assistant | Admitting: Physician Assistant

## 2019-07-10 DIAGNOSIS — L97812 Non-pressure chronic ulcer of other part of right lower leg with fat layer exposed: Secondary | ICD-10-CM | POA: Diagnosis not present

## 2019-07-10 DIAGNOSIS — S81801A Unspecified open wound, right lower leg, initial encounter: Secondary | ICD-10-CM | POA: Diagnosis not present

## 2019-07-10 DIAGNOSIS — I7389 Other specified peripheral vascular diseases: Secondary | ICD-10-CM | POA: Insufficient documentation

## 2019-07-10 DIAGNOSIS — I10 Essential (primary) hypertension: Secondary | ICD-10-CM | POA: Insufficient documentation

## 2019-07-10 DIAGNOSIS — I252 Old myocardial infarction: Secondary | ICD-10-CM | POA: Diagnosis not present

## 2019-07-10 DIAGNOSIS — S81802A Unspecified open wound, left lower leg, initial encounter: Secondary | ICD-10-CM | POA: Insufficient documentation

## 2019-07-10 DIAGNOSIS — J449 Chronic obstructive pulmonary disease, unspecified: Secondary | ICD-10-CM | POA: Diagnosis not present

## 2019-07-10 DIAGNOSIS — I480 Paroxysmal atrial fibrillation: Secondary | ICD-10-CM | POA: Diagnosis not present

## 2019-07-10 DIAGNOSIS — X58XXXA Exposure to other specified factors, initial encounter: Secondary | ICD-10-CM | POA: Diagnosis not present

## 2019-07-10 DIAGNOSIS — I2510 Atherosclerotic heart disease of native coronary artery without angina pectoris: Secondary | ICD-10-CM | POA: Diagnosis not present

## 2019-07-10 DIAGNOSIS — L97825 Non-pressure chronic ulcer of other part of left lower leg with muscle involvement without evidence of necrosis: Secondary | ICD-10-CM | POA: Insufficient documentation

## 2019-07-10 DIAGNOSIS — F17218 Nicotine dependence, cigarettes, with other nicotine-induced disorders: Secondary | ICD-10-CM | POA: Diagnosis not present

## 2019-07-10 NOTE — Progress Notes (Signed)
Kim, [name] ([id number]) Visit Report for 07/10/2019 Abuse/Suicide Risk Screen Details Patient Name: [name], [name]. Date of Service: 07/10/2019 2:15 PM Medical Record Number: [id number] Patient Account Number: [id number] Date of Birth/Sex: February 14, 1941 (79 y.o. F) Treating RN: Army Melia Primary Care Afton Mikelson: Lemmie Evens Other Clinician: Referring Gurman Ashland: Referral, Self Treating Samamtha Tiegs/Extender: STONE III, HOYT Weeks in Treatment: 0 Abuse/Suicide Risk Screen Items Answer ABUSE RISK SCREEN: Has anyone close to you tried to hurt or harm you recentlyo No Do you feel uncomfortable with anyone in your familyo No Has anyone forced you do things that you didnot want to doo No Electronic Signature(s) Signed: 07/10/2019 4:03:57 PM By: Army Melia Entered By: Army Melia on 07/10/2019 14:46:53 Enerson, [name] ([id number]) -------------------------------------------------------------------------------- Activities of Daily Living Details Patient Name: Kim, [name]. Date of Service: 07/10/2019 2:15 PM Medical Record Number: [id number] Patient Account Number: [id number] Date of Birth/Sex: 04-May-1941 (79 y.o. F) Treating RN: Army Melia Primary Care Ash Mcelwain: Lemmie Evens Other Clinician: Referring Joselynne Killam: Referral, Self Treating Berneita Sanagustin/Extender: STONE III, HOYT Weeks in Treatment: 0 Activities of Daily Living Items Answer Activities of Daily Living (Please select one for each item) Drive Automobile Not Able Take Medications Completely Able Use Telephone Completely Able Care for Appearance Completely Able Use Toilet Completely Able Bath / Shower Completely Able Dress Self Completely Able Feed Self Completely Able Walk Completely Able Get In / Out Bed Completely Able Housework Completely Able Prepare Meals Completely [place] Completely Able Shop for Self Completely Able Electronic Signature(s) Signed: 07/10/2019 4:03:57 PM By: Army Melia Entered By:  Army Melia on 07/10/2019 14:47:08 Bohanon, [name] ([id number]) -------------------------------------------------------------------------------- Education Screening Details Patient Name: [name] Date of Service: 07/10/2019 2:15 PM Medical Record Number: [id number] Patient Account Number: [id number] Date of Birth/Sex: 1941/02/04 (79 y.o. F) Treating RN: Army Melia Primary Care Velisa Regnier: Lemmie Evens Other Clinician: Referring Giada Schoppe: Referral, Self Treating Tenise Stetler/Extender: Melburn Hake, HOYT Weeks in Treatment: 0 Primary Learner Assessed: Patient Learning Preferences/Education Level/Primary Language Learning Preference: Explanation Highest Education Level: Grade School Preferred Language: English Cognitive Barrier Language Barrier: No Translator Needed: No Memory Deficit: No Emotional Barrier: No Cultural/Religious Beliefs Affecting Medical Care: No Physical Barrier Impaired Vision: No Impaired Hearing: No Decreased Hand dexterity: No Knowledge/Comprehension Knowledge Level: High Comprehension Level: High Ability to understand written High instructions: Ability to understand verbal High instructions: Motivation Anxiety Level: Calm Cooperation: Cooperative Education Importance: Acknowledges Need Interest in Health Problems: Asks Questions Perception: Coherent Willingness to Engage in Self- High Management Activities: Readiness to Engage in Self- High Management Activities: Electronic Signature(s) Signed: 07/10/2019 4:03:57 PM By: Army Melia Entered By: Army Melia on 07/10/2019 14:47:27 Zell, [name] ([id number]) -------------------------------------------------------------------------------- Fall Risk Assessment Details Patient Name: [name] Date of Service: 07/10/2019 2:15 PM Medical Record Number: [id number] Patient Account Number: [id number] Date of Birth/Sex: 1940-08-06 (79 y.o. F) Treating RN: Army Melia Primary Care Emerly Prak:  Lemmie Evens Other Clinician: Referring Natalin Bible: Referral, Self Treating Hughes Wyndham/Extender: Melburn Hake, HOYT Weeks in Treatment: 0 Fall Risk Assessment Items Have you had 2 or more falls in the last 12 monthso 0 No Have you had any fall that resulted in injury in the last 12 monthso 0 No FALLS RISK SCREEN History of falling - immediate or within 3 months 0 No Secondary diagnosis (Do you have 2 or more medical diagnoseso) 0 No Ambulatory aid None/bed rest/wheelchair/nurse 0 No Crutches/cane/walker 0 No Furniture 0 No Intravenous therapy Access/Saline/Heparin Lock 0 No Gait/Transferring Normal/ bed rest/ wheelchair 0 No Weak (short  steps with or without shuffle, stooped but able to lift head while 0 No walking, may seek support from furniture) Impaired (short steps with shuffle, may have difficulty arising from chair, head 0 No down, impaired balance) Mental Status Oriented to own ability 0 No Electronic Signature(s) Signed: 07/10/2019 4:03:57 PM By: Army Melia Entered By: Army Melia on 07/10/2019 14:47:32 Evett, [name] ([id number]) -------------------------------------------------------------------------------- Foot Assessment Details Patient Name: [name]. Date of Service: 07/10/2019 2:15 PM Medical Record Number: [id number] Patient Account Number: [id number] Date of Birth/Sex: [birth year] (79 y.o. F) Treating RN: Army Melia Primary Care Jennae Hakeem: Lemmie Evens Other Clinician: Referring Jhada Risk: Referral, Self Treating Larri Brewton/Extender: STONE III, HOYT Weeks in Treatment: 0 Foot Assessment Items Site Locations + = Sensation present, - = Sensation absent, C = Callus, U = Ulcer R = Redness, W = Warmth, M = Maceration, PU = Pre-ulcerative lesion F = Fissure, S = Swelling, D = Dryness Assessment Right: Left: Other Deformity: No No Prior Foot Ulcer: No No Prior Amputation: No No Charcot Joint: No No Ambulatory Status: Ambulatory With Help Assistance  Device: Walker Gait: Steady Electronic Signature(s) Signed: 07/10/2019 4:03:57 PM By: Army Melia Entered By: Army Melia on 07/10/2019 14:48:31 Wrinkle, [name] ([id number]) -------------------------------------------------------------------------------- Nutrition Risk Screening Details Patient Name: [name]. Date of Service: 07/10/2019 2:15 PM Medical Record Number: [id number] Patient Account Number: [id number] Date of Birth/Sex: 08/01/1940 (79 y.o. F) Treating RN: Army Melia Primary Care Daielle Melcher: Lemmie Evens Other Clinician: Referring Charistopher Rumble: Referral, Self Treating Supriya Beaston/Extender: STONE III, HOYT Weeks in Treatment: 0 Height (in): 67 Weight (lbs): 180 Body Mass Index (BMI): 28.2 Nutrition Risk Screening Items Score Screening NUTRITION RISK SCREEN: I have an illness or condition that made me change the kind and/or amount of 0 No food I eat I eat fewer than two meals per day 0 No I eat few fruits and vegetables, or milk products 0 No I have three or more drinks of beer, liquor or wine almost every day 0 No I have tooth or mouth problems that make it hard for me to eat 0 No I don't always have enough money to buy the food I need 0 No I eat alone most of the time 0 No I take three or more different prescribed or over-the-counter drugs a day 0 No Without wanting to, I have lost or gained 10 pounds in the last six months 0 No I am not always physically able to shop, cook and/or feed myself 0 No Nutrition Protocols Good Risk Protocol 0 No interventions needed Moderate Risk Protocol High Risk Proctocol Risk Level: Good Risk Score: 0 Electronic Signature(s) Signed: 07/10/2019 4:03:57 PM By: Army Melia Entered By: Army Melia on 07/10/2019 14:47:37

## 2019-07-10 NOTE — Progress Notes (Addendum)
Kim, [name] ([id number]) Visit Report for 07/10/2019 Allergy List Details Patient Name: [name], [name]. Date of Service: 07/10/2019 2:15 PM Medical Record Number: [id number] Patient Account Number: [id number] Date of Birth/Sex: 11-07-1940 (79 y.o. F) Treating RN: Army Melia Primary Care Daiwik Buffalo: Lemmie Evens Other Clinician: Referring Kalianne Fetting: Lemmie Evens Treating Tab Rylee/Extender: STONE III, HOYT Weeks in Treatment: 0 Allergies Active Allergies ciprofloxacin Reaction: nausea Severity: Moderate penicillin Reaction: Hives amlodipine Reaction: nausea Severity: Moderate cefaclor Reaction: nausea codine Reaction: sick Lipitor prednisone Reaction: palpitations sulfa drugs Allergy Notes Electronic Signature(s) Signed: 07/10/2019 4:03:57 PM By: Army Melia Entered By: Army Melia on 07/10/2019 14:42:00 Gehling, [name] ([id number]) -------------------------------------------------------------------------------- Arrival Information Details Patient Name: [name] Date of Service: 07/10/2019 2:15 PM Medical Record Number: [id number] Patient Account Number: [id number] Date of Birth/Sex: August 19, 1940 (79 y.o. F) Treating RN: Army Melia Primary Care Tramond Slinker: Lemmie Evens Other Clinician: Referring Jahni Paul: Lemmie Evens Treating Mizuki Hoel/Extender: Melburn Hake, HOYT Weeks in Treatment: 0 Visit Information Patient Arrived: Walker Arrival Time: 14:25 Accompanied By: self Transfer Assistance: None Patient Identification Verified: Yes Patient Has Alerts: Yes Patient Alerts: ABI 05/23/2019 (L)1.08 (R) 0.95 TBI (L) 0.92 (R) 0.87 Electronic Signature(s) Signed: 07/10/2019 3:24:32 PM By: Gretta Cool, BSN, RN, CWS, Kim RN, BSN Entered By: Gretta Cool, BSN, RN, CWS, Kim on 07/10/2019 15:24:31 [name], [name] ([id number]) -------------------------------------------------------------------------------- Clinic Level of Care Assessment Details Patient Name: [name], BUBB. Date of Service: 07/10/2019 2:15 PM Medical Record Number: [id number] Patient Account Number: [id number] Date of Birth/Sex: 05-Mar-1941 (79 y.o. F) Treating RN: Montey Hora Primary Care Rondia Higginbotham: Lemmie Evens Other Clinician: Referring Strider Vallance: Lemmie Evens Treating Jushua Waltman/Extender: Melburn Hake, HOYT Weeks in Treatment: 0 Clinic Level of Care Assessment Items TOOL 1 Quantity Score []  - Use when EandM and Procedure is performed on INITIAL visit 0 ASSESSMENTS - Nursing Assessment / Reassessment X - General Physical Exam (combine w/ comprehensive assessment (listed just below) when 1 20 performed on new pt. evals) X- 1 25 Comprehensive Assessment (HX, ROS, Risk Assessments, Wounds Hx, etc.) ASSESSMENTS - Wound and Skin Assessment / Reassessment []  - Dermatologic / Skin Assessment (not related to wound area) 0 ASSESSMENTS - Ostomy and/or Continence Assessment and Care []  - Incontinence Assessment and Management 0 []  - 0 Ostomy Care Assessment and Management (repouching, etc.) PROCESS - Coordination of Care X - Simple Patient / Family Education for ongoing care 1 15 []  - 0 Complex (extensive) Patient / Family Education for ongoing care X- 1 10 Staff obtains Programmer, systems, Records, Test Results / Process Orders []  - 0 Staff telephones HHA, Nursing Homes / Clarify orders / etc []  - 0 Routine Transfer to another Facility (non-emergent condition) []  - 0 Routine Hospital Admission (non-emergent condition) X- 1 15 New Admissions / Biomedical engineer / Ordering NPWT, Apligraf, etc. []  - 0 Emergency Hospital Admission (emergent condition) PROCESS - Special Needs []  - Pediatric / Minor Patient Management 0 []  - 0 Isolation Patient Management []  - 0 Hearing / Language / Visual special needs []  - 0 Assessment of Community assistance (transportation, D/C planning, etc.) []  - 0 Additional assistance / Altered mentation []  - 0 Support Surface(s) Assessment (bed, cushion,  seat, etc.) [name], Kim C. ([id number]) INTERVENTIONS - Miscellaneous []  - External ear exam 0 []  - 0 Patient Transfer (multiple staff / Civil Service fast streamer / Similar devices) []  - 0 Simple Staple / Suture removal (25 or less) []  - 0 Complex Staple / Suture removal (26 or more) []  - 0 Hypo/Hyperglycemic Management (do not check if billed  separately) []  - 0 Ankle / Brachial Index (ABI) - do not check if billed separately Has the patient been seen at the hospital within the last three years: Yes Total Score: 85 Level Of Care: New/Established - Level 3 Electronic Signature(s) Signed: 07/10/2019 4:56:10 PM By: Montey Hora Entered By: Montey Hora on 07/10/2019 15:25:18 [name], [name] ([id number]) -------------------------------------------------------------------------------- Encounter Discharge Information Details Patient Name: [name]. Date of Service: 07/10/2019 2:15 PM Medical Record Number: [id number] Patient Account Number: [id number] Date of Birth/Sex: 1940/12/28 (79 y.o. F) Treating RN: Montey Hora Primary Care Ovida Delagarza: Lemmie Evens Other Clinician: Referring Vani Gunner: Lemmie Evens Treating Rael Yo/Extender: Melburn Hake, HOYT Weeks in Treatment: 0 Encounter Discharge Information Items Post Procedure Vitals Discharge Condition: Stable Temperature (F): 98.6 Ambulatory Status: Ambulatory Pulse (bpm): 119 Discharge Destination: Home Respiratory Rate (breaths/min): 16 Transportation: Private Auto Blood Pressure (mmHg): 175/87 Accompanied By: self Schedule Follow-up Appointment: Yes Clinical Summary of Care: Electronic Signature(s) Signed: 07/10/2019 4:56:10 PM By: Montey Hora Entered By: Montey Hora on 07/10/2019 15:35:00 [name], [name] ([id number]) -------------------------------------------------------------------------------- Lower Extremity Assessment Details Patient Name: [name]. Date of Service: 07/10/2019 2:15 PM Medical Record Number:  [id number] Patient Account Number: [id number] Date of Birth/Sex: [birth year] (79 y.o. F) Treating RN: Army Melia Primary Care Macguire Holsinger: Lemmie Evens Other Clinician: Referring Braxley Balandran: Lemmie Evens Treating Ala Kratz/Extender: STONE III, HOYT Weeks in Treatment: 0 Edema Assessment Assessed: [Left: No] [Right: No] Edema: [Left: No] [Right: No] Calf Left: Right: Point of Measurement: 30 cm From Medial Instep 38 cm 36 cm Ankle Left: Right: Point of Measurement: 10 cm From Medial Instep 25 cm 24 cm Vascular Assessment Pulses: Dorsalis Pedis Palpable: [Left:Yes] [Right:Yes] Electronic Signature(s) Signed: 07/10/2019 4:03:57 PM By: Army Melia Entered By: Army Melia on 07/10/2019 14:37:42 [name], [name] ([id number]) -------------------------------------------------------------------------------- Multi Wound Chart Details Patient Name: [name]. Date of Service: 07/10/2019 2:15 PM Medical Record Number: [id number] Patient Account Number: [id number] Date of Birth/Sex: April 26, 1941 (79 y.o. F) Treating RN: Montey Hora Primary Care Brandyn Thien: Lemmie Evens Other Clinician: Referring Fatih Stalvey: Lemmie Evens Treating Keelan Tripodi/Extender: STONE III, HOYT Weeks in Treatment: 0 Vital Signs Height(in): 67 Pulse(bpm): 119 Weight(lbs): 180 Blood Pressure(mmHg): 175/87 Body Mass Index(BMI): 28 Temperature(F): 98.6 Respiratory Rate 16 (breaths/min): Photos: [N/A:N/A] Wound Location: Left Lower Leg - Lateral Right Lower Leg - Lateral N/A Wounding Event: Trauma Trauma N/A Primary Etiology: Trauma, Other Trauma, Other N/A Date Acquired: 06/18/2019 06/26/2019 N/A Weeks of Treatment: 0 0 N/A Wound Status: Open Open N/A Measurements L x W x D 5x3.1x0.5 3x4x0.1 N/A (cm) Area (cm) : 12.174 9.425 N/A Volume (cm) : 6.087 0.942 N/A Starting Position 1 10 (o'clock): Ending Position 1 5 (o'clock): Maximum Distance 1 (cm): 2 Undermining: Yes No N/A Classification: Full  Thickness Without Full Thickness Without N/A Exposed Support Structures Exposed Support Structures Exudate Amount: Medium Medium N/A Exudate Type: Serosanguineous Serosanguineous N/A Exudate Color: red, brown red, brown N/A Wound Margin: Flat and Intact Fibrotic scar, thickened scar N/A Granulation Amount: Small (1-33%) Small (1-33%) N/A Granulation Quality: Red Pink N/A Necrotic Amount: Large (67-100%) Large (67-100%) N/A Necrotic Tissue: Adherent Slough Eschar, Adherent Slough N/A Exposed Structures: Fat Layer (Subcutaneous Fat Layer (Subcutaneous N/A Tissue) Exposed: Yes Tissue) Exposed: Yes Fascia: No Fascia: No Mothershead, Genia C. ([id number]) Tendon: No Tendon: No Muscle: No Muscle: No Joint: No Joint: No Bone: No Bone: No Epithelialization: None None N/A Treatment Notes Electronic Signature(s) Signed: 07/10/2019 4:56:10 PM By: Montey Hora Entered By: Montey Hora on 07/10/2019 15:21:34 [name], [name] ([id number]) --------------------------------------------------------------------------------  Multi-Disciplinary Care Plan Details Patient Name: [name], NOWAK. Date of Service: 07/10/2019 2:15 PM Medical Record Number: [id number] Patient Account Number: [id number] Date of Birth/Sex: April 26, 1941 (79 y.o. F) Treating RN: Montey Hora Primary Care Coulson Wehner: Lemmie Evens Other Clinician: Referring Corderro Koloski: Lemmie Evens Treating Karri Kallenbach/Extender: Melburn Hake, HOYT Weeks in Treatment: 0 Active Inactive Abuse / Safety / Falls / Self Care Management Nursing Diagnoses: Potential for falls Goals: Patient will not experience any injury related to falls Date Initiated: 07/10/2019 Target Resolution Date: 10/13/2019 Goal Status: Active Interventions: Assess fall risk on admission and as needed Notes: Necrotic Tissue Nursing Diagnoses: Impaired tissue integrity related to necrotic/devitalized tissue Goals: Necrotic/devitalized tissue will be minimized in the wound  bed Date Initiated: 07/10/2019 Target Resolution Date: 10/13/2019 Goal Status: Active Interventions: Provide education on necrotic tissue and debridement process Notes: Orientation to the Wound Care Program Nursing Diagnoses: Knowledge deficit related to the wound healing center program Goals: Patient/caregiver will verbalize understanding of the [place] Program Date Initiated: 07/10/2019 Target Resolution Date: 10/13/2019 Goal Status: Active Interventions: Provide education on orientation to the wound center [name], Kim C. ([id number]) Notes: Wound/Skin Impairment Nursing Diagnoses: Impaired tissue integrity Goals: Ulcer/skin breakdown will heal within 14 weeks Date Initiated: 07/10/2019 Target Resolution Date: 10/13/2019 Goal Status: Active Interventions: Assess patient/caregiver ability to obtain necessary supplies Assess patient/caregiver ability to perform ulcer/skin care regimen upon admission and as needed Assess ulceration(s) every visit Notes: Electronic Signature(s) Signed: 07/10/2019 4:56:10 PM By: Montey Hora Entered By: Montey Hora on 07/10/2019 15:21:10 [name], [name] ([id number]) -------------------------------------------------------------------------------- Pain Assessment Details Patient Name: [name]. Date of Service: 07/10/2019 2:15 PM Medical Record Number: [id number] Patient Account Number: [id number] Date of Birth/Sex: 1941-01-21 (79 y.o. F) Treating RN: Army Melia Primary Care Bernadette Gores: Lemmie Evens Other Clinician: Referring Sharmane Dame: Lemmie Evens Treating Jerrelle Michelsen/Extender: Melburn Hake, HOYT Weeks in Treatment: 0 Active Problems Location of Pain Severity and Description of Pain Patient Has Paino No Site Locations Pain Management and Medication Current Pain Management: Electronic Signature(s) Signed: 07/10/2019 4:03:57 PM By: Army Melia Entered By: Army Melia on 07/10/2019 14:26:27 Cannell, [name]  ([id number]) -------------------------------------------------------------------------------- Patient/Caregiver Education Details Patient Name: [name]. Date of Service: 07/10/2019 2:15 PM Medical Record Number: [id number] Patient Account Number: [id number] Date of Birth/Gender: December 23, 1940 (79 y.o. F) Treating RN: Montey Hora Primary Care Physician: Lemmie Evens Other Clinician: Referring Physician: Lemmie Evens Treating Physician/Extender: Melburn Hake, HOYT Weeks in Treatment: 0 Education Assessment Education Provided To: Patient and Caregiver Education Topics Provided Venous: Handouts: Other: need for edema management Methods: Explain/Verbal Responses: State content correctly Wound/Skin Impairment: Handouts: Other: wound care as prdered Methods: Demonstration, Explain/Verbal Responses: State content correctly Electronic Signature(s) Signed: 07/10/2019 4:56:10 PM By: Montey Hora Entered By: Montey Hora on 07/10/2019 15:25:51 [name], [name] ([id number]) -------------------------------------------------------------------------------- Wound Assessment Details Patient Name: [name]. Date of Service: 07/10/2019 2:15 PM Medical Record Number: [id number] Patient Account Number: [id number] Date of Birth/Sex: 09-28-1940 (79 y.o. F) Treating RN: Army Melia Primary Care Ashelynn Marks: Lemmie Evens Other Clinician: Referring Aritha Huckeba: Lemmie Evens Treating Tymere Depuy/Extender: STONE III, HOYT Weeks in Treatment: 0 Wound Status Wound Number: 1 Primary Trauma, Other Etiology: Wound Location: Left Lower Leg - Lateral Wound Open Wounding Event: Trauma Status: Date Acquired: 06/18/2019 Comorbid Cataracts, Asthma, Chronic Obstructive Weeks Of Treatment: 0 History: Pulmonary Disease (COPD), Arrhythmia, Clustered Wound: No Coronary Artery Disease, Hypertension, Hypotension, Myocardial Infarction, Peripheral Arterial Disease Photos Wound Measurements Length:  (cm) 5 % Reduction Width: (cm) 3.1 % Reduction Depth: (cm) 0.5 Epitheliali  Area: (cm) 12.174 Tunneling: Volume: (cm) 6.087 Underminin Starting Ending P Maximum in Area: 0% in Volume: 0% zation: None No g: Yes Position (o'clock): 10 osition (o'clock): 5 Distance: (cm) 2 Wound Description Full Thickness With Exposed Support Foul Odor A Classification: Structures Slough/Fibr Wound Margin: Flat and Intact Exudate Medium Amount: Exudate Type: Serosanguineous Exudate Color: red, brown fter Cleansing: No ino Yes Wound Bed Granulation Amount: Small (1-33%) Exposed Structure Granulation Quality: Red Fascia Exposed: No Zuk, Chloeanne C. ([id number]) Necrotic Amount: Large (67-100%) Fat Layer (Subcutaneous Tissue) Exposed: Yes Necrotic Quality: Adherent Slough Tendon Exposed: No Muscle Exposed: Yes Necrosis of Muscle: No Joint Exposed: No Bone Exposed: No Electronic Signature(s) Signed: 07/13/2019 1:23:06 PM By: Army Melia Signed: 07/16/2019 5:00:56 PM By: Gretta Cool, BSN, RN, CWS, Kim RN, BSN Previous Signature: 07/10/2019 4:03:57 PM Version By: Army Melia Entered By: Gretta Cool BSN, RN, CWS, Kim on 07/13/2019 11:01:07 Meir, [name] ([id number]) -------------------------------------------------------------------------------- Wound Assessment Details Patient Name: NAZIRAH, TRI C. Date of Service: 07/10/2019 2:15 PM Medical Record Number: [id number] Patient Account Number: [id number] Date of Birth/Sex: 1941/04/04 (79 y.o. F) Treating RN: Army Melia Primary Care Shamiah Kahler: Lemmie Evens Other Clinician: Referring Lurlean Kernen: Lemmie Evens Treating An Lannan/Extender: STONE III, HOYT Weeks in Treatment: 0 Wound Status Wound Number: 2 Primary Etiology: Trauma, Other Wound Location: Right Lower Leg - Lateral Wound Status: Open Wounding Event: Trauma Date Acquired: 06/26/2019 Weeks Of Treatment: 0 Clustered Wound: No Photos Wound Measurements Length: (cm) 3 Width: (cm)  4 Depth: (cm) 0.1 Area: (cm) 9.425 Volume: (cm) 0.942 % Reduction in Area: % Reduction in Volume: Epithelialization: None Tunneling: No Undermining: No Wound Description Full Thickness Without Exposed Support Foul Odo Classification: Structures Slough/F Wound Margin: Fibrotic scar, thickened scar Exudate Medium Amount: Exudate Type: Serosanguineous Exudate Color: red, brown r After Cleansing: No ibrino Yes Wound Bed Granulation Amount: Small (1-33%) Exposed Structure Granulation Quality: Pink Fascia Exposed: No Necrotic Amount: Large (67-100%) Fat Layer (Subcutaneous Tissue) Exposed: Yes Necrotic Quality: Eschar, Adherent Slough Tendon Exposed: No Muscle Exposed: No Joint Exposed: No Bone Exposed: No CENA, BRUHN ([id number]) Electronic Signature(s) Signed: 07/10/2019 4:03:57 PM By: Army Melia Entered By: Army Melia on 07/10/2019 14:36:01 Montee, [name] ([id number]) -------------------------------------------------------------------------------- Vitals Details Patient Name: [name] Date of Service: 07/10/2019 2:15 PM Medical Record Number: [id number] Patient Account Number: [id number] Date of Birth/Sex: 1940/08/16 (79 y.o. F) Treating RN: Army Melia Primary Care Lakeitha Basques: Lemmie Evens Other Clinician: Referring Shavell Nored: Lemmie Evens Treating Ann Groeneveld/Extender: STONE III, HOYT Weeks in Treatment: 0 Vital Signs Time Taken: 14:26 Temperature (F): 98.6 Height (in): 67 Pulse (bpm): 119 Source: Stated Respiratory Rate (breaths/min): 16 Weight (lbs): 180 Blood Pressure (mmHg): 175/87 Source: Stated Reference Range: 80 - 120 mg / dl Body Mass Index (BMI): 28.2 Electronic Signature(s) Signed: 07/10/2019 4:03:57 PM By: Army Melia Entered By: Army Melia on 07/10/2019 14:27:24

## 2019-07-12 ENCOUNTER — Telehealth: Payer: Self-pay | Admitting: Cardiovascular Disease

## 2019-07-12 MED ORDER — ISOSORBIDE MONONITRATE ER 60 MG PO TB24: ORAL_TABLET | ORAL | 0 refills | Status: DC

## 2019-07-12 NOTE — Telephone Encounter (Signed)
Pharmacy needs updated Rx with new instructions due to med increase   *STAT* If patient is at the pharmacy, call can be transferred to refill team.   1. Which medications need to be refilled? (please list name of each medication and dose if known) isosorbide 60mg  bid  2. Which pharmacy/location (including street and city if local pharmacy) is medication to be sent to? [facility]  3. Do they need a 30 day or 90 day supply? [place]

## 2019-07-12 NOTE — Progress Notes (Addendum)
MARDY, HOPPE ([id number]) Visit Report for 07/10/2019 Chief Complaint Document Details Patient Name: [name], [name]. Date of Service: 07/10/2019 2:15 PM Medical Record Number: [id number] Patient Account Number: [id number] Date of Birth/Sex: Jan 14, 1941 (78 y.o. F) Treating RN: Montey Hora Primary Care Provider: Lemmie Evens Other Clinician: Referring Provider: Lemmie Evens Treating Provider/Extender: Melburn Hake, Phyliss Hulick Weeks in Treatment: 0 Information Obtained from: Patient Chief Complaint Bilateral LE ulcers Electronic Signature(s) Signed: 07/10/2019 3:16:46 PM By: Worthy Keeler PA-C Entered By: Worthy Keeler on 07/10/2019 15:16:45 [name] ([id number]) -------------------------------------------------------------------------------- Debridement Details Patient Name: [name] Date of Service: 07/10/2019 2:15 PM Medical Record Number: [id number] Patient Account Number: [id number] Date of Birth/Sex: 1940-08-22 (78 y.o. F) Treating RN: Montey Hora Primary Care Provider: Lemmie Evens Other Clinician: Referring Provider: Lemmie Evens Treating Provider/Extender: Melburn Hake, Maitland Muhlbauer Weeks in Treatment: 0 Debridement Performed for Wound #2 Right,Lateral Lower Leg Assessment: Performed By: Physician STONE III, Lydiann Bonifas E., PA-C Debridement Type: Debridement Level of Consciousness (Pre- Awake and Alert procedure): Pre-procedure Verification/Time Yes - 15:24 Out Taken: Start Time: 15:24 Pain Control: Lidocaine 4% Topical Solution Total Area Debrided (L x W): 3 (cm) x 4 (cm) = 12 (cm) Tissue and other material Viable, Non-Viable, Slough, Subcutaneous, Slough debrided: Level: Skin/Subcutaneous Tissue Debridement Description: Excisional Instrument: Curette Bleeding: Minimum Hemostasis Achieved: Pressure End Time: 15:26 Procedural Pain: 0 Post Procedural Pain: 0 Response to Treatment: Procedure was tolerated well Level of Consciousness Awake and  Alert (Post-procedure): Post Debridement Measurements of Total Wound Length: (cm) 3 Width: (cm) 4 Depth: (cm) 0.2 Volume: (cm) 1.885 Character of Wound/Ulcer Post Debridement: Improved Post Procedure Diagnosis Same as Pre-procedure Electronic Signature(s) Signed: 07/10/2019 4:56:10 PM By: Montey Hora Signed: 07/11/2019 6:44:22 PM By: Worthy Keeler PA-C Entered By: Montey Hora on 07/10/2019 15:26:49 [name], [name] ([id number]) -------------------------------------------------------------------------------- Debridement Details Patient Name: [name]. Date of Service: 07/10/2019 2:15 PM Medical Record Number: [id number] Patient Account Number: [id number] Date of Birth/Sex: Nov 11, 1940 (78 y.o. F) Treating RN: Montey Hora Primary Care Provider: Lemmie Evens Other Clinician: Referring Provider: Lemmie Evens Treating Provider/Extender: Melburn Hake, Ruhama Lehew Weeks in Treatment: 0 Debridement Performed for Wound #1 Left,Lateral Lower Leg Assessment: Performed By: Physician STONE III, Sincerity Cedar E., PA-C Debridement Type: Debridement Level of Consciousness (Pre- Awake and Alert procedure): Pre-procedure Verification/Time Yes - 15:26 Out Taken: Start Time: 15:26 Pain Control: Lidocaine 4% Topical Solution Total Area Debrided (L x W): 5 (cm) x 3.1 (cm) = 15.5 (cm) Tissue and other material Viable, Non-Viable, Slough, Subcutaneous, Slough debrided: Level: Skin/Subcutaneous Tissue Debridement Description: Excisional Instrument: Curette Bleeding: Minimum Hemostasis Achieved: Pressure End Time: 15:32 Procedural Pain: 0 Post Procedural Pain: 0 Response to Treatment: Procedure was tolerated well Level of Consciousness Awake and Alert (Post-procedure): Post Debridement Measurements of Total Wound Length: (cm) 5 Width: (cm) 3.1 Depth: (cm) 0.6 Volume: (cm) 7.304 Character of Wound/Ulcer Post Debridement: Improved Post Procedure Diagnosis Same as  Pre-procedure Electronic Signature(s) Signed: 07/10/2019 4:56:10 PM By: Montey Hora Signed: 07/11/2019 6:44:22 PM By: Worthy Keeler PA-C Entered By: Montey Hora on 07/10/2019 15:33:58 [name], [name] ([id number]) -------------------------------------------------------------------------------- HPI Details Patient Name: [name]. Date of Service: 07/10/2019 2:15 PM Medical Record Number: [id number] Patient Account Number: [id number] Date of Birth/Sex: 19-Feb-1941 (78 y.o. F) Treating RN: Montey Hora Primary Care Provider: Lemmie Evens Other Clinician: Referring Provider: Lemmie Evens Treating Provider/Extender: Melburn Hake, Daiwik Buffalo Weeks in Treatment: 0 History of Present Illness HPI Description: 07/10/2019 on evaluation today patient presents for initial inspection here in  our clinic concerning wounds that she sustained as a result of traumatic injuries to her left lower leg as well as her right lower leg. The right side is much less severe the left side actually has muscle exposed. This left side she actually had in a car door and I think it did tear into her quite a bit more than the trauma wound which was more of an abrasion on the right lower extremity. Nonetheless she is having a lot of drainage from the left. This is the one obviously that is deeper down to the muscle area. With that being said the patient also has paroxysmal atrial fibrillation, hypertension, coronary artery disease, peripheral vascular disease, and nicotine dependence. Currently she is not having a lot of severe pain although there is some discomfort. No fevers, chills, nausea, vomiting, or diarrhea. Patient did have arterial studies performed on 05/23/2019 which revealed a left ABI of 1.08 and a right ABI of 0.95. Electronic Signature(s) Signed: 07/13/2019 10:35:43 AM By: Worthy Keeler PA-C Previous Signature: 07/11/2019 6:00:41 PM Version By: Worthy Keeler PA-C Previous Signature: 07/11/2019 6:00:03 PM  Version By: Worthy Keeler PA-C Entered By: Worthy Keeler on 07/13/2019 10:35:43 [name], [name] ([id number]) -------------------------------------------------------------------------------- Physical Exam Details Patient Name: [name]. Date of Service: 07/10/2019 2:15 PM Medical Record Number: [id number] Patient Account Number: [id number] Date of Birth/Sex: Mar 15, 1941 (78 y.o. F) Treating RN: Montey Hora Primary Care Provider: Lemmie Evens Other Clinician: Referring Provider: Lemmie Evens Treating Provider/Extender: STONE III, Takelia Urieta Weeks in Treatment: 0 Constitutional patient is hypertensive.. pulse regular and within target range for patient.Marland Kitchen respirations regular, non-labored and within target range for patient.Marland Kitchen temperature within target range for patient.. Well-nourished and well-hydrated in no acute distress. Eyes conjunctiva clear no eyelid edema noted. pupils equal round and reactive to light and accommodation. Ears, Nose, Mouth, and Throat no gross abnormality of ear auricles or external auditory canals. normal hearing noted during conversation. mucus membranes moist. Respiratory normal breathing without difficulty. clear to auscultation bilaterally. Cardiovascular Patient has an irregularly irregular heart rate consistent with atrial fibrillation.. 2+ dorsalis pedis/posterior tibialis pulses. 1+ pitting edema of the bilateral lower extremities. Gastrointestinal (GI) soft, non-tender, non-distended, +BS. no ventral hernia noted. Musculoskeletal normal gait and posture. no significant deformity or arthritic changes, no loss or range of motion, no clubbing. Psychiatric this patient is able to make decisions and demonstrates good insight into disease process. Alert and Oriented x 3. pleasant and cooperative. Notes Upon inspection today patient's wound on the right really did not require much in the way of intervention which is good news. However on the left  side I did have to perform much more in the way of sharp debridement to clear away some of the necrotic tissue at this point and the patient tolerated this without significant pain. I did debride down to the muscle but not removing muscle. That is on the left. On the right side I mainly remove slough and necrotic debris from the surface of the wound this was a much more superficial debridement. Electronic Signature(s) Signed: 07/11/2019 6:02:28 PM By: Worthy Keeler PA-C Entered By: Worthy Keeler on 07/11/2019 18:02:27 [name], Kim ([id number]) -------------------------------------------------------------------------------- Physician Orders Details Patient Name: SHARANYA, TEMPLIN Date of Service: 07/10/2019 2:15 PM Medical Record Number: [id number] Patient Account Number: [id number] Date of Birth/Sex: 1941-02-25 (78 y.o. F) Treating RN: Montey Hora Primary Care Provider: Lemmie Evens Other Clinician: Referring Provider: Lemmie Evens Treating Provider/Extender: STONE III, Breawna Montenegro Weeks in Treatment:  0 Verbal / Phone Orders: No Diagnosis Coding ICD-10 Coding Code Description S81.802A Unspecified open wound, left lower leg, initial encounter S81.801A Unspecified open wound, right lower leg, initial encounter L97.822 Non-pressure chronic ulcer of other part of left lower leg with fat layer exposed L97.812 Non-pressure chronic ulcer of other part of right lower leg with fat layer exposed I48.0 Paroxysmal atrial fibrillation I10 Essential (primary) hypertension I25.10 Atherosclerotic heart disease of native coronary artery without angina pectoris I73.89 Other specified peripheral vascular diseases F17.218 Nicotine dependence, cigarettes, with other nicotine-induced disorders Wound Cleansing Wound #1 Left,Lateral Lower Leg o Cleanse wound with mild soap and water o May shower with protection. - Please do not get your wraps wet Wound #2 Right,Lateral Lower Leg o Cleanse wound  with mild soap and water o May shower with protection. - Please do not get your wraps wet Primary Wound Dressing Wound #1 Left,Lateral Lower Leg o Silver Alginate Wound #2 Right,Lateral Lower Leg o Silver Alginate Secondary Dressing Wound #1 Left,Lateral Lower Leg o ABD pad Wound #2 Right,Lateral Lower Leg o ABD pad Dressing Change Frequency Wound #1 Left,Lateral Lower Leg o Change dressing every week Wound #2 Right,Lateral Lower Leg o Change dressing every week CAELA, HUOT. ([id number]) Follow-up Appointments Wound #1 Left,Lateral Lower Leg o Return Appointment in 1 week. o Nurse Visit as needed Wound #2 Right,Lateral Lower Leg o Return Appointment in 1 week. o Nurse Visit as needed Edema Control Wound #1 Left,Lateral Lower Leg o 3 Layer Compression System - Bilateral Wound #2 Right,Lateral Lower Leg o 3 Layer Compression System - Bilateral Electronic Signature(s) Signed: 07/10/2019 4:56:10 PM By: Montey Hora Signed: 07/11/2019 6:44:22 PM By: Worthy Keeler PA-C Entered By: Montey Hora on 07/10/2019 15:33:28 Rhyner, [name] ([id number]) -------------------------------------------------------------------------------- Problem List Details Patient Name: SHAYLAH, MCGHIE C. Date of Service: 07/10/2019 2:15 PM Medical Record Number: [id number] Patient Account Number: [id number] Date of Birth/Sex: [birth year] (78 y.o. F) Treating RN: Montey Hora Primary Care Provider: Lemmie Evens Other Clinician: Referring Provider: Lemmie Evens Treating Provider/Extender: Melburn Hake, Jackob Crookston Weeks in Treatment: 0 Active Problems ICD-10 Evaluated Encounter Code Description Active Date Today Diagnosis S81.802A Unspecified open wound, left lower leg, initial encounter 07/10/2019 No Yes S81.801A Unspecified open wound, right lower leg, initial encounter 07/10/2019 No Yes L97.825 Non-pressure chronic ulcer of other part of left lower leg with 07/10/2019 No  Yes muscle involvement without evidence of necrosis L97.812 Non-pressure chronic ulcer of other part of right lower leg 07/10/2019 No Yes with fat layer exposed I48.0 Paroxysmal atrial fibrillation 07/10/2019 No Yes I10 Essential (primary) hypertension 07/10/2019 No Yes I25.10 Atherosclerotic heart disease of native coronary artery 07/10/2019 No Yes without angina pectoris I73.89 Other specified peripheral vascular diseases 07/10/2019 No Yes F17.218 Nicotine dependence, cigarettes, with other nicotine-induced 07/10/2019 No Yes disorders ELEANOR, GATLIFF ([id number]) Inactive Problems Resolved Problems Electronic Signature(s) Signed: 07/11/2019 6:05:36 PM By: Worthy Keeler PA-C Previous Signature: 07/10/2019 3:15:57 PM Version By: Worthy Keeler PA-C Entered By: Worthy Keeler on 07/11/2019 18:05:36 Neuwirth, [name] ([id number]) -------------------------------------------------------------------------------- Progress Note Details Patient Name: [name] Date of Service: 07/10/2019 2:15 PM Medical Record Number: [id number] Patient Account Number: [id number] Date of Birth/Sex: 01-16-1941 (78 y.o. F) Treating RN: Montey Hora Primary Care Provider: Lemmie Evens Other Clinician: Referring Provider: Lemmie Evens Treating Provider/Extender: Melburn Hake, Blondie Riggsbee Weeks in Treatment: 0 Subjective Chief Complaint Information obtained from Patient Bilateral LE ulcers History of Present Illness (HPI) 07/10/2019 on evaluation today patient presents for initial inspection here in  our clinic concerning wounds that she sustained as a result of traumatic injuries to her left lower leg as well as her right lower leg. The right side is much less severe the left side actually has muscle exposed. This left side she actually had in a car door and I think it did tear into her quite a bit more than the trauma wound which was more of an abrasion on the right lower extremity. Nonetheless she is having a lot of  drainage from the left. This is the one obviously that is deeper down to the muscle area. With that being said the patient also has paroxysmal atrial fibrillation, hypertension, coronary artery disease, peripheral vascular disease, and nicotine dependence. Currently she is not having a lot of severe pain although there is some discomfort. No fevers, chills, nausea, vomiting, or diarrhea. Patient did have arterial studies performed on 05/23/2019 which revealed a left ABI of 1.08 and a right ABI of 0.95. Patient History Information obtained from Patient. Allergies ciprofloxacin (Severity: Moderate, Reaction: nausea), penicillin (Reaction: Hives), amlodipine (Severity: Moderate, Reaction: nausea), cefaclor (Reaction: nausea), codine (Reaction: sick), Lipitor, prednisone (Reaction: palpitations), sulfa drugs Family History Cancer - Siblings, Diabetes - Mother, Heart Disease - Father, Hypertension - Mother,Siblings, Stroke - Mother, Thyroid Problems - Siblings, No family history of Hereditary Spherocytosis, Kidney Disease, Lung Disease, Seizures, Tuberculosis. Social History Former smoker - 13 years, Marital Status - Married, Alcohol Use - Rarely, Drug Use - No History, Caffeine Use - Never. Medical History Eyes Patient has history of Cataracts - bilateral removal Ear/Nose/Mouth/Throat Denies history of Chronic sinus problems/congestion, Middle ear problems Hematologic/Lymphatic Denies history of Anemia, Hemophilia, Human Immunodeficiency Virus, Lymphedema, Sickle Cell Disease Respiratory Patient has history of Asthma, Chronic Obstructive Pulmonary Disease (COPD) Denies history of Aspiration, Pneumothorax, Sleep Apnea, Tuberculosis Cardiovascular Patient has history of Arrhythmia - A. Fib, Coronary Artery Disease, Hypertension, Hypotension, Myocardial Infarction - 2003, Peripheral Arterial Disease Denies history of Angina, Congestive Heart Failure, Deep Vein Thrombosis, Peripheral Venous  Disease, Phlebitis, Vasculitis Gastrointestinal Case, Rexanna C. ([id number]) Denies history of Cirrhosis , Colitis, Crohn s, Hepatitis A, Hepatitis B, Hepatitis C Endocrine Denies history of Type I Diabetes, Type II Diabetes Genitourinary Denies history of End Stage Renal Disease Immunological Denies history of Lupus Erythematosus, Raynaud s, Scleroderma Integumentary (Skin) Denies history of History of Burn, History of pressure wounds Musculoskeletal Denies history of Gout, Rheumatoid Arthritis, Osteoarthritis, Osteomyelitis Neurologic Denies history of Dementia, Neuropathy, Quadriplegia, Paraplegia, Seizure Disorder Oncologic Denies history of Received Chemotherapy, Received Radiation Psychiatric Denies history of Anorexia/bulimia, Confinement Anxiety Review of Systems (ROS) Constitutional Symptoms (General Health) Denies complaints or symptoms of Fatigue, Fever, Chills, Marked Weight Change. Eyes Complains or has symptoms of Glasses / Contacts - glasses. Ear/Nose/Mouth/Throat Denies complaints or symptoms of Difficult clearing ears, Sinusitis. Hematologic/Lymphatic Denies complaints or symptoms of Bleeding / Clotting Disorders, Human Immunodeficiency Virus. Respiratory Denies complaints or symptoms of Chronic or frequent coughs, Shortness of Breath. Cardiovascular Denies complaints or symptoms of Chest pain, LE edema. Gastrointestinal Denies complaints or symptoms of Frequent diarrhea, Nausea, Vomiting. Endocrine Denies complaints or symptoms of Hepatitis, Thyroid disease, Polydypsia (Excessive Thirst). Genitourinary Denies complaints or symptoms of Kidney failure/ Dialysis, Incontinence/dribbling. Immunological Denies complaints or symptoms of Hives, Itching. Integumentary (Skin) Complains or has symptoms of Wounds. Denies complaints or symptoms of Bleeding or bruising tendency, Breakdown, Swelling. Musculoskeletal Denies complaints or symptoms of Muscle Pain, Muscle  Weakness. Neurologic Denies complaints or symptoms of Numbness/parasthesias, Focal/Weakness. Psychiatric Denies complaints or symptoms of Anxiety, Claustrophobia. Objective  [name], Kim ([id number]) Constitutional patient is hypertensive.. pulse regular and within target range for patient.Marland Kitchen respirations regular, non-labored and within target range for patient.Marland Kitchen temperature within target range for patient.. Well-nourished and well-hydrated in no acute distress. Vitals Time Taken: 2:26 PM, Height: 67 in, Source: Stated, Weight: 180 lbs, Source: Stated, BMI: 28.2, Temperature: 98.6 F, Pulse: 119 bpm, Respiratory Rate: 16 breaths/min, Blood Pressure: 175/87 mmHg. Eyes conjunctiva clear no eyelid edema noted. pupils equal round and reactive to light and accommodation. Ears, Nose, Mouth, and Throat no gross abnormality of ear auricles or external auditory canals. normal hearing noted during conversation. mucus membranes moist. Respiratory normal breathing without difficulty. clear to auscultation bilaterally. Cardiovascular Patient has an irregularly irregular heart rate consistent with atrial fibrillation.. 2+ dorsalis pedis/posterior tibialis pulses. 1+ pitting edema of the bilateral lower extremities. Gastrointestinal (GI) soft, non-tender, non-distended, +BS. no ventral hernia noted. Musculoskeletal normal gait and posture. no significant deformity or arthritic changes, no loss or range of motion, no clubbing. Psychiatric this patient is able to make decisions and demonstrates good insight into disease process. Alert and Oriented x 3. pleasant and cooperative. General Notes: Upon inspection today patient's wound on the right really did not require much in the way of intervention which is good news. However on the left side I did have to perform much more in the way of sharp debridement to clear away some of the necrotic tissue at this point and the patient tolerated this without  significant pain. I did debride down to the muscle but not removing muscle. That is on the left. On the right side I mainly remove slough and necrotic debris from the surface of the wound this was a much more superficial debridement. Integumentary (Hair, Skin) Wound #1 status is Open. Original cause of wound was Trauma. The wound is located on the Left,Lateral Lower Leg. The wound measures 5cm length x 3.1cm width x 0.5cm depth; 12.174cm^2 area and 6.087cm^3 volume. There is Fat Layer (Subcutaneous Tissue) Exposed exposed. There is no tunneling noted, however, there is undermining starting at 10:00 and ending at 5:00 with a maximum distance of 2cm. There is a medium amount of serosanguineous drainage noted. The wound margin is flat and intact. There is small (1-33%) red granulation within the wound bed. There is a large (67-100%) amount of necrotic tissue within the wound bed including Adherent Slough. Wound #2 status is Open. Original cause of wound was Trauma. The wound is located on the Right,Lateral Lower Leg. The wound measures 3cm length x 4cm width x 0.1cm depth; 9.425cm^2 area and 0.942cm^3 volume. There is Fat Layer (Subcutaneous Tissue) Exposed exposed. There is no tunneling or undermining noted. There is a medium amount of serosanguineous drainage noted. The wound margin is fibrotic, thickened scar. There is small (1-33%) pink granulation within the wound bed. There is a large (67-100%) amount of necrotic tissue within the wound bed including Eschar and Adherent Slough. 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 There was a Excisional Skin/Subcutaneous Tissue Debridement with a total area of 15.5 sq cm performed by STONE III, Cheyne Bungert E., PA-C. With the following instrument(s): Curette to remove Viable and Non-Viable tissue/material. Material removed includes Subcutaneous Tissue and Slough and after achieving pain control using Lidocaine 4% Topical Solution. No specimens were taken. A time out was conducted at 15:26, prior to the start of the procedure. A Minimum amount of bleeding was controlled with Pressure. The procedure was tolerated well with a pain level of 0 throughout and a pain level of 0 following the procedure. Post Debridement Measurements: 5cm length x 3.1cm width x 0.6cm depth; 7.304cm^3 volume. Character of Wound/Ulcer Post Debridement is improved. Post procedure Diagnosis Wound #1: Same as Pre-Procedure Wound #2 Pre-procedure diagnosis of Wound #2 is a Trauma, Other located on the Right,Lateral Lower Leg . There was a Excisional Skin/Subcutaneous Tissue Debridement with a total area of 12 sq cm performed by STONE III, Audry Kauzlarich E., PA-C. With the following instrument(s): Curette to remove Viable and Non-Viable tissue/material. Material removed includes Subcutaneous Tissue and Slough and after achieving pain control using Lidocaine 4% Topical Solution. No specimens were taken. A time out was conducted at 15:24, prior to the start of the procedure. A Minimum amount of bleeding was controlled with Pressure. The procedure was tolerated well with a pain level of 0 throughout and a pain level of 0 following the procedure. Post Debridement Measurements: 3cm length x 4cm  width x 0.2cm depth; 1.885cm^3 volume. Character of Wound/Ulcer Post Debridement is improved. Post procedure Diagnosis Wound #2: Same as Pre-Procedure Plan Wound Cleansing: Wound #1 Left,Lateral Lower Leg: Cleanse wound with mild soap and water May shower with protection. - Please do not get your wraps wet Wound #2 Right,Lateral Lower Leg: Cleanse wound with mild soap and water GIANNI, FUCHS ([id number]) May shower with protection. - Please do not get your wraps wet Primary Wound Dressing: Wound #1 Left,Lateral Lower Leg: Silver Alginate Wound #2 Right,Lateral Lower Leg: Silver Alginate Secondary Dressing: Wound #1 Left,Lateral Lower Leg: ABD pad Wound #2 Right,Lateral Lower Leg: ABD pad Dressing Change Frequency: Wound #1 Left,Lateral Lower Leg: Change dressing every week Wound #2 Right,Lateral Lower Leg: Change dressing every week Follow-up Appointments: Wound #1 Left,Lateral Lower Leg: Return Appointment in 1 week. Nurse Visit as needed Wound #2 Right,Lateral Lower Leg: Return Appointment in 1 week. Nurse Visit as needed Edema Control: Wound #1 Left,Lateral Lower Leg: 3 Layer Compression System - Bilateral Wound #2 Right,Lateral Lower Leg: 3 Layer Compression System - Bilateral 1. At this time my suggestion is good to be that we go ahead and initiate treatment with a silver alginate dressing to both wound locations which I think is good to be the best option for her currently. On the left side this is going to need to be packed in order to fill in the empty space and allow this area to heal appropriately. It is going to take some time. 2. We will cover the areas with an ABD pad and then subsequently I am get a recommend a 3 layer compression wrap bilaterally to help with edema control which 3. With regard to the patient's elevation in blood pressure and heart rate I did recheck her heart rate and it appears that she does have atrial fibrillation it was significantly  lower on recheck compared to previous. I think that the atrial fibrillation may also be messing with the actual blood pressure numbers nonetheless we will keep  an eye on this and recheck next week as well. We will see patient back for reevaluation in 1 week here in the clinic. If anything worsens or changes patient will contact our office for additional recommendations. Electronic Signature(s) Signed: 07/13/2019 10:35:56 AM By: Worthy Keeler PA-C Previous Signature: 07/11/2019 6:05:52 PM Version By: Worthy Keeler PA-C Previous Signature: 07/11/2019 6:03:56 PM Version By: Worthy Keeler PA-C Entered By: Worthy Keeler on 07/13/2019 10:35:56 Neider, [name] ([id number]) -------------------------------------------------------------------------------- ROS/PFSH Details Patient Name: [name] Date of Service: 07/10/2019 2:15 PM Medical Record Number: [id number] Patient Account Number: [id number] Date of Birth/Sex: 1941/05/31 (78 y.o. F) Treating RN: Army Melia Primary Care Provider: Lemmie Evens Other Clinician: Referring Provider: Lemmie Evens Treating Provider/Extender: Melburn Hake, Makenlee Mckeag Weeks in Treatment: 0 Information Obtained From Patient Constitutional Symptoms (General Health) Complaints and Symptoms: Negative for: Fatigue; Fever; Chills; Marked Weight Change Eyes Complaints and Symptoms: Positive for: Glasses / Contacts - glasses Medical History: Positive for: Cataracts - bilateral removal Ear/Nose/Mouth/Throat Complaints and Symptoms: Negative for: Difficult clearing ears; Sinusitis Medical History: Negative for: Chronic sinus problems/congestion; Middle ear problems Hematologic/Lymphatic Complaints and Symptoms: Negative for: Bleeding / Clotting Disorders; Human Immunodeficiency Virus Medical History: Negative for: Anemia; Hemophilia; Human Immunodeficiency Virus; Lymphedema; Sickle Cell Disease Respiratory Complaints and Symptoms: Negative for: Chronic or  frequent coughs; Shortness of Breath Medical History: Positive for: Asthma; Chronic Obstructive Pulmonary Disease (COPD) Negative for: Aspiration; Pneumothorax; Sleep Apnea; Tuberculosis Cardiovascular Complaints and Symptoms: Negative for: Chest pain; LE edema Medical History: Positive for: Arrhythmia - A. Fib; Coronary Artery Disease; Hypertension; Hypotension; Myocardial Infarction - 2003; Peripheral Arterial Disease Negative for: Angina; Congestive Heart Failure; Deep Vein Thrombosis; Peripheral Venous Disease; Phlebitis; Vasculitis Haverland, Denetria C. ([id number]) Gastrointestinal Complaints and Symptoms: Negative for: Frequent diarrhea; Nausea; Vomiting Medical History: Negative for: Cirrhosis ; Colitis; Crohnos; Hepatitis A; Hepatitis B; Hepatitis C Endocrine Complaints and Symptoms: Negative for: Hepatitis; Thyroid disease; Polydypsia (Excessive Thirst) Medical History: Negative for: Type I Diabetes; Type II Diabetes Genitourinary Complaints and Symptoms: Negative for: Kidney failure/ Dialysis; Incontinence/dribbling Medical History: Negative for: End Stage Renal Disease Immunological Complaints and Symptoms: Negative for: Hives; Itching Medical History: Negative for: Lupus Erythematosus; Raynaudos; Scleroderma Integumentary (Skin) Complaints and Symptoms: Positive for: Wounds Negative for: Bleeding or bruising tendency; Breakdown; Swelling Medical History: Negative for: History of Burn; History of pressure wounds Musculoskeletal Complaints and Symptoms: Negative for: Muscle Pain; Muscle Weakness Medical History: Negative for: Gout; Rheumatoid Arthritis; Osteoarthritis; Osteomyelitis Neurologic Complaints and Symptoms: Negative for: Numbness/parasthesias; Focal/Weakness Medical History: Negative for: Dementia; Neuropathy; Quadriplegia; Paraplegia; Seizure Disorder Psychiatric RENESMAE, DONAHEY ([id number]) Complaints and Symptoms: Negative for: Anxiety;  Claustrophobia Medical History: Negative for: Anorexia/bulimia; Confinement Anxiety Oncologic Medical History: Negative for: Received Chemotherapy; Received Radiation HBO Extended History Items Eyes: Cataracts Immunizations Pneumococcal Vaccine: Received Pneumococcal Vaccination: Yes Implantable Devices None Family and Social History Cancer: Yes - Siblings; Diabetes: Yes - Mother; Heart Disease: Yes - Father; Hereditary Spherocytosis: No; Hypertension: Yes - Mother,Siblings; Kidney Disease: No; Lung Disease: No; Seizures: No; Stroke: Yes - Mother; Thyroid Problems: Yes - Siblings; Tuberculosis: No; Former smoker - 61 years; Marital Status - Married; Alcohol Use: Rarely; Drug Use: No History; Caffeine Use: Never; Financial Concerns: No; Food, Clothing or Shelter Needs: No; Support System Lacking: No; Transportation Concerns: No Electronic Signature(s) Signed: 07/10/2019 4:03:57 PM By: Army Melia Signed: 07/11/2019 6:44:22 PM By: Worthy Keeler PA-C Entered By: Army Melia on 07/10/2019 14:46:46 Bade, [name] ([id number]) -------------------------------------------------------------------------------- SuperBill Details Patient Name: BOGGESS,  Emer C. Date of Service: 07/10/2019 Medical Record Number: [id number] Patient Account Number: [id number] Date of Birth/Sex: 06-05-1941 (79 y.o. 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 layer exposed I48.0 Paroxysmal  atrial fibrillation I10 Essential (primary) hypertension I25.10 Atherosclerotic heart disease of native coronary artery without angina pectoris I73.89 Other specified peripheral vascular diseases F17.218 Nicotine dependence, cigarettes, with other nicotine-induced disorders Facility Procedures CPT4: Description Modifier Quantity Code 73403709 99213 - WOUND CARE VISIT-LEV 3 EST PT 1 CPT4: 64383818 11042 - DEB SUBQ TISSUE 20 SQ CM/< 1 ICD-10 Diagnosis Description 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 L97.812 Non-pressure chronic ulcer of other part of  right lower leg with fat layer exposed CPT4: 40375436 11045 - DEB SUBQ TISS EA ADDL 20CM 1 ICD-10 Diagnosis Description 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 L97.812 Non-pressure chronic ulcer of other part of  right lower leg with fat layer exposed Physician Procedures CPT4: Description Modifier Quantity Code 0677034 03524 - WC PHYS LEVEL 4 - NEW PT 25 1 ICD-10 Diagnosis Description S81.802A Unspecified open wound, left lower leg, initial encounter S81.801A Unspecified open wound, right lower leg, initial encounter  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

## 2019-07-13 ENCOUNTER — Other Ambulatory Visit: Payer: Self-pay

## 2019-07-13 DIAGNOSIS — S81802A Unspecified open wound, left lower leg, initial encounter: Secondary | ICD-10-CM | POA: Diagnosis not present

## 2019-07-16 NOTE — Progress Notes (Signed)
SAGAN, WURZEL ([id number]) Visit Report for 07/13/2019 Arrival Information Details Patient Name: [name], [name]. Date of Service: 07/13/2019 10:45 AM Medical Record Number: [id number] Patient Account Number: [id number] Date of Birth/Sex: 04-03-1941 (79 y.o. F) Treating RN: Cornell Barman Primary Care Sona Nations: Lemmie Evens Other Clinician: Referring Skyeler Scalese: Lemmie Evens Treating Trayonna Bachmeier/Extender: Melburn Hake, HOYT Weeks in Treatment: 0 Visit Information History Since Last Visit Added or deleted any medications: No Patient Arrived: Walker Any new allergies or adverse reactions: No Arrival Time: 11:01 Had a fall or experienced change in No Accompanied By: self activities of daily living that may affect Transfer Assistance: None risk of falls: Patient Identification Verified: Yes Signs or symptoms of abuse/neglect since last visito No Secondary Verification Process Yes Hospitalized since last visit: No Completed: Implantable device outside of the clinic excluding No Patient Has Alerts: Yes cellular tissue based products placed in the center Patient Alerts: ABI 05/23/2019 since last visit: (L)1.08 (R) 0.95 Has Dressing in Place as Prescribed: Yes TBI (L) 0.92 (R) Pain Present Now: No 0.87 Electronic Signature(s) Signed: 07/16/2019 5:00:56 PM By: Gretta Cool, BSN, RN, CWS, Kim RN, BSN Entered By: Gretta Cool, BSN, RN, CWS, Kim on 07/13/2019 11:02:01 [name] ([id number]) -------------------------------------------------------------------------------- Compression Therapy Details Patient Name: [name], Kim C. Date of Service: 07/13/2019 10:45 AM Medical Record Number: [id number] Patient Account Number: [id number] Date of Birth/Sex: 1941-04-09 (79 y.o. F) Treating RN: Cornell Barman Primary Care Favour [place]: Lemmie Evens Other Clinician: Referring Verne Cove: Lemmie Evens Treating Talynn Lebon/Extender: Melburn Hake, HOYT Weeks in Treatment: 0 Compression Therapy Performed for Wound  Assessment: Wound #1 Left,Lateral Lower Leg Performed By: Clinician Cornell Barman, RN Compression Type: Three Layer Pre Treatment ABI: 1.1 Electronic Signature(s) Signed: 07/16/2019 5:00:56 PM By: Gretta Cool, BSN, RN, CWS, Kim RN, BSN Entered By: Gretta Cool, BSN, RN, CWS, Kim on 07/13/2019 11:06:41 [name] ([id number]) -------------------------------------------------------------------------------- Compression Therapy Details Patient Name: [name], [name]. Date of Service: 07/13/2019 10:45 AM Medical Record Number: [id number] Patient Account Number: [id number] Date of Birth/Sex: 01-10-1941 (79 y.o. F) Treating RN: Cornell Barman Primary Care Jeff Frieden: Lemmie Evens Other Clinician: Referring Javion Holmer: Lemmie Evens Treating Takuya Lariccia/Extender: Melburn Hake, HOYT Weeks in Treatment: 0 Compression Therapy Performed for Wound Assessment: Wound #2 Right,Lateral Lower Leg Performed By: Clinician Cornell Barman, RN Compression Type: Three Layer Pre Treatment ABI: 1 Electronic Signature(s) Signed: 07/16/2019 5:00:56 PM By: Gretta Cool, BSN, RN, CWS, Kim RN, BSN Entered By: Gretta Cool, BSN, RN, CWS, Kim on 07/13/2019 11:07:12 [name], [name] ([id number]) -------------------------------------------------------------------------------- Encounter Discharge Information Details Patient Name: [name]. Date of Service: 07/13/2019 10:45 AM Medical Record Number: [id number] Patient Account Number: [id number] Date of Birth/Sex: [birth year] (79 y.o. F) Treating RN: Cornell Barman Primary Care Ayomide Zuleta: Lemmie Evens Other Clinician: Referring Laurin Paulo: Lemmie Evens Treating Britian Jentz/Extender: Melburn Hake, HOYT Weeks in Treatment: 0 Encounter Discharge Information Items Discharge Condition: Stable Ambulatory Status: Walker Discharge Destination: Home Transportation: Private Auto Accompanied By: self Schedule Follow-up Appointment: Yes Clinical Summary of Care: Electronic Signature(s) Signed: 07/16/2019 5:00:56 PM By:  Gretta Cool, BSN, RN, CWS, Kim RN, BSN Entered By: Gretta Cool, BSN, RN, CWS, Kim on 07/13/2019 11:18:13 [name], [name] ([id number]) -------------------------------------------------------------------------------- Wound Assessment Details Patient Name: [name], [name] Date of Service: 07/13/2019 10:45 AM Medical Record Number: [id number] Patient Account Number: [id number] Date of Birth/Sex: 05-07-1941 (79 y.o. F) Treating RN: Cornell Barman Primary Care Darina Hartwell: Lemmie Evens Other Clinician: Referring Hien Cunliffe: Lemmie Evens Treating Lataisha Colan/Extender: STONE III, HOYT Weeks in Treatment: 0 Wound Status Wound Number: 1 Primary Trauma, Other Etiology: Wound Location: Left Lower  Leg - Lateral Wound Open Wounding Event: Trauma Status: Date Acquired: 06/18/2019 Comorbid Cataracts, Asthma, Chronic Obstructive Weeks Of Treatment: 0 History: Pulmonary Disease (COPD), Arrhythmia, Clustered Wound: No Coronary Artery Disease, Hypertension, Hypotension, Myocardial Infarction, Peripheral Arterial Disease Photos Wound Measurements Length: (cm) 4.5 % Reduction Width: (cm) 3 % Reduction Depth: (cm) 0.8 Epitheliali Area: (cm) 10.603 Tunneling: Volume: (cm) 8.482 Underminin Starting Ending P Maximum in Area: 12.9% in Volume: -39.3% zation: None No g: Yes Position (o'clock): 12 osition (o'clock): 12 Distance: (cm) 3.5 Wound Description Full Thickness With Exposed Support Foul Odor A Classification: Structures Slough/Fibr Wound Margin: Flat and Intact Exudate Large Amount: Exudate Type: Serous Exudate Color: amber fter Cleansing: No ino Yes Wound Bed Granulation Amount: Small (1-33%) Exposed Structure Granulation Quality: Red Fascia Exposed: No [name], Kim C. ([id number]) Necrotic Amount: Large (67-100%) Fat Layer (Subcutaneous Tissue) Exposed: Yes Necrotic Quality: Adherent Slough Tendon Exposed: No Muscle Exposed: Yes Necrosis of Muscle: No Joint Exposed: No Bone Exposed:  No Treatment Notes Wound #1 (Left, Lateral Lower Leg) Notes silvercel, abd, 3 layer with unna to anchor Electronic Signature(s) Signed: 07/16/2019 5:00:56 PM By: Gretta Cool, BSN, RN, CWS, Kim RN, BSN Entered By: Gretta Cool, BSN, RN, CWS, Kim on 07/13/2019 11:05:06 [name], [name] ([id number]) -------------------------------------------------------------------------------- Wound Assessment Details Patient Name: [name], Kim C. Date of Service: 07/13/2019 10:45 AM Medical Record Number: [id number] Patient Account Number: [id number] Date of Birth/Sex: 08-03-1940 (79 y.o. F) Treating RN: Cornell Barman Primary Care Danicka Hourihan: Lemmie Evens Other Clinician: Referring Donella Pascarella: Lemmie Evens Treating Md Smola/Extender: Melburn Hake, HOYT Weeks in Treatment: 0 Wound Status Wound Number: 2 Primary Trauma, Other Etiology: Wound Location: Right Lower Leg - Lateral Wound Open Wounding Event: Trauma Status: Date Acquired: 06/26/2019 Comorbid Cataracts, Asthma, Chronic Obstructive Weeks Of Treatment: 0 History: Pulmonary Disease (COPD), Arrhythmia, Clustered Wound: No Coronary Artery Disease, Hypertension, Hypotension, Myocardial Infarction, Peripheral Arterial Disease Photos Wound Measurements Length: (cm) 2.4 Width: (cm) 3.8 Depth: (cm) 0.1 Area: (cm) 7.163 Volume: (cm) 0.716 % Reduction in Area: 24% % Reduction in Volume: 24% Epithelialization: Small (1-33%) Tunneling: No Undermining: No Wound Description Full Thickness Without Exposed Support Foul Odo Classification: Structures Slough/F Wound Margin: Fibrotic scar, thickened scar Exudate Medium Amount: Exudate Type: Serous Exudate Color: amber r After Cleansing: No ibrino Yes Wound Bed Granulation Amount: Medium (34-66%) Exposed Structure Granulation Quality: Pink Fascia Exposed: No Necrotic Amount: Medium (34-66%) Fat Layer (Subcutaneous Tissue) Exposed: Yes Necrotic Quality: Eschar, Adherent Slough Tendon Exposed:  No Muscle Exposed: No Joint Exposed: No [place], Dotsie C. ([id number]) Bone Exposed: No Treatment Notes Wound #2 (Right, Lateral Lower Leg) Notes silvercel, abd, 3 layer with unna to anchor Electronic Signature(s) Signed: 07/16/2019 5:00:56 PM By: Gretta Cool, BSN, RN, CWS, Kim RN, BSN Entered By: Gretta Cool, BSN, RN, CWS, Kim on 07/13/2019 11:05:56

## 2019-07-17 ENCOUNTER — Other Ambulatory Visit: Payer: Self-pay

## 2019-07-17 ENCOUNTER — Encounter: Payer: Medicare HMO | Admitting: Physician Assistant

## 2019-07-17 DIAGNOSIS — S81802A Unspecified open wound, left lower leg, initial encounter: Secondary | ICD-10-CM | POA: Diagnosis not present

## 2019-07-17 NOTE — Progress Notes (Addendum)
[name], [name] ([id number]) Visit Report for 07/17/2019 Chief Complaint Document Details Patient Name: [name], [name]. Date of Service: 07/17/2019 3:15 PM Medical Record Number: [id number] Patient Account Number: [id number] Date of Birth/Sex: January 20, 1941 (79 y.o. F) Treating RN: Montey Hora Primary Care Provider: Lemmie Evens Other Clinician: Referring Provider: Lemmie Evens Treating Provider/Extender: Melburn Hake, Lannie Yusuf Weeks in Treatment: 1 Information Obtained from: Patient Chief Complaint Bilateral LE ulcers Electronic Signature(s) Signed: 07/17/2019 3:04:58 PM By: Worthy Keeler PA-C Entered By: Worthy Keeler on 07/17/2019 15:04:58 [name], [name] ([id number]) -------------------------------------------------------------------------------- HPI Details Patient Name: [name], [name] Date of Service: 07/17/2019 3:15 PM Medical Record Number: [id number] Patient Account Number: [id number] Date of Birth/Sex: Sep 16, 1940 (79 y.o. F) Treating RN: Montey Hora Primary Care Provider: Lemmie Evens Other Clinician: Referring Provider: Lemmie Evens Treating Provider/Extender: Melburn Hake, Ilena Dieckman Weeks in Treatment: 1 History of Present Illness HPI Description: 07/10/2019 on evaluation today patient presents for initial inspection here in our clinic concerning wounds that she sustained as a result of traumatic injuries to her left lower leg as well as her right lower leg. The right side is much less severe the left side actually has muscle exposed. This left side she actually had in a car door and I think it did tear into her quite a bit more than the trauma wound which was more of an abrasion on the right lower extremity. Nonetheless she is having a lot of drainage from the left. This is the one obviously that is deeper down to the muscle area. With that being said the patient also has paroxysmal atrial fibrillation, hypertension, coronary artery disease, peripheral vascular disease,  and nicotine dependence. Currently she is not having a lot of severe pain although there is some discomfort. No fevers, chills, nausea, vomiting, or diarrhea. Patient did have arterial studies performed on 05/23/2019 which revealed a left ABI of 1.08 and a right ABI of 0.95. 07/17/2019 on evaluation today patient appears to be doing quite well with regard to her wounds. She has been showing signs of actually dramatic improvement in just 1 week in my opinion. In fact even the area where the muscle is exposed is showing already good signs of new granulation tissue over top of the muscle which is exactly what we want to see. Overall I am very pleased at this point. Electronic Signature(s) Signed: 07/17/2019 3:52:14 PM By: Worthy Keeler PA-C Entered By: Worthy Keeler on 07/17/2019 15:52:14 [name], [name] ([id number]) -------------------------------------------------------------------------------- Physical Exam Details Patient Name: [name], [name]. Date of Service: 07/17/2019 3:15 PM Medical Record Number: [id number] Patient Account Number: [id number] Date of Birth/Sex: 1940/09/12 (79 y.o. F) Treating RN: Montey Hora Primary Care Provider: Lemmie Evens Other Clinician: Referring Provider: Lemmie Evens Treating Provider/Extender: STONE III, Colbe Viviano Weeks in Treatment: 1 Constitutional Well-nourished and well-hydrated in no acute distress. Respiratory normal breathing without difficulty. Psychiatric this patient is able to make decisions and demonstrates good insight into disease process. Alert and Oriented x 3. pleasant and cooperative. Notes Patient's wounds currently did not require any sharp debridement and she seems to be doing quite well. I am very pleased with the progress for just 1 week's time. I do believe she can probably keep the wrap on for 1 week at a time she did not seem to drain to such a degree that this would be any problem for her. She seems to be having a lot of  good granulation tissue forming at the base of the wound which is great  news. Electronic Signature(s) Signed: 07/17/2019 3:52:52 PM By: Worthy Keeler PA-C Entered By: Worthy Keeler on 07/17/2019 15:52:52 [name], [name] ([id number]) -------------------------------------------------------------------------------- Physician Orders Details Patient Name: [name], [name] Date of Service: 07/17/2019 3:15 PM Medical Record Number: [id number] Patient Account Number: [id number] Date of Birth/Sex: 1940-12-31 (79 y.o. 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 - Please do not get your wraps wet Wound #2 Right,Lateral Lower Leg o Cleanse wound with mild soap and water o May shower with protection. - Please do not get your wraps wet Primary Wound Dressing Wound #1 Left,Lateral Lower Leg o Silver Alginate o Collagen - to muscle exposed area Wound #2 Right,Lateral Lower Leg o Collagen Secondary Dressing Wound #1  Left,Lateral Lower Leg o ABD pad Wound #2 Right,Lateral Lower Leg o ABD pad Dressing Change Frequency Wound #1 Left,Lateral Lower Leg o Change dressing every week [name], [name] ([id number]) Wound #2 Right,Lateral Lower Leg o Change dressing every week Follow-up Appointments o Return Appointment in 1 week. Edema Control Wound #1 Left,Lateral Lower Leg o 3 Layer Compression System - Bilateral Wound #2 Right,Lateral Lower Leg o 3 Layer Compression System - Bilateral Electronic Signature(s) Signed: 07/17/2019 4:01:12 PM By: Worthy Keeler PA-C Signed: 07/17/2019 4:09:10 PM By: Montey Hora Entered By: Montey Hora on 07/17/2019 15:49:24 [name], [name] ([id number]) -------------------------------------------------------------------------------- Problem List Details Patient Name: [name], Kim C. Date of Service: 07/17/2019 3:15 PM Medical Record Number: [id number] Patient Account Number: [id number] Date of Birth/Sex: 09/07/1940 (78 y.o. F) Treating RN: Montey Hora Primary Care Provider: Lemmie Evens Other Clinician: Referring Provider: Lemmie Evens Treating Provider/Extender: Melburn Hake, Trinda Harlacher Weeks in Treatment: 1 Active Problems ICD-10 Evaluated Encounter Code Description Active Date Today Diagnosis S81.802A Unspecified open wound, left lower leg, initial encounter 07/10/2019 No Yes S81.801A Unspecified open wound, right lower leg, initial encounter 07/10/2019 No Yes L97.825 Non-pressure chronic ulcer of other part of left lower leg with 07/10/2019 No Yes muscle involvement without evidence of necrosis L97.812 Non-pressure chronic ulcer of other part of right lower leg 07/10/2019 No Yes with fat layer exposed I48.0 Paroxysmal atrial fibrillation 07/10/2019 No Yes I10 Essential (primary) hypertension 07/10/2019 No Yes I25.10 Atherosclerotic heart disease of native coronary artery 07/10/2019 No Yes without angina pectoris I73.89 Other specified peripheral vascular  diseases 07/10/2019 No Yes F17.218 Nicotine dependence, cigarettes, with other nicotine-induced 07/10/2019 No Yes disorders ANUSHKA, HARTINGER ([id number]) Inactive Problems Resolved Problems Electronic Signature(s) Signed: 07/17/2019 3:04:37 PM By: Worthy Keeler PA-C Entered By: Worthy Keeler on 07/17/2019 15:04:36 Ohaver, [name] ([id number]) -------------------------------------------------------------------------------- Progress Note Details Patient Name: [name] Date of Service: 07/17/2019 3:15 PM Medical Record Number: [id number] Patient Account Number: [id number] Date of Birth/Sex: 1940-08-22 (78 y.o. F) Treating  RN: Montey Hora Primary Care Provider: Lemmie Evens Other Clinician: Referring Provider: Lemmie Evens Treating Provider/Extender: Melburn Hake, Luisana Lutzke Weeks in Treatment: 1 Subjective Chief Complaint Information obtained from Patient Bilateral LE ulcers History of Present Illness (HPI) 07/10/2019 on evaluation today patient presents for initial inspection here in our clinic concerning wounds that she sustained as a result of traumatic injuries to her left lower leg as well as her right lower leg. The right side is much less severe the left side actually has muscle exposed. This left side she actually had in a car door and I think it did tear into her quite a bit more than the trauma wound which was more of an abrasion on the right lower extremity. Nonetheless she is having a lot of drainage from the left. This is the one obviously that is deeper down to the muscle area. With that being said the patient also has paroxysmal atrial fibrillation, hypertension, coronary artery disease, peripheral vascular disease, and nicotine dependence. Currently she is not having a lot of severe pain although there is some discomfort. No fevers, chills, nausea, vomiting, or diarrhea. Patient did have arterial studies performed on 05/23/2019 which revealed a left ABI of 1.08 and a  right ABI of 0.95. 07/17/2019 on evaluation today patient appears to be doing quite well with regard to her wounds. She has been showing signs of actually dramatic improvement in just 1 week in my opinion. In fact even the area where the muscle is exposed is showing already good signs of new granulation tissue over top of the muscle which is exactly what we want to see. Overall I am very pleased at this point. Objective Constitutional Well-nourished and well-hydrated in no acute distress. Vitals Time Taken: 3:12 PM, Height: 67 in, Weight: 180 lbs, BMI: 28.2, Temperature: 98.4 F, Pulse: 60 bpm, Respiratory Rate: 16 breaths/min, Blood Pressure: 156/60 mmHg. Respiratory normal breathing without difficulty. Psychiatric this patient is able to make decisions and demonstrates good insight into disease process. Alert and Oriented x 3. pleasant and cooperative. General Notes: Patient's wounds currently did not require any sharp debridement and she seems to be doing quite well. I am very pleased with the progress for just 1 week's time. I do believe she can probably keep the wrap on for 1 week at a time she [name], Kim C. ([id number]) did not seem to drain to such a degree that this would be any problem for her. She seems to be having a lot of good granulation tissue forming at the base of the wound which is great news. Integumentary (Hair, Skin) Wound #1 status is Open. Original cause of wound was Trauma. The wound is located on the Left,Lateral Lower Leg. The wound measures 4.1cm length x 3.1cm width x 0.6cm depth; 9.982cm^2 area and 5.989cm^3 volume. There is muscle and Fat Layer (Subcutaneous Tissue) Exposed exposed. There is no tunneling noted, however, there is undermining starting at 12:00 and ending at 12:00 with a maximum distance of 0.9cm. There is a large amount of serous drainage noted. The wound margin is flat and intact. There is small (1-33%) red granulation within the wound bed.  There is a large (67-100%) amount of necrotic tissue within the wound bed including Adherent Slough. Wound #2 status is Open. Original cause of wound was Trauma. The wound is located on the Right,Lateral Lower Leg. The wound measures 1.8cm length x 3.4cm width x 0.1cm depth; 4.807cm^2 area and 0.481cm^3 volume. There is Fat Layer (Subcutaneous Tissue) Exposed exposed. There  is no tunneling or undermining noted. There is a medium amount of serous drainage noted. The wound margin is fibrotic, thickened scar. There is medium (34-66%) pink granulation within the wound bed. There is a medium (34-66%) amount of necrotic tissue within the wound bed including Eschar and Adherent Slough. 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 There was a Three Layer Compression Therapy Procedure by Montey Hora, RN. Post procedure Diagnosis Wound #1: Same as Pre-Procedure Wound #2 Pre-procedure diagnosis of Wound #2 is a Trauma, Other located on the Right,Lateral Lower Leg . There was a Three Layer Compression Therapy Procedure by Montey Hora, RN. Post procedure Diagnosis Wound #2: Same as Pre-Procedure [name], [name] ([id number]) Plan Wound Cleansing: Wound #1 Left,Lateral Lower Leg: Cleanse wound with mild soap and water May shower  with protection. - Please do not get your wraps wet Wound #2 Right,Lateral Lower Leg: Cleanse wound with mild soap and water May shower with protection. - Please do not get your wraps wet Primary Wound Dressing: Wound #1 Left,Lateral Lower Leg: Silver Alginate Collagen - to muscle exposed area Wound #2 Right,Lateral Lower Leg: Collagen Secondary Dressing: Wound #1 Left,Lateral Lower Leg: ABD pad Wound #2 Right,Lateral Lower Leg: ABD pad Dressing Change Frequency: Wound #1 Left,Lateral Lower Leg: Change dressing every week Wound #2 Right,Lateral Lower Leg: Change dressing every week Follow-up Appointments: Return Appointment in 1 week. Edema Control: Wound #1 Left,Lateral Lower Leg: 3 Layer Compression System - Bilateral Wound #2 Right,Lateral Lower Leg: 3 Layer Compression System - Bilateral 1. My suggestion currently is good to be that we actually switch to collagen dressing for the right lower extremity and subsequently that we put some collagen underneath the undermining areas and over the muscle on the right lower extremity. 2. I am also going to suggest that we go ahead and continue with the silver alginate over top the left lower extremity ulcer to catch any additional drainage due to the deeper nature of this wound. 3. I would recommend that we continue with the bilateral 3 layer compression wrap although on the right over the area of the patient's bunion we do need to pad this up some with some additional foam in order to help with pain and pressure relief here she states that last week the wrap really bothered her there. We will see patient back for reevaluation in 1 week here in the clinic. If anything worsens or changes patient will contact our office for additional recommendations. Electronic Signature(s) Signed: 07/17/2019 3:53:54 PM By: Worthy Keeler PA-C Entered By: Worthy Keeler on 07/17/2019 15:53:53 [name], [name]  ([id number]) -------------------------------------------------------------------------------- SuperBill Details Patient Name: [name] Date of Service: 07/17/2019 Medical Record Number: [id number] Patient Account Number: [id number] Date of Birth/Sex: 06-Nov-1940 (79 y.o. 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

## 2019-07-17 NOTE — Progress Notes (Addendum)
[name], [name] ([id number]) Visit Report for 07/17/2019 Arrival Information Details Patient Name: [name], [name]. Date of Service: 07/17/2019 3:15 PM Medical Record Number: [id number] Patient Account Number: [id number] Date of Birth/Sex: 11/30/1940 (79 y.o. F) Treating RN: Montey Hora Primary Care Amandy Chubbuck: Lemmie Evens Other Clinician: Referring Marykatherine Sherwood: Lemmie Evens Treating Tessah Patchen/Extender: Melburn Hake, HOYT Weeks in Treatment: 1 Visit Information History Since Last Visit Added or deleted any medications: No Patient Arrived: Walker Any new allergies or adverse reactions: No Arrival Time: 15:11 Had a fall or experienced change in No Accompanied By: self activities of daily living that may affect Transfer Assistance: None risk of falls: Patient Identification Verified: Yes Signs or symptoms of abuse/neglect since last visito No Secondary Verification Process Yes Hospitalized since last visit: No Completed: Implantable device outside of the clinic excluding No Patient Has Alerts: Yes cellular tissue based products placed in the center Patient Alerts: ABI 05/23/2019 since last visit: (L)1.08 (R) 0.95 Has Dressing in Place as Prescribed: Yes TBI (L) 0.92 (R) Has Compression in Place as Prescribed: Yes 0.87 Pain Present Now: No Electronic Signature(s) Signed: 07/17/2019 4:10:34 PM By: Lorine Bears RCP, RRT, CHT Entered By: Lorine Bears on 07/17/2019 15:14:25 [name], [name] ([id number]) -------------------------------------------------------------------------------- Compression Therapy Details Patient Name: [name] Date of Service: 07/17/2019 3:15 PM Medical Record Number: [id number] Patient Account Number: [id number] Date of Birth/Sex: [birth year] (79 y.o. F) Treating RN: Montey Hora Primary Care Millie Forde: Lemmie Evens Other Clinician: Referring Scheryl Sanborn: Lemmie Evens Treating Chyla Schlender/Extender: STONE III,  HOYT Weeks in Treatment: 1 Compression Therapy Performed for Wound Assessment: Wound #1 Left,Lateral Lower Leg Performed By: Clinician Montey Hora, RN Compression Type: Three Layer Post Procedure Diagnosis Same as Pre-procedure Electronic Signature(s) Signed: 07/17/2019 4:09:10 PM By: Montey Hora Entered By: Montey Hora on 07/17/2019 15:48:26 [name], [name] ([id number]) -------------------------------------------------------------------------------- Compression Therapy Details Patient Name: [name], MCGRADY. Date of Service: 07/17/2019 3:15 PM Medical Record Number: [id number] Patient Account Number: [id number] Date of Birth/Sex: Jun 02, 1941 (79 y.o. F) Treating RN: Montey Hora Primary Care Nataley Bahri: Lemmie Evens Other Clinician: Referring Shon Mansouri: Lemmie Evens Treating Durk Carmen/Extender: Melburn Hake, HOYT Weeks in Treatment: 1 Compression Therapy Performed for Wound Assessment: Wound #2 Right,Lateral Lower Leg Performed By: Clinician Montey Hora, RN Compression Type: Three Layer Post Procedure Diagnosis Same as Pre-procedure Electronic Signature(s) Signed: 07/17/2019 4:09:10 PM By: Montey Hora Entered By: Montey Hora on 07/17/2019 15:48:26 [name], [name] ([id number]) -------------------------------------------------------------------------------- Encounter Discharge Information Details Patient Name: [name], BETTON. Date of Service: 07/17/2019 3:15 PM Medical Record Number: [id number] Patient Account Number: [id number] Date of Birth/Sex: February 12, 1941 (79 y.o. F) Treating RN: Montey Hora Primary Care Tameeka Luo: Lemmie Evens Other Clinician: Referring Kale Rondeau: Lemmie Evens Treating Victorious Kundinger/Extender: Melburn Hake, HOYT Weeks in Treatment: 1 Encounter Discharge Information Items Discharge Condition: Stable Ambulatory Status: Ambulatory Discharge Destination: Home Transportation: Private Auto Accompanied By: self Schedule Follow-up Appointment:  Yes Clinical Summary of Care: Electronic Signature(s) Signed: 07/17/2019 4:09:10 PM By: Montey Hora Entered By: Montey Hora on 07/17/2019 15:52:21 [name], [name] ([id number]) -------------------------------------------------------------------------------- Lower Extremity Assessment Details Patient Name: [name]. Date of Service: 07/17/2019 3:15 PM Medical Record Number: [id number] Patient Account Number: [id number] Date of Birth/Sex: 02-10-1941 (79 y.o. F) Treating RN: Army Melia Primary Care Deklan Minar: Lemmie Evens Other Clinician: Referring Anny Sayler: Lemmie Evens Treating Jontae Sonier/Extender: STONE III, HOYT Weeks in Treatment: 1 Edema Assessment Assessed: [Left: No] [Right: No] Edema: [Left: No] [Right: No] Calf Left: Right: Point of Measurement: 30 cm From Medial Instep 36 cm 33.5 cm Ankle  Left: Right: Point of Measurement: 10 cm From Medial Instep 21 cm 21 cm Vascular Assessment Pulses: Dorsalis Pedis Palpable: [Left:Yes] [Right:Yes] Electronic Signature(s) Signed: 07/17/2019 4:02:35 PM By: Army Melia Entered By: Army Melia on 07/17/2019 15:28:33 [name], [name] ([id number]) -------------------------------------------------------------------------------- Multi Wound Chart Details Patient Name: [name]. Date of Service: 07/17/2019 3:15 PM Medical Record Number: [id number] Patient Account Number: [id number] Date of Birth/Sex: Nov 06, 1940 (79 y.o. F) Treating RN: Montey Hora Primary Care Climmie Cronce: Lemmie Evens Other Clinician: Referring Peyson Postema: Lemmie Evens Treating Nylia Gavina/Extender: STONE III, HOYT Weeks in Treatment: 1 Vital Signs Height(in): 63 Pulse(bpm): 60 Weight(lbs): 180 Blood Pressure(mmHg): 156/60 Body Mass Index(BMI): 28 Temperature(F): 98.4 Respiratory Rate 16 (breaths/min): Photos: [N/A:N/A] Wound Location: Left Lower Leg - Lateral Right Lower Leg - Lateral N/A Wounding Event: Trauma Trauma N/A Primary  Etiology: Trauma, Other Trauma, Other N/A Comorbid History: Cataracts, Asthma, Chronic Cataracts, Asthma, Chronic N/A Obstructive Pulmonary Obstructive Pulmonary Disease (COPD), Arrhythmia, Disease (COPD), Arrhythmia, Coronary Artery Disease, Coronary Artery Disease, Hypertension, Hypotension, Hypertension, Hypotension, Myocardial Infarction, Myocardial Infarction, Peripheral Arterial Disease Peripheral Arterial Disease Date Acquired: 06/18/2019 06/26/2019 N/A Weeks of Treatment: 1 1 N/A Wound Status: Open Open N/A Measurements L x W x D 4.1x3.1x0.6 1.8x3.4x0.1 N/A (cm) Area (cm) : 9.982 4.807 N/A Volume (cm) : 5.989 0.481 N/A % Reduction in Area: 18.00% 49.00% N/A % Reduction in Volume: 1.60% 48.90% N/A Starting Position 1 12 (o'clock): Ending Position 1 12 (o'clock): Maximum Distance 1 (cm): 0.9 Undermining: Yes No N/A Classification: Full Thickness With Exposed Full Thickness Without N/A Support Structures Exposed Support Structures Exudate Amount: Large Medium N/A Exudate Type: Serous Serous N/A [name], [name] ([id number]) Exudate Color: amber amber N/A Wound Margin: Flat and Intact Fibrotic scar, thickened scar N/A Granulation Amount: Small (1-33%) Medium (34-66%) N/A Granulation Quality: Red Pink N/A Necrotic Amount: Large (67-100%) Medium (34-66%) N/A Necrotic Tissue: Adherent Slough Eschar, Adherent Slough N/A Exposed Structures: Fat Layer (Subcutaneous Fat Layer (Subcutaneous N/A Tissue) Exposed: Yes Tissue) Exposed: Yes Muscle: Yes Fascia: No Fascia: No Tendon: No Tendon: No Muscle: No Joint: No Joint: No Bone: No Bone: No Epithelialization: None Small (1-33%) N/A Treatment Notes Electronic Signature(s) Signed: 07/17/2019 4:09:10 PM By: Montey Hora Entered By: Montey Hora on 07/17/2019 15:44:36 Mccreery, [name] ([id number]) -------------------------------------------------------------------------------- Butler  Details Patient Name: [name], RICKLEFS. Date of Service: 07/17/2019 3:15 PM Medical Record Number: [id number] Patient Account Number: [id number] Date of Birth/Sex: December 11, 1940 (78 y.o. F) Treating RN: Montey Hora Primary Care Dekisha Mesmer: Lemmie Evens Other Clinician: Referring Breyana Follansbee: Lemmie Evens Treating Albena Comes/Extender: Melburn Hake, HOYT Weeks in Treatment: 1 Active Inactive Abuse / Safety / Falls / Self Care Management Nursing Diagnoses: Potential for falls Goals: Patient will not experience any injury related to falls Date Initiated: 07/10/2019 Target Resolution Date: 10/13/2019 Goal Status: Active Interventions: Assess fall risk on admission and as needed Notes: Necrotic Tissue Nursing Diagnoses: Impaired tissue integrity related to necrotic/devitalized tissue Goals: Necrotic/devitalized tissue will be minimized in the wound bed Date Initiated: 07/10/2019 Target Resolution Date: 10/13/2019 Goal Status: Active Interventions: Provide education on necrotic tissue and debridement process Notes: Orientation to the Wound Care Program Nursing Diagnoses: Knowledge deficit related to the wound healing center program Goals: Patient/caregiver will verbalize understanding of the [facility] Date Initiated: 07/10/2019 Target Resolution Date: 10/13/2019 Goal Status: Active Interventions: Provide education on orientation to the wound center [name], Kim C. ([id number]) Notes: Wound/Skin Impairment Nursing Diagnoses: Impaired tissue integrity Goals: Ulcer/skin breakdown will heal within 14 weeks Date Initiated:  07/10/2019 Target Resolution Date: 10/13/2019 Goal Status: Active Interventions: Assess patient/caregiver ability to obtain necessary supplies Assess patient/caregiver ability to perform ulcer/skin care regimen upon admission and as needed Assess ulceration(s) every visit Notes: Electronic Signature(s) Signed: 07/17/2019 4:09:10 PM By: Montey Hora Entered By: Montey Hora on 07/17/2019 15:44:21 [name], [name] ([id number]) -------------------------------------------------------------------------------- Pain Assessment Details Patient Name: [name]. Date of Service: 07/17/2019 3:15 PM Medical Record Number: [id number] Patient Account Number: [id number] Date of Birth/Sex: Nov 06, 1940 (79 y.o. F) Treating RN: Montey Hora Primary Care Oliviah Agostini: Lemmie Evens Other Clinician: Referring Jenisa Monty: Lemmie Evens Treating Kendell Gammon/Extender: Melburn Hake, HOYT Weeks in Treatment: 1 Active Problems Location of Pain Severity and Description of Pain Patient Has Paino No Site Locations Pain Management and Medication Current Pain Management: Electronic Signature(s) Signed: 07/17/2019 4:09:10 PM By: Montey Hora Signed: 07/17/2019 4:10:34 PM By: Lorine Bears RCP, RRT, CHT Entered By: Lorine Bears on 07/17/2019 15:14:33 [name], [name] ([id number]) -------------------------------------------------------------------------------- Patient/Caregiver Education Details Patient Name: [name], KAFER. Date of Service: 07/17/2019 3:15 PM Medical Record Number: [id number] Patient Account Number: [id number] Date of Birth/Gender: [birth year] (79 y.o. F) Treating RN: Montey Hora Primary Care Physician: Lemmie Evens Other Clinician: Referring Physician: Lemmie Evens Treating Physician/Extender: Sharalyn Ink in Treatment: 1 Education Assessment Education Provided To: Patient Education Topics Provided Venous: Handouts: Other: leg elevation Methods: Explain/Verbal Responses: State content correctly Electronic Signature(s) Signed: 07/17/2019 4:09:10 PM By: Montey Hora Entered By: Montey Hora on 07/17/2019 15:51:18 [name], [name] ([id number]) -------------------------------------------------------------------------------- Wound Assessment Details Patient Name: [name]. Date of Service: 07/17/2019 3:15 PM Medical Record Number: [id number] Patient Account Number: [id number] Date of Birth/Sex: 1941/03/14 (78 y.o. F) Treating RN: Army Melia Primary Care Alexsa Flaum: Lemmie Evens Other Clinician: Referring Teron Blais: Lemmie Evens Treating Babak Lucus/Extender: STONE III, HOYT Weeks in Treatment: 1 Wound Status Wound Number: 1 Primary Trauma, Other Etiology: Wound Location: Left Lower Leg - Lateral Wound Open Wounding Event: Trauma Status: Date Acquired: 06/18/2019 Comorbid Cataracts, Asthma, Chronic Obstructive Weeks Of Treatment: 1 History: Pulmonary Disease (COPD), Arrhythmia, Clustered Wound: No Coronary Artery Disease, Hypertension, Hypotension, Myocardial Infarction, Peripheral Arterial Disease Photos Wound Measurements Length: (cm) 4.1 % Reduction Width: (cm) 3.1 % Reduction Depth: (cm) 0.6 Epitheliali Area: (cm) 9.982 Tunneling: Volume: (cm) 5.989 Underminin Starting Ending P Maximum in Area: 18% in Volume: 1.6% zation: None No g: Yes Position (o'clock): 12 osition (o'clock): 12 Distance: (cm) 0.9 Wound Description Full Thickness With Exposed Support Foul Odor A Classification: Structures Slough/Fibr Wound Margin: Flat and Intact Exudate Large Amount: Exudate Type: Serous Exudate Color: amber fter Cleansing: No ino Yes Wound Bed Granulation Amount: Small (1-33%) Exposed Structure Granulation Quality: Red Fascia Exposed: No Matlack, Yvonnia C. ([id number]) Necrotic Amount: Large (67-100%) Fat Layer (Subcutaneous Tissue) Exposed: Yes Necrotic Quality: Adherent Slough Tendon Exposed: No Muscle Exposed: Yes Necrosis of Muscle: No Joint Exposed: No Bone Exposed: No Treatment Notes Wound #1 (Left, Lateral Lower Leg) Notes promogran on the right and on the exposed muscel on the left with silvercel on top on the left, abd, 3 layer with unna to anchor Electronic Signature(s) Signed: 07/17/2019 4:02:35 PM By: Army Melia Signed: 07/17/2019 4:09:10 PM By: Montey Hora Entered By: Montey Hora on 07/17/2019 15:45:40 Boyd, [name] ([id number]) -------------------------------------------------------------------------------- Wound Assessment Details Patient Name: GISELLE, BRUTUS C. Date of Service: 07/17/2019 3:15 PM Medical Record Number: [id number] Patient Account Number: [id number] Date of Birth/Sex: [birth year] (78 y.o. F) Treating RN: Army Melia Primary Care Erum Cercone: Lemmie Evens Other Clinician: Referring Kal Chait:  Lemmie Evens Treating Crockett Rallo/Extender: STONE III, HOYT Weeks in Treatment: 1 Wound Status Wound Number: 2 Primary Trauma, Other Etiology: Wound Location: Right Lower Leg - Lateral Wound Open Wounding Event: Trauma Status: Date Acquired: 06/26/2019 Comorbid Cataracts, Asthma, Chronic Obstructive Weeks Of Treatment: 1 History: Pulmonary Disease (COPD), Arrhythmia, Clustered Wound: No Coronary Artery Disease, Hypertension, Hypotension, Myocardial Infarction, Peripheral Arterial Disease Photos Wound Measurements Length: (cm) 1.8 Width: (cm) 3.4 Depth: (cm) 0.1 Area: (cm) 4.807 Volume: (cm) 0.481 % Reduction in Area: 49% % Reduction in Volume: 48.9% Epithelialization: Small (1-33%) Tunneling: No Undermining: No Wound Description Full Thickness Without Exposed Support Foul Odo Classification: Structures Slough/F Wound Margin: Fibrotic scar, thickened scar Exudate Medium Amount: Exudate Type: Serous Exudate Color: amber r After Cleansing: No ibrino Yes Wound Bed Granulation Amount: Medium (34-66%) Exposed Structure Granulation Quality: Pink Fascia Exposed: No Necrotic Amount: Medium (34-66%) Fat Layer (Subcutaneous Tissue) Exposed: Yes Necrotic Quality: Eschar, Adherent Slough Tendon Exposed: No Muscle Exposed: No Joint Exposed: No Neumann, Jode C. ([id number]) Bone Exposed: No Treatment Notes Wound #2 (Right, Lateral Lower  Leg) Notes promogran on the right and on the exposed muscel on the left with silvercel on top on the left, abd, 3 layer with unna to anchor Electronic Signature(s) Signed: 07/17/2019 4:02:35 PM By: Army Melia Signed: 07/17/2019 4:09:10 PM By: Montey Hora Entered By: Montey Hora on 07/17/2019 15:45:21 Castrillon, [name] ([id number]) -------------------------------------------------------------------------------- Vitals Details Patient Name: [name]. Date of Service: 07/17/2019 3:15 PM Medical Record Number: [id number] Patient Account Number: [id number] Date of Birth/Sex: 1941/03/31 (78 y.o. F) Treating RN: Montey Hora Primary Care Jakelyn Squyres: Lemmie Evens Other Clinician: Referring Delesa Kawa: Lemmie Evens Treating Stevens Magwood/Extender: STONE III, HOYT Weeks in Treatment: 1 Vital Signs Time Taken: 15:12 Temperature (F): 98.4 Height (in): 67 Pulse (bpm): 60 Weight (lbs): 180 Respiratory Rate (breaths/min): 16 Body Mass Index (BMI): 28.2 Blood Pressure (mmHg): 156/60 Reference Range: 80 - 120 mg / dl Electronic Signature(s) Signed: 07/17/2019 4:10:34 PM By: Lorine Bears RCP, RRT, CHT Entered By: Lorine Bears on 07/17/2019 15:16:36

## 2019-07-24 ENCOUNTER — Encounter: Payer: Medicare HMO | Admitting: Physician Assistant

## 2019-07-24 ENCOUNTER — Other Ambulatory Visit: Payer: Self-pay

## 2019-07-24 DIAGNOSIS — S81802A Unspecified open wound, left lower leg, initial encounter: Secondary | ICD-10-CM | POA: Diagnosis not present

## 2019-07-24 NOTE — Progress Notes (Addendum)
Kim, [name] ([id number]) Visit Report for 07/24/2019 Chief Complaint Document Details Patient Name: [name], [name]. Date of Service: 07/24/2019 10:45 AM Medical Record Number: [id number] Patient Account Number: [id number] Date of Birth/Sex: 22-Nov-1940 (79 y.o. F) Treating RN: Cornell Barman Primary Care Provider: Lemmie Evens Other Clinician: Referring Provider: Lemmie Evens Treating Provider/Extender: Melburn Hake, HOYT Weeks in Treatment: 2 Information Obtained from: Patient Chief Complaint Bilateral LE ulcers Electronic Signature(s) Signed: 07/24/2019 11:03:37 AM By: Worthy Keeler PA-C Entered By: Worthy Keeler on 07/24/2019 11:03:37 Meth, [name] ([id number]) -------------------------------------------------------------------------------- Debridement Details Patient Name: [name] Date of Service: 07/24/2019 10:45 AM Medical Record Number: [id number] Patient Account Number: [id number] Date of Birth/Sex: [birth year] (79 y.o. F) Treating RN: Army Melia Primary Care Provider: Lemmie Evens Other Clinician: Referring Provider: Lemmie Evens Treating Provider/Extender: Melburn Hake, HOYT Weeks in Treatment: 2 Debridement Performed for Wound #2 Right,Lateral Lower Leg Assessment: Performed By: Physician STONE III, HOYT E., PA-C Debridement Type: Debridement Level of Consciousness (Pre- Awake and Alert procedure): Pre-procedure Verification/Time Yes - 11:06 Out Taken: Start Time: 11:07 Pain Control: Lidocaine Total Area Debrided (L x W): 0.8 (cm) x 3.5 (cm) = 2.8 (cm) Tissue and other material Viable, Non-Viable, Slough, Subcutaneous, Slough debrided: Level: Skin/Subcutaneous Tissue Debridement Description: Excisional Instrument: Curette Bleeding: Minimum Hemostasis Achieved: Pressure End Time: 11:08 Response to Treatment: Procedure was tolerated well Level of Consciousness Awake and Alert (Post-procedure): Post Debridement Measurements of Total  Wound Length: (cm) 0.8 Width: (cm) 3.5 Depth: (cm) 0.1 Volume: (cm) 0.22 Character of Wound/Ulcer Post Debridement: Stable Post Procedure Diagnosis Same as Pre-procedure Electronic Signature(s) Signed: 07/24/2019 1:44:57 PM By: Worthy Keeler PA-C Signed: 07/24/2019 3:23:32 PM By: Army Melia Entered By: Army Melia on 07/24/2019 11:08:49 [name] ([id number]) -------------------------------------------------------------------------------- HPI Details Patient Name: [name]. Date of Service: 07/24/2019 10:45 AM Medical Record Number: [id number] Patient Account Number: [id number] Date of Birth/Sex: May 09, 1941 (79 y.o. F) Treating RN: Cornell Barman Primary Care Provider: Lemmie Evens Other Clinician: Referring Provider: Lemmie Evens Treating Provider/Extender: Melburn Hake, HOYT Weeks in Treatment: 2 History of Present Illness HPI Description: 07/10/2019 on evaluation today patient presents for initial inspection here in our clinic concerning wounds that she sustained as a result of traumatic injuries to her left lower leg as well as her right lower leg. The right side is much less severe the left side actually has muscle exposed. This left side she actually had in a car door and I think it did tear into her quite a bit more than the trauma wound which was more of an abrasion on the right lower extremity. Nonetheless she is having a lot of drainage from the left. This is the one obviously that is deeper down to the muscle area. With that being said the patient also has paroxysmal atrial fibrillation, hypertension, coronary artery disease, peripheral vascular disease, and nicotine dependence. Currently she is not having a lot of severe pain although there is some discomfort. No fevers, chills, nausea, vomiting, or diarrhea. Patient did have arterial studies performed on 05/23/2019 which revealed a left ABI of 1.08 and a right ABI of 0.95. 07/17/2019 on evaluation today  patient appears to be doing quite well with regard to her wounds. She has been showing signs of actually dramatic improvement in just 1 week in my opinion. In fact even the area where the muscle is exposed is showing already good signs of new granulation tissue over top of the muscle which is exactly what we want to  see. Overall I am very pleased at this point. 07/24/2019 upon evaluation today patient appears to be doing well with regard to her wounds. In fact the right lower extremity ulcer appears to be almost completely healed which is great news some of the dressing material stuck to the periwound region. In regard to the left lower extremity that is showing signs of improvement at slow but nonetheless is still making improvement at this point. Electronic Signature(s) Signed: 07/24/2019 11:12:56 AM By: Worthy Keeler PA-C Entered By: Worthy Keeler on 07/24/2019 11:12:55 Pat, [name] ([id number]) -------------------------------------------------------------------------------- Physical Exam Details Patient Name: Kim, [name]. Date of Service: 07/24/2019 10:45 AM Medical Record Number: [id number] Patient Account Number: [id number] Date of Birth/Sex: 1940-07-09 (79 y.o. F) Treating RN: Cornell Barman Primary Care Provider: Lemmie Evens Other Clinician: Referring Provider: Lemmie Evens Treating Provider/Extender: STONE III, HOYT Weeks in Treatment: 2 Constitutional Well-nourished and well-hydrated in no acute distress. Respiratory normal breathing without difficulty. Psychiatric this patient is able to make decisions and demonstrates good insight into disease process. Alert and Oriented x 3. pleasant and cooperative. Notes Upon inspection patient's wound on the right did require some sharp debridement to clear away some of the minimal slough as well as dry dressing from the surface of the wound. Patient tolerated this today without complication. There was minimal bleeding noted  during the debridement process. The patient did not have any significant pain. There was no sharp debridement necessary on the left lower extremity. Electronic Signature(s) Signed: 07/24/2019 11:14:29 AM By: Worthy Keeler PA-C Entered By: Worthy Keeler on 07/24/2019 11:14:28 Jurado, [name] ([id number]) -------------------------------------------------------------------------------- Physician Orders Details Patient Name: MARLYCE, MCDOUGALD Date of Service: 07/24/2019 10:45 AM Medical Record Number: [id number] Patient Account Number: [id number] Date of Birth/Sex: [birth year] (78 y.o. 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 - Please do not get your wraps wet Wound #2 Right,Lateral Lower Leg o Cleanse wound with mild soap and water o May shower with protection. - Please do not get your wraps wet Primary Wound Dressing o Silver Alginate o Collagen - to muscle exposed area Wound #2 Right,Lateral Lower Leg o  Other: - adaptic Secondary Dressing Wound #1 Left,Lateral Lower Leg o ABD pad Wound #2 Right,Lateral Lower Leg o ABD pad Dressing Change Frequency Wound #1 Left,Lateral Lower Leg o Change dressing every week Wound #2 Right,Lateral Lower Leg o Change dressing every week KAYLINE, SHEER. ([id number]) Follow-up Appointments Wound #1 Left,Lateral Lower Leg o Return Appointment in 1 week. Wound #2 Right,Lateral Lower Leg o Return Appointment in 1 week. Edema Control Wound #1 Left,Lateral Lower Leg o 3 Layer Compression System - Bilateral Wound #2 Right,Lateral Lower Leg o 3 Layer Compression System - Bilateral Electronic  Signature(s) Signed: 07/24/2019 1:44:57 PM By: Worthy Keeler PA-C Signed: 07/24/2019 3:23:32 PM By: Army Melia Entered By: Army Melia on 07/24/2019 11:10:00 [name] ([id number]) -------------------------------------------------------------------------------- Problem List Details Patient Name: ELLI, GROESBECK. Date of Service: 07/24/2019 10:45 AM Medical Record Number: [id number] Patient Account Number: [id number] Date of Birth/Sex: 03-13-1941 (78 y.o. F) Treating RN: Cornell Barman Primary Care Provider: Lemmie Evens Other Clinician: Referring Provider: Lemmie Evens Treating Provider/Extender: Melburn Hake, HOYT Weeks in Treatment: 2 Active Problems ICD-10 Evaluated Encounter Code Description Active Date Today Diagnosis S81.802A Unspecified open wound, left lower leg, initial encounter 07/10/2019 No Yes S81.801A Unspecified open wound, right lower leg, initial encounter 07/10/2019 No Yes L97.825 Non-pressure chronic ulcer of other part of left lower leg with 07/10/2019 No Yes muscle involvement without evidence of necrosis L97.812 Non-pressure chronic ulcer of other part of right lower leg 07/10/2019 No Yes with fat layer exposed I48.0 Paroxysmal atrial fibrillation 07/10/2019 No Yes I10 Essential (primary) hypertension 07/10/2019 No  Yes I25.10 Atherosclerotic heart disease of native coronary artery 07/10/2019 No Yes without angina pectoris I73.89 Other specified peripheral vascular diseases 07/10/2019 No Yes F17.218 Nicotine dependence, cigarettes, with other nicotine-induced 07/10/2019 No Yes disorders SANTA, ABDELRAHMAN ([id number]) Inactive Problems Resolved Problems Electronic Signature(s) Signed: 07/24/2019 11:03:32 AM By: Worthy Keeler PA-C Entered By: Worthy Keeler on 07/24/2019 11:03:31 Dacquisto, [name] ([id number]) -------------------------------------------------------------------------------- Progress Note Details Patient Name: [name] Date of Service: 07/24/2019 10:45 AM Medical Record Number: [id number] Patient Account Number: [id number] Date of Birth/Sex: 16-Feb-1941 (78 y.o. F) Treating RN: Cornell Barman Primary Care Provider: Lemmie Evens Other Clinician: Referring Provider: Lemmie Evens Treating Provider/Extender: Melburn Hake, HOYT Weeks in Treatment: 2 Subjective Chief Complaint Information obtained from Patient Bilateral LE ulcers History of Present Illness (HPI) 07/10/2019 on evaluation today patient presents for initial inspection here in our clinic concerning wounds that she sustained as a result of traumatic injuries to her left lower leg as well as her right lower leg. The right side is much less severe the left side actually has muscle exposed. This left side she actually had in a car door and I think it did tear into her quite a bit more than the trauma wound which was more of an abrasion on the right lower extremity. Nonetheless she is having a lot of drainage from the left. This is the one obviously that is deeper down to the muscle area. With that being said the patient also has paroxysmal atrial fibrillation, hypertension, coronary artery disease, peripheral vascular disease, and nicotine dependence. Currently she is not having a lot of severe pain although there is some discomfort.  No fevers, chills, nausea, vomiting, or diarrhea. Patient did have arterial studies performed on 05/23/2019 which revealed a left ABI of 1.08 and a right ABI of 0.95. 07/17/2019 on evaluation today patient appears to be doing quite well with regard to her wounds. She has been showing signs of actually dramatic improvement in just 1 week in my opinion. In fact even the area where the muscle is exposed is showing already good signs of new granulation tissue over top of the muscle which is exactly what we want to see. Overall I am very pleased at this point. 07/24/2019 upon evaluation today patient appears to be doing well with regard to her wounds. In fact the right lower extremity ulcer appears to be almost completely healed which is great news some of the dressing material stuck to the periwound region. In regard to the left  lower extremity that is showing signs of improvement at slow but nonetheless is still making improvement at this point. Objective Constitutional Well-nourished and well-hydrated in no acute distress. Vitals Time Taken: 10:45 AM, Height: 67 in, Weight: 180 lbs, BMI: 28.2, Temperature: 98.4 F, Pulse: 83 bpm, Respiratory Rate: 18 breaths/min, Blood Pressure: 152/68 mmHg. Respiratory normal breathing without difficulty. Psychiatric this patient is able to make decisions and demonstrates good insight into disease process. Alert and Oriented x 3. pleasant Priore, Reanne C. ([id number]) and cooperative. General Notes: Upon inspection patient's wound on the right did require some sharp debridement to clear away some of the minimal slough as well as dry dressing from the surface of the wound. Patient tolerated this today without complication. There was minimal bleeding noted during the debridement process. The patient did not have any significant pain. There was no sharp debridement necessary on the left lower extremity. Integumentary (Hair, Skin) Wound #1 status is Open. Original  cause of wound was Trauma. The wound is located on the Left,Lateral Lower Leg. The wound measures 4cm length x 2.8cm width x 0.5cm depth; 8.796cm^2 area and 4.398cm^3 volume. There is muscle and Fat Layer (Subcutaneous Tissue) Exposed exposed. There is no tunneling noted, however, there is undermining starting at 12:00 and ending at 12:00 with a maximum distance of 1.1cm. There is a large amount of serous drainage noted. The wound margin is flat and intact. There is small (1-33%) red granulation within the wound bed. There is a large (67-100%) amount of necrotic tissue within the wound bed including Adherent Slough. Wound #2 status is Open. Original cause of wound was Trauma. The wound is located on the Right,Lateral Lower Leg. The wound measures 0.8cm length x 3.5cm width x 0.1cm depth; 2.199cm^2 area and 0.22cm^3 volume. There is Fat Layer (Subcutaneous Tissue) Exposed exposed. There is a medium amount of serous drainage noted. The wound margin is fibrotic, thickened scar. There is medium (34-66%) pink granulation within the wound bed. There is a medium (34-66%) amount of necrotic tissue within the wound bed including Eschar. 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 There was a Excisional Skin/Subcutaneous Tissue  Debridement with a total area of 2.8 sq cm performed by STONE III, HOYT E., PA-C. With the following instrument(s): Curette to remove Viable and Non-Viable tissue/material. Material removed includes Subcutaneous Tissue and Slough and after achieving pain control using Lidocaine. A time out was conducted at 11:06, prior to the start of the procedure. A Minimum amount of bleeding was controlled with Pressure. The procedure was tolerated well. Post Debridement Measurements: 0.8cm length x 3.5cm width x 0.1cm depth; 0.22cm^3 volume. Character of Wound/Ulcer Post Debridement is stable. Post procedure Diagnosis Wound #2: Same as Pre-Procedure PATTIJO, JUSTE C. ([id number]) Pre-procedure diagnosis of Wound #2 is a Trauma, Other located on the Right,Lateral Lower Leg . There was a Three Layer Compression Therapy Procedure with a pre-treatment ABI of 0.9 by Army Melia, RN. Post procedure Diagnosis Wound #2: Same as  Pre-Procedure Wound #1 Pre-procedure diagnosis of Wound #1 is a Trauma, Other located on the Left,Lateral Lower Leg . There was a Three Layer Compression Therapy Procedure with a pre-treatment ABI of 0.9 by Army Melia, RN. Post procedure Diagnosis Wound #1: Same as Pre-Procedure Plan Wound Cleansing: Wound #1 Left,Lateral Lower Leg: Cleanse wound with mild soap and water May shower with protection. - Please do not get your wraps wet Wound #2 Right,Lateral Lower Leg: Cleanse wound with mild soap and water May shower with protection. - Please do not get your wraps wet Primary Wound Dressing: Silver Alginate Collagen - to muscle exposed area Wound #2 Right,Lateral Lower Leg: Other: - adaptic Secondary Dressing: Wound #1 Left,Lateral Lower Leg: ABD pad Wound #2 Right,Lateral Lower Leg: ABD pad Dressing Change Frequency: Wound #1 Left,Lateral Lower Leg: Change dressing every week Wound #2 Right,Lateral Lower Leg: Change dressing every week Follow-up Appointments: Wound #1  Left,Lateral Lower Leg: Return Appointment in 1 week. Wound #2 Right,Lateral Lower Leg: Return Appointment in 1 week. Edema Control: Wound #1 Left,Lateral Lower Leg: 3 Layer Compression System - Bilateral Wound #2 Right,Lateral Lower Leg: 3 Layer Compression System - Bilateral 1. My suggestion at this time is can be that we just initiate treatment with Adaptic for the right leg area as I feel like any dressing applied is just to get stuck and prevent adequate healing. We will continue with the compression wrap on the side. 2. I am in a suggest that we continue with the plain collagen underneath the areas of undermining in the wound bed and then subsequently that we also apply the alginate over top in order to help with drainage control. The patient is in agreement with that plan. 3. I recommend the patient continue to elevate her legs as much as possible I think that is also important. TIMEKA, GOETTE ([id number]) We will see patient back for reevaluation in 1 week here in the clinic. If anything worsens or changes patient will contact our office for additional recommendations. Electronic Signature(s) Signed: 07/24/2019 11:16:48 AM By: Worthy Keeler PA-C Entered By: Worthy Keeler on 07/24/2019 11:16:47 Snider, [name] ([id number]) -------------------------------------------------------------------------------- SuperBill Details Patient Name: [name] Date of Service: 07/24/2019 Medical Record Number: [id number] Patient Account Number: [id number] Date of Birth/Sex: December 03, 1940 (79 y.o. F) Treating RN: Cornell Barman Primary Care Provider: Lemmie Evens Other Clinician: Referring Provider: Lemmie Evens Treating Provider/Extender: Melburn Hake, HOYT Weeks in Treatment: 2 Diagnosis Coding ICD-10 Codes Code Description S81.802A Unspecified open wound, left lower leg, initial encounter S81.801A Unspecified open wound, right lower leg, initial encounter Non-pressure chronic ulcer  of other part of left lower leg with muscle involvement without evidence of L97.825 necrosis L97.812 Non-pressure chronic ulcer of other part of right lower leg with fat layer exposed I48.0 Paroxysmal atrial fibrillation I10 Essential (primary) hypertension I25.10 Atherosclerotic heart disease of native coronary artery without angina pectoris I73.89 Other specified peripheral vascular diseases F17.218 Nicotine dependence, cigarettes, with other nicotine-induced disorders Facility Procedures CPT4 Code Description: [id number] 11042 - DEB SUBQ TISSUE 20 SQ CM/< ICD-10 Diagnosis Description L97.812 Non-pressure chronic ulcer of other part of right lower leg wi Modifier: th fat layer expo Quantity: 1 sed Physician Procedures CPT4 Code Description: 6440347 11042 - WC PHYS SUBQ TISS 20 SQ CM ICD-10 Diagnosis Description L97.812 Non-pressure chronic ulcer of other part of right lower leg wi Modifier: th fat layer expo Quantity: 1 sed Electronic Signature(s) Signed: 07/24/2019 11:17:15 AM By: Worthy Keeler PA-C Entered By: Melburn Hake,  Hoyt on 07/24/2019 11:17:14

## 2019-07-25 ENCOUNTER — Ambulatory Visit
Admission: RE | Admit: 2019-07-25 | Discharge: 2019-07-25 | Disposition: A | Discharge status: Home / Self Care | Payer: Medicare HMO | Source: Ambulatory Visit | Attending: Nephrology | Admitting: Nephrology

## 2019-07-25 NOTE — Progress Notes (Signed)
DOTSIE, GILLETTE ([id number]) Visit Report for 07/24/2019 Arrival Information Details Patient Name: [name], [name]. Date of Service: 07/24/2019 10:45 AM Medical Record Number: [id number] Patient Account Number: [id number] Date of Birth/Sex: 01/03/1941 (79 y.o. F) Treating RN: Cornell Barman Primary Care Marlana Mckowen: Lemmie Evens Other Clinician: Referring Audrea Bolte: Lemmie Evens Treating Tayah Idrovo/Extender: Melburn Hake, HOYT Weeks in Treatment: 2 Visit Information History Since Last Visit Added or deleted any medications: No Patient Arrived: Walker Any new allergies or adverse reactions: No Arrival Time: 10:46 Had a fall or experienced change in No Accompanied By: self activities of daily living that may affect Transfer Assistance: None risk of falls: Patient Identification Verified: Yes Signs or symptoms of abuse/neglect since last visito No Secondary Verification Process Yes Hospitalized since last visit: No Completed: Implantable device outside of the clinic excluding No Patient Has Alerts: Yes cellular tissue based products placed in the center Patient Alerts: ABI 05/23/2019 since last visit: (L)1.08 (R) 0.95 Has Dressing in Place as Prescribed: Yes TBI (L) 0.92 (R) Has Compression in Place as Prescribed: Yes 0.87 Pain Present Now: No Electronic Signature(s) Signed: 07/24/2019 3:52:50 PM By: Lorine Bears RCP, RRT, CHT Entered By: Lorine Bears on 07/24/2019 10:47:37 Umphlett, [name] ([id number]) -------------------------------------------------------------------------------- Compression Therapy Details Patient Name: [name] Date of Service: 07/24/2019 10:45 AM Medical Record Number: [id number] Patient Account Number: [id number] Date of Birth/Sex: July 24, 1940 (79 y.o. F) Treating RN: Army Melia Primary Care Renita Brocks: Lemmie Evens Other Clinician: Referring Tagen Milby: Lemmie Evens Treating Regena Delucchi/Extender: Melburn Hake, HOYT Weeks  in Treatment: 2 Compression Therapy Performed for Wound Assessment: Wound #1 Left,Lateral Lower Leg Performed By: Clinician Army Melia, RN Compression Type: Three Layer Pre Treatment ABI: 0.9 Post Procedure Diagnosis Same as Pre-procedure Electronic Signature(s) Signed: 07/24/2019 3:23:32 PM By: Army Melia Entered By: Army Melia on 07/24/2019 11:10:51 Blizard, [name] ([id number]) -------------------------------------------------------------------------------- Compression Therapy Details Patient Name: [name], [name]. Date of Service: 07/24/2019 10:45 AM Medical Record Number: [id number] Patient Account Number: [id number] Date of Birth/Sex: 1940-07-15 (79 y.o. F) Treating RN: Army Melia Primary Care Renel Ende: Lemmie Evens Other Clinician: Referring Jatavion Peaster: Lemmie Evens Treating Dewey Viens/Extender: Melburn Hake, HOYT Weeks in Treatment: 2 Compression Therapy Performed for Wound Assessment: Wound #2 Right,Lateral Lower Leg Performed By: Clinician Army Melia, RN Compression Type: Three Layer Pre Treatment ABI: 0.9 Post Procedure Diagnosis Same as Pre-procedure Electronic Signature(s) Signed: 07/24/2019 3:23:32 PM By: Army Melia Entered By: Army Melia on 07/24/2019 11:10:51 Robers, [name] ([id number]) -------------------------------------------------------------------------------- Encounter Discharge Information Details Patient Name: [name], [name]. Date of Service: 07/24/2019 10:45 AM Medical Record Number: [id number] Patient Account Number: [id number] Date of Birth/Sex: 23-Feb-1941 (79 y.o. F) Treating RN: Army Melia Primary Care Murl Zogg: Lemmie Evens Other Clinician: Referring Dafney Farler: Lemmie Evens Treating Dwyane Dupree/Extender: Melburn Hake, HOYT Weeks in Treatment: 2 Encounter Discharge Information Items Post Procedure Vitals Discharge Condition: Stable Temperature (F): 98.4 Ambulatory Status: Ambulatory Pulse (bpm): 83 Discharge Destination:  Home Respiratory Rate (breaths/min): 16 Transportation: Private Auto Blood Pressure (mmHg): 152/68 Accompanied By: self Schedule Follow-up Appointment: Yes Clinical Summary of Care: Electronic Signature(s) Signed: 07/24/2019 3:23:32 PM By: Army Melia Entered By: Army Melia on 07/24/2019 11:12:07 Eckhart, [name] ([id number]) -------------------------------------------------------------------------------- Lower Extremity Assessment Details Patient Name: [name]. Date of Service: 07/24/2019 10:45 AM Medical Record Number: [id number] Patient Account Number: [id number] Date of Birth/Sex: 10-Jul-1940 (79 y.o. F) Treating RN: Army Melia Primary Care Joby Hershkowitz: Lemmie Evens Other Clinician: Referring Amitai Delaughter: Lemmie Evens Treating Sherelle Castelli/Extender: STONE III, HOYT Weeks in Treatment: 2 Edema Assessment Assessed: [  Left: No] [Right: No] Edema: [Left: No] [Right: No] Calf Left: Right: Point of Measurement: 30 cm From Medial Instep 38 cm 34 cm Ankle Left: Right: Point of Measurement: 10 cm From Medial Instep 21 cm 20 cm Vascular Assessment Pulses: Dorsalis Pedis Palpable: [Left:Yes] [Right:Yes] Electronic Signature(s) Signed: 07/24/2019 3:23:32 PM By: Army Melia Entered By: Army Melia on 07/24/2019 11:01:18 Chavous, [name] ([id number]) -------------------------------------------------------------------------------- Multi Wound Chart Details Patient Name: [name]. Date of Service: 07/24/2019 10:45 AM Medical Record Number: [id number] Patient Account Number: [id number] Date of Birth/Sex: [birth year] (79 y.o. F) Treating RN: Army Melia Primary Care Shawndell Varas: Lemmie Evens Other Clinician: Referring Stori Royse: Lemmie Evens Treating Stasia Somero/Extender: STONE III, HOYT Weeks in Treatment: 2 Vital Signs Height(in): 6 Pulse(bpm): 35 Weight(lbs): 180 Blood Pressure(mmHg): 152/68 Body Mass Index(BMI): 28 Temperature(F): 98.4 Respiratory  Rate 18 (breaths/min): Photos: [N/A:N/A] Wound Location: Left Lower Leg - Lateral Right Lower Leg - Lateral N/A Wounding Event: Trauma Trauma N/A Primary Etiology: Trauma, Other Trauma, Other N/A Comorbid History: Cataracts, Asthma, Chronic Cataracts, Asthma, Chronic N/A Obstructive Pulmonary Obstructive Pulmonary Disease (COPD), Arrhythmia, Disease (COPD), Arrhythmia, Coronary Artery Disease, Coronary Artery Disease, Hypertension, Hypotension, Hypertension, Hypotension, Myocardial Infarction, Myocardial Infarction, Peripheral Arterial Disease Peripheral Arterial Disease Date Acquired: 06/18/2019 06/26/2019 N/A Weeks of Treatment: 2 2 N/A Wound Status: Open Open N/A Measurements L x W x D 4x2.8x0.5 0.8x3.5x0.1 N/A (cm) Area (cm) : 8.796 2.199 N/A Volume (cm) : 4.398 0.22 N/A % Reduction in Area: 27.70% 76.70% N/A % Reduction in Volume: 27.70% 76.60% N/A Starting Position 1 12 (o'clock): Ending Position 1 12 (o'clock): Maximum Distance 1 (cm): 1.1 Undermining: Yes N/A N/A Classification: Full Thickness With Exposed Full Thickness Without N/A Support Structures Exposed Support Structures Exudate Amount: Large Medium N/A Exudate Type: Serous Serous N/A [name], [name] ([id number]) Exudate Color: amber amber N/A Wound Margin: Flat and Intact Fibrotic scar, thickened scar N/A Granulation Amount: Small (1-33%) Medium (34-66%) N/A Granulation Quality: Red Pink N/A Necrotic Amount: Large (67-100%) Medium (34-66%) N/A Necrotic Tissue: Adherent Slough Eschar N/A Exposed Structures: Fat Layer (Subcutaneous Fat Layer (Subcutaneous N/A Tissue) Exposed: Yes Tissue) Exposed: Yes Muscle: Yes Fascia: No Fascia: No Tendon: No Tendon: No Muscle: No Joint: No Joint: No Bone: No Bone: No Epithelialization: None Small (1-33%) N/A Treatment Notes Electronic Signature(s) Signed: 07/24/2019 3:23:32 PM By: Army Melia Entered By: Army Melia on 07/24/2019 11:01:40 Pongratz, [name]  ([id number]) -------------------------------------------------------------------------------- [name], BYE. Date of Service: 07/24/2019 10:45 AM Medical Record Number: [id number] Patient Account Number: [id number] Date of Birth/Sex: [birth year] (79 y.o. F) Treating RN: Army Melia Primary Care Fallon Howerter: Lemmie Evens Other Clinician: Referring Evia Goldsmith: Lemmie Evens Treating Lea Baine/Extender: Melburn Hake, HOYT Weeks in Treatment: 2 Active Inactive Abuse / Safety / Falls / Self Care Management Nursing Diagnoses: Potential for falls Goals: Patient will not experience any injury related to falls Date Initiated: 07/10/2019 Target Resolution Date: 10/13/2019 Goal Status: Active Interventions: Assess fall risk on admission and as needed Notes: Necrotic Tissue Nursing Diagnoses: Impaired tissue integrity related to necrotic/devitalized tissue Goals: Necrotic/devitalized tissue will be minimized in the wound bed Date Initiated: 07/10/2019 Target Resolution Date: 10/13/2019 Goal Status: Active Interventions: Provide education on necrotic tissue and debridement process Notes: Orientation to the Wound Care Program Nursing Diagnoses: Knowledge deficit related to the wound healing center program Goals: Patient/caregiver will verbalize understanding of the [facility] Date Initiated: 07/10/2019 Target Resolution Date: 10/13/2019 Goal Status: Active Interventions: Provide education on orientation to the wound  center [name], [name] ([id number]) Notes: Wound/Skin Impairment Nursing Diagnoses: Impaired tissue integrity Goals: Ulcer/skin breakdown will heal within 14 weeks Date Initiated: 07/10/2019 Target Resolution Date: 10/13/2019 Goal Status: Active Interventions: Assess patient/caregiver ability to obtain necessary supplies Assess patient/caregiver ability to perform ulcer/skin care regimen upon admission and as  needed Assess ulceration(s) every visit Notes: Electronic Signature(s) Signed: 07/24/2019 3:23:32 PM By: Army Melia Entered By: Army Melia on 07/24/2019 11:01:31 [name], [name] ([id number]) -------------------------------------------------------------------------------- Pain Assessment Details Patient Name: [name]. Date of Service: 07/24/2019 10:45 AM Medical Record Number: [id number] Patient Account Number: [id number] Date of Birth/Sex: [birth year] (79 y.o. F) Treating RN: Cornell Barman Primary Care Darrah Dredge: Lemmie Evens Other Clinician: Referring Marlinda Miranda: Lemmie Evens Treating Cyndia Degraff/Extender: Melburn Hake, HOYT Weeks in Treatment: 2 Active Problems Location of Pain Severity and Description of Pain Patient Has Paino No Site Locations Pain Management and Medication Current Pain Management: Electronic Signature(s) Signed: 07/24/2019 3:52:50 PM By: Lorine Bears RCP, RRT, CHT Signed: 07/25/2019 3:29:52 PM By: Gretta Cool, BSN, RN, CWS, Kim RN, BSN Entered By: Lorine Bears on 07/24/2019 10:48:19 [name], [name] ([id number]) -------------------------------------------------------------------------------- Patient/Caregiver Education Details Patient Name: [name], [name] Date of Service: 07/24/2019 10:45 AM Medical Record Number: [id number] Patient Account Number: [id number] Date of Birth/Gender: August 13, 1940 (79 y.o. F) Treating RN: Army Melia Primary Care Physician: Lemmie Evens Other Clinician: Referring Physician: Lemmie Evens Treating Physician/Extender: Sharalyn Ink in Treatment: 2 Education Assessment Education Provided To: Patient Education Topics Provided Wound/Skin Impairment: Handouts: Caring for Your Ulcer Methods: Demonstration, Explain/Verbal Responses: State content correctly Electronic Signature(s) Signed: 07/24/2019 3:23:32 PM By: Army Melia Entered By: Army Melia on 07/24/2019 11:11:05 Schue, [name]  ([id number]) -------------------------------------------------------------------------------- Wound Assessment Details Patient Name: [name]. Date of Service: 07/24/2019 10:45 AM Medical Record Number: [id number] Patient Account Number: [id number] Date of Birth/Sex: 02/06/1941 (79 y.o. F) Treating RN: Army Melia Primary Care Okley Magnussen: Lemmie Evens Other Clinician: Referring Brittanyann Wittner: Lemmie Evens Treating Abrina Petz/Extender: STONE III, HOYT Weeks in Treatment: 2 Wound Status Wound Number: 1 Primary Trauma, Other Etiology: Wound Location: Left Lower Leg - Lateral Wound Open Wounding Event: Trauma Status: Date Acquired: 06/18/2019 Comorbid Cataracts, Asthma, Chronic Obstructive Weeks Of Treatment: 2 History: Pulmonary Disease (COPD), Arrhythmia, Clustered Wound: No Coronary Artery Disease, Hypertension, Hypotension, Myocardial Infarction, Peripheral Arterial Disease Photos Wound Measurements Length: (cm) 4 % Reductio Width: (cm) 2.8 % Reductio Depth: (cm) 0.5 Epithelial Area: (cm) 8.796 Tunneling Volume: (cm) 4.398 Undermini Startin Ending Maximum n in Area: 27.7% n in Volume: 27.7% ization: None : No ng: Yes g Position (o'clock): 12 Position (o'clock): 12 Distance: (cm) 1.1 Wound Description Full Thickness With Exposed Support Foul Odor Classification: Structures Slough/Fib Wound Margin: Flat and Intact Exudate Large Amount: Exudate Type: Serous Exudate Color: amber After Cleansing: No rino Yes Wound Bed Granulation Amount: Small (1-33%) Exposed Structure Granulation Quality: Red Fascia Exposed: No [name], Kim C. ([id number]) Necrotic Amount: Large (67-100%) Fat Layer (Subcutaneous Tissue) Exposed: Yes Necrotic Quality: Adherent Slough Tendon Exposed: No Muscle Exposed: Yes Necrosis of Muscle: No Joint Exposed: No Bone Exposed: No Treatment Notes Wound #1 (Left, Lateral Lower Leg) Notes plain prisma on left with s. cell, adaptic on  right ABD to both, 3 layer to both with unna Electronic Signature(s) Signed: 07/24/2019 3:23:32 PM By: Army Melia Entered By: Army Melia on 07/24/2019 10:58:49 [name], [name] ([id number]) -------------------------------------------------------------------------------- Wound Assessment Details Patient Name: [name]. Date of Service: 07/24/2019 10:45 AM Medical Record Number: [id number] Patient Account Number: [id number] Date  of Birth/Sex: [birth year] (79 y.o. F) Treating RN: Army Melia Primary Care Milinda Sweeney: Lemmie Evens Other Clinician: Referring Avish Torry: Lemmie Evens Treating Johnye Kist/Extender: Melburn Hake, HOYT Weeks in Treatment: 2 Wound Status Wound Number: 2 Primary Trauma, Other Etiology: Wound Location: Right Lower Leg - Lateral Wound Open Wounding Event: Trauma Status: Date Acquired: 06/26/2019 Comorbid Cataracts, Asthma, Chronic Obstructive Weeks Of Treatment: 2 History: Pulmonary Disease (COPD), Arrhythmia, Clustered Wound: No Coronary Artery Disease, Hypertension, Hypotension, Myocardial Infarction, Peripheral Arterial Disease Photos Wound Measurements Length: (cm) 0.8 Width: (cm) 3.5 Depth: (cm) 0.1 Area: (cm) 2.199 Volume: (cm) 0.22 % Reduction in Area: 76.7% % Reduction in Volume: 76.6% Epithelialization: Small (1-33%) Wound Description Full Thickness Without Exposed Support Foul Od Classification: Structures Slough/ Wound Margin: Fibrotic scar, thickened scar Exudate Medium Amount: Exudate Type: Serous Exudate Color: amber or After Cleansing: No Fibrino Yes Wound Bed Granulation Amount: Medium (34-66%) Exposed Structure Granulation Quality: Pink Fascia Exposed: No Necrotic Amount: Medium (34-66%) Fat Layer (Subcutaneous Tissue) Exposed: Yes Necrotic Quality: Eschar Tendon Exposed: No Muscle Exposed: No Joint Exposed: No Kohler, Donya C. ([id number]) Bone Exposed: No Treatment Notes Wound #2 (Right, Lateral Lower  Leg) Notes plain prisma on left with s. cell, adaptic on right ABD to both, 3 layer to both with unna Electronic Signature(s) Signed: 07/24/2019 3:23:32 PM By: Army Melia Entered By: Army Melia on 07/24/2019 11:00:06 Jaffee, [name] ([id number]) -------------------------------------------------------------------------------- Vitals Details Patient Name: [name] Date of Service: 07/24/2019 10:45 AM Medical Record Number: [id number] Patient Account Number: [id number] Date of Birth/Sex: 05-13-1941 (79 y.o. F) Treating RN: Cornell Barman Primary Care Arhaan Chesnut: Lemmie Evens Other Clinician: Referring Dillin Lofgren: Lemmie Evens Treating Leshae Mcclay/Extender: Melburn Hake, HOYT Weeks in Treatment: 2 Vital Signs Time Taken: 10:45 Temperature (F): 98.4 Height (in): 67 Pulse (bpm): 83 Weight (lbs): 180 Respiratory Rate (breaths/min): 18 Body Mass Index (BMI): 28.2 Blood Pressure (mmHg): 152/68 Reference Range: 80 - 120 mg / dl Electronic Signature(s) Signed: 07/24/2019 3:52:50 PM By: Lorine Bears RCP, RRT, CHT Entered By: Lorine Bears on 07/24/2019 10:48:50

## 2019-07-31 ENCOUNTER — Ambulatory Visit: Payer: Medicare HMO | Admitting: Physician Assistant

## 2019-08-02 ENCOUNTER — Other Ambulatory Visit: Payer: Self-pay

## 2019-08-02 ENCOUNTER — Encounter: Payer: Medicare HMO | Admitting: Physician Assistant

## 2019-08-02 DIAGNOSIS — S81802A Unspecified open wound, left lower leg, initial encounter: Secondary | ICD-10-CM | POA: Diagnosis not present

## 2019-08-02 NOTE — Progress Notes (Addendum)
[name] ([id number]) Visit Report for 08/02/2019 Arrival Information Details Patient Name: [name], [name]. Date of Service: 08/02/2019 1:30 PM Medical Record Number: [id number] Patient Account Number: [id number] Date of Birth/Sex: Nov 17, 1940 (78 y.o. F) Treating RN: Cornell Barman Primary Care Tehani Mersman: Lemmie Evens Other Clinician: Referring Brogen Duell: Lemmie Evens Treating Evalynne Locurto/Extender: Melburn Hake, HOYT Weeks in Treatment: 3 Visit Information History Since Last Visit Added or deleted any medications: No Patient Arrived: Walker Any new allergies or adverse reactions: No Arrival Time: 13:54 Had a fall or experienced change in No Accompanied By: self activities of daily living that may affect Transfer Assistance: None risk of falls: Patient Identification Verified: Yes Signs or symptoms of abuse/neglect since last visito No Secondary Verification Process Yes Hospitalized since last visit: No Completed: Implantable device outside of the clinic excluding No Patient Has Alerts: Yes cellular tissue based products placed in the center Patient Alerts: ABI 05/23/2019 since last visit: (L)1.08 (R) 0.95 Pain Present Now: No TBI (L) 0.92 (R) 0.87 Electronic Signature(s) Signed: 08/24/2019 11:50:22 AM By: Gretta Cool, BSN, RN, CWS, Kim RN, BSN Entered By: Gretta Cool, BSN, RN, CWS, Kim on 08/02/2019 13:55:10 Houdek, [name] ([id number]) -------------------------------------------------------------------------------- Compression Therapy Details Patient Name: [name], [name] Date of Service: 08/02/2019 1:30 PM Medical Record Number: [id number] Patient Account Number: [id number] Date of Birth/Sex: 07-Oct-1940 (78 y.o. F) Treating RN: Army Melia Primary Care Meisha Salone: Lemmie Evens Other Clinician: Referring Remijio Holleran: Lemmie Evens Treating Krisandra Bueno/Extender: Melburn Hake, HOYT Weeks in Treatment: 3 Compression Therapy Performed for Wound Assessment: Wound #1 Left,Lateral Lower  Leg Performed By: Clinician Army Melia, RN Compression Type: Three Layer Pre Treatment ABI: 0.9 Post Procedure Diagnosis Same as Pre-procedure Electronic Signature(s) Signed: 08/02/2019 3:29:42 PM By: Army Melia Entered By: Army Melia on 08/02/2019 14:35:54 Judson, [name] ([id number]) -------------------------------------------------------------------------------- Encounter Discharge Information Details Patient Name: DIA, DONATE. Date of Service: 08/02/2019 1:30 PM Medical Record Number: [id number] Patient Account Number: [id number] Date of Birth/Sex: 1941/03/08 (78 y.o. F) Treating RN: Army Melia Primary Care Richardo Popoff: Lemmie Evens Other Clinician: Referring Deo Mehringer: Lemmie Evens Treating Titania Gault/Extender: Melburn Hake, HOYT Weeks in Treatment: 3 Encounter Discharge Information Items Discharge Condition: Stable Ambulatory Status: Walker Discharge Destination: Home Transportation: Private Auto Accompanied By: self Schedule Follow-up Appointment: Yes Clinical Summary of Care: Electronic Signature(s) Signed: 08/02/2019 3:29:42 PM By: Army Melia Entered By: Army Melia on 08/02/2019 14:37:28 Checo, [name] ([id number]) -------------------------------------------------------------------------------- Lower Extremity Assessment Details Patient Name: [name], [name]. Date of Service: 08/02/2019 1:30 PM Medical Record Number: [id number] Patient Account Number: [id number] Date of Birth/Sex: [birth year] (78 y.o. F) Treating RN: Cornell Barman Primary Care Sedra Morfin: Lemmie Evens Other Clinician: Referring Tyana Butzer: Lemmie Evens Treating Samarion Ehle/Extender: Melburn Hake, HOYT Weeks in Treatment: 3 Edema Assessment Assessed: [Left: No] [Right: No] [Left: Edema] [Right: :] Calf Left: Right: Point of Measurement: 30 cm From Medial Instep 41 cm 38.5 cm Ankle Left: Right: Point of Measurement: 10 cm From Medial Instep 20.5 cm 20 cm Vascular Assessment Pulses: Dorsalis  Pedis Palpable: [Left:Yes] [Right:Yes] Electronic Signature(s) Signed: 08/24/2019 11:50:22 AM By: Gretta Cool, BSN, RN, CWS, Kim RN, BSN Entered By: Gretta Cool, BSN, RN, CWS, Kim on 08/02/2019 14:10:16 Reas, [name] ([id number]) -------------------------------------------------------------------------------- Multi Wound Chart Details Patient Name: [name] Date of Service: 08/02/2019 1:30 PM Medical Record Number: [id number] Patient Account Number: [id number] Date of Birth/Sex: 1940/10/06 (78 y.o. F) Treating RN: Army Melia Primary Care Takima Encina: Lemmie Evens Other Clinician: Referring Shawndale Kilpatrick: Lemmie Evens Treating Loye Vento/Extender: STONE III, HOYT Weeks in Treatment: 3 Vital Signs Height(in): 74  Pulse(bpm): 60 Weight(lbs): 180 Blood Pressure(mmHg): 156/66 Body Mass Index(BMI): 28 Temperature(F): 98.1 Respiratory Rate 18 (breaths/min): Photos: [N/A:N/A] Wound Location: Left, Lateral Lower Leg Right, Lateral Lower Leg N/A Wounding Event: Trauma Trauma N/A Primary Etiology: Trauma, Other Trauma, Other N/A Comorbid History: Cataracts, Asthma, Chronic Cataracts, Asthma, Chronic N/A Obstructive Pulmonary Obstructive Pulmonary Disease (COPD), Arrhythmia, Disease (COPD), Arrhythmia, Coronary Artery Disease, Coronary Artery Disease, Hypertension, Hypotension, Hypertension, Hypotension, Myocardial Infarction, Myocardial Infarction, Peripheral Arterial Disease Peripheral Arterial Disease Date Acquired: 06/18/2019 06/26/2019 N/A Weeks of Treatment: 3 3 N/A Wound Status: Open Healed - Epithelialized N/A Measurements L x W x D 3x2.7x0.4 0x0x0 N/A (cm) Area (cm) : 6.362 0 N/A Volume (cm) : 2.545 0 N/A % Reduction in Area: 47.70% 100.00% N/A % Reduction in Volume: 58.20% 100.00% N/A Starting Position 1 10 (o'clock): Ending Position 1 7 (o'clock): Maximum Distance 1 (cm): 1.2 Undermining: Yes N/A N/A Classification: Full Thickness With Exposed Full Thickness Without  N/A Support Structures Exposed Support Structures Exudate Amount: Medium Medium N/A Exudate Type: Serous Serous N/A BAUDELIA, SCHROEPFER ([id number]) Exudate Color: amber amber N/A Wound Margin: Flat and Intact Fibrotic scar, thickened scar N/A Granulation Amount: Small (1-33%) None Present (0%) N/A Granulation Quality: Red N/A N/A Necrotic Amount: Large (67-100%) None Present (0%) N/A Exposed Structures: Fat Layer (Subcutaneous Fat Layer (Subcutaneous N/A Tissue) Exposed: Yes Tissue) Exposed: Yes Muscle: Yes Fascia: No Fascia: No Tendon: No Tendon: No Muscle: No Joint: No Joint: No Bone: No Bone: No Epithelialization: None Large (67-100%) N/A Treatment Notes Electronic Signature(s) Signed: 08/02/2019 3:29:42 PM By: Army Melia Entered By: Army Melia on 08/02/2019 14:33:23 Shreeve, [name] ([id number]) -------------------------------------------------------------------------------- [place] Details Patient Name: [name]. Date of Service: 08/02/2019 1:30 PM Medical Record Number: [id number] Patient Account Number: [id number] Date of Birth/Sex: 03-02-1941 (78 y.o. F) Treating RN: Army Melia Primary Care Bird Swetz: Lemmie Evens Other Clinician: Referring Denali Becvar: Lemmie Evens Treating Estera Ozier/Extender: Melburn Hake, HOYT Weeks in Treatment: 3 Active Inactive Abuse / Safety / Falls / Self Care Management Nursing Diagnoses: Potential for falls Goals: Patient will not experience any injury related to falls Date Initiated: 07/10/2019 Target Resolution Date: 10/13/2019 Goal Status: Active Interventions: Assess fall risk on admission and as needed Notes: Necrotic Tissue Nursing Diagnoses: Impaired tissue integrity related to necrotic/devitalized tissue Goals: Necrotic/devitalized tissue will be minimized in the wound bed Date Initiated: 07/10/2019 Target Resolution Date: 10/13/2019 Goal Status: Active Interventions: Provide education on  necrotic tissue and debridement process Notes: Orientation to the Wound Care Program Nursing Diagnoses: Knowledge deficit related to the wound healing center program Goals: Patient/caregiver will verbalize understanding of the [facility] Date Initiated: 07/10/2019 Target Resolution Date: 10/13/2019 Goal Status: Active Interventions: Provide education on orientation to the wound center MATHILDE, MCWHERTER C. ([id number]) Notes: Wound/Skin Impairment Nursing Diagnoses: Impaired tissue integrity Goals: Ulcer/skin breakdown will heal within 14 weeks Date Initiated: 07/10/2019 Target Resolution Date: 10/13/2019 Goal Status: Active Interventions: Assess patient/caregiver ability to obtain necessary supplies Assess patient/caregiver ability to perform ulcer/skin care regimen upon admission and as needed Assess ulceration(s) every visit Notes: Electronic Signature(s) Signed: 08/02/2019 3:29:42 PM By: Army Melia Entered By: Army Melia on 08/02/2019 14:33:10 Morace, [name] ([id number]) -------------------------------------------------------------------------------- Pain Assessment Details Patient Name: [name]. Date of Service: 08/02/2019 1:30 PM Medical Record Number: [id number] Patient Account Number: [id number] Date of Birth/Sex: [birth year] (78 y.o. F) Treating RN: Cornell Barman Primary Care Tacha Manni: Lemmie Evens Other Clinician: Referring Caliya Narine: Lemmie Evens Treating Arieh Bogue/Extender: STONE III, HOYT Weeks in Treatment:  3 Active Problems Location of Pain Severity and Description of Pain Patient Has Paino No Site Locations Pain Management and Medication Current Pain Management: Notes Patient denies pain at this time. Electronic Signature(s) Signed: 08/24/2019 11:50:22 AM By: Gretta Cool, BSN, RN, CWS, Kim RN, BSN Entered By: Gretta Cool, BSN, RN, CWS, Kim on 08/02/2019 13:55:39 Guerry, [name]  ([id number]) -------------------------------------------------------------------------------- Patient/Caregiver Education Details Patient Name: NAJE, RICE. Date of Service: 08/02/2019 1:30 PM Medical Record Number: [id number] Patient Account Number: [id number] Date of Birth/Gender: 1940/08/14 (79 y.o. F) Treating RN: Army Melia Primary Care Physician: Lemmie Evens Other Clinician: Referring Physician: Lemmie Evens Treating Physician/Extender: Sharalyn Ink in Treatment: 3 Education Assessment Education Provided To: Patient Education Topics Provided Wound/Skin Impairment: Handouts: Caring for Your Ulcer Methods: Demonstration, Explain/Verbal Responses: State content correctly Electronic Signature(s) Signed: 08/02/2019 3:29:42 PM By: Army Melia Entered By: Army Melia on 08/02/2019 14:36:49 Hildebrant, [name] ([id number]) -------------------------------------------------------------------------------- Wound Assessment Details Patient Name: [name]. Date of Service: 08/02/2019 1:30 PM Medical Record Number: [id number] Patient Account Number: [id number] Date of Birth/Sex: 1941-02-28 (78 y.o. F) Treating RN: Army Melia Primary Care Yasseen Salls: Lemmie Evens Other Clinician: Referring Quashawn Jewkes: Lemmie Evens Treating Reegan Mctighe/Extender: Melburn Hake, HOYT Weeks in Treatment: 3 Wound Status Wound Number: 1 Primary Trauma, Other Etiology: Wound Location: Left, Lateral Lower Leg Wound Open Wounding Event: Trauma Status: Date Acquired: 06/18/2019 Comorbid Cataracts, Asthma, Chronic Obstructive Weeks Of Treatment: 3 History: Pulmonary Disease (COPD), Arrhythmia, Clustered Wound: No Coronary Artery Disease, Hypertension, Hypotension, Myocardial Infarction, Peripheral Arterial Disease Photos Wound Measurements Length: (cm) 3 % Reduction Width: (cm) 2.7 % Reduction Depth: (cm) 0.4 Epitheliali Area: (cm) 6.362 Underminin Volume: (cm) 2.545  Startin Ending P Maximum in Area: 47.7% in Volume: 58.2% zation: None g: Yes g Position (o'clock): 10 osition (o'clock): 7 Distance: (cm) 1.2 Wound Description Full Thickness With Exposed Support Foul Odor A Classification: Structures Slough/Fibr Wound Margin: Flat and Intact Exudate Medium Amount: Exudate Type: Serous Exudate Color: amber fter Cleansing: No ino Yes Wound Bed Granulation Amount: Small (1-33%) Exposed Structure Granulation Quality: Red Fascia Exposed: No Necrotic Amount: Large (67-100%) Fat Layer (Subcutaneous Tissue) Exposed: Yes Larsen, Yittel C. ([id number]) Necrotic Quality: Adherent Slough Tendon Exposed: No Muscle Exposed: Yes Necrosis of Muscle: No Joint Exposed: No Bone Exposed: No Electronic Signature(s) Signed: 08/02/2019 3:29:42 PM By: Army Melia Entered By: Army Melia on 08/02/2019 14:33:02 Marti, [name] ([id number]) -------------------------------------------------------------------------------- Wound Assessment Details Patient Name: [name]. Date of Service: 08/02/2019 1:30 PM Medical Record Number: [id number] Patient Account Number: [id number] Date of Birth/Sex: January 02, 1941 (78 y.o. F) Treating RN: Army Melia Primary Care Yasiel Goyne: Lemmie Evens Other Clinician: Referring Alexandra Posadas: Lemmie Evens Treating Siana Panameno/Extender: Melburn Hake, HOYT Weeks in Treatment: 3 Wound Status Wound Number: 2 Primary Trauma, Other Etiology: Wound Location: Right, Lateral Lower Leg Wound Healed - Epithelialized Wounding Event: Trauma Status: Date Acquired: 06/26/2019 Comorbid Cataracts, Asthma, Chronic Obstructive Weeks Of Treatment: 3 History: Pulmonary Disease (COPD), Arrhythmia, Clustered Wound: No Coronary Artery Disease, Hypertension, Hypotension, Myocardial Infarction, Peripheral Arterial Disease Photos Wound Measurements Length: (cm) 0 Width: (cm) 0 Depth: (cm) 0 Area: (cm) 0 Volume: (cm) 0 % Reduction in Area:  100% % Reduction in Volume: 100% Epithelialization: Large (67-100%) Wound Description Full Thickness Without Exposed Support Foul Odo Classification: Structures Slough/F Wound Margin: Fibrotic scar, thickened scar Exudate Medium Amount: Exudate Type: Serous Exudate Color: amber r After Cleansing: No ibrino Yes Wound Bed Granulation Amount: None Present (0%) Exposed Structure Necrotic Amount: None Present (0%) Fascia Exposed: No  Fat Layer (Subcutaneous Tissue) Exposed: Yes Tendon Exposed: No Muscle Exposed: No Joint Exposed: No Bulluck, Davia C. ([id number]) Bone Exposed: No Electronic Signature(s) Signed: 08/02/2019 3:29:42 PM By: Army Melia Entered By: Army Melia on 08/02/2019 14:33:02 Schouten, [name] ([id number]) -------------------------------------------------------------------------------- Vitals Details Patient Name: [name]. Date of Service: 08/02/2019 1:30 PM Medical Record Number: [id number] Patient Account Number: [id number] Date of Birth/Sex: 05-Jun-1941 (78 y.o. F) Treating RN: Cornell Barman Primary Care Kahlia Lagunes: Lemmie Evens Other Clinician: Referring Halim Surrette: Lemmie Evens Treating Jeno Calleros/Extender: Melburn Hake, HOYT Weeks in Treatment: 3 Vital Signs Time Taken: 13:55 Temperature (F): 98.1 Height (in): 67 Pulse (bpm): 60 Weight (lbs): 180 Respiratory Rate (breaths/min): 18 Body Mass Index (BMI): 28.2 Blood Pressure (mmHg): 156/66 Reference Range: 80 - 120 mg / dl Electronic Signature(s) Signed: 08/24/2019 11:50:22 AM By: Gretta Cool, BSN, RN, CWS, Kim RN, BSN Entered By: Gretta Cool, BSN, RN, CWS, Kim on 08/02/2019 13:56:02

## 2019-08-02 NOTE — Progress Notes (Addendum)
GLADIOLA, MADORE ([id number]) Visit Report for 08/02/2019 Chief Complaint Document Details Patient Name: Kim, [name]. Date of Service: 08/02/2019 1:30 PM Medical Record Number: [id number] Patient Account Number: [id number] Date of Birth/Sex: Oct 18, 1940 (79 y.o. F) Treating RN: Army Melia Primary Care Provider: Lemmie Evens Other Clinician: Referring Provider: Lemmie Evens Treating Provider/Extender: Melburn Hake, HOYT Weeks in Treatment: 3 Information Obtained from: Patient Chief Complaint Bilateral LE ulcers Electronic Signature(s) Signed: 08/02/2019 1:23:01 PM By: Worthy Keeler PA-C Entered By: Worthy Keeler on 08/02/2019 13:23:01 [name] ([id number]) -------------------------------------------------------------------------------- HPI Details Patient Name: Kim, [name] Date of Service: 08/02/2019 1:30 PM Medical Record Number: [id number] Patient Account Number: [id number] Date of Birth/Sex: 1941-03-05 (79 y.o. F) Treating RN: Army Melia Primary Care Provider: Lemmie Evens Other Clinician: Referring Provider: Lemmie Evens Treating Provider/Extender: Melburn Hake, HOYT Weeks in Treatment: 3 History of Present Illness HPI Description: 07/10/2019 on evaluation today patient presents for initial inspection here in our clinic concerning wounds that she sustained as a result of traumatic injuries to her left lower leg as well as her right lower leg. The right side is much less severe the left side actually has muscle exposed. This left side she actually had in a car door and I think it did tear into her quite a bit more than the trauma wound which was more of an abrasion on the right lower extremity. Nonetheless she is having a lot of drainage from the left. This is the one obviously that is deeper down to the muscle area. With that being said the patient also has paroxysmal atrial fibrillation, hypertension, coronary artery disease, peripheral vascular disease,  and nicotine dependence. Currently she is not having a lot of severe pain although there is some discomfort. No fevers, chills, nausea, vomiting, or diarrhea. Patient did have arterial studies performed on 05/23/2019 which revealed a left ABI of 1.08 and a right ABI of 0.95. 07/17/2019 on evaluation today patient appears to be doing quite well with regard to her wounds. She has been showing signs of actually dramatic improvement in just 1 week in my opinion. In fact even the area where the muscle is exposed is showing already good signs of new granulation tissue over top of the muscle which is exactly what we want to see. Overall I am very pleased at this point. 07/24/2019 upon evaluation today patient appears to be doing well with regard to her wounds. In fact the right lower extremity ulcer appears to be almost completely healed which is great news some of the dressing material stuck to the periwound region. In regard to the left lower extremity that is showing signs of improvement at slow but nonetheless is still making improvement at this point. 08/02/2019 upon evaluation today patient appears to be doing well with regard to her wounds. The right leg ulcer is actually completely healed the left leg ulcer though not healed is doing significantly better. I am very pleased with how things seem to be progressing. Especially in light of how deep this wound was initially. Electronic Signature(s) Signed: 08/02/2019 4:47:29 PM By: Worthy Keeler PA-C Entered By: Worthy Keeler on 08/02/2019 16:47:29 [name] ([id number]) -------------------------------------------------------------------------------- Physical Exam Details Patient Name: Kim, [name]. Date of Service: 08/02/2019 1:30 PM Medical Record Number: [id number] Patient Account Number: [id number] Date of Birth/Sex: 11/06/1940 (79 y.o. F) Treating RN: Army Melia Primary Care Provider: Lemmie Evens Other Clinician: Referring  Provider: Lemmie Evens Treating Provider/Extender: STONE III, HOYT Weeks in Treatment: 3  Constitutional Well-nourished and well-hydrated in no acute distress. Respiratory normal breathing without difficulty. Psychiatric this patient is able to make decisions and demonstrates good insight into disease process. Alert and Oriented x 3. pleasant and cooperative. Notes Patient's wound bed showed signs of excellent granulation in fact even on the muscle underneath she shows a lot of new skin and granulation around the edges of her wound as well as in the base of the wound over top of the muscle. I am very pleased and hopeful that this will continue to show signs of improvement. No sharp debridement was necessary today. Electronic Signature(s) Signed: 08/02/2019 4:47:50 PM By: Worthy Keeler PA-C Entered By: Worthy Keeler on 08/02/2019 16:47:49 [name] ([id number]) -------------------------------------------------------------------------------- Physician Orders Details Patient Name: [name] Date of Service: 08/02/2019 1:30 PM Medical Record Number: [id number] Patient Account Number: [id number] Date of Birth/Sex: March 27, 1941 (79 y.o. 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 - Please do not get your wraps wet Primary Wound Dressing o Silver Alginate o Collagen - to muscle exposed area Secondary Dressing Wound #1 Left,Lateral Lower Leg o ABD pad Dressing Change Frequency Wound #1 Left,Lateral Lower Leg o Change dressing every week Follow-up Appointments Wound #1 Left,Lateral Lower Leg o Return Appointment in 1 week. Edema Control Wound #1 Left,Lateral Lower Leg o 3 Layer Compression System - Left Lower Extremity Electronic Signature(s) Kim, [name] ([id number]) Signed: 08/02/2019 3:29:42 PM By: Army Melia Signed: 08/02/2019 5:03:37 PM By: Worthy Keeler PA-C Entered By: Army Melia on 08/02/2019 14:36:19 Maqueda, [name] ([id number]) -------------------------------------------------------------------------------- Problem List Details Patient Name: AISLEY, WHAN. Date of Service: 08/02/2019 1:30 PM Medical Record Number: [id number] Patient Account Number: [id number] Date of Birth/Sex: [birth year] (78 y.o. F) Treating RN: Army Melia Primary Care Provider: Lemmie Evens Other Clinician: Referring Provider: Lemmie Evens Treating Provider/Extender: Melburn Hake, HOYT Weeks in Treatment: 3 Active Problems ICD-10 Evaluated Encounter Code Description Active Date Today Diagnosis S81.802A Unspecified open wound, left lower leg, initial encounter 07/10/2019 No Yes S81.801A Unspecified open wound, right lower leg, initial encounter 07/10/2019 No Yes L97.825 Non-pressure chronic ulcer of other part of left lower leg with 07/10/2019 No Yes muscle involvement without evidence of necrosis L97.812 Non-pressure chronic ulcer of other part of right lower leg 07/10/2019 No  Yes with fat layer exposed I48.0 Paroxysmal atrial fibrillation 07/10/2019 No Yes I10 Essential (primary) hypertension 07/10/2019 No Yes I25.10 Atherosclerotic heart disease of native coronary artery 07/10/2019 No Yes without angina pectoris I73.89 Other specified peripheral vascular diseases 07/10/2019 No Yes F17.218 Nicotine dependence, cigarettes, with other nicotine-induced 07/10/2019 No Yes disorders BREAHNA, BOYLEN ([id number]) Inactive Problems Resolved Problems Electronic Signature(s) Signed: 08/02/2019 1:22:56 PM By: Worthy Keeler PA-C Entered By: Joaquim Lai  III, Margarita Grizzle on 08/02/2019 13:22:55 BRITZY, GRAUL ([id number]) -------------------------------------------------------------------------------- Progress Note Details Patient Name: Kim, [name]. Date of Service: 08/02/2019 1:30 PM Medical Record Number: [id number] Patient Account Number: [id number] Date of Birth/Sex: [birth year] (78 y.o. F) Treating RN: Army Melia Primary Care Provider: Lemmie Evens Other Clinician: Referring Provider: Lemmie Evens Treating Provider/Extender: Melburn Hake, HOYT Weeks in Treatment: 3 Subjective Chief Complaint Information obtained from Patient Bilateral LE ulcers History of Present Illness (HPI) 07/10/2019 on evaluation today patient presents for initial inspection here in our clinic concerning wounds that she sustained as a result of traumatic injuries to her left lower leg as well as her right lower leg. The right side is much less severe the left side actually has muscle exposed. This left side she actually had in a car door and I think it did tear into her quite a bit more than the trauma wound which was more of an abrasion on the right lower extremity. Nonetheless she is having a lot of drainage from the left. This is the one obviously that is deeper down to the muscle area. With that being said the patient also has paroxysmal atrial fibrillation, hypertension, coronary artery disease,  peripheral vascular disease, and nicotine dependence. Currently she is not having a lot of severe pain although there is some discomfort. No fevers, chills, nausea, vomiting, or diarrhea. Patient did have arterial studies performed on 05/23/2019 which revealed a left ABI of 1.08 and a right ABI of 0.95. 07/17/2019 on evaluation today patient appears to be doing quite well with regard to her wounds. She has been showing signs of actually dramatic improvement in just 1 week in my opinion. In fact even the area where the muscle is exposed is showing already good signs of new granulation tissue over top of the muscle which is exactly what we want to see. Overall I am very pleased at this point. 07/24/2019 upon evaluation today patient appears to be doing well with regard to her wounds. In fact the right lower extremity ulcer appears to be almost completely healed which is great news some of the dressing material stuck to the periwound region. In regard to the left lower extremity that is showing signs of improvement at slow but nonetheless is still making improvement at this point. 08/02/2019 upon evaluation today patient appears to be doing well with regard to her wounds. The right leg ulcer is actually completely healed the left leg ulcer though not healed is doing significantly better. I am very pleased with how things seem to be progressing. Especially in light of how deep this wound was initially. Objective Constitutional Well-nourished and well-hydrated in no acute distress. Vitals Time Taken: 1:55 PM, Height: 67 in, Weight: 180 lbs, BMI: 28.2, Temperature: 98.1 F, Pulse: 60 bpm, Respiratory Rate: 18 breaths/min, Blood Pressure: 156/66 mmHg. Respiratory Kim, Salmon [name] ([id number]) normal breathing without difficulty. Psychiatric this patient is able to make decisions and demonstrates good insight into disease process. Alert and Oriented x 3. pleasant and cooperative. General Notes:  Patient's wound bed showed signs of excellent granulation in fact even on the muscle underneath she shows a lot of new skin and granulation around the edges of her wound as well as in the base of the wound over top of the muscle. I am very pleased and hopeful that this will continue to show signs of improvement. No sharp debridement was necessary today. Integumentary (Hair, Skin) Wound #1 status is Open. Original cause of wound was Trauma. The wound is  located on the Left,Lateral Lower Leg. The wound measures 3cm length x 2.7cm width x 0.4cm depth; 6.362cm^2 area and 2.545cm^3 volume. There is muscle and Fat Layer (Subcutaneous Tissue) Exposed exposed. There is undermining starting at 10:00 and ending at 7:00 with a maximum distance of 1.2cm. There is a medium amount of serous drainage noted. The wound margin is flat and intact. There is small (1-33%) red granulation within the wound bed. There is a large (67-100%) amount of necrotic tissue within the wound bed including Adherent Slough. Wound #2 status is Healed - Epithelialized. Original cause of wound was Trauma. The wound is located on the Right,Lateral Lower Leg. The wound measures 0cm length x 0cm width x 0cm depth; 0cm^2 area and 0cm^3 volume. There is Fat Layer (Subcutaneous Tissue) Exposed exposed. There is a medium amount of serous drainage noted. The wound margin is fibrotic, thickened scar. There is no granulation within the wound bed. There is no necrotic tissue within the wound bed. 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 There was a Three Layer Compression Therapy Procedure with a pre-treatment ABI of 0.9 by Army Melia, RN. Post procedure Diagnosis Wound #1: Same as Pre-Procedure Kim, [name] ([id number]) Plan Wound Cleansing: Wound #1 Left,Lateral Lower Leg: Cleanse wound with mild soap and water May shower with protection. - Please do not get your wraps wet Primary Wound Dressing: Silver Alginate Collagen - to muscle exposed area Secondary Dressing: Wound #1 Left,Lateral Lower Leg: ABD pad Dressing Change Frequency: Wound #1 Left,Lateral Lower Leg: Change dressing every week Follow-up Appointments: Wound #1 Left,Lateral Lower Leg: Return Appointment in 1 week. Edema Control: Wound #1 Left,Lateral Lower Leg: 3 Layer Compression System - Left Lower Extremity 1. My suggestion at this time is can be that we continue with the current wound care measures which includes the collagen into the base of the wound and then silver alginate over top. We will also put some of the collagen underneath the undermining regions. 2. I am also going to suggest that we continue with the 3 layer compression wrap I do believe this is been very beneficial for her. We will see patient back for reevaluation in 1 week here in the clinic. If anything worsens or changes patient will contact our office for additional recommendations. Electronic Signature(s) Signed: 08/02/2019 4:48:17 PM By: Worthy Keeler PA-C Entered By: Worthy Keeler on 08/02/2019 16:48:17 [name] ([id number]) -------------------------------------------------------------------------------- SuperBill Details Patient Name: Kim, [name] Date of Service:  08/02/2019 Medical Record Number: [id number] Patient Account Number: [id number] Date of Birth/Sex: 02-Jan-1941 (79 y.o. 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  G29.528 Non-pressure chronic ulcer of other part of right lower leg with fat layer exposed I48.0 Paroxysmal atrial fibrillation I10 Essential (primary) hypertension I25.10 Atherosclerotic heart disease of native coronary artery without angina pectoris I73.89 Other specified peripheral vascular diseases F17.218 Nicotine dependence, cigarettes, with other nicotine-induced disorders Facility Procedures CPT4 Code: [id number] Description: (Facility Use Only) [phone] - [place] LWR LT LEG Modifier: Quantity: 1 Physician Procedures CPT4: Description Modifier Quantity Code [id number] - WC PHYS LEVEL 3 - EST PT 1 ICD-10 Diagnosis Description S81.802A Unspecified open wound, left lower leg, initial encounter S81.801A Unspecified open wound, right lower leg, initial encounter  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

## 2019-08-06 ENCOUNTER — Ambulatory Visit: Admission: RE | Admit: 2019-08-06 | Payer: Medicare HMO | Source: Ambulatory Visit

## 2019-08-07 ENCOUNTER — Ambulatory Visit
Admission: RE | Admit: 2019-08-07 | Discharge: 2019-08-07 | Disposition: A | Discharge status: Home / Self Care | Payer: Medicare HMO | Source: Ambulatory Visit | Attending: Nephrology | Admitting: Nephrology

## 2019-08-07 ENCOUNTER — Other Ambulatory Visit: Payer: Self-pay

## 2019-08-07 ENCOUNTER — Encounter: Payer: Medicare HMO | Attending: Physician Assistant | Admitting: Physician Assistant

## 2019-08-07 DIAGNOSIS — I2510 Atherosclerotic heart disease of native coronary artery without angina pectoris: Secondary | ICD-10-CM | POA: Insufficient documentation

## 2019-08-07 DIAGNOSIS — N184 Chronic kidney disease, stage 4 (severe): Secondary | ICD-10-CM | POA: Diagnosis not present

## 2019-08-07 DIAGNOSIS — S81802A Unspecified open wound, left lower leg, initial encounter: Secondary | ICD-10-CM | POA: Insufficient documentation

## 2019-08-07 DIAGNOSIS — I10 Essential (primary) hypertension: Secondary | ICD-10-CM | POA: Diagnosis not present

## 2019-08-07 DIAGNOSIS — Z881 Allergy status to other antibiotic agents status: Secondary | ICD-10-CM | POA: Insufficient documentation

## 2019-08-07 DIAGNOSIS — Z888 Allergy status to other drugs, medicaments and biological substances status: Secondary | ICD-10-CM | POA: Diagnosis not present

## 2019-08-07 DIAGNOSIS — Z882 Allergy status to sulfonamides status: Secondary | ICD-10-CM | POA: Insufficient documentation

## 2019-08-07 DIAGNOSIS — F17210 Nicotine dependence, cigarettes, uncomplicated: Secondary | ICD-10-CM | POA: Insufficient documentation

## 2019-08-07 DIAGNOSIS — S80811A Abrasion, right lower leg, initial encounter: Secondary | ICD-10-CM | POA: Diagnosis not present

## 2019-08-07 DIAGNOSIS — L97812 Non-pressure chronic ulcer of other part of right lower leg with fat layer exposed: Secondary | ICD-10-CM | POA: Diagnosis present

## 2019-08-07 DIAGNOSIS — Z880 Allergy status to penicillin: Secondary | ICD-10-CM | POA: Insufficient documentation

## 2019-08-07 DIAGNOSIS — D631 Anemia in chronic kidney disease: Secondary | ICD-10-CM | POA: Diagnosis not present

## 2019-08-07 DIAGNOSIS — L97829 Non-pressure chronic ulcer of other part of left lower leg with unspecified severity: Secondary | ICD-10-CM | POA: Diagnosis not present

## 2019-08-07 DIAGNOSIS — Z885 Allergy status to narcotic agent status: Secondary | ICD-10-CM | POA: Diagnosis not present

## 2019-08-07 DIAGNOSIS — J449 Chronic obstructive pulmonary disease, unspecified: Secondary | ICD-10-CM | POA: Diagnosis not present

## 2019-08-07 DIAGNOSIS — I739 Peripheral vascular disease, unspecified: Secondary | ICD-10-CM | POA: Insufficient documentation

## 2019-08-07 DIAGNOSIS — I252 Old myocardial infarction: Secondary | ICD-10-CM | POA: Diagnosis not present

## 2019-08-07 DIAGNOSIS — I480 Paroxysmal atrial fibrillation: Secondary | ICD-10-CM | POA: Insufficient documentation

## 2019-08-07 LAB — HEMOGLOBIN: Hemoglobin: 13 g/dL (ref 12.0–15.0)

## 2019-08-07 LAB — IRON AND TIBC
Iron: 89 ug/dL (ref 28–170)
Saturation Ratios: 31 % (ref 10.4–31.8)
TIBC: 286 ug/dL (ref 250–450)
UIBC: 197 ug/dL

## 2019-08-07 LAB — FERRITIN: Ferritin: 391 ng/mL — ABNORMAL HIGH (ref 11–307)

## 2019-08-07 LAB — TRANSFERRIN: Transferrin: 203 mg/dL (ref 192–382)

## 2019-08-07 NOTE — Progress Notes (Addendum)
[name] ([id number]) Visit Report for 08/07/2019 Chief Complaint Document Details Patient Name: [name], [name]. Date of Service: 08/07/2019 11:00 AM Medical Record Number: [id number] Patient Account Number: [id number] Date of Birth/Sex: 1941-02-05 (78 y.o. F) Treating RN: Montey Hora Primary Care Provider: Lemmie Evens Other Clinician: Referring Provider: Lemmie Evens Treating Provider/Extender: Melburn Hake, Geremiah Fussell Weeks in Treatment: 4 Information Obtained from: Patient Chief Complaint Bilateral LE ulcers Electronic Signature(s) Signed: 08/07/2019 11:18:42 AM By: Worthy Keeler PA-C Entered By: Worthy Keeler on 08/07/2019 11:18:42 Delbene, [name] ([id number]) -------------------------------------------------------------------------------- Debridement Details Patient Name: [name] Date of Service: 08/07/2019 11:00 AM Medical Record Number: [id number] Patient Account Number: [id number] Date of Birth/Sex: January 24, 1941 (78 y.o. F) Treating RN: Montey Hora Primary Care Provider: Lemmie Evens Other Clinician: Referring Provider: Lemmie Evens Treating Provider/Extender: Melburn Hake, Jastin Fore Weeks in Treatment: 4 Debridement Performed for Wound #1 Left,Lateral Lower Leg Assessment: Performed By: Physician STONE III, Virgie Kunda E., PA-C Debridement Type: Debridement Level of Consciousness (Pre- Awake and Alert procedure): Pre-procedure Verification/Time Yes - 11:20 Out Taken: Start Time: 11:20 Pain Control: Lidocaine 4% Topical Solution Total Area Debrided (L x W): 2.5 (cm) x 1.3 (cm) = 3.25 (cm) Tissue and other material Viable, Non-Viable, Slough, Subcutaneous, Slough debrided: Level: Skin/Subcutaneous Tissue Debridement Description: Excisional Instrument: Curette Bleeding: Minimum Hemostasis Achieved: Pressure End Time: 11:23 Procedural Pain: 0 Post Procedural Pain: 0 Response to Treatment: Procedure was tolerated well Level of Consciousness Awake and  Alert (Post-procedure): Post Debridement Measurements of Total Wound Length: (cm) 2.5 Width: (cm) 1.3 Depth: (cm) 1.1 Volume: (cm) 2.808 Character of Wound/Ulcer Post Debridement: Improved Post Procedure Diagnosis Same as Pre-procedure Electronic Signature(s) Signed: 08/07/2019 4:55:53 PM By: Montey Hora Signed: 08/08/2019 1:07:50 AM By: Worthy Keeler PA-C Entered By: Montey Hora on 08/07/2019 11:22:20 Craun, [name] ([id number]) -------------------------------------------------------------------------------- HPI Details Patient Name: [name]. Date of Service: 08/07/2019 11:00 AM Medical Record Number: [id number] Patient Account Number: [id number] Date of Birth/Sex: July 19, 1940 (78 y.o. F) Treating RN: Montey Hora Primary Care Provider: Lemmie Evens Other Clinician: Referring Provider: Lemmie Evens Treating Provider/Extender: Melburn Hake, Luzmaria Devaux Weeks in Treatment: 4 History of Present Illness HPI Description: 07/10/2019 on evaluation today patient presents for initial inspection here in our clinic concerning wounds that she sustained as a result of traumatic injuries to her left lower leg as well as her right lower leg. The right side is much less severe the left side actually has muscle exposed. This left side she actually had in a car door and I think it did tear into her quite a bit more than the trauma wound which was more of an abrasion on the right lower extremity. Nonetheless she is having a lot of drainage from the left. This is the one obviously that is deeper down to the muscle area. With that being said the patient also has paroxysmal atrial fibrillation, hypertension, coronary artery disease, peripheral vascular disease, and nicotine dependence. Currently she is not having a lot of severe pain although there is some discomfort. No fevers, chills, nausea, vomiting, or diarrhea. Patient did have arterial studies performed on 05/23/2019 which revealed a left  ABI of 1.08 and a right ABI of 0.95. 07/17/2019 on evaluation today patient appears to be doing quite well with regard to her wounds. She has been showing signs of actually dramatic improvement in just 1 week in my opinion. In fact even the area where the muscle is exposed is showing already good signs of new granulation tissue over top  of the muscle which is exactly what we want to see. Overall I am very pleased at this point. 07/24/2019 upon evaluation today patient appears to be doing well with regard to her wounds. In fact the right lower extremity ulcer appears to be almost completely healed which is great news some of the dressing material stuck to the periwound region. In regard to the left lower extremity that is showing signs of improvement at slow but nonetheless is still making improvement at this point. 08/02/2019 upon evaluation today patient appears to be doing well with regard to her wounds. The right leg ulcer is actually completely healed the left leg ulcer though not healed is doing significantly better. I am very pleased with how things seem to be progressing. Especially in light of how deep this wound was initially. 08/07/19 upon evaluation today patient actually appears to be showing signs of good improvement which is excellent news. There does not appear to be any evidence of active infection which is also excellent news. Overall I'm pleased with the healing prospects currently. I do believe that this is going to continue to take some time but nonetheless I do feel like she's also making excellent improvement especially considering the depth and extent of her wound initially. Electronic Signature(s) Signed: 08/08/2019 1:07:50 AM By: Worthy Keeler PA-C Entered By: Worthy Keeler on 08/08/2019 00:01:08 [name] ([id number]) -------------------------------------------------------------------------------- Physical Exam Details Patient Name: [name]. Date of  Service: 08/07/2019 11:00 AM Medical Record Number: [id number] Patient Account Number: [id number] Date of Birth/Sex: 03/24/1941 (78 y.o. F) Treating RN: Montey Hora Primary Care Provider: Lemmie Evens Other Clinician: Referring Provider: Lemmie Evens Treating Provider/Extender: STONE III, Charlott Calvario Weeks in Treatment: 4 Constitutional Well-nourished and well-hydrated in no acute distress. Respiratory normal breathing without difficulty. Psychiatric this patient is able to make decisions and demonstrates good insight into disease process. Alert and Oriented x 3. pleasant and cooperative. Notes Patient's wound bed currently showed good granulation and epithelial tissue noted today. As far as the granulation tissue was concerned she has significant granulating end of the wound bed itself that's doing great for her and I'm very pleased in this regard. I do believe the silver collagen is still the best way to go. Sharp debridement was performed to remove some Slough from the surface of the wound and the good subcutaneous tissue she tolerated this without complication. Electronic Signature(s) Signed: 08/08/2019 1:07:50 AM By: Worthy Keeler PA-C Entered By: Worthy Keeler on 08/08/2019 00:01:53 [name] ([id number]) -------------------------------------------------------------------------------- Physician Orders Details Patient Name: THATIANA, RENBARGER Date of Service: 08/07/2019 11:00 AM Medical Record Number: [id number] Patient Account Number: [id number] Date of Birth/Sex: 1940/07/15 (78 y.o. 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 - Please do not get your wraps wet Primary Wound Dressing o Silver Alginate o Collagen - to muscle exposed area Secondary Dressing Wound #1 Left,Lateral Lower Leg o ABD pad Dressing Change Frequency Wound #1 Left,Lateral Lower Leg o Change dressing every week Follow-up Appointments Wound #1 Left,Lateral Lower Leg o Return Appointment in 1 week. Edema Control Wound #1 Left,Lateral Lower Leg o 3 Layer Compression System - Left Lower Extremity Electronic Signature(s) ALAINE, LOUGHNEY ([id number]) Signed: 08/07/2019 4:55:53 PM By: Montey Hora Signed: 08/08/2019 1:07:50 AM By: Worthy Keeler PA-C Entered By: Montey Hora on 08/07/2019 11:22:47 [place], [name] ([id number]) -------------------------------------------------------------------------------- Problem List Details Patient Name: SEAIRRA, OTANI. Date of Service: 08/07/2019 11:00 AM Medical Record Number: [id number] Patient Account Number: [id number] Date of Birth/Sex: December 29, 1940 (78 y.o. F) Treating RN: Montey Hora Primary Care Provider: Lemmie Evens Other Clinician: Referring Provider: Lemmie Evens Treating Provider/Extender: Melburn Hake, Rosealee Recinos Weeks in Treatment: 4 Active Problems ICD-10 Evaluated Encounter Code Description Active Date Today Diagnosis S81.802A Unspecified  open wound, left lower leg, initial encounter 07/10/2019 No Yes S81.801A Unspecified open wound, right lower leg, initial encounter 07/10/2019 No Yes L97.825 Non-pressure chronic ulcer of other part of left lower leg with 07/10/2019 No Yes muscle involvement without evidence of necrosis L97.812 Non-pressure chronic ulcer of other part of right lower leg 07/10/2019 No Yes with fat layer exposed I48.0 Paroxysmal atrial fibrillation 07/10/2019 No Yes I10 Essential (primary) hypertension 07/10/2019 No Yes I25.10 Atherosclerotic heart disease of native coronary artery 07/10/2019 No Yes without angina pectoris I73.89 Other specified peripheral vascular diseases 07/10/2019 No Yes F17.218 Nicotine dependence, cigarettes, with other nicotine-induced 07/10/2019 No Yes disorders KAMESHIA, MADRUGA ([id number]) Inactive Problems Resolved Problems Electronic Signature(s) Signed: 08/07/2019 11:18:26 AM By: Worthy Keeler PA-C Entered By: Worthy Keeler on 08/07/2019 11:18:25 Bashor, [name] ([id number]) -------------------------------------------------------------------------------- Progress Note Details Patient Name: [name] Date of Service: 08/07/2019 11:00 AM Medical Record Number: [id number] Patient Account Number: [id number] Date of Birth/Sex: 1940-09-18 (78 y.o. F) Treating RN: Montey Hora Primary Care Provider: Lemmie Evens Other Clinician: Referring Provider: Lemmie Evens Treating Provider/Extender: Melburn Hake, Raford Brissett Weeks in Treatment: 4 Subjective Chief Complaint Information obtained from Patient Bilateral LE ulcers History of Present Illness (HPI) 07/10/2019 on evaluation today patient presents for initial inspection here in our clinic concerning wounds that she sustained as a result of traumatic injuries to her left lower leg as well as her right lower leg. The right side is much less severe the left side actually has muscle exposed. This left side she actually had in a car door and I think  it did tear into her quite a bit more than the trauma wound which was more of an abrasion on the right lower extremity. Nonetheless she is having a lot of drainage from the left. This is the one obviously that is deeper down to the muscle area. With that being said the patient also has paroxysmal atrial fibrillation, hypertension, coronary artery disease, peripheral vascular disease, and nicotine dependence. Currently she is not having a lot of severe pain although there is some discomfort. No fevers, chills, nausea, vomiting, or diarrhea. Patient did have arterial studies performed on 05/23/2019 which revealed a left ABI of 1.08 and a right ABI of 0.95. 07/17/2019 on evaluation today patient appears to be doing quite well with regard to her wounds. She has been showing signs of actually dramatic improvement in just 1 week in my opinion. In fact even the area where  the muscle is exposed is showing already good signs of new granulation tissue over top of the muscle which is exactly what we want to see. Overall I am very pleased at this point. 07/24/2019 upon evaluation today patient appears to be doing well with regard to her wounds. In fact the right lower extremity ulcer appears to be almost completely healed which is great news some of the dressing material stuck to the periwound region. In regard to the left lower extremity that is showing signs of improvement at slow but nonetheless is still making improvement at this point. 08/02/2019 upon evaluation today patient appears to be doing well with regard to her wounds. The right leg ulcer is actually completely healed the left leg ulcer though not healed is doing significantly better. I am very pleased with how things seem to be progressing. Especially in light of how deep this wound was initially. 08/07/19 upon evaluation today patient actually appears to be showing signs of good improvement which is excellent news. There does not appear to be any  evidence of active infection which is also excellent news. Overall I'm pleased with the healing prospects currently. I do believe that this is going to continue to take some time but nonetheless I do feel like she's also making excellent improvement especially considering the depth and extent of her wound initially. Objective Constitutional Well-nourished and well-hydrated in no acute distress. ASHLEE, PLAYER ([id number]) Vitals Time Taken: 11:00 AM, Height: 67 in, Weight: 180 lbs, BMI: 28.2, Temperature: 98.1 F, Pulse: 58 bpm, Respiratory Rate: 18 breaths/min, Blood Pressure: 151/60 mmHg. Respiratory normal breathing without difficulty. Psychiatric this patient is able to make decisions and demonstrates good insight into disease process. Alert and Oriented x 3. pleasant and cooperative. General Notes: Patient's wound bed currently showed good granulation and epithelial tissue noted today. As far as the granulation tissue was concerned she has significant granulating end of the wound bed itself that's doing great for her and I'm very pleased in this regard. I do believe the silver collagen is still the best way to go. Sharp debridement was performed to remove some Slough from the surface of the wound and the good subcutaneous tissue she tolerated this without complication. Integumentary (Hair, Skin) Wound #1 status is Open. Original cause of wound was Trauma. The wound is located on the Left,Lateral Lower Leg. The wound measures 2.5cm length x 1.3cm width x 0.9cm depth; 2.553cm^2 area and 2.297cm^3 volume. There is muscle and Fat Layer (Subcutaneous Tissue) Exposed exposed. There is no tunneling noted, however, there is undermining starting at 10:00 and ending at 5:00 with a maximum distance of 1.3cm. There is a medium amount of serous drainage noted. The wound margin is flat and intact. There is large (67-100%) red granulation within the wound bed. There is a small (1-33%) amount of  necrotic tissue within the wound bed including Adherent Slough. 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 There was a Excisional Skin/Subcutaneous Tissue Debridement  with a total area of 3.25 sq cm performed by STONE III, Cipriana Biller E., PA-C. With the following instrument(s): Curette to remove Viable and Non-Viable tissue/material. Material removed includes Subcutaneous Tissue and Slough and after achieving pain control using Lidocaine 4% Topical Solution. No specimens were taken. A time out was conducted at 11:20, prior to the start of the procedure. A Minimum amount of bleeding was controlled with Pressure. The procedure was tolerated well with a pain level of 0 throughout and a pain level of 0 following the procedure. Post MAZI, SCHUFF ([id number]) Debridement Measurements: 2.5cm length x 1.3cm width x 1.1cm depth; 2.808cm^3 volume. Character of Wound/Ulcer Post Debridement is improved. Post procedure Diagnosis Wound #1: Same as Pre-Procedure Pre-procedure diagnosis of Wound #1 is a Trauma, Other located on the Left,Lateral Lower Leg . There was a Three Layer Compression Therapy Procedure with a pre-treatment ABI of 1.1 by Montey Hora, RN. Post procedure Diagnosis Wound #1: Same as Pre-Procedure Plan Wound  Cleansing: Wound #1 Left,Lateral Lower Leg: Cleanse wound with mild soap and water May shower with protection. - Please do not get your wraps wet Primary Wound Dressing: Silver Alginate Collagen - to muscle exposed area Secondary Dressing: Wound #1 Left,Lateral Lower Leg: ABD pad Dressing Change Frequency: Wound #1 Left,Lateral Lower Leg: Change dressing every week Follow-up Appointments: Wound #1 Left,Lateral Lower Leg: Return Appointment in 1 week. Edema Control: Wound #1 Left,Lateral Lower Leg: 3 Layer Compression System - Left Lower Extremity 1. Upon evaluation today patient's wound actually showed signs of good improvement I would recommend that we continue with the collagen into the base of the wound followed by our treatment over top of the wound to help with absorption. 2. I would recommend as well that we continue with the three layer compression wrap I do believe this is helping to control her edema quite well and seems to be tolerating that. Please see above for specific wound care orders. We will see patient for re-evaluation in 1 week(s) here in the clinic. If anything worsens or changes patient will contact our office for additional recommendations. Electronic Signature(s) Signed: 08/08/2019 1:07:50 AM By: Worthy Keeler PA-C Entered By: Worthy Keeler on 08/08/2019 00:07:20 [name] ([id number]) -------------------------------------------------------------------------------- SuperBill Details Patient Name: ARNELLA, PRALLE Date of Service: 08/07/2019 Medical Record Number: [id number] Patient Account Number: [id number] Date of Birth/Sex: 1940/11/13 (79 y.o. 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 L97.812 Non-pressure chronic ulcer of other part of right lower leg with fat layer exposed I48.0 Paroxysmal atrial fibrillation I10 Essential (primary) hypertension I25.10 Atherosclerotic heart disease of native coronary artery without angina pectoris I73.89 Other specified peripheral vascular diseases F17.218 Nicotine dependence, cigarettes, with other nicotine-induced disorders Facility Procedures CPT4: Description Modifier Quantity Code 08657846 11042 - DEB SUBQ TISSUE 20 SQ CM/< 1 ICD-10 Diagnosis Description 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

## 2019-08-07 NOTE — Progress Notes (Signed)
[name], [name] ([id number]) Visit Report for 08/07/2019 Arrival Information Details Patient Name: [name], [name]. Date of Service: 08/07/2019 11:00 AM Medical Record Number: [id number] Patient Account Number: [id number] Date of Birth/Sex: 08/17/1940 (79 y.o. F) Treating RN: Montey Hora Primary Care Daiveon Markman: Lemmie Evens Other Clinician: Referring Marcelo Ickes: Lemmie Evens Treating Lynzi Meulemans/Extender: Melburn Hake, HOYT Weeks in Treatment: 4 Visit Information History Since Last Visit Added or deleted any medications: No Patient Arrived: Walker Any new allergies or adverse reactions: No Arrival Time: 10:58 Had a fall or experienced change in No Accompanied By: self activities of daily living that may affect Transfer Assistance: None risk of falls: Patient Has Alerts: Yes Signs or symptoms of abuse/neglect since last visito No Patient Alerts: ABI 05/23/2019 Hospitalized since last visit: No (L)1.08 (R) 0.95 Implantable device outside of the clinic excluding No TBI (L) 0.92 (R) 0.87 cellular tissue based products placed in the center since last visit: Has Dressing in Place as Prescribed: Yes Has Compression in Place as Prescribed: Yes Pain Present Now: No Electronic Signature(s) Signed: 08/07/2019 1:33:31 PM By: Lorine Bears RCP, RRT, CHT Entered By: Lorine Bears on 08/07/2019 11:01:11 [name], [name] ([id number]) -------------------------------------------------------------------------------- Compression Therapy Details Patient Name: [name] Date of Service: 08/07/2019 11:00 AM Medical Record Number: [id number] Patient Account Number: [id number] Date of Birth/Sex: April 09, 1941 (79 y.o. F) Treating RN: Montey Hora Primary Care Antonius Hartlage: Lemmie Evens Other Clinician: Referring Shuntavia Yerby: Lemmie Evens Treating Nameer Summer/Extender: Melburn Hake, HOYT Weeks in Treatment: 4 Compression Therapy Performed for Wound Assessment: Wound #1  Left,Lateral Lower Leg Performed By: Clinician Montey Hora, RN Compression Type: Three Layer Pre Treatment ABI: 1.1 Post Procedure Diagnosis Same as Pre-procedure Electronic Signature(s) Signed: 08/07/2019 4:55:53 PM By: Montey Hora Entered By: Montey Hora on 08/07/2019 11:23:16 [name], [name] ([id number]) -------------------------------------------------------------------------------- Encounter Discharge Information Details Patient Name: [name], [name]. Date of Service: 08/07/2019 11:00 AM Medical Record Number: [id number] Patient Account Number: [id number] Date of Birth/Sex: Sep 16, 1940 (79 y.o. F) Treating RN: Montey Hora Primary Care Halsey Hammen: Lemmie Evens Other Clinician: Referring Orval Dortch: Lemmie Evens Treating Tricia Pledger/Extender: Melburn Hake, HOYT Weeks in Treatment: 4 Encounter Discharge Information Items Post Procedure Vitals Discharge Condition: Stable Temperature (F): 98.1 Ambulatory Status: Walker Pulse (bpm): 58 Discharge Destination: Home Respiratory Rate (breaths/min): 16 Transportation: Private Auto Blood Pressure (mmHg): 151/60 Accompanied By: self Schedule Follow-up Appointment: Yes Clinical Summary of Care: Electronic Signature(s) Signed: 08/07/2019 4:55:53 PM By: Montey Hora Entered By: Montey Hora on 08/07/2019 11:25:03 [name], [name] ([id number]) -------------------------------------------------------------------------------- Lower Extremity Assessment Details Patient Name: [name]. Date of Service: 08/07/2019 11:00 AM Medical Record Number: [id number] Patient Account Number: [id number] Date of Birth/Sex: 22-Nov-1940 (79 y.o. F) Treating RN: Army Melia Primary Care Charleigh Correnti: Lemmie Evens Other Clinician: Referring Lacrystal Barbe: Lemmie Evens Treating Dontel Harshberger/Extender: STONE III, HOYT Weeks in Treatment: 4 Edema Assessment Assessed: [Left: No] [Right: No] Edema: [Left: N] [Right: o] Calf Left: Right: Point of  Measurement: 30 cm From Medial Instep 41 cm cm Ankle Left: Right: Point of Measurement: 10 cm From Medial Instep 20 cm cm Vascular Assessment Pulses: Dorsalis Pedis Palpable: [Left:Yes] Electronic Signature(s) Signed: 08/07/2019 11:23:06 AM By: Army Melia Entered By: Army Melia on 08/07/2019 11:06:28 [name], [name] ([id number]) -------------------------------------------------------------------------------- Multi Wound Chart Details Patient Name: [name]. Date of Service: 08/07/2019 11:00 AM Medical Record Number: [id number] Patient Account Number: [id number] Date of Birth/Sex: 30-Jan-1941 (79 y.o. F) Treating RN: Montey Hora Primary Care Deirdre Gryder: Lemmie Evens Other Clinician: Referring Xylah Early: Lemmie Evens Treating Delaine Hernandez/Extender: Melburn Hake,  HOYT Weeks in Treatment: 4 Vital Signs Height(in): 67 Pulse(bpm): 58 Weight(lbs): 180 Blood Pressure(mmHg): 151/60 Body Mass Index(BMI): 28 Temperature(F): 98.1 Respiratory Rate 18 (breaths/min): Photos: [N/A:N/A] Wound Location: Left, Lateral Lower Leg N/A N/A Wounding Event: Trauma N/A N/A Primary Etiology: Trauma, Other N/A N/A Comorbid History: Cataracts, Asthma, Chronic N/A N/A Obstructive Pulmonary Disease (COPD), Arrhythmia, Coronary Artery Disease, Hypertension, Hypotension, Myocardial Infarction, Peripheral Arterial Disease Date Acquired: 06/18/2019 N/A N/A Weeks of Treatment: 4 N/A N/A Wound Status: Open N/A N/A Measurements L x W x D 2.5x1.3x0.9 N/A N/A (cm) Area (cm) : 2.553 N/A N/A Volume (cm) : 2.297 N/A N/A % Reduction in Area: 79.00% N/A N/A % Reduction in Volume: 62.30% N/A N/A Starting Position 1 10 (o'clock): Ending Position 1 5 (o'clock): Maximum Distance 1 (cm): 1.3 Undermining: Yes N/A N/A Classification: Full Thickness With Exposed N/A N/A Support Structures Exudate Amount: Medium N/A N/A Exudate Type: Serous N/A N/A [name], [name] ([id number]) Exudate Color: amber  N/A N/A Wound Margin: Flat and Intact N/A N/A Granulation Amount: Large (67-100%) N/A N/A Granulation Quality: Red N/A N/A Necrotic Amount: Small (1-33%) N/A N/A Exposed Structures: Fat Layer (Subcutaneous N/A N/A Tissue) Exposed: Yes Muscle: Yes Fascia: No Tendon: No Joint: No Bone: No Epithelialization: None N/A N/A Treatment Notes Electronic Signature(s) Signed: 08/07/2019 4:55:53 PM By: Montey Hora Entered By: Montey Hora on 08/07/2019 11:19:46 Giacobbe, [name] ([id number]) -------------------------------------------------------------------------------- [place] Details Patient Name: [name], [name]. Date of Service: 08/07/2019 11:00 AM Medical Record Number: [id number] Patient Account Number: [id number] Date of Birth/Sex: 04/26/1941 (78 y.o. F) Treating RN: Montey Hora Primary Care Aribelle Mccosh: Lemmie Evens Other Clinician: Referring Mahealani Sulak: Lemmie Evens Treating Jayzon Taras/Extender: Melburn Hake, HOYT Weeks in Treatment: 4 Active Inactive Abuse / Safety / Falls / Self Care Management Nursing Diagnoses: Potential for falls Goals: Patient will not experience any injury related to falls Date Initiated: 07/10/2019 Target Resolution Date: 10/13/2019 Goal Status: Active Interventions: Assess fall risk on admission and as needed Notes: Necrotic Tissue Nursing Diagnoses: Impaired tissue integrity related to necrotic/devitalized tissue Goals: Necrotic/devitalized tissue will be minimized in the wound bed Date Initiated: 07/10/2019 Target Resolution Date: 10/13/2019 Goal Status: Active Interventions: Provide education on necrotic tissue and debridement process Notes: Orientation to the Wound Care Program Nursing Diagnoses: Knowledge deficit related to the wound healing center program Goals: Patient/caregiver will verbalize understanding of the [place] Program Date Initiated: 07/10/2019 Target Resolution Date: 10/13/2019 Goal Status:  Active Interventions: Provide education on orientation to the wound center [name], Kim C. ([id number]) Notes: Wound/Skin Impairment Nursing Diagnoses: Impaired tissue integrity Goals: Ulcer/skin breakdown will heal within 14 weeks Date Initiated: 07/10/2019 Target Resolution Date: 10/13/2019 Goal Status: Active Interventions: Assess patient/caregiver ability to obtain necessary supplies Assess patient/caregiver ability to perform ulcer/skin care regimen upon admission and as needed Assess ulceration(s) every visit Notes: Electronic Signature(s) Signed: 08/07/2019 4:55:53 PM By: Montey Hora Entered By: Montey Hora on 08/07/2019 11:19:39 Maldonado, [name] ([id number]) -------------------------------------------------------------------------------- Pain Assessment Details Patient Name: [name]. Date of Service: 08/07/2019 11:00 AM Medical Record Number: [id number] Patient Account Number: [id number] Date of Birth/Sex: 01/03/1941 (78 y.o. F) Treating RN: Montey Hora Primary Care Tanji Storrs: Lemmie Evens Other Clinician: Referring Layth Cerezo: Lemmie Evens Treating Eon Zunker/Extender: Melburn Hake, HOYT Weeks in Treatment: 4 Active Problems Location of Pain Severity and Description of Pain Patient Has Paino No Site Locations Pain Management and Medication Current Pain Management: Electronic Signature(s) Signed: 08/07/2019 1:33:31 PM By: Paulla Fore, RRT, CHT Signed: 08/07/2019 4:55:53 PM By:  Dorthy, Di Kindle Entered By: Lorine Bears on 08/07/2019 11:01:19 [name] ([id number]) -------------------------------------------------------------------------------- Patient/Caregiver Education Details Patient Name: [name], [name] Date of Service: 08/07/2019 11:00 AM Medical Record Number: [id number] Patient Account Number: [id number] Date of Birth/Gender: [birth year] (78 y.o. F) Treating RN: Montey Hora Primary Care Physician: Lemmie Evens Other Clinician: Referring Physician: Lemmie Evens Treating Physician/Extender: Sharalyn Ink in Treatment: 4 Education Assessment Education Provided To: Patient Education Topics Provided Venous: Handouts: Other: leg elevation Methods: Explain/Verbal Responses: State content correctly Electronic Signature(s) Signed: 08/07/2019 4:55:53 PM By: Montey Hora Entered By: Montey Hora on 08/07/2019 11:23:37 Faust, [name] ([id number]) -------------------------------------------------------------------------------- Wound Assessment Details Patient Name: [name]. Date of Service: 08/07/2019 11:00 AM Medical Record Number: [id number] Patient Account Number: [id number] Date of Birth/Sex: 1941/05/15 (78 y.o. F) Treating RN: Army Melia Primary Care Eadie Repetto: Lemmie Evens Other Clinician: Referring Duvid Smalls: Lemmie Evens Treating Marsi Turvey/Extender: STONE III, HOYT Weeks in Treatment: 4 Wound Status Wound Number: 1 Primary Trauma, Other Etiology: Wound Location: Left, Lateral Lower Leg Wound Open Wounding Event: Trauma Status: Date Acquired: 06/18/2019 Comorbid Cataracts, Asthma, Chronic Obstructive Weeks Of Treatment: 4 History: Pulmonary Disease (COPD), Arrhythmia, Clustered Wound: No Coronary Artery Disease, Hypertension, Hypotension, Myocardial Infarction, Peripheral Arterial Disease Photos Wound Measurements Length: (cm) 2.5 % Reduction Width: (cm) 1.3 % Reduction Depth: (cm) 0.9 Epitheliali Area: (cm) 2.553 Tunneling: Volume: (cm) 2.297 Underminin Starting Ending P Maximum in Area: 79% in Volume: 62.3% zation: None No g: Yes Position (o'clock): 10 osition (o'clock): 5 Distance: (cm) 1.3 Wound Description Full Thickness With Exposed Support Foul Odor A Classification: Structures Slough/Fibr Wound Margin: Flat and Intact Exudate Medium Amount: Exudate Type: Serous Exudate Color: amber fter Cleansing: No ino Yes Wound  Bed Granulation Amount: Large (67-100%) Exposed Structure Granulation Quality: Red Fascia Exposed: No [name], Kim C. ([id number]) Necrotic Amount: Small (1-33%) Fat Layer (Subcutaneous Tissue) Exposed: Yes Necrotic Quality: Adherent Slough Tendon Exposed: No Muscle Exposed: Yes Necrosis of Muscle: No Joint Exposed: No Bone Exposed: No Treatment Notes Wound #1 (Left, Lateral Lower Leg) Notes promogran with silvercel on top, ABD, 3 layer with unna to anchor Electronic Signature(s) Signed: 08/07/2019 11:23:06 AM By: Army Melia Entered By: Army Melia on 08/07/2019 11:05:26 [name], [name] ([id number]) -------------------------------------------------------------------------------- Vitals Details Patient Name: [name] Date of Service: 08/07/2019 11:00 AM Medical Record Number: [id number] Patient Account Number: [id number] Date of Birth/Sex: 26-Aug-1940 (78 y.o. F) Treating RN: Montey Hora Primary Care Indalecio Malmstrom: Lemmie Evens Other Clinician: Referring Yuan Gann: Lemmie Evens Treating Jasa Dundon/Extender: STONE III, HOYT Weeks in Treatment: 4 Vital Signs Time Taken: 11:00 Temperature (F): 98.1 Height (in): 67 Pulse (bpm): 58 Weight (lbs): 180 Respiratory Rate (breaths/min): 18 Body Mass Index (BMI): 28.2 Blood Pressure (mmHg): 151/60 Reference Range: 80 - 120 mg / dl Electronic Signature(s) Signed: 08/07/2019 1:33:31 PM By: Lorine Bears RCP, RRT, CHT Entered By: Lorine Bears on 08/07/2019 11:02:05

## 2019-08-10 ENCOUNTER — Telehealth: Payer: Self-pay | Admitting: Cardiovascular Disease

## 2019-08-10 NOTE — Telephone Encounter (Signed)
Patient calling in stating she has bronchitis. Patient also stated that her PCP is out of the office until August. Patient is wanting a z-pac called in to her pharmacy   Please advise

## 2019-08-11 NOTE — Telephone Encounter (Signed)
Can we call her covering primary care doc and see if they want our office to call in something They may want to see her? She has COPD, high risk of COPD exacerbation

## 2019-08-14 ENCOUNTER — Other Ambulatory Visit: Payer: Self-pay

## 2019-08-14 ENCOUNTER — Encounter: Payer: Medicare HMO | Admitting: Physician Assistant

## 2019-08-14 DIAGNOSIS — L97812 Non-pressure chronic ulcer of other part of right lower leg with fat layer exposed: Secondary | ICD-10-CM | POA: Diagnosis not present

## 2019-08-14 NOTE — Telephone Encounter (Signed)
Spoke with patient and confirmed appointment tomorrow with her PCP doctor for zpack request. She verbalized understanding with no further questions at this time.

## 2019-08-14 NOTE — Progress Notes (Addendum)
Kim, [name] ([id number]) Visit Report for 08/14/2019 Chief Complaint Document Details Patient Name: [name], [name]. Date of Service: 08/14/2019 11:00 AM Medical Record Number: [id number] Patient Account Number: [id number] Date of Birth/Sex: 15-Feb-1941 (79 y.o. F) Treating RN: Montey Hora Primary Care Provider: Lemmie Evens Other Clinician: Referring Provider: Lemmie Evens Treating Provider/Extender: Melburn Hake, Victorina Kable Weeks in Treatment: 5 Information Obtained from: Patient Chief Complaint Bilateral LE ulcers Electronic Signature(s) Signed: 08/14/2019 11:10:03 AM By: Worthy Keeler PA-C Entered By: Worthy Keeler on 08/14/2019 11:10:03 Ahart, [name] ([id number]) -------------------------------------------------------------------------------- HPI Details Patient Name: Kim, [name] Date of Service: 08/14/2019 11:00 AM Medical Record Number: [id number] Patient Account Number: [id number] Date of Birth/Sex: Mar 10, 1941 (79 y.o. F) Treating RN: Montey Hora Primary Care Provider: Lemmie Evens Other Clinician: Referring Provider: Lemmie Evens Treating Provider/Extender: Melburn Hake, Donnita Farina Weeks in Treatment: 5 History of Present Illness HPI Description: 07/10/2019 on evaluation today patient presents for initial inspection here in our clinic concerning wounds that she sustained as a result of traumatic injuries to her left lower leg as well as her right lower leg. The right side is much less severe the left side actually has muscle exposed. This left side she actually had in a car door and I think it did tear into her quite a bit more than the trauma wound which was more of an abrasion on the right lower extremity. Nonetheless she is having a lot of drainage from the left. This is the one obviously that is deeper down to the muscle area. With that being said the patient also has paroxysmal atrial fibrillation, hypertension, coronary artery disease, peripheral vascular disease,  and nicotine dependence. Currently she is not having a lot of severe pain although there is some discomfort. No fevers, chills, nausea, vomiting, or diarrhea. Patient did have arterial studies performed on 05/23/2019 which revealed a left ABI of 1.08 and a right ABI of 0.95. 07/17/2019 on evaluation today patient appears to be doing quite well with regard to her wounds. She has been showing signs of actually dramatic improvement in just 1 week in my opinion. In fact even the area where the muscle is exposed is showing already good signs of new granulation tissue over top of the muscle which is exactly what we want to see. Overall I am very pleased at this point. 07/24/2019 upon evaluation today patient appears to be doing well with regard to her wounds. In fact the right lower extremity ulcer appears to be almost completely healed which is great news some of the dressing material stuck to the periwound region. In regard to the left lower extremity that is showing signs of improvement at slow but nonetheless is still making improvement at this point. 08/02/2019 upon evaluation today patient appears to be doing well with regard to her wounds. The right leg ulcer is actually completely healed the left leg ulcer though not healed is doing significantly better. I am very pleased with how things seem to be progressing. Especially in light of how deep this wound was initially. 08/07/19 upon evaluation today patient actually appears to be showing signs of good improvement which is excellent news. There does not appear to be any evidence of active infection which is also excellent news. Overall I'm pleased with the healing prospects currently. I do believe that this is going to continue to take some time but nonetheless I do feel like she's also making excellent improvement especially considering the depth and extent of her wound initially. 08/14/2019 on  evaluation today patient appears to be doing well with  regard to her wound. She has been tolerating the dressing changes without complication other than the fact that she states it feels like it is kind of hurting her at the top of the wrap. This may be due to the indications. Fortunately there is no signs of active infection at this time which is good news. No fevers, chills, nausea, vomiting, or diarrhea. Unfortunately she is having some issues with her breathing she states that she called her primary care office and they told her that her primary care provider, Dr. Meda Coffee, would not be back until August. That seems somewhat extreme to me but she was told if she had any issues she needed to go to the ER according to the patient. I contacted their office today actually spoke with both apparently Dr. Vickey Sages wife as well as the scheduler and got the patient appointment for tomorrow at 2. I am not sure who told her the other information. Electronic Signature(s) Signed: 08/14/2019 11:34:45 AM By: Worthy Keeler PA-C Entered By: Worthy Keeler on 08/14/2019 11:34:44 Cedillos, [name] ([id number]) -------------------------------------------------------------------------------- Physical Exam Details Patient Name: Kim, [name]. Date of Service: 08/14/2019 11:00 AM Medical Record Number: [id number] Patient Account Number: [id number] Date of Birth/Sex: 08/16/1940 (79 y.o. F) Treating RN: Montey Hora Primary Care Provider: Lemmie Evens Other Clinician: Referring Provider: Lemmie Evens Treating Provider/Extender: STONE III, Brittinee Risk Weeks in Treatment: 5 Constitutional Well-nourished and well-hydrated in no acute distress. Respiratory normal breathing without difficulty. Psychiatric this patient is able to make decisions and demonstrates good insight into disease process. Alert and Oriented x 3. pleasant and cooperative. Notes Patient's wound bed currently showed signs of excellent granulation at this time she does have some scar tissue but  again that is to be expected considering the depth of this wound. Overall I feel like she is doing quite well. Electronic Signature(s) Signed: 08/14/2019 11:35:00 AM By: Worthy Keeler PA-C Entered By: Worthy Keeler on 08/14/2019 11:34:59 Marton, [name] ([id number]) -------------------------------------------------------------------------------- Physician Orders Details Patient Name: AIRAM, RUNIONS Date of Service: 08/14/2019 11:00 AM Medical Record Number: [id number] Patient Account Number: [id number] Date of Birth/Sex: Oct 17, 1940 (79 y.o. 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 - Please do not get your wraps wet Primary Wound Dressing o Silver Alginate o Collagen - to base of wound Secondary Dressing Wound #1 Left,Lateral Lower Leg o ABD pad Dressing Change Frequency Wound #1 Left,Lateral Lower Leg o Change dressing every week Follow-up Appointments Wound #1 Left,Lateral Lower Leg o Return  Appointment in 1 week. Edema Control Wound #1 Left,Lateral Lower Leg o 3 Layer Compression System - Left Lower Extremity Electronic Signature(s) VICTOR, GRANADOS ([id number]) Signed:  08/14/2019 11:40:44 AM By: Worthy Keeler PA-C Signed: 08/14/2019 4:13:47 PM By: Montey Hora Entered By: Montey Hora on 08/14/2019 11:19:40 Pickerill, [name] ([id number]) -------------------------------------------------------------------------------- Problem List Details Patient Name: CORTLYN, CANNELL. Date of Service: 08/14/2019 11:00 AM Medical Record Number: [id number] Patient Account Number: [id number] Date of Birth/Sex: [birth year] (79 y.o. F) Treating RN: Montey Hora Primary Care Provider: Lemmie Evens Other Clinician: Referring Provider: Lemmie Evens Treating Provider/Extender: Melburn Hake, Caoilainn Sacks Weeks in Treatment: 5 Active Problems ICD-10 Evaluated Encounter Code Description Active Date Today Diagnosis S81.802A Unspecified open wound, left lower leg, initial encounter 07/10/2019 No Yes S81.801A Unspecified open wound, right lower leg, initial encounter 07/10/2019 No Yes L97.825 Non-pressure chronic ulcer of other part of left lower leg with 07/10/2019 No Yes muscle involvement without evidence of necrosis L97.812 Non-pressure chronic ulcer of other part of right lower leg 07/10/2019 No Yes with fat layer exposed I48.0 Paroxysmal atrial fibrillation 07/10/2019 No Yes I10 Essential (primary) hypertension 07/10/2019 No Yes I25.10 Atherosclerotic heart disease of native coronary artery 07/10/2019 No Yes without angina pectoris I73.89 Other specified peripheral vascular diseases 07/10/2019 No Yes F17.218 Nicotine dependence, cigarettes, with other nicotine-induced 07/10/2019 No Yes disorders BEVERLIE, KURIHARA ([id number]) Inactive Problems Resolved Problems Electronic Signature(s) Signed: 08/14/2019 11:09:41 AM By: Worthy Keeler PA-C Entered By: Worthy Keeler on 08/14/2019 11:09:41 Koslow, [name]  ([id number]) -------------------------------------------------------------------------------- Progress Note Details Patient Name: [name] Date of Service: 08/14/2019 11:00 AM Medical Record Number: [id number] Patient Account Number: [id number] Date of Birth/Sex: Sep 27, 1940 (79 y.o. F) Treating RN: Montey Hora Primary Care Provider: Lemmie Evens Other Clinician: Referring Provider: Lemmie Evens Treating Provider/Extender: Melburn Hake, Dani Danis Weeks in Treatment: 5 Subjective Chief Complaint Information obtained from Patient Bilateral LE ulcers History of Present Illness (HPI) 07/10/2019 on evaluation today patient presents for initial inspection here in our clinic concerning wounds that she sustained as a result of traumatic injuries to her left lower leg as well as her right lower leg. The right side is much less severe the left side actually has muscle exposed. This left side she actually had in a car door and I think it did tear into her quite a bit more than the trauma wound which was more of an abrasion on the right lower extremity. Nonetheless she is having a lot of drainage from the left. This is the one obviously that is deeper down to the muscle area. With that being said the patient also has paroxysmal atrial fibrillation, hypertension, coronary artery disease, peripheral vascular disease, and nicotine dependence. Currently she is not having a lot of severe pain although there is some discomfort. No fevers, chills, nausea, vomiting, or diarrhea. Patient did have arterial studies performed on 05/23/2019 which revealed a left ABI of 1.08 and a right ABI of 0.95. 07/17/2019 on evaluation today patient appears to be doing quite well with regard to her wounds. She has been showing signs of actually dramatic improvement in just 1 week in my opinion. In fact even the area where the muscle is exposed is showing already good signs of new granulation tissue over top of the muscle which  is exactly what we want to see. Overall I am very pleased at this point. 07/24/2019 upon evaluation today patient appears to be doing well with regard to her wounds. In fact the right lower extremity ulcer appears to be almost completely healed which is great news some of the dressing material stuck to the periwound region. In regard to the left lower extremity  that is showing signs of improvement at slow but nonetheless is still making improvement at this point. 08/02/2019 upon evaluation today patient appears to be doing well with regard to her wounds. The right leg ulcer is actually completely healed the left leg ulcer though not healed is doing significantly better. I am very pleased with how things seem to be progressing. Especially in light of how deep this wound was initially. 08/07/19 upon evaluation today patient actually appears to be showing signs of good improvement which is excellent news. There does not appear to be any evidence of active infection which is also excellent news. Overall I'm pleased with the healing prospects currently. I do believe that this is going to continue to take some time but nonetheless I do feel like she's also making excellent improvement especially considering the depth and extent of her wound initially. 08/14/2019 on evaluation today patient appears to be doing well with regard to her wound. She has been tolerating the dressing changes without complication other than the fact that she states it feels like it is kind of hurting her at the top of the wrap. This may be due to the indications. Fortunately there is no signs of active infection at this time which is good news. No fevers, chills, nausea, vomiting, or diarrhea. Unfortunately she is having some issues with her breathing she states that she called her primary care office and they told her that her primary care provider, Dr. Meda Coffee, would not be back until August. That seems somewhat extreme to me but she  was told if she had any issues she needed to go to the ER according to the patient. I contacted their office today actually spoke with both apparently Dr. Vickey Sages wife as well as the scheduler and got the patient appointment for tomorrow at 2. I am not sure who told her the other information. Allergies ciprofloxacin (Severity: Moderate, Reaction: nausea), penicillin (Reaction: Hives), amlodipine (Severity: Moderate, Reaction: nausea), cefaclor (Reaction: nausea), codeine (Reaction: sick), Lipitor, prednisone (Reaction: palpitations), Sulfa Haile, Hannelore C. ([id number]) (Sulfonamide Antibiotics) Objective Constitutional Well-nourished and well-hydrated in no acute distress. Vitals Time Taken: 11:00 AM, Height: 67 in, Weight: 180 lbs, BMI: 28.2, Temperature: 98.4 F, Pulse: 73 bpm, Respiratory Rate: 20 breaths/min, Blood Pressure: 138/55 mmHg. Respiratory normal breathing without difficulty. Psychiatric this patient is able to make decisions and demonstrates good insight into disease process. Alert and Oriented x 3. pleasant and cooperative. General Notes: Patient's wound bed currently showed signs of excellent granulation at this time she does have some scar tissue but again that is to be expected considering the depth of this wound. Overall I feel like she is doing quite well. Integumentary (Hair, Skin) Wound #1 status is Open. Original cause of wound was Trauma. The wound is located on the Left,Lateral Lower Leg. The wound measures 1.4cm length x 1.2cm width x 0.7cm depth; 1.319cm^2 area and 0.924cm^3 volume. There is muscle and Fat Layer (Subcutaneous Tissue) Exposed exposed. There is no tunneling noted, however, there is undermining starting at 10:00 and ending at 5:00 with a maximum distance of 1.6cm. There is a medium amount of serous drainage noted. The wound margin is flat and intact. There is large (67-100%) red granulation within the wound bed. There is a small (1-33%) amount of  necrotic tissue within the wound bed including Adherent Slough. Assessment Active Problems ICD-10 Unspecified open wound, left lower leg, initial encounter Unspecified open wound, right lower leg, initial encounter Non-pressure chronic ulcer of other part of  left lower leg with muscle involvement without evidence of necrosis Non-pressure chronic ulcer of other part of right lower leg with fat layer exposed Paroxysmal atrial fibrillation Essential (primary) hypertension Atherosclerotic heart disease of native coronary artery without angina pectoris Other specified peripheral vascular diseases Nicotine dependence, cigarettes, with other nicotine-induced disorders WILLER, OSORNO C. ([id number]) Procedures Wound #1 Pre-procedure diagnosis of Wound #1 is a Trauma, Other located on the Left,Lateral Lower Leg . There was a Three Layer Compression Therapy Procedure with a pre-treatment ABI of 1.1 by Montey Hora, RN. Post procedure Diagnosis Wound #1: Same as Pre-Procedure Plan Wound Cleansing: Wound #1 Left,Lateral Lower Leg: Cleanse wound with mild soap and water May shower with protection. - Please do not get your wraps wet Primary Wound Dressing: Silver Alginate Collagen - to base of wound Secondary Dressing: Wound #1 Left,Lateral Lower Leg: ABD pad Dressing Change Frequency: Wound #1 Left,Lateral Lower Leg: Change dressing every week Follow-up Appointments: Wound #1 Left,Lateral Lower Leg: Return Appointment in 1 week. Edema Control: Wound #1 Left,Lateral Lower Leg: 3 Layer Compression System - Left Lower Extremity 1. My suggestion at this time is can be that we go ahead and initiate treatment with a continuation of the current wound care measures using collagen into the base of the wound and then silver alginate over top to fill the space. 2. I am also going to suggest that we continue with the 3 layer compression wrap that we will take off the Unna to anchor at the top as  that may be causing her some discomfort. 3. I am also going to recommend that she continue to elevate her legs as much as she can to try to help out with edema control. 4. I did discuss with the patient today as well that I do believe that she needs to be seen by her primary care provider because of the fact that she had been told he was not there she was not sure what to do. Actually contacted their office and was able to speak with someone to get her an appointment for 2:00 tomorrow. She states she can definitely be there for that. We will see how things do following and if anything changes she can always contact the office and let me know. Otherwise I am hopeful they will be able to get things under control as far as her bronchitis is concerned. We will see patient back for reevaluation in 1 week here in the clinic. If anything worsens or changes patient will contact our office for additional recommendations. Electronic Signature(s) MAZIKEEN, HEHN ([id number]) Signed: 08/14/2019 11:37:39 AM By: Worthy Keeler PA-C Previous Signature: 08/14/2019 11:35:57 AM Version By: Worthy Keeler PA-C Entered By: Worthy Keeler on 08/14/2019 11:37:39 Romey, [name] ([id number]) -------------------------------------------------------------------------------- SuperBill Details Patient Name: [name] Date of Service: 08/14/2019 Medical Record Number: [id number] Patient Account Number: [id number] Date of Birth/Sex: 04/02/1941 (79 y.o. 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 without  evidence of L97.825 necrosis L97.812 Non-pressure chronic ulcer of other part of right lower leg with fat layer exposed I48.0 Paroxysmal atrial fibrillation I10 Essential (primary) hypertension I25.10 Atherosclerotic heart disease of native coronary artery without angina  pectoris I73.89 Other specified peripheral vascular diseases F17.218 Nicotine dependence, cigarettes, with other nicotine-induced disorders Facility Procedures CPT4 Code: 09200415 Description: (Facility Use Only) [phone] - APPLY MULTLAY COMPRS LWR LT LEG Modifier: Quantity: 1 Physician Procedures CPT4: Description Modifier Quantity Code [id number] - WC PHYS LEVEL 4 - EST PT 1 ICD-10 Diagnosis Description S81.802A Unspecified open wound, left lower leg, initial encounter S81.801A Unspecified open wound, right lower leg, initial encounter  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

## 2019-08-14 NOTE — Telephone Encounter (Signed)
Spoke with Dr. Vickey Sages office and they have appointment tomorrow with patient. Will call patient to make her aware.

## 2019-08-15 NOTE — Progress Notes (Signed)
BRIAWNA, CARVER ([id number]) Visit Report for 08/14/2019 Allergy List Details Patient Name: [name], [name]. Date of Service: 08/14/2019 11:00 AM Medical Record Number: [id number] Patient Account Number: [id number] Date of Birth/Sex: 02/07/1941 (78 y.o. F) Treating RN: Montey Hora Primary Care Ovetta Bazzano: Lemmie Evens Other Clinician: Referring Alessandra Sawdey: Lemmie Evens Treating Cipriana Biller/Extender: STONE III, HOYT Weeks in Treatment: 5 Allergies Active Allergies ciprofloxacin Reaction: nausea Severity: Moderate penicillin Reaction: Hives amlodipine Reaction: nausea Severity: Moderate cefaclor Reaction: nausea codeine Reaction: sick Lipitor prednisone Reaction: palpitations Sulfa (Sulfonamide Antibiotics) Allergy Notes Electronic Signature(s) Signed: 08/14/2019 4:13:47 PM By: Montey Hora Entered By: Montey Hora on 08/14/2019 11:18:39 Bergland, [name] ([id number]) -------------------------------------------------------------------------------- Arrival Information Details Patient Name: [name] Date of Service: 08/14/2019 11:00 AM Medical Record Number: [id number] Patient Account Number: [id number] Date of Birth/Sex: 1940-08-16 (78 y.o. F) Treating RN: Montey Hora Primary Care Anakaren Campion: Lemmie Evens Other Clinician: Referring Basilia Stuckert: Lemmie Evens Treating Satoria Dunlop/Extender: Melburn Hake, HOYT Weeks in Treatment: 5 Visit Information Patient Arrived: Walker Arrival Time: 11:00 Accompanied By: self Transfer Assistance: None Patient Identification Verified: Yes Secondary Verification Process Yes Completed: Patient Has Alerts: Yes Patient Alerts: ABI 05/23/2019 (L)1.08 (R) 0.95 TBI (L) 0.92 (R) 0.87 History Since Last Visit Added or deleted any medications: No Any new allergies or adverse reactions: No Had a fall or experienced change in activities of daily living that may affect risk of falls: No Signs or symptoms of abuse/neglect since last visito  No Hospitalized since last visit: No Implantable device outside of the clinic excluding cellular tissue based products placed in the center since last visit: No Has Dressing in Place as Prescribed: Yes Has Compression in Place as Prescribed: Yes Electronic Signature(s) Signed: 08/14/2019 1:29:53 PM By: Lorine Bears RCP, RRT, CHT Entered By: Lorine Bears on 08/14/2019 11:02:45 Meraz, [name] ([id number]) -------------------------------------------------------------------------------- Compression Therapy Details Patient Name: [name] Date of Service: 08/14/2019 11:00 AM Medical Record Number: [id number] Patient Account Number: [id number] Date of Birth/Sex: [birth year] (78 y.o. F) Treating RN: Montey Hora Primary Care Monetta Lick: Lemmie Evens Other Clinician: Referring Kenasia Scheller: Lemmie Evens Treating Taneil Lazarus/Extender: Melburn Hake, HOYT Weeks in Treatment: 5 Compression Therapy Performed for Wound Assessment: Wound #1 Left,Lateral Lower Leg Performed By: Clinician Montey Hora, RN Compression Type: Three Layer Pre Treatment ABI: 1.1 Post Procedure Diagnosis Same as Pre-procedure Electronic Signature(s) Signed: 08/14/2019 4:13:47 PM By: Montey Hora Entered By: Montey Hora on 08/14/2019 11:19:07 Payson, [name] ([id number]) -------------------------------------------------------------------------------- Encounter Discharge Information Details Patient Name: [name], [name]. Date of Service: 08/14/2019 11:00 AM Medical Record Number: [id number] Patient Account Number: [id number] Date of Birth/Sex: [birth year] (78 y.o. F) Treating RN: Montey Hora Primary Care Huxton Glaus: Lemmie Evens Other Clinician: Referring Dina Warbington: Lemmie Evens Treating [name]/Extender: Melburn Hake, HOYT Weeks in Treatment: 5 Encounter Discharge Information Items Discharge Condition: Stable Ambulatory Status: Walker Discharge Destination: Home Transportation:  Private Auto Accompanied By: self Schedule Follow-up Appointment: Yes Clinical Summary of Care: Electronic Signature(s) Signed: 08/14/2019 4:13:47 PM By: Montey Hora Entered By: Montey Hora on 08/14/2019 11:21:38 Custer, [name] ([id number]) -------------------------------------------------------------------------------- Lower Extremity Assessment Details Patient Name: [name]. Date of Service: 08/14/2019 11:00 AM Medical Record Number: [id number] Patient Account Number: [id number] Date of Birth/Sex: 12-Aug-1940 (78 y.o. F) Treating RN: Army Melia Primary Care [name]: Lemmie Evens Other Clinician: Referring Ivadell Gaul: Lemmie Evens Treating Sussan Meter/Extender: STONE III, HOYT Weeks in Treatment: 5 Edema Assessment Assessed: [Left: No] [Right: No] Edema: [Left: N] [Right: o] Calf Left: Right: Point of Measurement: 30 cm From Medial Instep 36  cm cm Ankle Left: Right: Point of Measurement: 10 cm From Medial Instep 21 cm cm Vascular Assessment Pulses: Dorsalis Pedis Palpable: [Left:Yes] Electronic Signature(s) Signed: 08/15/2019 11:18:27 AM By: Army Melia Entered By: Army Melia on 08/14/2019 11:09:29 Shaver, [name] ([id number]) -------------------------------------------------------------------------------- Multi Wound Chart Details Patient Name: [name]. Date of Service: 08/14/2019 11:00 AM Medical Record Number: [id number] Patient Account Number: [id number] Date of Birth/Sex: April 27, 1941 (78 y.o. F) Treating RN: Montey Hora Primary Care Kadarious Dikes: Lemmie Evens Other Clinician: Referring [name]: Lemmie Evens Treating Kien Mirsky/Extender: STONE III, HOYT Weeks in Treatment: 5 Vital Signs Height(in): 67 Pulse(bpm): 73 Weight(lbs): 180 Blood Pressure(mmHg): 138/55 Body Mass Index(BMI): 28 Temperature(F): 98.4 Respiratory Rate 20 (breaths/min): Photos: [N/A:N/A] Wound Location: Left Lower Leg - Lateral N/A N/A Wounding Event: Trauma  N/A N/A Primary Etiology: Trauma, Other N/A N/A Comorbid History: Cataracts, Asthma, Chronic N/A N/A Obstructive Pulmonary Disease (COPD), Arrhythmia, Coronary Artery Disease, Hypertension, Hypotension, Myocardial Infarction, Peripheral Arterial Disease Date Acquired: 06/18/2019 N/A N/A Weeks of Treatment: 5 N/A N/A Wound Status: Open N/A N/A Measurements L x W x D 1.4x1.2x0.7 N/A N/A (cm) Area (cm) : 1.319 N/A N/A Volume (cm) : 0.924 N/A N/A % Reduction in Area: 89.20% N/A N/A % Reduction in Volume: 84.80% N/A N/A Starting Position 1 10 (o'clock): Ending Position 1 5 (o'clock): Maximum Distance 1 (cm): 1.6 Undermining: Yes N/A N/A Classification: Full Thickness With Exposed N/A N/A Support Structures Exudate Amount: Medium N/A N/A Exudate Type: Serous N/A N/A MALAK, DUCHESNEAU ([id number]) Exudate Color: amber N/A N/A Wound Margin: Flat and Intact N/A N/A Granulation Amount: Large (67-100%) N/A N/A Granulation Quality: Red N/A N/A Necrotic Amount: Small (1-33%) N/A N/A Exposed Structures: Fat Layer (Subcutaneous N/A N/A Tissue) Exposed: Yes Muscle: Yes Fascia: No Tendon: No Joint: No Bone: No Epithelialization: None N/A N/A Treatment Notes Electronic Signature(s) Signed: 08/14/2019 4:13:47 PM By: Montey Hora Entered By: Montey Hora on 08/14/2019 11:14:43 Shone, [name] ([id number]) -------------------------------------------------------------------------------- [place] Details Patient Name: ISMAEL, TREPTOW. Date of Service: 08/14/2019 11:00 AM Medical Record Number: [id number] Patient Account Number: [id number] Date of Birth/Sex: 1941-05-27 (78 y.o. F) Treating RN: Montey Hora Primary Care Brooks Stotz: Lemmie Evens Other Clinician: Referring Cindie Rajagopalan: Lemmie Evens Treating Mitul Hallowell/Extender: Melburn Hake, HOYT Weeks in Treatment: 5 Active Inactive Abuse / Safety / Falls / Self Care Management Nursing Diagnoses: Potential for  falls Goals: Patient will not experience any injury related to falls Date Initiated: 07/10/2019 Target Resolution Date: 10/13/2019 Goal Status: Active Interventions: Assess fall risk on admission and as needed Notes: Necrotic Tissue Nursing Diagnoses: Impaired tissue integrity related to necrotic/devitalized tissue Goals: Necrotic/devitalized tissue will be minimized in the wound bed Date Initiated: 07/10/2019 Target Resolution Date: 10/13/2019 Goal Status: Active Interventions: Provide education on necrotic tissue and debridement process Notes: Orientation to the Wound Care Program Nursing Diagnoses: Knowledge deficit related to the wound healing center program Goals: Patient/caregiver will verbalize understanding of the [place] Program Date Initiated: 07/10/2019 Target Resolution Date: 10/13/2019 Goal Status: Active Interventions: Provide education on orientation to the wound center LENOR, PROVENCHER C. ([id number]) Notes: Wound/Skin Impairment Nursing Diagnoses: Impaired tissue integrity Goals: Ulcer/skin breakdown will heal within 14 weeks Date Initiated: 07/10/2019 Target Resolution Date: 10/13/2019 Goal Status: Active Interventions: Assess patient/caregiver ability to obtain necessary supplies Assess patient/caregiver ability to perform ulcer/skin care regimen upon admission and as needed Assess ulceration(s) every visit Notes: Electronic Signature(s) Signed: 08/14/2019 4:13:47 PM By: Montey Hora Entered By: Montey Hora on 08/14/2019 11:14:35 Slider, [name] ([id number]) --------------------------------------------------------------------------------  Pain Assessment Details Patient Name: CALLIE, FACEY. Date of Service: 08/14/2019 11:00 AM Medical Record Number: [id number] Patient Account Number: [id number] Date of Birth/Sex: [birth year] (78 y.o. F) Treating RN: Montey Hora Primary Care Launi Asencio: Lemmie Evens Other Clinician: Referring Luisdavid Hamblin:  Lemmie Evens Treating Jaydeen Odor/Extender: Melburn Hake, HOYT Weeks in Treatment: 5 Active Problems Location of Pain Severity and Description of Pain Patient Has Paino No Site Locations Pain Management and Medication Current Pain Management: Electronic Signature(s) Signed: 08/14/2019 1:29:53 PM By: Lorine Bears RCP, RRT, CHT Signed: 08/14/2019 4:13:47 PM By: Montey Hora Entered By: Lorine Bears on 08/14/2019 11:03:34 Servello, [name] ([id number]) -------------------------------------------------------------------------------- Patient/Caregiver Education Details Patient Name: MARILI, [name]. Date of Service: 08/14/2019 11:00 AM Medical Record Number: [id number] Patient Account Number: [id number] Date of Birth/Gender: [birth year] (78 y.o. F) Treating RN: Montey Hora Primary Care Physician: Lemmie Evens Other Clinician: Referring Physician: Lemmie Evens Treating Physician/Extender: Sharalyn Ink in Treatment: 5 Education Assessment Education Provided To: Patient Education Topics Provided Venous: Handouts: Other: need for ongoing compression Methods: Explain/Verbal Responses: State content correctly Electronic Signature(s) Signed: 08/14/2019 4:13:47 PM By: Montey Hora Entered By: Montey Hora on 08/14/2019 11:20:54 Difatta, [name] ([id number]) -------------------------------------------------------------------------------- Wound Assessment Details Patient Name: [name]. Date of Service: 08/14/2019 11:00 AM Medical Record Number: [id number] Patient Account Number: [id number] Date of Birth/Sex: 04-29-1941 (78 y.o. F) Treating RN: Army Melia Primary Care Tamina Cyphers: Lemmie Evens Other Clinician: Referring Dayami Taitt: Lemmie Evens Treating Secret Kristensen/Extender: STONE III, HOYT Weeks in Treatment: 5 Wound Status Wound Number: 1 Primary Trauma, Other Etiology: Wound Location: Left Lower Leg - Lateral Wound Open Wounding  Event: Trauma Status: Date Acquired: 06/18/2019 Comorbid Cataracts, Asthma, Chronic Obstructive Weeks Of Treatment: 5 History: Pulmonary Disease (COPD), Arrhythmia, Clustered Wound: No Coronary Artery Disease, Hypertension, Hypotension, Myocardial Infarction, Peripheral Arterial Disease Photos Wound Measurements Length: (cm) 1.4 % Reduction Width: (cm) 1.2 % Reduction Depth: (cm) 0.7 Epitheliali Area: (cm) 1.319 Tunneling: Volume: (cm) 0.924 Underminin Starting Ending P Maximum in Area: 89.2% in Volume: 84.8% zation: None No g: Yes Position (o'clock): 10 osition (o'clock): 5 Distance: (cm) 1.6 Wound Description Full Thickness With Exposed Support Foul Odor A Classification: Structures Slough/Fibr Wound Margin: Flat and Intact Exudate Medium Amount: Exudate Type: Serous Exudate Color: amber fter Cleansing: No ino Yes Wound Bed Granulation Amount: Large (67-100%) Exposed Structure Granulation Quality: Red Fascia Exposed: No Bartle, Sarely C. ([id number]) Necrotic Amount: Small (1-33%) Fat Layer (Subcutaneous Tissue) Exposed: Yes Necrotic Quality: Adherent Slough Tendon Exposed: No Muscle Exposed: Yes Necrosis of Muscle: No Joint Exposed: No Bone Exposed: No Treatment Notes Wound #1 (Left, Lateral Lower Leg) Notes promogran with silvercel on top, ABD, 3 layer Electronic Signature(s) Signed: 08/15/2019 11:18:27 AM By: Army Melia Entered By: Army Melia on 08/14/2019 11:07:19 Lukasiewicz, [name] ([id number]) -------------------------------------------------------------------------------- Vitals Details Patient Name: [name] Date of Service: 08/14/2019 11:00 AM Medical Record Number: [id number] Patient Account Number: [id number] Date of Birth/Sex: [birth year] (78 y.o. F) Treating RN: Montey Hora Primary Care Kellan Boehlke: Lemmie Evens Other Clinician: Referring Pacey Willadsen: Lemmie Evens Treating Ameah Chanda/Extender: STONE III, HOYT Weeks in  Treatment: 5 Vital Signs Time Taken: 11:00 Temperature (F): 98.4 Height (in): 67 Pulse (bpm): 73 Weight (lbs): 180 Respiratory Rate (breaths/min): 20 Body Mass Index (BMI): 28.2 Blood Pressure (mmHg): 138/55 Reference Range: 80 - 120 mg / dl Electronic Signature(s) Signed: 08/14/2019 1:29:53 PM By: Lorine Bears RCP, RRT, CHT Entered By: Lorine Bears on 08/14/2019 11:03:58

## 2019-08-17 NOTE — Progress Notes (Deleted)
Cardiology Office Note    Date:  08/17/2019   ID:  [name], DOB [birth year], MRN [id number]  PCP:  Lemmie Evens, MD  Cardiologist:  Ida Rogue, MD  Electrophysiologist:  None   Chief Complaint: ***  History of Present Illness:   [name] is a 79 y.o. 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  Her cardiac history dates back to 2004 when she underwent LAD stenting.  Cardiac cath in 2006 showed continued patency of her previously placed LAD stent with otherwise nonobstructive disease.  Stress testing in 2014 was normal.  With claudication-like symptoms she underwent lower extremity noninvasive imaging in 10/2017 which showed an ABI of 0.78 on the right with duplex suggesting borderline right CFA and SFA disease.Smoking cessation and a walking program was advised.  She was admitted to the hospital several times in 6-01/2018 with weakness and mechanical falls as well as a worsening anemia requiring blood transfusion and holding of her anticoagulation.  Echo in 12/2017 showed an EF of 65 to 70%, mildly dilated LV cavity, mild LVH, grade 2 diastolic dysfunction, mildly dilated left atrium, mildly dilated RV with normal RVSF.  She was admitted to the hospital in 06/2018 with recurrent mechanical fall with subsequent pelvic fracture.  She was most recently seen in the office in 06/2019.  At that time, her weight was up approximately 23 pounds with a reported baseline weight around 170 pounds and patient weighing 193 pounds.  Her EKG was concerning for A. fib with RVR with ventricular rates in the 130s bpm.  She was short of breath and had significant lower extremity edema.  She indicated she was compliant with Lasix though was uncertain  of her fluid intake.  She reported intolerance to bisoprolol secondary to diarrhea.  With regards to her A. fib with RVR, she remained off anticoagulation, as this was previously held in 2019 as outlined above.  This was not resumed at that time.  She was started on Toprol XL 25 mg daily.  It was felt her arrhythmia was exacerbating her heart failure symptoms.  ReDs vest of 30%.  She was started on a scale diuretic regimen which is outlined in Dr. Donivan Scull note from 06/26/2019.  ***   Labs independently reviewed: 08/2019 - HGB 13.0 07/2018 - TSH 0.07 06/2018 - PLT 256, potassium 3.6, BUN 18, SCr 1.61 01/2018 - albumin 3.3, AST/ALT normal  Past Medical History:  Diagnosis Date  . Anemia   . CAD (coronary artery disease)    a. 10/2002 PCI: LAD 29m (2.75x23 Cypher DES); b. 09/2004 Cath: LM nl, LAD 40p, patent stent, D1 30p, LCX 20p, diff dzs throughout, OM1 50p, OM2 60p, OM3 40p, RCA 40p-->Med Rx; c. 10/2012 MV: No ischemia/infarct.  . Carotid artery disease ([place])   . Cervical disc disease   . CKD (chronic kidney disease), stage III   . Closed fracture of multiple pubic rami ([place]) 06/24/2018  . COPD (chronic obstructive pulmonary disease) ([place])   . Diastolic dysfunction    a. 03/2017 Echo: Ef 60-65%, Gr2 DD, mild MR, mildly dil LA/RA, mildly to mod dil RV w/ nl fxn, PASP 48mmHg.  Marland Kitchen Dysphagia   . GERD (gastroesophageal reflux disease)   . History of kidney stones   .  Hypertension    2002  . Hypertriglyceridemia   . Hypothyroidism   . Myocardial infarction ([place]) 2003  . PAD (peripheral artery disease) ([place])    a. 10/2017 ABI's R = 0.78, L = 0.88. Duplex showed borderline R CFA and SFA dzs->med rx.  Marland Kitchen PAF (paroxysmal atrial fibrillation) ([place])    a. Amio/Pradaxa (CHA2DS2VASc = 6).  Marland Kitchen PONV (postoperative nausea and vomiting)   . Right renal artery stenosis ([place])    a. 09/2017 Renal Artery Duplex: no significant LRA stenosis. RRA <60%.    Past Surgical History:  Procedure Laterality Date  .  ABDOMINAL HYSTERECTOMY    . AMPUTATION Right 04/12/2014   Procedure: Revision AMPUTATION Right Long DIGIT;  Surgeon: Leanora Cover, MD;  Location: [facility];  Service: Orthopedics;  Laterality: Right;  . APPLICATION OF WOUND VAC Left 01/06/2018   Procedure: APPLICATION OF WOUND VAC;  Surgeon: Samara Deist, DPM;  Location: [facility];  Service: Podiatry;  Laterality: Left;  . BALLOON DILATION  05/05/2011   Procedure: BALLOON DILATION;  Surgeon: Rogene Houston, MD;  [facility];  Service: Endoscopy;  Laterality: N/A;  . CARDIAC CATHETERIZATION     with stent 2004  CYPHER 2.75 x 78mm coronary stent in the mid left anterior descending stenotic lesion    Last stress test showed EF of 775  . CARDIOVERSION    . CATARACT EXTRACTION Left 2002  . CATARACT EXTRACTION W/PHACO Right 05/14/2013   Procedure: CATARACT EXTRACTION PHACO AND INTRAOCULAR LENS PLACEMENT RIGHT EYE;  Surgeon: Tonny Branch, MD;  Location[facility];  Service: Ophthalmology;  Laterality: Right;  CDE:  20.45  . CERVICAL DISC SURGERY    . CHOLECYSTECTOMY    . CORONARY ANGIOPLASTY     1 stent  . ESOPHAGEAL DILATION N/A 04/25/2015   Procedure: ESOPHAGEAL DILATION;  Surgeon: Rogene Houston, MD;  [facility];  Service: Endoscopy;  Laterality: N/A;  . ESOPHAGOGASTRODUODENOSCOPY N/A 04/25/2015   Procedure: ESOPHAGOGASTRODUODENOSCOPY (EGD);  Surgeon: Rogene Houston, MD;  [facility];  Service: Endoscopy;  Laterality: N/A;  1225  . IR SACROPLASTY BILATERAL  06/13/2018  . IRRIGATION AND DEBRIDEMENT FOOT Left 01/06/2018   Procedure: IRRIGATION AND DEBRIDEMENT FOOT;  Surgeon: Samara Deist, DPM;  Location: [facility];  Service: Podiatry;  Laterality: Left;  Marland Kitchen MALONEY DILATION  05/05/2011   Procedure: MALONEY DILATION;  Surgeon: Rogene Houston, MD;  [facility];  Service: Endoscopy;  Laterality: N/A;  . right knee arthroscopy  2010  . SAVORY DILATION  05/05/2011   Procedure: SAVORY DILATION;  Surgeon: Rogene Houston, MD;  [facility];  Service: Endoscopy;  Laterality: N/A;  . SHOULDER SURGERY Right    torn rotator cuff  . TOENAIL EXCISION    . TOTAL ABDOMINAL HYSTERECTOMY W/ BILATERAL SALPINGOOPHORECTOMY      Current Medications: No outpatient medications have been marked as taking for the 08/27/19 encounter (Appointment) with Rise Mu, PA-C.    Allergies:   Ciprofloxacin, Penicillins, Amlodipine, Morphine and related, Cefaclor, Codeine, Lipitor [atorvastatin], Prednisone, and Sulfa antibiotics   Social History   Socioeconomic History  . Marital status: Widowed    Spouse name: Not on file  . Number of children: 1  . Years of education: Not on file  . Highest education level: Not on file  Occupational History    Employer: RETIRED  Tobacco Use  . Smoking status: Current Every Day Smoker    Packs/day: 1.00    Years:  56.00    Pack years: 56.00    Types: Cigarettes  . Smokeless tobacco: Never Used  Substance and Sexual Activity  . Alcohol use: Yes    Alcohol/week: 1.0 standard drinks    Types: 1 Cans of beer per week  . Drug use: No  . Sexual activity: Not Currently  Other Topics Concern  . Not on file  Social History Narrative   [place]  She does not routinely exercise.   Has a walker to ambulate   Social Determinants of Health   Financial Resource Strain:   . Difficulty of Paying Living Expenses: Not on file  Food Insecurity:   . Worried About Charity fundraiser in the Last Year: Not on file  . Ran Out of Food in the Last Year: Not on file  Transportation Needs:   . Lack of Transportation (Medical): Not on file  . Lack of Transportation (Non-Medical): Not on file  Physical Activity:   . Days of Exercise per Week: Not on file  . Minutes of Exercise per Session: Not on file  Stress:   . Feeling of Stress : Not on file  Social Connections:   . Frequency of Communication with Friends and Family: Not on file    . Frequency of Social Gatherings with Friends and Family: Not on file  . Attends Religious Services: Not on file  . Active Member of Clubs or Organizations: Not on file  . Attends Archivist Meetings: Not on file  . Marital Status: Not on file     Family History:  The patient's family history includes Diabetes in her mother; Heart attack in her father; Heart disease in her brother and sister; Heart failure in her mother; Hypertension in her sister; Pancreatic cancer in her sister; Stroke in her mother. There is no history of Colon cancer.  ROS:   ROS   EKGs/Labs/Other Studies Reviewed:    Studies reviewed were summarized above. The additional studies were reviewed today:  2D echo 12/2017: - Procedure narrative: Transthoracic echocardiography. Image  quality was adequate. The study was technically difficult, as a  result of body habitus.  - Left ventricle: The cavity size was mildly dilated. Wall  thickness was increased in a pattern of mild LVH. Systolic  function was vigorous. The estimated ejection fraction was in the  range of 65% to 70%. Features are consistent with a pseudonormal  left ventricular filling pattern, with concomitant abnormal  relaxation and increased filling pressure (grade 2 diastolic  dysfunction). Doppler parameters are consistent with high  ventricular filling pressure.  - Left atrium: The atrium was mildly dilated.  - Right ventricle: The cavity size was mildly dilated. Systolic  function was normal. .   EKG:  EKG is ordered today.  The EKG ordered today demonstrates ***  Recent Labs: 01/30/2019: Creatinine, Ser 0.80 08/07/2019: Hemoglobin 13.0  Recent Lipid Panel No results found for: CHOL, TRIG, HDL, CHOLHDL, VLDL, LDLCALC, LDLDIRECT  PHYSICAL EXAM:    VS:  There were no vitals taken for this visit.  BMI: There is no height or weight on file to calculate BMI.  Physical Exam  Wt Readings from Last 3 Encounters:   06/26/19 193 lb (87.5 kg)  02/13/19 181 lb 12 oz (82.4 kg)  11/16/18 170 lb (77.1 kg)     ASSESSMENT & PLAN:   1. ***  Disposition: F/u with Dr. Rockey Situ or an APP in ***.   Medication  Adjustments/Labs and Tests Ordered: Current medicines are reviewed at length with the patient today.  Concerns regarding medicines are outlined above. Medication changes, Labs and Tests ordered today are summarized above and listed in the Patient Instructions accessible in Encounters.   Signed, Christell Faith, PA-C 08/17/2019 12:24 PM     [address], [place] [id number] [phone]

## 2019-08-21 ENCOUNTER — Encounter: Payer: Medicare HMO | Admitting: Physician Assistant

## 2019-08-21 ENCOUNTER — Other Ambulatory Visit: Payer: Self-pay

## 2019-08-21 DIAGNOSIS — L97812 Non-pressure chronic ulcer of other part of right lower leg with fat layer exposed: Secondary | ICD-10-CM | POA: Diagnosis not present

## 2019-08-21 NOTE — Progress Notes (Addendum)
FABIOLA, MUDGETT ([id number]) Visit Report for 08/21/2019 Chief Complaint Document Details Patient Name: [name], [name]. Date of Service: 08/21/2019 11:00 AM Medical Record Number: [id number] Patient Account Number: [id number] Date of Birth/Sex: 02-Dec-1940 (79 y.o. F) Treating RN: Montey Hora Primary Care Provider: Lemmie Evens Other Clinician: Referring Provider: Lemmie Evens Treating Provider/Extender: Melburn Hake, Melesio Madara Weeks in Treatment: 6 Information Obtained from: Patient Chief Complaint Bilateral LE ulcers Electronic Signature(s) Signed: 08/21/2019 11:01:55 AM By: Worthy Keeler PA-C Entered By: Worthy Keeler on 08/21/2019 11:01:55 Anzalone, [name] ([id number]) -------------------------------------------------------------------------------- HPI Details Patient Name: [name], [name] Date of Service: 08/21/2019 11:00 AM Medical Record Number: [id number] Patient Account Number: [id number] Date of Birth/Sex: 02-16-1941 (79 y.o. F) Treating RN: Montey Hora Primary Care Provider: Lemmie Evens Other Clinician: Referring Provider: Lemmie Evens Treating Provider/Extender: Melburn Hake, Llesenia Fogal Weeks in Treatment: 6 History of Present Illness HPI Description: 07/10/2019 on evaluation today patient presents for initial inspection here in our clinic concerning wounds that she sustained as a result of traumatic injuries to her left lower leg as well as her right lower leg. The right side is much less severe the left side actually has muscle exposed. This left side she actually had in a car door and I think it did tear into her quite a bit more than the trauma wound which was more of an abrasion on the right lower extremity. Nonetheless she is having a lot of drainage from the left. This is the one obviously that is deeper down to the muscle area. With that being said the patient also has paroxysmal atrial fibrillation, hypertension, coronary artery disease, peripheral vascular  disease, and nicotine dependence. Currently she is not having a lot of severe pain although there is some discomfort. No fevers, chills, nausea, vomiting, or diarrhea. Patient did have arterial studies performed on 05/23/2019 which revealed a left ABI of 1.08 and a right ABI of 0.95. 07/17/2019 on evaluation today patient appears to be doing quite well with regard to her wounds. She has been showing signs of actually dramatic improvement in just 1 week in my opinion. In fact even the area where the muscle is exposed is showing already good signs of new granulation tissue over top of the muscle which is exactly what we want to see. Overall I am very pleased at this point. 07/24/2019 upon evaluation today patient appears to be doing well with regard to her wounds. In fact the right lower extremity ulcer appears to be almost completely healed which is great news some of the dressing material stuck to the periwound region. In regard to the left lower extremity that is showing signs of improvement at slow but nonetheless is still making improvement at this point. 08/02/2019 upon evaluation today patient appears to be doing well with regard to her wounds. The right leg ulcer is actually completely healed the left leg ulcer though not healed is doing significantly better. I am very pleased with how things seem to be progressing. Especially in light of how deep this wound was initially. 08/07/19 upon evaluation today patient actually appears to be showing signs of good improvement which is excellent news. There does not appear to be any evidence of active infection which is also excellent news. Overall I'm pleased with the healing prospects currently. I do believe that this is going to continue to take some time but nonetheless I do feel like she's also making excellent improvement especially considering the depth and extent of her wound initially. 08/14/2019 on  evaluation today patient appears to be doing well  with regard to her wound. She has been tolerating the dressing changes without complication other than the fact that she states it feels like it is kind of hurting her at the top of the wrap. This may be due to the indications. Fortunately there is no signs of active infection at this time which is good news. No fevers, chills, nausea, vomiting, or diarrhea. Unfortunately she is having some issues with her breathing she states that she called her primary care office and they told her that her primary care provider, Dr. Meda Coffee, would not be back until August. That seems somewhat extreme to me but she was told if she had any issues she needed to go to the ER according to the patient. I contacted their office today actually spoke with both apparently Dr. Vickey Sages wife as well as the scheduler and got the patient appointment for tomorrow at 2. I am not sure who told her the other information. 08/21/2019 upon evaluation today patient appears to be doing very well with regard to the wound on her lower extremity. She has been tolerating the dressing changes without complication will be using a compression wrap but overall she seems to be tolerating this quite well. I am very pleased with how things are progressing. The patient likewise is happy to see improvement. I do think she could potentially benefit from a snap VAC that something that we can check into although again I am not sure that her insurance will cover this or if they do how much her portion may be. We will definitely let her know once we get the results back from authorization. Electronic Signature(s) NALEIGHA, RAIMONDI ([id number]) Signed: 08/21/2019 1:31:01 PM By: Worthy Keeler PA-C Entered By: Worthy Keeler on 08/21/2019 13:31:01 [name], [name] [name] ([id number]) -------------------------------------------------------------------------------- Physical Exam Details Patient Name: [name], [name]. Date of Service: 08/21/2019 11:00 AM Medical  Record Number: [id number] Patient Account Number: [id number] Date of Birth/Sex: 1940/07/19 (79 y.o. F) Treating RN: Montey Hora Primary Care Provider: Lemmie Evens Other Clinician: Referring Provider: Lemmie Evens Treating Provider/Extender: STONE III, Valeta Paz Weeks in Treatment: 6 Constitutional Well-nourished and well-hydrated in no acute distress. Respiratory normal breathing without difficulty. Psychiatric this patient is able to make decisions and demonstrates good insight into disease process. Alert and Oriented x 3. pleasant and cooperative. Notes Patient's wound bed currently showed signs of good granulation there was some minimal slough noted on the surface of the wound I did perform mechanical debridement to remove necrotic tissue from the surface which the patient tolerated without complication post debridement she seems to be doing much better. She does still have some undermining from 12 to roughly 3:00. Electronic Signature(s) Signed: 08/21/2019 1:31:23 PM By: Worthy Keeler PA-C Entered By: Worthy Keeler on 08/21/2019 13:31:23 [name], [name] ([id number]) -------------------------------------------------------------------------------- Physician Orders Details Patient Name: [name], [name] Date of Service: 08/21/2019 11:00 AM Medical Record Number: [id number] Patient Account Number: [id number] Date of Birth/Sex: 01/31/1941 (79 y.o. 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 - Please do not get your wraps wet Primary Wound Dressing o Silver Alginate o Collagen - to base of wound Secondary Dressing Wound #1 Left,Lateral Lower Leg o ABD pad Dressing Change Frequency Wound #1 Left,Lateral Lower Leg o Change dressing every week Follow-up Appointments Wound #1 Left,Lateral Lower Leg o Return Appointment in 1 week. Edema Control Wound #1 Left,Lateral Lower Leg o 3 Layer Compression System - Left Lower Extremity Electronic Signature(s) [name], [name] ([id number]) Signed: 08/21/2019 4:37:42 PM By: Montey Hora Signed: 08/21/2019 5:42:48 PM By: Worthy Keeler PA-C Entered By: Montey Hora on 08/21/2019 11:28:25 [name], [name] ([id number]) -------------------------------------------------------------------------------- Problem List Details Patient Name: [name], CASA. Date of Service: 08/21/2019 11:00 AM Medical Record Number: [id number] Patient Account Number: [id number] Date of Birth/Sex: 04/22/1941 (79 y.o. F) Treating RN: Montey Hora Primary Care Provider: Lemmie Evens Other Clinician: Referring Provider: Lemmie Evens Treating Provider/Extender: Melburn Hake, Lamiya Naas Weeks in Treatment: 6 Active Problems ICD-10 Evaluated Encounter Code Description Active Date Today Diagnosis S81.802A Unspecified open wound, left lower leg, initial encounter 07/10/2019 No Yes S81.801A Unspecified open wound, right lower leg, initial encounter 07/10/2019 No  Yes L97.825 Non-pressure chronic ulcer of other part of left lower leg with 07/10/2019 No Yes muscle involvement without evidence of necrosis L97.812 Non-pressure chronic ulcer of other part of right lower leg 07/10/2019 No Yes with fat layer exposed I48.0 Paroxysmal atrial fibrillation 07/10/2019 No Yes I10 Essential (primary) hypertension 07/10/2019 No Yes I25.10 Atherosclerotic heart disease of native coronary artery 07/10/2019 No Yes without angina pectoris I73.89 Other specified peripheral vascular diseases 07/10/2019 No Yes F17.218 Nicotine dependence, cigarettes, with other nicotine-induced 07/10/2019 No Yes disorders [name], [name] ([id number]) Inactive Problems Resolved Problems Electronic Signature(s) Signed: 08/21/2019 11:01:50 AM By: Worthy Keeler PA-C Entered By: Worthy Keeler on 08/21/2019 11:01:49 [name], [name] ([id number]) -------------------------------------------------------------------------------- Progress Note Details Patient Name: [name] Date of Service: 08/21/2019 11:00 AM Medical Record Number: [id number] Patient Account Number: [id number] Date of Birth/Sex: Nov 30, 1940 (79 y.o. F) Treating RN: Montey Hora Primary Care Provider: Lemmie Evens Other Clinician: Referring Provider: Lemmie Evens Treating Provider/Extender: Melburn Hake, Brightyn Mozer Weeks in Treatment: 6 Subjective Chief Complaint Information obtained from Patient Bilateral LE ulcers History of Present Illness (HPI) 07/10/2019 on evaluation today patient presents for initial inspection here in our clinic concerning wounds that she sustained as a result of traumatic injuries to her left lower leg as well as her right lower leg. The right side is much less severe the left side actually has muscle exposed. This left side she actually had in a car door and I think it did tear into her quite a bit more than the trauma wound which was more of an abrasion on the right lower extremity. Nonetheless she is  having a lot of drainage from the left. This is the one obviously that is deeper down to the muscle area. With that being said the patient also has paroxysmal atrial fibrillation, hypertension, coronary artery disease, peripheral vascular disease, and nicotine dependence. Currently she is not having a lot of severe pain although there is some discomfort. No fevers, chills, nausea, vomiting, or diarrhea. Patient did have  arterial studies performed on 05/23/2019 which revealed a left ABI of 1.08 and a right ABI of 0.95. 07/17/2019 on evaluation today patient appears to be doing quite well with regard to her wounds. She has been showing signs of actually dramatic improvement in just 1 week in my opinion. In fact even the area where the muscle is exposed is showing already good signs of new granulation tissue over top of the muscle which is exactly what we want to see. Overall I am very pleased at this point. 07/24/2019 upon evaluation today patient appears to be doing well with regard to her wounds. In fact the right lower extremity ulcer appears to be almost completely healed which is great news some of the dressing material stuck to the periwound region. In regard to the left lower extremity that is showing signs of improvement at slow but nonetheless is still making improvement at this point. 08/02/2019 upon evaluation today patient appears to be doing well with regard to her wounds. The right leg ulcer is actually completely healed the left leg ulcer though not healed is doing significantly better. I am very pleased with how things seem to be progressing. Especially in light of how deep this wound was initially. 08/07/19 upon evaluation today patient actually appears to be showing signs of good improvement which is excellent news. There does not appear to be any evidence of active infection which is also excellent news. Overall I'm pleased with the healing prospects currently. I do believe that this is  going to continue to take some time but nonetheless I do feel like she's also making excellent improvement especially considering the depth and extent of her wound initially. 08/14/2019 on evaluation today patient appears to be doing well with regard to her wound. She has been tolerating the dressing changes without complication other than the fact that she states it feels like it is kind of hurting her at the top of the wrap. This may be due to the indications. Fortunately there is no signs of active infection at this time which is good news. No fevers, chills, nausea, vomiting, or diarrhea. Unfortunately she is having some issues with her breathing she states that she called her primary care office and they told her that her primary care provider, Dr. Meda Coffee, would not be back until August. That seems somewhat extreme to me but she was told if she had any issues she needed to go to the ER according to the patient. I contacted their office today actually spoke with both apparently Dr. Vickey Sages wife as well as the scheduler and got the patient appointment for tomorrow at 2. I am not sure who told her the other information. 08/21/2019 upon evaluation today patient appears to be doing very well with regard to the wound on her lower extremity. She has been tolerating the dressing changes without complication will be using a compression wrap but overall she seems to be tolerating this quite well. I am very pleased with how things are progressing. The patient likewise is happy to see improvement. I do think she could potentially benefit from a snap VAC that something that we can check into although again I [name], Kim C. ([id number]) am not sure that her insurance will cover this or if they do how much her portion may be. We will definitely let her know once we get the results back from authorization. Objective Constitutional Well-nourished and well-hydrated in no acute distress. Vitals Time Taken:  10:56 AM, Height: 67 in, Weight:  180 lbs, BMI: 28.2, Temperature: 97.6 F, Pulse: 96 bpm, Respiratory Rate: 16 breaths/min, Blood Pressure: 181/64 mmHg. Respiratory normal breathing without difficulty. Psychiatric this patient is able to make decisions and demonstrates good insight into disease process. Alert and Oriented x 3. pleasant and cooperative. General Notes: Patient's wound bed currently showed signs of good granulation there was some minimal slough noted on the surface of the wound I did perform mechanical debridement to remove necrotic tissue from the surface which the patient tolerated without complication post debridement she seems to be doing much better. She does still have some undermining from 12 to roughly 3:00. Integumentary (Hair, Skin) Wound #1 status is Open. Original cause of wound was Trauma. The wound is located on the Left,Lateral Lower Leg. The wound measures 1.4cm length x 1cm width x 0.8cm depth; 1.1cm^2 area and 0.88cm^3 volume. There is muscle and Fat Layer (Subcutaneous Tissue) Exposed exposed. There is no tunneling noted, however, there is undermining starting at 12:00 and ending at 4:00 with a maximum distance of 1cm. There is a medium amount of serous drainage noted. The wound margin is flat and intact. There is large (67-100%) red granulation within the wound bed. There is a small (1-33%) amount of necrotic tissue within the wound bed including Adherent Slough. 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 ([id number]) Procedures Wound #1 Pre-procedure diagnosis of Wound #1 is a Trauma, Other located on the Left,Lateral Lower Leg . There was a Three Layer Compression Therapy Procedure with a pre-treatment ABI of 1.1 by Montey Hora, RN. Post procedure Diagnosis Wound #1: Same as Pre-Procedure Plan Wound Cleansing: Wound #1 Left,Lateral Lower Leg: Cleanse wound with mild soap and water May shower with protection. - Please do not get your wraps wet Primary Wound Dressing: Silver Alginate Collagen - to base of wound Secondary Dressing: Wound #1 Left,Lateral Lower Leg: ABD pad Dressing Change Frequency: Wound #1 Left,Lateral Lower Leg: Change dressing every week Follow-up Appointments: Wound #1 Left,Lateral Lower Leg: Return Appointment in 1 week. Edema Control: Wound #1 Left,Lateral Lower Leg: 3 Layer Compression System - Left Lower Extremity 1. My suggestion currently is can be that we continue with the collagen underneath the region of undermining in the base of the wound and then subsequently the alginate and behind this to fill the space. 2. I am get a check into a snap VAC for the patient I think this could be beneficial and if we are able to get it approved it may help speed up her healing process. 3. I do recommend we continue as well with the 3 layer compression wrap but also seems to be helpful for her. We will see patient back for reevaluation in 1 week here in the clinic. If anything worsens or changes patient will contact our office for additional recommendations. Electronic Signature(s) Signed: 08/21/2019 1:32:04 PM By: Worthy Keeler PA-C Entered By: Worthy Keeler on 08/21/2019 13:32:04 FELEICA, FULMORE ([id number] Craigo, [name] ([id number]) -------------------------------------------------------------------------------- SuperBill  Details Patient Name: SAHVANNAH, RIESER Date of Service: 08/21/2019 Medical Record Number: [id number]  Patient Account Number: [id number] Date of Birth/Sex: 07-08-1940 (79 y.o. 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 L97.812 Non-pressure chronic ulcer of other part of right lower leg with fat layer exposed I48.0 Paroxysmal atrial fibrillation I10 Essential (primary) hypertension I25.10 Atherosclerotic heart disease of native coronary artery without angina pectoris I73.89 Other specified peripheral vascular diseases F17.218 Nicotine dependence, cigarettes, with other nicotine-induced disorders Facility Procedures CPT4 Code: [id number] Description: (Facility Use Only) [phone] - [facility] LT LEG Modifier: Quantity: 1 Physician Procedures CPT4: Description Modifier Quantity Code [id number] - WC PHYS LEVEL 3 - EST PT 1 ICD-10 Diagnosis Description S81.802A Unspecified open wound, left lower leg, initial encounter S81.801A Unspecified open wound, right lower leg, initial encounter  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

## 2019-08-21 NOTE — Progress Notes (Signed)
Kim, [name] ([id number]) Visit Report for 08/21/2019 Arrival Information Details Patient Name: [name], [name]. Date of Service: 08/21/2019 11:00 AM Medical Record Number: [id number] Patient Account Number: [id number] Date of Birth/Sex: [birth year] (79 y.o. F) Treating RN: Army Melia Primary Care Travelle Mcclimans: Lemmie Evens Other Clinician: Referring Idonna Heeren: Lemmie Evens Treating Dione Petron/Extender: Melburn Hake, HOYT Weeks in Treatment: 6 Visit Information History Since Last Visit Added or deleted any medications: No Patient Arrived: Walker Any new allergies or adverse reactions: No Arrival Time: 10:56 Had a fall or experienced change in No Accompanied By: self activities of daily living that may affect Transfer Assistance: None risk of falls: Patient Has Alerts: Yes Signs or symptoms of abuse/neglect since last visito No Patient Alerts: ABI 05/23/2019 Hospitalized since last visit: No (L)1.08 (R) 0.95 Has Dressing in Place as Prescribed: Yes TBI (L) 0.92 (R) 0.87 Pain Present Now: No Electronic Signature(s) Signed: 08/21/2019 11:38:05 AM By: Army Melia Entered By: Army Melia on 08/21/2019 10:56:45 Counterman, [name] ([id number]) -------------------------------------------------------------------------------- Compression Therapy Details Patient Name: [name] Date of Service: 08/21/2019 11:00 AM Medical Record Number: [id number] Patient Account Number: [id number] Date of Birth/Sex: 1941-06-27 (79 y.o. F) Treating RN: Montey Hora Primary Care Eldred Sooy: Lemmie Evens Other Clinician: Referring Dontae Minerva: Lemmie Evens Treating Liliya Fullenwider/Extender: Melburn Hake, HOYT Weeks in Treatment: 6 Compression Therapy Performed for Wound Assessment: Wound #1 Left,Lateral Lower Leg Performed By: Clinician Montey Hora, RN Compression Type: Three Layer Pre Treatment ABI: 1.1 Post Procedure Diagnosis Same as Pre-procedure Electronic Signature(s) Signed: 08/21/2019 4:37:42  PM By: Montey Hora Entered By: Montey Hora on 08/21/2019 11:27:42 Mukai, [name] ([id number]) -------------------------------------------------------------------------------- Encounter Discharge Information Details Patient Name: Kim, [name]. Date of Service: 08/21/2019 11:00 AM Medical Record Number: [id number] Patient Account Number: [id number] Date of Birth/Sex: Oct 23, 1940 (79 y.o. F) Treating RN: Montey Hora Primary Care Merle Cirelli: Lemmie Evens Other Clinician: Referring Geralene Afshar: Lemmie Evens Treating Landan Fedie/Extender: Melburn Hake, HOYT Weeks in Treatment: 6 Encounter Discharge Information Items Discharge Condition: Stable Ambulatory Status: Walker Discharge Destination: Home Transportation: Private Auto Accompanied By: self Schedule Follow-up Appointment: Yes Clinical Summary of Care: Electronic Signature(s) Signed: 08/21/2019 11:36:47 AM By: Montey Hora Previous Signature: 08/21/2019 11:36:28 AM Version By: Montey Hora Entered By: Montey Hora on 08/21/2019 11:36:46 Sowles, [name] ([id number]) -------------------------------------------------------------------------------- Lower Extremity Assessment Details Patient Name: Kim, [name] Date of Service: 08/21/2019 11:00 AM Medical Record Number: [id number] Patient Account Number: [id number] Date of Birth/Sex: 1940/11/21 (79 y.o. F) Treating RN: Army Melia Primary Care Mandi Mattioli: Lemmie Evens Other Clinician: Referring Donatello Kleve: Lemmie Evens Treating Adalene Gulotta/Extender: Melburn Hake, HOYT Weeks in Treatment: 6 Edema Assessment Assessed: [Left: No] [Right: No] Edema: [Left: Ye] [Right: s] Calf Left: Right: Point of Measurement: 30 cm From Medial Instep 35 cm cm Ankle Left: Right: Point of Measurement: 10 cm From Medial Instep 20 cm cm Vascular Assessment Pulses: Dorsalis Pedis Palpable: [Left:Yes] Electronic Signature(s) Signed: 08/21/2019 11:38:05 AM By: Army Melia Entered By: Army Melia on 08/21/2019 11:05:50 Pullen, [name] ([id number]) -------------------------------------------------------------------------------- Multi Wound Chart Details Patient Name: [name]. Date of Service: 08/21/2019 11:00 AM Medical Record Number: [id number] Patient Account Number: [id number] Date of Birth/Sex: 07-01-1941 (79 y.o. F) Treating RN: Montey Hora Primary Care Livianna Petraglia: Lemmie Evens Other Clinician: Referring Gayle Collard: Lemmie Evens Treating Jaidan Stachnik/Extender: STONE III, HOYT Weeks in Treatment: 6 Vital Signs Height(in): 67 Pulse(bpm): 96 Weight(lbs): 180 Blood Pressure(mmHg): 181/64 Body Mass Index(BMI): 28 Temperature(F): 97.6 Respiratory Rate 16 (breaths/min): Photos: [N/A:N/A] Wound Location: Left Lower Leg - Lateral N/A N/A Wounding Event:  Trauma N/A N/A Primary Etiology: Trauma, Other N/A N/A Comorbid History: Cataracts, Asthma, Chronic N/A N/A Obstructive Pulmonary Disease (COPD), Arrhythmia, Coronary Artery Disease, Hypertension, Hypotension, Myocardial Infarction, Peripheral Arterial Disease Date Acquired: 06/18/2019 N/A N/A Weeks of Treatment: 6 N/A N/A Wound Status: Open N/A N/A Measurements L x W x D 1.4x1x0.8 N/A N/A (cm) Area (cm) : 1.1 N/A N/A Volume (cm) : 0.88 N/A N/A % Reduction in Area: 91.00% N/A N/A % Reduction in Volume: 85.50% N/A N/A Starting Position 1 12 (o'clock): Ending Position 1 4 (o'clock): Maximum Distance 1 (cm): 1 Undermining: Yes N/A N/A Classification: Full Thickness With Exposed N/A N/A Support Structures Exudate Amount: Medium N/A N/A Exudate Type: Serous N/A N/A DEBIE, ASHLINE ([id number]) Exudate Color: amber N/A N/A Wound Margin: Flat and Intact N/A N/A Granulation Amount: Large (67-100%) N/A N/A Granulation Quality: Red N/A N/A Necrotic Amount: Small (1-33%) N/A N/A Exposed Structures: Fat Layer (Subcutaneous N/A N/A Tissue) Exposed: Yes Muscle: Yes Fascia: No Tendon: No Joint:  No Bone: No Epithelialization: None N/A N/A Treatment Notes Electronic Signature(s) Signed: 08/21/2019 4:37:42 PM By: Montey Hora Entered By: Montey Hora on 08/21/2019 11:26:15 [name] ([id number]) -------------------------------------------------------------------------------- Multi-Disciplinary Care Plan Details Patient Name: [name]. Date of Service: 08/21/2019 11:00 AM Medical Record Number: [id number] Patient Account Number: [id number] Date of Birth/Sex: [birth year] (79 y.o. F) Treating RN: Montey Hora Primary Care Monroe Qin: Lemmie Evens Other Clinician: Referring Ezekiel Menzer: Lemmie Evens Treating Khira Cudmore/Extender: Melburn Hake, HOYT Weeks in Treatment: 6 Active Inactive Abuse / Safety / Falls / Self Care Management Nursing Diagnoses: Potential for falls Goals: Patient will not experience any injury related to falls Date Initiated: 07/10/2019 Target Resolution Date: 10/13/2019 Goal Status: Active Interventions: Assess fall risk on admission and as needed Notes: Necrotic Tissue Nursing Diagnoses: Impaired tissue integrity related to necrotic/devitalized tissue Goals: Necrotic/devitalized tissue will be minimized in the wound bed Date Initiated: 07/10/2019 Target Resolution Date: 10/13/2019 Goal Status: Active Interventions: Provide education on necrotic tissue and debridement process Notes: Orientation to the Wound Care Program Nursing Diagnoses: Knowledge deficit related to the wound healing center program Goals: Patient/caregiver will verbalize understanding of the [facility] Date Initiated: 07/10/2019 Target Resolution Date: 10/13/2019 Goal Status: Active Interventions: Provide education on orientation to the wound center MANDEE, PLUTA C. ([id number]) Notes: Wound/Skin Impairment Nursing Diagnoses: Impaired tissue integrity Goals: Ulcer/skin breakdown will heal within 14 weeks Date Initiated: 07/10/2019 Target Resolution  Date: 10/13/2019 Goal Status: Active Interventions: Assess patient/caregiver ability to obtain necessary supplies Assess patient/caregiver ability to perform ulcer/skin care regimen upon admission and as needed Assess ulceration(s) every visit Notes: Electronic Signature(s) Signed: 08/21/2019 4:37:42 PM By: Montey Hora Entered By: Montey Hora on 08/21/2019 11:26:06 Talbot, [name] ([id number]) -------------------------------------------------------------------------------- Pain Assessment Details Patient Name: [name]. Date of Service: 08/21/2019 11:00 AM Medical Record Number: [id number] Patient Account Number: [id number] Date of Birth/Sex: [birth year] (79 y.o. F) Treating RN: Army Melia Primary Care Talor Cheema: Lemmie Evens Other Clinician: Referring Fawzi Melman: Lemmie Evens Treating Lawsen Arnott/Extender: Melburn Hake, HOYT Weeks in Treatment: 6 Active Problems Location of Pain Severity and Description of Pain Patient Has Paino No Site Locations Pain Management and Medication Current Pain Management: Electronic Signature(s) Signed: 08/21/2019 11:38:05 AM By: Army Melia Entered By: Army Melia on 08/21/2019 10:56:53 Cork, [name] ([id number]) -------------------------------------------------------------------------------- Patient/Caregiver Education Details Patient Name: [name]. Date of Service: 08/21/2019 11:00 AM Medical Record Number: [id number] Patient Account Number: [id number] Date of Birth/Gender: [birth year] (79 y.o. F) Treating RN: Montey Hora Primary Care  Physician: Lemmie Evens Other Clinician: Referring Physician: Lemmie Evens Treating Physician/Extender: Sharalyn Ink in Treatment: 6 Education Assessment Education Provided To: Patient Education Topics Provided Venous: Handouts: Other: leg elevation Methods: Explain/Verbal Responses: State content correctly Electronic Signature(s) Signed: 08/21/2019 4:37:42 PM By:  Montey Hora Entered By: Montey Hora on 08/21/2019 11:35:58 Man, [name] ([id number]) -------------------------------------------------------------------------------- Wound Assessment Details Patient Name: [name]. Date of Service: 08/21/2019 11:00 AM Medical Record Number: [id number] Patient Account Number: [id number] Date of Birth/Sex: Feb 28, 1941 (79 y.o. F) Treating RN: Army Melia Primary Care Melane Windholz: Lemmie Evens Other Clinician: Referring Trellis Vanoverbeke: Lemmie Evens Treating Sieara Bremer/Extender: STONE III, HOYT Weeks in Treatment: 6 Wound Status Wound Number: 1 Primary Trauma, Other Etiology: Wound Location: Left Lower Leg - Lateral Wound Open Wounding Event: Trauma Status: Date Acquired: 06/18/2019 Comorbid Cataracts, Asthma, Chronic Obstructive Weeks Of Treatment: 6 History: Pulmonary Disease (COPD), Arrhythmia, Clustered Wound: No Coronary Artery Disease, Hypertension, Hypotension, Myocardial Infarction, Peripheral Arterial Disease Photos Wound Measurements Length: (cm) 1.4 % Reducti Width: (cm) 1 % Reducti Depth: (cm) 0.8 Epithelia Area: (cm) 1.1 Tunnelin Volume: (cm) 0.88 Undermin Starti Ending Maximu on in Area: 91% on in Volume: 85.5% lization: None g: No ing: Yes ng Position (o'clock): 12 Position (o'clock): 4 m Distance: (cm) 1 Wound Description Full Thickness With Exposed Support Foul Odor Classification: Structures Slough/Fi Wound Margin: Flat and Intact Exudate Medium Amount: Exudate Type: Serous Exudate Color: amber After Cleansing: No brino Yes Wound Bed Granulation Amount: Large (67-100%) Exposed Structure Granulation Quality: Red Fascia Exposed: No Melaragno, Glyn C. ([id number]) Necrotic Amount: Small (1-33%) Fat Layer (Subcutaneous Tissue) Exposed: Yes Necrotic Quality: Adherent Slough Tendon Exposed: No Muscle Exposed: Yes Necrosis of Muscle: No Joint Exposed: No Bone Exposed: No Treatment Notes Wound #1  (Left, Lateral Lower Leg) Notes promogran with silvercel on top, ABD, 3 layer Electronic Signature(s) Signed: 08/21/2019 11:38:05 AM By: Army Melia Entered By: Army Melia on 08/21/2019 11:04:29 Enfield, [name] ([id number]) -------------------------------------------------------------------------------- Vitals Details Patient Name: [name] Date of Service: 08/21/2019 11:00 AM Medical Record Number: [id number] Patient Account Number: [id number] Date of Birth/Sex: Sep 24, 1940 (79 y.o. F) Treating RN: Army Melia Primary Care Wrenley Sayed: Lemmie Evens Other Clinician: Referring Abdallah Hern: Lemmie Evens Treating Norita Meigs/Extender: Melburn Hake, HOYT Weeks in Treatment: 6 Vital Signs Time Taken: 10:56 Temperature (F): 97.6 Height (in): 67 Pulse (bpm): 96 Weight (lbs): 180 Respiratory Rate (breaths/min): 16 Body Mass Index (BMI): 28.2 Blood Pressure (mmHg): 181/64 Reference Range: 80 - 120 mg / dl Electronic Signature(s) Signed: 08/21/2019 11:38:05 AM By: Army Melia Entered By: Army Melia on 08/21/2019 10:58:47

## 2019-08-27 ENCOUNTER — Ambulatory Visit: Payer: Medicare Other | Admitting: Physician Assistant

## 2019-08-28 ENCOUNTER — Encounter: Payer: Medicare HMO | Admitting: Physician Assistant

## 2019-08-30 ENCOUNTER — Other Ambulatory Visit: Payer: Self-pay

## 2019-08-30 DIAGNOSIS — L97812 Non-pressure chronic ulcer of other part of right lower leg with fat layer exposed: Secondary | ICD-10-CM | POA: Diagnosis not present

## 2019-08-31 NOTE — Progress Notes (Signed)
[name], [name] ([id number]) Visit Report for 08/30/2019 Arrival Information Details Patient Name: [name]. Date of Service: 08/30/2019 2:30 PM Medical Record Number: [id number] Patient Account Number: [id number] Date of Birth/Sex: Sep 17, 1940 (79 y.o. F) Treating RN: Montey Hora Primary Care Joshuah Minella: Lemmie Evens Other Clinician: Referring Palmer Shorey: Lemmie Evens Treating Maxwell Lemen/Extender: Melburn Hake, HOYT Weeks in Treatment: 7 Visit Information History Since Last Visit Added or deleted any medications: No Patient Arrived: Walker Any new allergies or adverse reactions: No Arrival Time: 15:04 Had a fall or experienced change in No Accompanied By: self activities of daily living that may affect Transfer Assistance: None risk of falls: Patient Identification Verified: Yes Signs or symptoms of abuse/neglect since last visito No Secondary Verification Process Completed: Yes Hospitalized since last visit: No Patient Has Alerts: Yes Implantable device outside of the clinic excluding No Patient Alerts: ABI 05/23/2019 cellular tissue based products placed in the center (L)1.08 (R) 0.95 since last visit: TBI (L) 0.92 (R) 0.87 Has Dressing in Place as Prescribed: Yes Has Compression in Place as Prescribed: Yes Pain Present Now: No Notes 98.2 Electronic Signature(s) Signed: 08/30/2019 5:02:05 PM By: Montey Hora Entered By: Montey Hora on 08/30/2019 15:05:28 Strieter, [name] ([id number]) -------------------------------------------------------------------------------- Compression Therapy Details Patient Name: [name] Date of Service: 08/30/2019 2:30 PM Medical Record Number: [id number] Patient Account Number: [id number] Date of Birth/Sex: 1940/12/21 (79 y.o. F) Treating RN: Montey Hora Primary Care Kyilee Gregg: Lemmie Evens Other Clinician: Referring Concettina Leth: Lemmie Evens Treating Chardonay Scritchfield/Extender: Melburn Hake, HOYT Weeks in Treatment: 7 Compression  Therapy Performed for Wound Assessment: Wound #1 Left,Lateral Lower Leg Performed By: Clinician Montey Hora, RN Compression Type: Three Layer Pre Treatment ABI: 1.1 Electronic Signature(s) Signed: 08/30/2019 4:46:51 PM By: Montey Hora Entered By: Montey Hora on 08/30/2019 16:46:51 [name], [name] ([id number]) -------------------------------------------------------------------------------- Encounter Discharge Information Details Patient Name: [name]. Date of Service: 08/30/2019 2:30 PM Medical Record Number: [id number] Patient Account Number: [id number] Date of Birth/Sex: Feb 04, 1941 (79 y.o. F) Treating RN: Montey Hora Primary Care Anahit Klumb: Lemmie Evens Other Clinician: Referring Brynlea Spindler: Lemmie Evens Treating Becka Lagasse/Extender: Melburn Hake, HOYT Weeks in Treatment: 7 Encounter Discharge Information Items Discharge Condition: Stable Ambulatory Status: Ambulatory Discharge Destination: Home Transportation: Private Auto Accompanied By: self Schedule Follow-up Appointment: Yes Clinical Summary of Care: Electronic Signature(s) Signed: 08/30/2019 4:47:27 PM By: Montey Hora Entered By: Montey Hora on 08/30/2019 16:47:27 [name], [name] ([id number]) -------------------------------------------------------------------------------- Wound Assessment Details Patient Name: [name]. Date of Service: 08/30/2019 2:30 PM Medical Record Number: [id number] Patient Account Number: [id number] Date of Birth/Sex: 18-Mar-1941 (79 y.o. F) Treating RN: Montey Hora Primary Care Earland Reish: Lemmie Evens Other Clinician: Referring Ramey Schiff: Lemmie Evens Treating Mckennon Zwart/Extender: STONE III, HOYT Weeks in Treatment: 7 Wound Status Wound Number: 1 Primary Trauma, Other Etiology: Wound Location: Left Lower Leg - Lateral Wound Open Wounding Event: Trauma Status: Date Acquired: 06/18/2019 Comorbid Cataracts, Asthma, Chronic Obstructive Pulmonary Disease Weeks  Of Treatment: 7 History: (COPD), Arrhythmia, Coronary Artery Disease, Clustered Wound: No Hypertension, Hypotension, Myocardial Infarction, Peripheral Arterial Disease Photos Wound Measurements Length: (cm) 1.1 Width: (cm) 0.8 Depth: (cm) 0.6 Area: (cm) 0.691 Volume: (cm) 0.415 % Reduction in Area: 94.3% % Reduction in Volume: 93.2% Epithelialization: None Tunneling: No Undermining: No Wound Description Classification: Full Thickness Without Exposed Support Structu Wound Margin: Flat and Intact Exudate Amount: Medium Exudate Type: Serous Exudate Color: amber res Foul Odor After Cleansing: No Slough/Fibrino Yes Wound Bed Granulation Amount: Medium (34-66%) Exposed Structure Granulation Quality: Red Fascia Exposed: No Necrotic Amount: Medium (34-66%)  Fat Layer (Subcutaneous Tissue) Exposed: Yes Necrotic Quality: Adherent Slough Tendon Exposed: No Muscle Exposed: No Joint Exposed: No Bone Exposed: No Treatment Notes Wound #1 (Left, Lateral Lower Leg) Notes promogran with silvercel on top, ABD, 3 layer [name] ([id number]) Electronic Signature(s) Signed: 08/30/2019 5:02:05 PM By: Montey Hora Entered By: Montey Hora on 08/30/2019 15:15:44

## 2019-09-04 ENCOUNTER — Encounter: Payer: Medicare HMO | Attending: Physician Assistant | Admitting: Physician Assistant

## 2019-09-04 ENCOUNTER — Other Ambulatory Visit: Payer: Self-pay

## 2019-09-04 DIAGNOSIS — J449 Chronic obstructive pulmonary disease, unspecified: Secondary | ICD-10-CM | POA: Diagnosis not present

## 2019-09-04 DIAGNOSIS — I480 Paroxysmal atrial fibrillation: Secondary | ICD-10-CM | POA: Insufficient documentation

## 2019-09-04 DIAGNOSIS — L97829 Non-pressure chronic ulcer of other part of left lower leg with unspecified severity: Secondary | ICD-10-CM | POA: Diagnosis not present

## 2019-09-04 DIAGNOSIS — L97812 Non-pressure chronic ulcer of other part of right lower leg with fat layer exposed: Secondary | ICD-10-CM | POA: Insufficient documentation

## 2019-09-04 DIAGNOSIS — I2510 Atherosclerotic heart disease of native coronary artery without angina pectoris: Secondary | ICD-10-CM | POA: Diagnosis not present

## 2019-09-04 DIAGNOSIS — Z881 Allergy status to other antibiotic agents status: Secondary | ICD-10-CM | POA: Insufficient documentation

## 2019-09-04 DIAGNOSIS — S80811A Abrasion, right lower leg, initial encounter: Secondary | ICD-10-CM | POA: Diagnosis not present

## 2019-09-04 DIAGNOSIS — Z885 Allergy status to narcotic agent status: Secondary | ICD-10-CM | POA: Diagnosis not present

## 2019-09-04 DIAGNOSIS — I739 Peripheral vascular disease, unspecified: Secondary | ICD-10-CM | POA: Insufficient documentation

## 2019-09-04 DIAGNOSIS — I10 Essential (primary) hypertension: Secondary | ICD-10-CM | POA: Insufficient documentation

## 2019-09-04 DIAGNOSIS — F17210 Nicotine dependence, cigarettes, uncomplicated: Secondary | ICD-10-CM | POA: Diagnosis not present

## 2019-09-04 DIAGNOSIS — I252 Old myocardial infarction: Secondary | ICD-10-CM | POA: Diagnosis not present

## 2019-09-04 DIAGNOSIS — Z888 Allergy status to other drugs, medicaments and biological substances status: Secondary | ICD-10-CM | POA: Diagnosis not present

## 2019-09-04 DIAGNOSIS — S81802A Unspecified open wound, left lower leg, initial encounter: Secondary | ICD-10-CM | POA: Insufficient documentation

## 2019-09-04 DIAGNOSIS — Z880 Allergy status to penicillin: Secondary | ICD-10-CM | POA: Insufficient documentation

## 2019-09-04 DIAGNOSIS — Z882 Allergy status to sulfonamides status: Secondary | ICD-10-CM | POA: Insufficient documentation

## 2019-09-04 NOTE — Progress Notes (Signed)
JAKAIYA, [place] ([id number]) Visit Report for 09/04/2019 Arrival Information Details Patient Name: [name], [name]. Date of Service: 09/04/2019 11:00 AM Medical Record Number: [id number] Patient Account Number: [id number] Date of Birth/Sex: Apr 24, 1941 (79 y.o. F) Treating RN: Army Melia Primary Care Massiel Stipp: Lemmie Evens Other Clinician: Referring Makenzi Bannister: Lemmie Evens Treating Madina Galati/Extender: Melburn Hake, HOYT Weeks in Treatment: 8 Visit Information History Since Last Visit Added or deleted any medications: No Patient Arrived: Walker Any new allergies or adverse reactions: No Arrival Time: 11:01 Had a fall or experienced change in No Accompanied By: self activities of daily living that may affect Transfer Assistance: None risk of falls: Patient Identification Verified: Yes Signs or symptoms of abuse/neglect since last visito No Patient Has Alerts: Yes Hospitalized since last visit: No Patient Alerts: ABI 05/23/2019 Has Dressing in Place as Prescribed: Yes (L)1.08 (R) 0.95 Pain Present Now: Yes TBI (L) 0.92 (R) 0.87 Electronic Signature(s) Signed: 09/04/2019 4:27:58 PM By: Army Melia Entered By: Army Melia on 09/04/2019 11:02:10 Killilea, [name] ([id number]) -------------------------------------------------------------------------------- Compression Therapy Details Patient Name: [name] Date of Service: 09/04/2019 11:00 AM Medical Record Number: [id number] Patient Account Number: [id number] Date of Birth/Sex: [birth year] (79 y.o. F) Treating RN: Montey Hora Primary Care Simran Mannis: Lemmie Evens Other Clinician: Referring Cherise Fedder: Lemmie Evens Treating Adeena Bernabe/Extender: Melburn Hake, HOYT Weeks in Treatment: 8 Compression Therapy Performed for Wound Assessment: Wound #1 Left,Lateral Lower Leg Performed By: Clinician Montey Hora, RN Compression Type: Three Layer Pre Treatment ABI: 0.9 Post Procedure Diagnosis Same as Pre-procedure Electronic  Signature(s) Signed: 09/04/2019 4:45:34 PM By: Montey Hora Entered By: Montey Hora on 09/04/2019 11:18:23 Tegtmeyer, [name] ([id number]) -------------------------------------------------------------------------------- Encounter Discharge Information Details Patient Name: [name], [name]. Date of Service: 09/04/2019 11:00 AM Medical Record Number: [id number] Patient Account Number: [id number] Date of Birth/Sex: [birth year] (79 y.o. F) Treating RN: Montey Hora Primary Care Wheeler Incorvaia: Lemmie Evens Other Clinician: Referring Yamari Ventola: Lemmie Evens Treating Yuleimy Kretz/Extender: Melburn Hake, HOYT Weeks in Treatment: 8 Encounter Discharge Information Items Discharge Condition: Stable Ambulatory Status: Ambulatory Discharge Destination: Home Transportation: Private Auto Accompanied By: self Schedule Follow-up Appointment: Yes Clinical Summary of Care: Electronic Signature(s) Signed: 09/04/2019 11:27:32 AM By: Montey Hora Entered By: Montey Hora on 09/04/2019 11:27:31 Chachere, [name] ([id number]) -------------------------------------------------------------------------------- Lower Extremity Assessment Details Patient Name: [name]. Date of Service: 09/04/2019 11:00 AM Medical Record Number: [id number] Patient Account Number: [id number] Date of Birth/Sex: 1941/04/10 (79 y.o. F) Treating RN: Army Melia Primary Care Burke Terry: Lemmie Evens Other Clinician: Referring Hilarie Sinha: Lemmie Evens Treating Verlinda Slotnick/Extender: STONE III, HOYT Weeks in Treatment: 8 Edema Assessment Assessed: [Left: No] [Right: No] Edema: [Left: N] [Right: o] Calf Left: Right: Point of Measurement: 30 cm From Medial Instep 35 cm cm Ankle Left: Right: Point of Measurement: 10 cm From Medial Instep 20 cm cm Vascular Assessment Pulses: Dorsalis Pedis Palpable: [Left:Yes] Electronic Signature(s) Signed: 09/04/2019 4:27:58 PM By: Army Melia Entered By: Army Melia on 09/04/2019  11:11:41 Kneece, [name] ([id number]) -------------------------------------------------------------------------------- Multi Wound Chart Details Patient Name: [name]. Date of Service: 09/04/2019 11:00 AM Medical Record Number: [id number] Patient Account Number: [id number] Date of Birth/Sex: 09-02-1940 (79 y.o. F) Treating RN: Montey Hora Primary Care Anthoni Geerts: Lemmie Evens Other Clinician: Referring Aniello Christopoulos: Lemmie Evens Treating Bunnie Lederman/Extender: STONE III, HOYT Weeks in Treatment: 8 Vital Signs Height(in): 67 Pulse(bpm): 60 Weight(lbs): 180 Blood Pressure(mmHg): 159/75 Body Mass Index(BMI): 28 Temperature(F): 97.7 Respiratory Rate(breaths/min): 16 Photos: [N/A:N/A] Wound Location: Left Lower Leg - Lateral N/A N/A Wounding Event: Trauma N/A N/A Primary Etiology: Trauma,  Other N/A N/A Comorbid History: Cataracts, Asthma, Chronic N/A N/A Obstructive Pulmonary Disease (COPD), Arrhythmia, Coronary Artery Disease, Hypertension, Hypotension, Myocardial Infarction, Peripheral Arterial Disease Date Acquired: 06/18/2019 N/A N/A Weeks of Treatment: 8 N/A N/A Wound Status: Open N/A N/A Measurements L x W x D (cm) 0.7x0.5x0.5 N/A N/A Area (cm) : 0.275 N/A N/A Volume (cm) : 0.137 N/A N/A % Reduction in Area: 97.70% N/A N/A % Reduction in Volume: 97.70% N/A N/A Classification: Full Thickness Without Exposed N/A N/A Support Structures Exudate Amount: Medium N/A N/A Exudate Type: Serous N/A N/A Exudate Color: amber N/A N/A Wound Margin: Flat and Intact N/A N/A Granulation Amount: Medium (34-66%) N/A N/A Granulation Quality: Red N/A N/A Necrotic Amount: Medium (34-66%) N/A N/A Exposed Structures: Fat Layer (Subcutaneous Tissue) N/A N/A Exposed: Yes Fascia: No Tendon: No Muscle: No Joint: No Bone: No Epithelialization: None N/A N/A Treatment Notes [name], [name] ([id number]) Electronic Signature(s) Signed: 09/04/2019 4:45:34 PM By: Montey Hora Entered  By: Montey Hora on 09/04/2019 11:17:16 Chow, [name] ([id number]) -------------------------------------------------------------------------------- [place] Details Patient Name: [name], [name]. Date of Service: 09/04/2019 11:00 AM Medical Record Number: [id number] Patient Account Number: [id number] Date of Birth/Sex: 23-Apr-1941 (79 y.o. F) Treating RN: Montey Hora Primary Care Leslee Haueter: Lemmie Evens Other Clinician: Referring Azure Barrales: Lemmie Evens Treating Maurisio Ruddy/Extender: Melburn Hake, HOYT Weeks in Treatment: 8 Active Inactive Abuse / Safety / Falls / Self Care Management Nursing Diagnoses: Potential for falls Goals: Patient will not experience any injury related to falls Date Initiated: 07/10/2019 Target Resolution Date: 10/13/2019 Goal Status: Active Interventions: Assess fall risk on admission and as needed Notes: Necrotic Tissue Nursing Diagnoses: Impaired tissue integrity related to necrotic/devitalized tissue Goals: Necrotic/devitalized tissue will be minimized in the wound bed Date Initiated: 07/10/2019 Target Resolution Date: 10/13/2019 Goal Status: Active Interventions: Provide education on necrotic tissue and debridement process Notes: Orientation to the Wound Care Program Nursing Diagnoses: Knowledge deficit related to the wound healing center program Goals: Patient/caregiver will verbalize understanding of the [facility] Date Initiated: 07/10/2019 Target Resolution Date: 10/13/2019 Goal Status: Active Interventions: Provide education on orientation to the wound center Notes: Wound/Skin Impairment Nursing Diagnoses: Impaired tissue integrity [name], [name] ([id number]) Goals: Ulcer/skin breakdown will heal within 14 weeks Date Initiated: 07/10/2019 Target Resolution Date: 10/13/2019 Goal Status: Active Interventions: Assess patient/caregiver ability to obtain necessary supplies Assess patient/caregiver  ability to perform ulcer/skin care regimen upon admission and as needed Assess ulceration(s) every visit Notes: Electronic Signature(s) Signed: 09/04/2019 4:45:34 PM By: Montey Hora Entered By: Montey Hora on 09/04/2019 11:17:07 Schorsch, [name] ([id number]) -------------------------------------------------------------------------------- Pain Assessment Details Patient Name: [name]. Date of Service: 09/04/2019 11:00 AM Medical Record Number: [id number] Patient Account Number: [id number] Date of Birth/Sex: Jul 30, 1940 (79 y.o. F) Treating RN: Army Melia Primary Care Aymen Widrig: Lemmie Evens Other Clinician: Referring Matty Vanroekel: Lemmie Evens Treating Sarh Kirschenbaum/Extender: Melburn Hake, HOYT Weeks in Treatment: 8 Active Problems Location of Pain Severity and Description of Pain Patient Has Paino Yes Site Locations Pain Location: Pain in Ulcers Rate the pain. Current Pain Level: 8 Pain Management and Medication Current Pain Management: Electronic Signature(s) Signed: 09/04/2019 4:27:58 PM By: Army Melia Entered By: Army Melia on 09/04/2019 11:02:47 Rossini, [name] ([id number]) -------------------------------------------------------------------------------- Patient/Caregiver Education Details Patient Name: [name] Date of Service: 09/04/2019 11:00 AM Medical Record Number: [id number] Patient Account Number: [id number] Date of Birth/Gender: 08-19-1940 (79 y.o. F) Treating RN: Montey Hora Primary Care Physician: Lemmie Evens Other Clinician: Referring Physician: Lemmie Evens Treating Physician/Extender: Melburn Hake,  HOYT Weeks in Treatment: 8 Education Assessment Education Provided To: Patient Education Topics Provided Venous: Handouts: Other: leg elevation Methods: Explain/Verbal Responses: State content correctly Electronic Signature(s) Signed: 09/04/2019 4:45:34 PM By: Montey Hora Entered By: Montey Hora on 09/04/2019 11:24:52 Klos, [name] ([id number]) -------------------------------------------------------------------------------- Wound Assessment Details Patient Name: [name]. Date of Service: 09/04/2019 11:00 AM Medical Record Number: [id number] Patient Account Number: [id number] Date of Birth/Sex: 08/04/1940 (79 y.o. F) Treating RN: Army Melia Primary Care [place] Maulden: Lemmie Evens Other Clinician: Referring Wheeler Incorvaia: Lemmie Evens Treating Lanyah Spengler/Extender: STONE III, HOYT Weeks in Treatment: 8 Wound Status Wound Number: 1 Primary Trauma, Other Etiology: Wound Location: Left Lower Leg - Lateral Wound Open Wounding Event: Trauma Status: Date Acquired: 06/18/2019 Comorbid Cataracts, Asthma, Chronic Obstructive Pulmonary Disease Weeks Of Treatment: 8 History: (COPD), Arrhythmia, Coronary Artery Disease, Hypertension, Clustered Wound: No Hypotension, Myocardial Infarction, Peripheral Arterial Disease Photos Wound Measurements Length: (cm) 0.7 Width: (cm) 0.5 Depth: (cm) 0.5 Area: (cm) 0.275 Volume: (cm) 0.137 % Reduction in Area: 97.7% % Reduction in Volume: 97.7% Epithelialization: None Tunneling: No Undermining: No Wound Description Classification: Full Thickness Without Exposed Support Structu Wound Margin: Flat and Intact Exudate Amount: Medium Exudate Type: Serous Exudate Color: amber res Foul Odor After Cleansing: No Slough/Fibrino Yes Wound Bed Granulation Amount: Medium (34-66%) Exposed Structure Granulation Quality: Red Fascia Exposed: No Necrotic Amount: Medium (34-66%) Fat Layer (Subcutaneous Tissue) Exposed: Yes Necrotic Quality: Adherent Slough Tendon Exposed: No Muscle Exposed: No Joint Exposed: No Bone Exposed: No Treatment Notes Wound #1 (Left, Lateral Lower Leg) Notes prisma, ABD, 3 layer [name], [name] ([id number]) Electronic Signature(s) Signed: 09/04/2019 4:27:58 PM By: Army Melia Entered By: Army Melia on 09/04/2019 11:10:35 Jepsen, [name]  ([id number]) -------------------------------------------------------------------------------- Vitals Details Patient Name: [name] Date of Service: 09/04/2019 11:00 AM Medical Record Number: [id number] Patient Account Number: [id number] Date of Birth/Sex: June 17, 1941 (79 y.o. F) Treating RN: Army Melia Primary Care Kingdom Vanzanten: Lemmie Evens Other Clinician: Referring Morayo Leven: Lemmie Evens Treating Yeily Link/Extender: Melburn Hake, HOYT Weeks in Treatment: 8 Vital Signs Time Taken: 11:02 Temperature (F): 97.7 Height (in): 67 Pulse (bpm): 84 Weight (lbs): 180 Respiratory Rate (breaths/min): 16 Body Mass Index (BMI): 28.2 Blood Pressure (mmHg): 159/75 Reference Range: 80 - 120 mg / dl Electronic Signature(s) Signed: 09/04/2019 4:27:58 PM By: Army Melia Entered By: Army Melia on 09/04/2019 11:03:05

## 2019-09-04 NOTE — Progress Notes (Addendum)
TARRY, BLAYNEY ([id number]) Visit Report for 09/04/2019 Chief Complaint Document Details Patient Name: [name], [name]. Date of Service: 09/04/2019 11:00 AM Medical Record Number: [id number] Patient Account Number: [id number] Date of Birth/Sex: 07-16-1940 (79 y.o. F) Treating RN: Montey Hora Primary Care Provider: Lemmie Evens Other Clinician: Referring Provider: Lemmie Evens Treating Provider/Extender: Melburn Hake, Jamontae Thwaites Weeks in Treatment: 8 Information Obtained from: Patient Chief Complaint Bilateral LE ulcers Electronic Signature(s) Signed: 09/04/2019 11:08:26 AM By: Worthy Keeler PA-C Entered By: Worthy Keeler on 09/04/2019 11:08:25 Lysne, [name] ([id number]) -------------------------------------------------------------------------------- HPI Details Patient Name: [name] Date of Service: 09/04/2019 11:00 AM Medical Record Number: [id number] Patient Account Number: [id number] Date of Birth/Sex: 12-10-1940 (79 y.o. F) Treating RN: Montey Hora Primary Care Provider: Lemmie Evens Other Clinician: Referring Provider: Lemmie Evens Treating Provider/Extender: Melburn Hake, Audria Takeshita Weeks in Treatment: 8 History of Present Illness HPI Description: 07/10/2019 on evaluation today patient presents for initial inspection here in our clinic concerning wounds that she sustained as a result of traumatic injuries to her left lower leg as well as her right lower leg. The right side is much less severe the left side actually has muscle exposed. This left side she actually had in a car door and I think it did tear into her quite a bit more than the trauma wound which was more of an abrasion on the right lower extremity. Nonetheless she is having a lot of drainage from the left. This is the one obviously that is deeper down to the muscle area. With that being said the patient also has paroxysmal atrial fibrillation, hypertension, coronary artery disease, peripheral vascular disease,  and nicotine dependence. Currently she is not having a lot of severe pain although there is some discomfort. No fevers, chills, nausea, vomiting, or diarrhea. Patient did have arterial studies performed on 05/23/2019 which revealed a left ABI of 1.08 and a right ABI of 0.95. 07/17/2019 on evaluation today patient appears to be doing quite well with regard to her wounds. She has been showing signs of actually dramatic improvement in just 1 week in my opinion. In fact even the area where the muscle is exposed is showing already good signs of new granulation tissue over top of the muscle which is exactly what we want to see. Overall I am very pleased at this point. 07/24/2019 upon evaluation today patient appears to be doing well with regard to her wounds. In fact the right lower extremity ulcer appears to be almost completely healed which is great news some of the dressing material stuck to the periwound region. In regard to the left lower extremity that is showing signs of improvement at slow but nonetheless is still making improvement at this point. 08/02/2019 upon evaluation today patient appears to be doing well with regard to her wounds. The right leg ulcer is actually completely healed the left leg ulcer though not healed is doing significantly better. I am very pleased with how things seem to be progressing. Especially in light of how deep this wound was initially. 08/07/19 upon evaluation today patient actually appears to be showing signs of good improvement which is excellent news. There does not appear to be any evidence of active infection which is also excellent news. Overall I'm pleased with the healing prospects currently. I do believe that this is going to continue to take some time but nonetheless I do feel like she's also making excellent improvement especially considering the depth and extent of her wound initially. 08/14/2019 on  evaluation today patient appears to be doing well with regard  to her wound. She has been tolerating the dressing changes without complication other than the fact that she states it feels like it is kind of hurting her at the top of the wrap. This may be due to the indications. Fortunately there is no signs of active infection at this time which is good news. No fevers, chills, nausea, vomiting, or diarrhea. Unfortunately she is having some issues with her breathing she states that she called her primary care office and they told her that her primary care provider, Dr. Meda Coffee, would not be back until August. That seems somewhat extreme to me but she was told if she had any issues she needed to go to the ER according to the patient. I contacted their office today actually spoke with both apparently Dr. Vickey Sages wife as well as the scheduler and got the patient appointment for tomorrow at 2. I am not sure who told her the other information. 08/21/2019 upon evaluation today patient appears to be doing very well with regard to the wound on her lower extremity. She has been tolerating the dressing changes without complication will be using a compression wrap but overall she seems to be tolerating this quite well. I am very pleased with how things are progressing. The patient likewise is happy to see improvement. I do think she could potentially benefit from a snap VAC that something that we can check into although again I am not sure that her insurance will cover this or if they do how much her portion may be. We will definitely let her know once we get the results back from authorization. 09/04/2019 on evaluation today patient actually appears to be doing quite well with regard to her wound. She has been tolerating the dressing changes without complication. Fortunately there is no signs of active infection at this time. No fevers, chills, nausea, vomiting, or diarrhea.. With that being said I do believe that the patient at this point is going to likely heal fairly soon  and I do not even see any need for sharp debridement today which is great news. Electronic Signature(s) Signed: 09/04/2019 1:17:38 PM By: Worthy Keeler PA-C Entered By: Worthy Keeler on 09/04/2019 13:17:38 Curto, [name] ([id number]) -------------------------------------------------------------------------------- Physical Exam Details Patient Name: JULIUS, BONIFACE. Date of Service: 09/04/2019 11:00 AM Medical Record Number: [id number] Patient Account Number: [id number] Date of Birth/Sex: Jul 06, 1940 (79 y.o. F) Treating RN: Montey Hora Primary Care Provider: Lemmie Evens Other Clinician: Referring Provider: Lemmie Evens Treating Provider/Extender: STONE III, Ransom Nickson Weeks in Treatment: 8 Constitutional Well-nourished and well-hydrated in no acute distress. Respiratory normal breathing without difficulty. Psychiatric this patient is able to make decisions and demonstrates good insight into disease process. Alert and Oriented x 3. pleasant and cooperative. Notes His wound bed currently showed signs of good granulation at this point. There was some biofilm noted on the surface of the wound I was able to mechanically debride this away with saline and gauze no sharp debridement was necessary and overall the patient seems to be doing quite well. Electronic Signature(s) Signed: 09/04/2019 1:17:53 PM By: Worthy Keeler PA-C Entered By: Worthy Keeler on 09/04/2019 13:17:53 Boomhower, [name] ([id number]) -------------------------------------------------------------------------------- Physician Orders Details Patient Name: KEMARA, QUIGLEY Date of Service: 09/04/2019 11:00 AM Medical Record Number: [id number] Patient Account Number: [id number] Date of Birth/Sex: [birth year] (79 y.o. 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 - Please do not get your wraps wet Primary Wound Dressing o Silver Collagen Secondary Dressing Wound #1 Left,Lateral Lower Leg o ABD pad Dressing Change Frequency Wound #1 Left,Lateral Lower Leg o Change dressing every week Follow-up Appointments Wound #1 Left,Lateral Lower Leg o Return Appointment in 1 week. Edema Control Wound #1 Left,Lateral Lower Leg o 3 Layer Compression System - Left Lower Extremity Electronic Signature(s) Signed: 09/04/2019 4:45:34 PM By: Montey Hora Signed: 09/06/2019 5:37:52 PM By: Worthy Keeler PA-C Entered By: Montey Hora on 09/04/2019 11:18:53 Stockert, [name] ([id number]) -------------------------------------------------------------------------------- Problem List Details Patient Name: PACHIA, STRUM. Date of Service: 09/04/2019 11:00 AM Medical Record Number: [id number] Patient Account Number: [id number] Date of Birth/Sex: 1941-04-23 (79 y.o. F) Treating RN: Montey Hora Primary Care Provider: Lemmie Evens Other Clinician: Referring Provider: Lemmie Evens Treating Provider/Extender: Melburn Hake, Shakeema Lippman Weeks in Treatment:  8 Active Problems ICD-10 Evaluated Encounter Code Description Active Date Today Diagnosis S81.802A Unspecified open wound, left lower leg, initial encounter 07/10/2019 No Yes S81.801A Unspecified open wound, right lower leg, initial encounter 07/10/2019 No Yes L97.825 Non-pressure chronic ulcer of other part of left lower leg with muscle 07/10/2019 No Yes involvement without evidence of necrosis L97.812 Non-pressure chronic ulcer of other part of right lower leg with fat layer 07/10/2019 No Yes exposed I48.0 Paroxysmal atrial fibrillation 07/10/2019 No Yes I10 Essential (primary) hypertension 07/10/2019 No Yes I25.10 Atherosclerotic heart disease of native coronary artery without angina 07/10/2019 No Yes pectoris I73.89 Other specified peripheral vascular diseases 07/10/2019 No Yes F17.218 Nicotine dependence, cigarettes, with other nicotine-induced disorders 07/10/2019 No Yes Inactive Problems Resolved Problems Electronic Signature(s) Signed: 09/04/2019 11:08:18 AM By: Worthy Keeler PA-C Entered By: Worthy Keeler on 09/04/2019 11:08:17 PATINA, SPANIER ([id number]) Palmeri, [name] ([id number]) -------------------------------------------------------------------------------- Progress Note Details Patient Name: [name]. Date of Service: 09/04/2019 11:00 AM Medical Record Number: [id number] Patient Account Number: [id number] Date of Birth/Sex: Jul 04, 1941 (79 y.o. F) Treating RN: Montey Hora Primary Care Provider: Lemmie Evens Other Clinician: Referring Provider: Lemmie Evens Treating Provider/Extender: Melburn Hake, Xane Amsden Weeks in Treatment: 8 Subjective Chief Complaint Information obtained from Patient Bilateral LE ulcers History of Present Illness (HPI) 07/10/2019 on evaluation today patient presents for initial inspection here in our clinic concerning wounds that she sustained as a result of traumatic injuries to her left lower leg as well as her right lower leg. The right side is  much less severe the left side actually has muscle exposed. This left side she actually had in a car door and I think it did tear into her quite a bit more than the trauma wound which was more of an abrasion on the right lower extremity. Nonetheless she is having a lot of drainage from the left. This is the  one obviously that is deeper down to the muscle area. With that being said the patient also has paroxysmal atrial fibrillation, hypertension, coronary artery disease, peripheral vascular disease, and nicotine dependence. Currently she is not having a lot of severe pain although there is some discomfort. No fevers, chills, nausea, vomiting, or diarrhea. Patient did have arterial studies performed on 05/23/2019 which revealed a left ABI of 1.08 and a right ABI of 0.95. 07/17/2019 on evaluation today patient appears to be doing quite well with regard to her wounds. She has been showing signs of actually dramatic improvement in just 1 week in my opinion. In fact even the area where the muscle is exposed is showing already good signs of new granulation tissue over top of the muscle which is exactly what we want to see. Overall I am very pleased at this point. 07/24/2019 upon evaluation today patient appears to be doing well with regard to her wounds. In fact the right lower extremity ulcer appears to be almost completely healed which is great news some of the dressing material stuck to the periwound region. In regard to the left lower extremity that is showing signs of improvement at slow but nonetheless is still making improvement at this point. 08/02/2019 upon evaluation today patient appears to be doing well with regard to her wounds. The right leg ulcer is actually completely healed the left leg ulcer though not healed is doing significantly better. I am very pleased with how things seem to be progressing. Especially in light of how deep this wound was initially. 08/07/19 upon evaluation today patient  actually appears to be showing signs of good improvement which is excellent news. There does not appear to be any evidence of active infection which is also excellent news. Overall I'm pleased with the healing prospects currently. I do believe that this is going to continue to take some time but nonetheless I do feel like she's also making excellent improvement especially considering the depth and extent of her wound initially. 08/14/2019 on evaluation today patient appears to be doing well with regard to her wound. She has been tolerating the dressing changes without complication other than the fact that she states it feels like it is kind of hurting her at the top of the wrap. This may be due to the indications. Fortunately there is no signs of active infection at this time which is good news. No fevers, chills, nausea, vomiting, or diarrhea. Unfortunately she is having some issues with her breathing she states that she called her primary care office and they told her that her primary care provider, Dr. Meda Coffee, would not be back until August. That seems somewhat extreme to me but she was told if she had any issues she needed to go to the ER according to the patient. I contacted their office today actually spoke with both apparently Dr. Vickey Sages wife as well as the scheduler and got the patient appointment for tomorrow at 2. I am not sure who told her the other information. 08/21/2019 upon evaluation today patient appears to be doing very well with regard to the wound on her lower extremity. She has been tolerating the dressing changes without complication will be using a compression wrap but overall she seems to be tolerating this quite well. I am very pleased with how things are progressing. The patient likewise is happy to see improvement. I do think she could potentially benefit from a snap VAC that something that we can check into although again I  am not sure that her insurance will cover this or  if they do how much her portion may be. We will definitely let her know once we get the results back from authorization. 09/04/2019 on evaluation today patient actually appears to be doing quite well with regard to her wound. She has been tolerating the dressing changes without complication. Fortunately there is no signs of active infection at this time. No fevers, chills, nausea, vomiting, or diarrhea.. With that being said I do believe that the patient at this point is going to likely heal fairly soon and I do not even see any need for sharp debridement today which is great news. JENNIFE, ZAUCHA ([id number]) Objective Constitutional Well-nourished and well-hydrated in no acute distress. Vitals Time Taken: 11:02 AM, Height: 67 in, Weight: 180 lbs, BMI: 28.2, Temperature: 97.7 F, Pulse: 84 bpm, Respiratory Rate: 16 breaths/min, Blood Pressure: 159/75 mmHg. Respiratory normal breathing without difficulty. Psychiatric this patient is able to make decisions and demonstrates good insight into disease process. Alert and Oriented x 3. pleasant and cooperative. General Notes: His wound bed currently showed signs of good granulation at this point. There was some biofilm noted on the surface of the wound I was able to mechanically debride this away with saline and gauze no sharp debridement was necessary and overall the patient seems to be doing quite well. Integumentary (Hair, Skin) Wound #1 status is Open. Original cause of wound was Trauma. The wound is located on the Left,Lateral Lower Leg. The wound measures 0.7cm length x 0.5cm width x 0.5cm depth; 0.275cm^2 area and 0.137cm^3 volume. There is Fat Layer (Subcutaneous Tissue) Exposed exposed. There is no tunneling or undermining noted. There is a medium amount of serous drainage noted. The wound margin is flat and intact. There is medium (34- 66%) red granulation within the wound bed. There is a medium (34-66%) amount of necrotic tissue within the  wound bed including Adherent Slough. 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 There was a Three Layer Compression Therapy Procedure with a pre-treatment ABI of 0.9 by Montey Hora, RN. Post procedure Diagnosis Wound #1: Same as Pre-Procedure Plan Wound Cleansing: Wound #1 Left,Lateral Lower Leg: Cleanse wound with mild soap and water May shower with protection. - Please do not get your wraps wet Primary Wound Dressing: KERRILYN, AZBILL ([id number]) Silver Collagen Secondary Dressing: Wound #1 Left,Lateral Lower Leg: ABD pad Dressing Change Frequency: Wound #1 Left,Lateral Lower Leg: Change dressing every week Follow-up Appointments: Wound #1 Left,Lateral Lower Leg: Return Appointment in 1 week. Edema Control: Wound #1 Left,Lateral Lower Leg: 3 Layer Compression System - Left Lower Extremity 1. I would recommend currently that we go ahead and continue with the wound care measures at this point utilizing the silver collagen. I would recommend that we not use the alginate any longer as I do not feel like that is good to be necessary. There is really no significant depth anymore. 2. I am in a suggest as  well that we continue with the compression wrap and this is a 3 layer compression wrap to left lower  extremity. 3. I am to recommend the patient continue to elevate her legs is much as possible. We will see patient back for reevaluation in 1 week here in the clinic. If anything worsens or changes patient will contact our office for additional recommendations. Electronic Signature(s) Signed: 09/04/2019 1:18:35 PM By: Worthy Keeler PA-C Entered By: Worthy Keeler on 09/04/2019 13:18:35 Rebuck, [name] ([id number]) -------------------------------------------------------------------------------- SuperBill Details Patient Name: YARELLI, DECELLES Date of Service: 09/04/2019 Medical Record Number: [id number] Patient Account Number: [id number] Date of Birth/Sex: 1940/08/25 (79 y.o. 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 L97.812 Non-pressure chronic ulcer of other part of right lower leg with fat layer exposed I48.0 Paroxysmal atrial fibrillation I10 Essential (primary) hypertension I25.10 Atherosclerotic heart disease of native coronary artery without angina pectoris I73.89 Other specified peripheral vascular diseases F17.218 Nicotine dependence, cigarettes, with other nicotine-induced disorders Facility Procedures CPT4 Code: 26948546 Description: (Facility Use Only) 29581LT - [facility] LWR LT LEG Modifier: Quantity: 1 Physician Procedures CPT4 Code Description: [id number] 93818 - WC PHYS LEVEL 3 - EST PT Modifier: Quantity: 1 CPT4 Code Description: ICD-10 Diagnosis Description S81.802A Unspecified open wound, left lower leg, initial  encounter S81.801A Unspecified open wound, right lower leg, initial encounter L97.825 Non-pressure chronic ulcer of other part of left lower leg  with muscle involv L97.812 Non-pressure chronic ulcer of other part of right lower leg with fat layer ex Modifier: ement without evidenc posed Quantity: e of necrosis Electronic Signature(s) Signed: 09/04/2019 1:18:53 PM By: Worthy Keeler PA-C Entered By: Worthy Keeler on 09/04/2019 13:18:53

## 2019-09-11 ENCOUNTER — Encounter: Payer: Medicare HMO | Admitting: Physician Assistant

## 2019-09-11 ENCOUNTER — Other Ambulatory Visit: Payer: Self-pay

## 2019-09-11 DIAGNOSIS — L97812 Non-pressure chronic ulcer of other part of right lower leg with fat layer exposed: Secondary | ICD-10-CM | POA: Diagnosis not present

## 2019-09-11 NOTE — Progress Notes (Addendum)
[name] ([id number]) Visit Report for 09/11/2019 Chief Complaint Document Details Patient Name: [name], [name]. Date of Service: 09/11/2019 11:00 AM Medical Record Number: [id number] Patient Account Number: [id number] Date of Birth/Sex: 20-Nov-1940 (79 y.o. F) Treating RN: Army Melia Primary Care Provider: Lemmie Evens Other Clinician: Referring Provider: Lemmie Evens Treating Provider/Extender: Melburn Hake, Shaya Altamura Weeks in Treatment: 9 Information Obtained from: Patient Chief Complaint Bilateral LE ulcers Electronic Signature(s) Signed: 09/11/2019 11:16:45 AM By: Worthy Keeler PA-C Entered By: Worthy Keeler on 09/11/2019 11:16:45 Teague, [name] ([id number]) -------------------------------------------------------------------------------- HPI Details Patient Name: [name], [name] Date of Service: 09/11/2019 11:00 AM Medical Record Number: [id number] Patient Account Number: [id number] Date of Birth/Sex: 1941-06-02 (79 y.o. F) Treating RN: Army Melia Primary Care Provider: Lemmie Evens Other Clinician: Referring Provider: Lemmie Evens Treating Provider/Extender: Melburn Hake, Erum Cercone Weeks in Treatment: 9 History of Present Illness HPI Description: 07/10/2019 on evaluation today patient presents for initial inspection here in our clinic concerning wounds that she sustained as a result of traumatic injuries to her left lower leg as well as her right lower leg. The right side is much less severe the left side actually has muscle exposed. This left side she actually had in a car door and I think it did tear into her quite a bit more than the trauma wound which was more of an abrasion on the right lower extremity. Nonetheless she is having a lot of drainage from the left. This is the one obviously that is deeper down to the muscle area. With that being said the patient also has paroxysmal atrial fibrillation, hypertension, coronary artery disease, peripheral vascular disease, and  nicotine dependence. Currently she is not having a lot of severe pain although there is some discomfort. No fevers, chills, nausea, vomiting, or diarrhea. Patient did have arterial studies performed on 05/23/2019 which revealed a left ABI of 1.08 and a right ABI of 0.95. 07/17/2019 on evaluation today patient appears to be doing quite well with regard to her wounds. She has been showing signs of actually dramatic improvement in just 1 week in my opinion. In fact even the area where the muscle is exposed is showing already good signs of new granulation tissue over top of the muscle which is exactly what we want to see. Overall I am very pleased at this point. 07/24/2019 upon evaluation today patient appears to be doing well with regard to her wounds. In fact the right lower extremity ulcer appears to be almost completely healed which is great news some of the dressing material stuck to the periwound region. In regard to the left lower extremity that is showing signs of improvement at slow but nonetheless is still making improvement at this point. 08/02/2019 upon evaluation today patient appears to be doing well with regard to her wounds. The right leg ulcer is actually completely healed the left leg ulcer though not healed is doing significantly better. I am very pleased with how things seem to be progressing. Especially in light of how deep this wound was initially. 08/07/19 upon evaluation today patient actually appears to be showing signs of good improvement which is excellent news. There does not appear to be any evidence of active infection which is also excellent news. Overall I'm pleased with the healing prospects currently. I do believe that this is going to continue to take some time but nonetheless I do feel like she's also making excellent improvement especially considering the depth and extent of her wound initially. 08/14/2019 on  evaluation today patient appears to be doing well with regard to  her wound. She has been tolerating the dressing changes without complication other than the fact that she states it feels like it is kind of hurting her at the top of the wrap. This may be due to the indications. Fortunately there is no signs of active infection at this time which is good news. No fevers, chills, nausea, vomiting, or diarrhea. Unfortunately she is having some issues with her breathing she states that she called her primary care office and they told her that her primary care provider, Dr. Meda Coffee, would not be back until August. That seems somewhat extreme to me but she was told if she had any issues she needed to go to the ER according to the patient. I contacted their office today actually spoke with both apparently Dr. Vickey Sages wife as well as the scheduler and got the patient appointment for tomorrow at 2. I am not sure who told her the other information. 08/21/2019 upon evaluation today patient appears to be doing very well with regard to the wound on her lower extremity. She has been tolerating the dressing changes without complication will be using a compression wrap but overall she seems to be tolerating this quite well. I am very pleased with how things are progressing. The patient likewise is happy to see improvement. I do think she could potentially benefit from a snap VAC that something that we can check into although again I am not sure that her insurance will cover this or if they do how much her portion may be. We will definitely let her know once we get the results back from authorization. 09/04/2019 on evaluation today patient actually appears to be doing quite well with regard to her wound. She has been tolerating the dressing changes without complication. Fortunately there is no signs of active infection at this time. No fevers, chills, nausea, vomiting, or diarrhea.. With that being said I do believe that the patient at this point is going to likely heal fairly soon  and I do not even see any need for sharp debridement today which is great news. 09/11/2019 upon evaluation today patient appears to actually be doing quite well with regard to her wound she has been tolerating the dressing changes without complication the wound is measuring better even compared to last week overall very pleased with the progress up to this point. Electronic Signature(s) Signed: 09/11/2019 11:22:40 AM By: Worthy Keeler PA-C Entered By: Worthy Keeler on 09/11/2019 11:22:40 [name] ([id number]) -------------------------------------------------------------------------------- Physical Exam Details Patient Name: DENYA, BUCKINGHAM. Date of Service: 09/11/2019 11:00 AM Medical Record Number: [id number] Patient Account Number: [id number] Date of Birth/Sex: Jul 12, 1940 (79 y.o. F) Treating RN: Army Melia Primary Care Provider: Lemmie Evens Other Clinician: Referring Provider: Lemmie Evens Treating Provider/Extender: STONE III, Aymen Widrig Weeks in Treatment: 9 Constitutional Well-nourished and well-hydrated in no acute distress. Respiratory normal breathing without difficulty. Psychiatric this patient is able to make decisions and demonstrates good insight into disease process. Alert and Oriented x 3. pleasant and cooperative. Notes Patient's wound upon inspection today did have some slough in the surface of the wound I was able to gently mechanically debride this away no sharp debridement was necessary. There is no undermining and overall patient is doing quite well with a lot of new skin growth. I think we are very close to getting this to completely close.. Electronic Signature(s) Signed: 09/11/2019 11:24:29 AM By: Worthy Keeler PA-C Entered  By: Worthy Keeler on 09/11/2019 11:24:28 [name] ([id number]) -------------------------------------------------------------------------------- Physician Orders Details Patient Name: [name] Date of Service:  09/11/2019 11:00 AM Medical Record Number: [id number] Patient Account Number: [id number] Date of Birth/Sex: [birth year] (79 y.o. 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 - Please do not get your wraps wet Primary Wound Dressing o Silver Collagen Secondary Dressing Wound #1 Left,Lateral Lower Leg o ABD pad Dressing Change Frequency Wound #1 Left,Lateral Lower Leg o Change dressing every week Follow-up Appointments Wound #1 Left,Lateral Lower Leg o Return Appointment in 1 week. Edema Control Wound #1 Left,Lateral Lower Leg o 3 Layer Compression System - Left Lower Extremity Electronic Signature(s) Signed: 09/11/2019 4:36:26 PM By: Montey Hora Signed: 09/11/2019 4:47:23 PM By: Worthy Keeler PA-C Entered By: Montey Hora on 09/11/2019 11:21:30 [name], [name] ([id number]) -------------------------------------------------------------------------------- Problem List Details Patient Name: [name], CABA. Date of Service: 09/11/2019 11:00 AM Medical Record Number: [id number] Patient Account Number: [id number] Date of Birth/Sex: 12/08/1940 (79 y.o. 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 with muscle 07/10/2019 No Yes involvement without evidence of necrosis L97.812 Non-pressure chronic ulcer of other part of right lower leg with fat layer 07/10/2019 No Yes exposed I48.0 Paroxysmal atrial fibrillation 07/10/2019 No Yes I10 Essential (primary) hypertension 07/10/2019 No Yes I25.10 Atherosclerotic heart disease of native coronary artery without angina 07/10/2019 No Yes pectoris I73.89 Other specified peripheral vascular diseases 07/10/2019 No Yes F17.218 Nicotine dependence, cigarettes, with other nicotine-induced disorders 07/10/2019 No Yes Inactive Problems Resolved Problems Electronic Signature(s) Signed: 09/11/2019 11:16:39 AM By: Worthy Keeler PA-C Entered By: Worthy Keeler on 09/11/2019 11:16:39 Strickling, [name] ([id number]) Ledesma, [name] ([id number]) -------------------------------------------------------------------------------- Progress Note Details Patient Name: [name]. Date of Service: 09/11/2019 11:00 AM Medical Record Number: [id number] Patient Account Number: [id number] Date of Birth/Sex: 1940-12-23 (79 y.o. F) Treating RN: Army Melia Primary Care Provider: Lemmie Evens Other Clinician: Referring Provider: Lemmie Evens Treating Provider/Extender: Melburn Hake, Zerrick Hanssen Weeks in Treatment: 9 Subjective Chief  Complaint Information obtained from Patient Bilateral LE ulcers History of Present Illness (HPI) 07/10/2019 on evaluation today patient presents for initial inspection here in our clinic concerning wounds that she sustained as a result of traumatic injuries to her left lower leg as well as her right lower leg. The right side is much less severe the left side  actually has muscle exposed. This left side she actually had in a car door and I think it did tear into her quite a bit more than the trauma wound which was more of an abrasion on the right lower extremity. Nonetheless she is having a lot of drainage from the left. This is the one obviously that is deeper down to the muscle area. With that being said the patient also has paroxysmal atrial fibrillation, hypertension, coronary artery disease, peripheral vascular disease, and nicotine dependence. Currently she is not having a lot of severe pain although there is some discomfort. No fevers, chills, nausea, vomiting, or diarrhea. Patient did have arterial studies performed on 05/23/2019 which revealed a left ABI of 1.08 and a right ABI of 0.95. 07/17/2019 on evaluation today patient appears to be doing quite well with regard to her wounds. She has been showing signs of actually dramatic improvement in just 1 week in my opinion. In fact even the area where the muscle is exposed is showing already good signs of new granulation tissue over top of the muscle which is exactly what we want to see. Overall I am very pleased at this point. 07/24/2019 upon evaluation today patient appears to be doing well with regard to her wounds. In fact the right lower extremity ulcer appears to be almost completely healed which is great news some of the dressing material stuck to the periwound region. In regard to the left lower extremity that is showing signs of improvement at slow but nonetheless is still making improvement at this point. 08/02/2019 upon evaluation today  patient appears to be doing well with regard to her wounds. The right leg ulcer is actually completely healed the left leg ulcer though not healed is doing significantly better. I am very pleased with how things seem to be progressing. Especially in light of how deep this wound was initially. 08/07/19 upon evaluation today patient actually appears to be showing signs of good improvement which is excellent news. There does not appear to be any evidence of active infection which is also excellent news. Overall I'm pleased with the healing prospects currently. I do believe that this is going to continue to take some time but nonetheless I do feel like she's also making excellent improvement especially considering the depth and extent of her wound initially. 08/14/2019 on evaluation today patient appears to be doing well with regard to her wound. She has been tolerating the dressing changes without complication other than the fact that she states it feels like it is kind of hurting her at the top of the wrap. This may be due to the indications. Fortunately there is no signs of active infection at this time which is good news. No fevers, chills, nausea, vomiting, or diarrhea. Unfortunately she is having some issues with her breathing she states that she called her primary care office and they told her that her primary care provider, Dr. Meda Coffee, would not be back until August. That seems somewhat extreme to me but she was told if she had any issues she needed to go to the ER according to the patient. I contacted their office today actually spoke with both apparently Dr. Vickey Sages wife as well as the scheduler and got the patient appointment for tomorrow at 2. I am not sure who told her the other information. 08/21/2019 upon evaluation today patient appears to be doing very well with regard to the wound on her lower extremity. She has been tolerating the dressing changes without  complication will be using a  compression wrap but overall she seems to be tolerating this quite well. I am very pleased with how things are progressing. The patient likewise is happy to see improvement. I do think she could potentially benefit from a snap VAC that something that we can check into although again I am not sure that her insurance will cover this or if they do how much her portion may be. We will definitely let her know once we get the results back from authorization. 09/04/2019 on evaluation today patient actually appears to be doing quite well with regard to her wound. She has been tolerating the dressing changes without complication. Fortunately there is no signs of active infection at this time. No fevers, chills, nausea, vomiting, or diarrhea.. With that being said I do believe that the patient at this point is going to likely heal fairly soon and I do not even see any need for sharp debridement today which is great news. 09/11/2019 upon evaluation today patient appears to actually be doing quite well with regard to her wound she has been tolerating the dressing changes without complication the wound is measuring better even compared to last week overall very pleased with the progress up to this point. [name], [name] ([id number]) Objective Constitutional Well-nourished and well-hydrated in no acute distress. Vitals Time Taken: 11:04 AM, Height: 67 in, Weight: 180 lbs, BMI: 28.2, Temperature: 97.9 F, Pulse: 78 bpm, Respiratory Rate: 16 breaths/min, Blood Pressure: 163/77 mmHg. Respiratory normal breathing without difficulty. Psychiatric this patient is able to make decisions and demonstrates good insight into disease process. Alert and Oriented x 3. pleasant and cooperative. General Notes: Patient's wound upon inspection today did have some slough in the surface of the wound I was able to gently mechanically debride this away no sharp debridement was necessary. There is no undermining and overall patient is  doing quite well with a lot of new skin growth. I think we are very close to getting this to completely close.. Integumentary (Hair, Skin) Wound #1 status is Open. Original cause of wound was Trauma. The wound is located on the Left,Lateral Lower Leg. The wound measures 0.5cm length x 0.4cm width x 0.2cm depth; 0.157cm^2 area and 0.031cm^3 volume. There is Fat Layer (Subcutaneous Tissue) Exposed exposed. There is a medium amount of serous drainage noted. The wound margin is flat and intact. There is medium (34-66%) red granulation within the wound bed. There is a medium (34-66%) amount of necrotic tissue within the wound bed including Adherent Slough. 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 There was a Three Layer Compression Therapy Procedure with a pre-treatment ABI of 0.9 by Montey Hora, RN. Post procedure Diagnosis Wound #1: Same as Pre-Procedure Plan Wound Cleansing: Wound #1 Left,Lateral Lower Leg: Cleanse wound with mild soap and water [name], [name] ([id number]) May shower with protection. - Please do not get your wraps wet Primary Wound Dressing: Silver Collagen Secondary Dressing: Wound #1 Left,Lateral Lower  Leg: ABD pad Dressing Change Frequency: Wound #1 Left,Lateral Lower Leg: Change dressing  every week Follow-up Appointments: Wound #1 Left,Lateral Lower Leg: Return Appointment in 1 week. Edema Control: Wound #1 Left,Lateral Lower Leg: 3 Layer Compression System - Left Lower Extremity 1. My suggestion currently is can be that we continue with the silver collagen dressing I still think that is the best way to go at this point. 2. I would also recommend that we continue with the compression wrap and we have been utilizing up to this point a 3 layer compression wrap the patient is tolerating this without complication. We will see patient back for reevaluation in 1 week here in the clinic. If anything worsens or changes patient will contact our office for additional recommendations. Electronic Signature(s) Signed: 09/11/2019 11:24:56 AM By: Worthy Keeler PA-C Entered By: Worthy Keeler on 09/11/2019 11:24:56 [name], [name] ([id number]) -------------------------------------------------------------------------------- SuperBill Details Patient Name: [name], [name] Date of Service: 09/11/2019 Medical Record Number: [id number] Patient Account Number: [id number] Date of Birth/Sex: [birth year] (79 y.o. 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 L97.812 Non-pressure chronic ulcer of other part of right lower leg with fat layer exposed I48.0 Paroxysmal atrial fibrillation I10 Essential (primary) hypertension I25.10 Atherosclerotic heart disease of native coronary artery without angina pectoris I73.89 Other specified peripheral vascular  diseases F17.218 Nicotine dependence, cigarettes, with other nicotine-induced disorders Facility Procedures CPT4 Code: [id number] Description: (Facility Use Only) 29581LT - [place] LWR LT LEG Modifier: Quantity: 1 Physician Procedures CPT4 Code Description: [id number] 53614 - WC PHYS LEVEL 3 - EST PT Modifier: Quantity: 1 CPT4 Code Description: ICD-10 Diagnosis Description S81.802A Unspecified open wound, left lower leg, initial encounter S81.801A Unspecified open wound, right lower leg, initial encounter L97.825 Non-pressure chronic ulcer of other part of left lower leg  with muscle involv L97.812 Non-pressure chronic ulcer of other part of right lower leg with fat layer ex Modifier: ement without evidenc posed Quantity: e of necrosis Electronic Signature(s) Signed: 09/11/2019 11:25:15 AM By: Worthy Keeler PA-C Entered By: Worthy Keeler on 09/11/2019 11:25:14

## 2019-09-12 NOTE — Progress Notes (Signed)
[name] ([id number]) Visit Report for 09/11/2019 Arrival Information Details Patient Name: [name], [name]. Date of Service: 09/11/2019 11:00 AM Medical Record Number: [id number] Patient Account Number: [id number] Date of Birth/Sex: 03/29/1941 (79 y.o. F) Treating RN: [name] Primary Care [name]: [name] Other Clinician: Referring [name]: [name] Treating Bethenny Losee/Extender: [name] Weeks in Treatment: 9 Visit Information History Since Last Visit Added or deleted any medications: No Patient Arrived: Ambulatory Any new allergies or adverse reactions: No Arrival Time: 11:04 Had a fall or experienced change in No Accompanied By: self activities of daily living that may affect Transfer Assistance: None risk of falls: Patient Identification Verified: Yes Signs or symptoms of abuse/neglect since last visito No Patient Has Alerts: Yes Hospitalized since last visit: No Patient Alerts: ABI 05/23/2019 Has Dressing in Place as Prescribed: Yes (L)1.08 (R) 0.95 Pain Present Now: No TBI (L) 0.92 (R) 0.87 Electronic Signature(s) Signed: 09/12/2019 8:09:31 AM By: [name] Entered By: [name] on 09/11/2019 11:04:50 [name] ([id number]) -------------------------------------------------------------------------------- Compression Therapy Details Patient Name: [name] Date of Service: 09/11/2019 11:00 AM Medical Record Number: [id number] Patient Account Number: [id number] Date of Birth/Sex: 10-12-1940 (79 y.o. F) Treating RN: [name] Primary Care Corran Lalone: [name] Other Clinician: Referring Koran Seabrook: [name] Treating [name]/Extender: [name] Weeks in Treatment: 9 Compression Therapy Performed for Wound Assessment: Wound #1 Left,Lateral Lower Leg Performed By: Clinician Kim Hora, RN Compression Type: Three Layer Pre Treatment ABI: 0.9 Post Procedure Diagnosis Same as Pre-procedure Electronic  Signature(s) Signed: 09/11/2019 4:36:26 PM By: [name] Entered By: [name] on 09/11/2019 11:21:13 [name], [name] ([id number]) -------------------------------------------------------------------------------- Encounter Discharge Information Details Patient Name: [name], [name]. Date of Service: 09/11/2019 11:00 AM Medical Record Number: [id number] Patient Account Number: [id number] Date of Birth/Sex: 10/07/1940 (79 y.o. F) Treating RN: [name] Primary Care Alessandra Sawdey: [name] Other Clinician: Referring Honora Searson: [name] Treating [name]/Extender: [name] Weeks in Treatment: 9 Encounter Discharge Information Items Discharge Condition: Stable Ambulatory Status: Ambulatory Discharge Destination: Home Transportation: Private Auto Accompanied By: self Schedule Follow-up Appointment: Yes Clinical Summary of Care: Electronic Signature(s) Signed: 09/11/2019 4:36:26 PM By: [name] Entered By: [name] on 09/11/2019 11:22:54 [name], [name] ([id number]) -------------------------------------------------------------------------------- Lower Extremity Assessment Details Patient Name: [name]. Date of Service: 09/11/2019 11:00 AM Medical Record Number: [id number] Patient Account Number: [id number] Date of Birth/Sex: [birth year] (79 y.o. F) Treating RN: [name] Primary Care Ionia Schey: [name] Other Clinician: Referring Aleera Gilcrease: [name] Treating Jeray Shugart/Extender: [name] Weeks in Treatment: 9 Edema Assessment Assessed: [Left: No] [Right: No] Edema: [Left: N] [Right: o] Calf Left: Right: Point of Measurement: 30 cm From Medial Instep 35 cm cm Ankle Left: Right: Point of Measurement: 10 cm From Medial Instep 20 cm cm Vascular Assessment Pulses: Dorsalis Pedis Palpable: [Left:Yes] Electronic Signature(s) Signed: 09/12/2019 8:09:31 AM By: [name] Entered By: [name] on 09/11/2019  11:15:39 [name], [name] ([id number]) -------------------------------------------------------------------------------- Multi Wound Chart Details Patient Name: [name]. Date of Service: 09/11/2019 11:00 AM Medical Record Number: [id number] Patient Account Number: [id number] Date of Birth/Sex: [birth year] (79 y.o. F) Treating RN: [name] Primary Care Shaylee Stanislawski: [name] Other Clinician: Referring Deloma Spindle: [name] Treating Cyrena Kuchenbecker/Extender: [name] Weeks in Treatment: 9 Vital Signs Height(in): 67 Pulse(bpm): 73 Weight(lbs): 180 Blood Pressure(mmHg): 163/77 Body Mass Index(BMI): 28 Temperature(F): 97.9 Respiratory Rate(breaths/min): 16 Photos: [N/A:N/A] Wound Location: Left Lower Leg - Lateral N/A N/A Wounding Event: Trauma N/A N/A Primary Etiology: Trauma,  Other N/A N/A Comorbid History: Cataracts, Asthma, Chronic N/A N/A Obstructive Pulmonary Disease (COPD), Arrhythmia, Coronary Artery Disease, Hypertension, Hypotension, Myocardial Infarction, Peripheral Arterial Disease Date Acquired: 06/18/2019 N/A N/A Weeks of Treatment: 9 N/A N/A Wound Status: Open N/A N/A Measurements L x W x D (cm) 0.8x0.5x0.3 N/A N/A Area (cm) : 0.314 N/A N/A Volume (cm) : 0.094 N/A N/A % Reduction in Area: 97.40% N/A N/A % Reduction in Volume: 98.50% N/A N/A Classification: Full Thickness Without Exposed N/A N/A Support Structures Exudate Amount: Medium N/A N/A Exudate Type: Serous N/A N/A Exudate Color: amber N/A N/A Wound Margin: Flat and Intact N/A N/A Granulation Amount: Medium (34-66%) N/A N/A Granulation Quality: Red N/A N/A Necrotic Amount: Medium (34-66%) N/A N/A Exposed Structures: Fat Layer (Subcutaneous Tissue) N/A N/A Exposed: Yes Fascia: No Tendon: No Muscle: No Joint: No Bone: No Epithelialization: None N/A N/A Treatment Notes [name], [name] ([id number]) Electronic Signature(s) Signed: 09/11/2019 4:36:26 PM By: [name] Entered  By: [name] on 09/11/2019 11:18:07 [name] ([id number]) -------------------------------------------------------------------------------- Multi-Disciplinary Care Plan Details Patient Name: [name], [name]. Date of Service: 09/11/2019 11:00 AM Medical Record Number: [id number] Patient Account Number: [id number] Date of Birth/Sex: [birth year] (79 y.o. F) Treating RN: [name] Primary Care Wendle Kina: [name] Other Clinician: Referring Kal Chait: [name] Treating Arnisha Laffoon/Extender: [name] Weeks in Treatment: 9 Active Inactive Abuse / Safety / Falls / Self Care Management Nursing Diagnoses: Potential for falls Goals: Patient will not experience any injury related to falls Date Initiated: 07/10/2019 Target Resolution Date: 10/13/2019 Goal Status: Active Interventions: Assess fall risk on admission and as needed Notes: Necrotic Tissue Nursing Diagnoses: Impaired tissue integrity related to necrotic/devitalized tissue Goals: Necrotic/devitalized tissue will be minimized in the wound bed Date Initiated: 07/10/2019 Target Resolution Date: 10/13/2019 Goal Status: Active Interventions: Provide education on necrotic tissue and debridement process Notes: Orientation to the Wound Care Program Nursing Diagnoses: Knowledge deficit related to the wound healing center program Goals: Patient/caregiver will verbalize understanding of the [place] Date Initiated: 07/10/2019 Target Resolution Date: 10/13/2019 Goal Status: Active Interventions: Provide education on orientation to the wound center Notes: Wound/Skin Impairment Nursing Diagnoses: Impaired tissue integrity AZALIYAH, KENNARD ([id number]) Goals: Ulcer/skin breakdown will heal within 14 weeks Date Initiated: 07/10/2019 Target Resolution Date: 10/13/2019 Goal Status: Active Interventions: Assess patient/caregiver ability to obtain necessary supplies Assess patient/caregiver  ability to perform ulcer/skin care regimen upon admission and as needed Assess ulceration(s) every visit Notes: Electronic Signature(s) Signed: 09/11/2019 4:36:26 PM By: [name] Entered By: [name] on 09/11/2019 11:18:00 [name], [name] ([id number]) -------------------------------------------------------------------------------- Pain Assessment Details Patient Name: [name]. Date of Service: 09/11/2019 11:00 AM Medical Record Number: [id number] Patient Account Number: [id number] Date of Birth/Sex: 1941-03-21 (79 y.o. F) Treating RN: [name] Primary Care Tysheena Ginzburg: [name] Other Clinician: Referring Sterling Mondo: [name] Treating Farmer Mccahill/Extender: [name] Weeks in Treatment: 9 Active Problems Location of Pain Severity and Description of Pain Patient Has Paino No Site Locations Pain Management and Medication Current Pain Management: Electronic Signature(s) Signed: 09/12/2019 8:09:31 AM By: [name] Entered By: [name] on 09/11/2019 11:11:43 [name], [name] ([id number]) -------------------------------------------------------------------------------- Patient/Caregiver Education Details Patient Name: [name]. Date of Service: 09/11/2019 11:00 AM Medical Record Number: [id number] Patient Account Number: [id number] Date of Birth/Gender: 02/03/1941 (79 y.o. F) Treating RN: [name] Primary Care Physician: [name] Other Clinician: Referring Physician: Lemmie [name] Treating Physician/Extender: Sharalyn Ink in Treatment: 9 Education Assessment Education Provided To: Patient Education  Topics Provided Venous: Handouts: Other: leg elevation Methods: Explain/Verbal Responses: State content correctly Electronic Signature(s) Signed: 09/11/2019 4:36:26 PM By: [name] Entered By: [name] on 09/11/2019 11:22:04 [name], [name]  ([id number]) -------------------------------------------------------------------------------- Wound Assessment Details Patient Name: Kim Anger C. Date of Service: 09/11/2019 11:00 AM Medical Record Number: [id number] Patient Account Number: [id number] Date of Birth/Sex: 1940-09-07 (79 y.o. F) Treating RN: [name] Primary Care Celsey Asselin: [name] Other Clinician: Referring Analisa Sledd: [name] Treating Imberly Troxler/Extender: [name] Weeks in Treatment: 9 Wound Status Wound Number: 1 Primary Trauma, Other Etiology: Wound Location: Left, Lateral Lower Leg Wound Open Wounding Event: Trauma Status: Date Acquired: 06/18/2019 Comorbid Cataracts, Asthma, Chronic Obstructive Pulmonary Disease Weeks Of Treatment: 9 History: (COPD), Arrhythmia, Coronary Artery Disease, Hypertension, Clustered Wound: No Hypotension, Myocardial Infarction, Peripheral Arterial Disease Photos Wound Measurements Length: (cm) 0.5 Width: (cm) 0.4 Depth: (cm) 0.2 Area: (cm) 0.157 Volume: (cm) 0.031 % Reduction in Area: 98.7% % Reduction in Volume: 99.5% Epithelialization: None Wound Description Classification: Full Thickness Without Exposed Support Structu Wound Margin: Flat and Intact Exudate Amount: Medium Exudate Type: Serous Exudate Color: amber res Foul Odor After Cleansing: No Slough/Fibrino Yes Wound Bed Granulation Amount: Medium (34-66%) Exposed Structure Granulation Quality: Red Fascia Exposed: No Necrotic Amount: Medium (34-66%) Fat Layer (Subcutaneous Tissue) Exposed: Yes Necrotic Quality: Adherent Slough Tendon Exposed: No Muscle Exposed: No Joint Exposed: No Bone Exposed: No Treatment Notes Wound #1 (Left, Lateral Lower Leg) Notes prisma, ABD, 3 layer [name], [name] ([id number]) Electronic Signature(s) Signed: 09/11/2019 4:36:26 PM By: [name] Entered By: [name] on 09/11/2019 11:20:38 [place], [name]  ([id number]) -------------------------------------------------------------------------------- Vitals Details Patient Name: [name] Date of Service: 09/11/2019 11:00 AM Medical Record Number: [id number] Patient Account Number: [id number] Date of Birth/Sex: 08-Sep-1940 (79 y.o. F) Treating RN: [name] Primary Care Royale Swamy: [name] Other Clinician: Referring Nallely Yost: [name] Treating Marcelles Clinard/Extender: [name] Weeks in Treatment: 9 Vital Signs Time Taken: 11:04 Temperature (F): 97.9 Height (in): 67 Pulse (bpm): 78 Weight (lbs): 180 Respiratory Rate (breaths/min): 16 Body Mass Index (BMI): 28.2 Blood Pressure (mmHg): 163/77 Reference Range: 80 - 120 mg / dl Electronic Signature(s) Signed: 09/12/2019 8:09:31 AM By: [name] Entered By: [name] on 09/11/2019 11:11:31

## 2019-09-18 ENCOUNTER — Ambulatory Visit (INDEPENDENT_AMBULATORY_CARE_PROVIDER_SITE_OTHER): Payer: Medicare Other | Admitting: Internal Medicine

## 2019-09-18 ENCOUNTER — Encounter: Payer: Medicare HMO | Admitting: Physician Assistant

## 2019-09-18 ENCOUNTER — Other Ambulatory Visit: Payer: Self-pay

## 2019-09-18 DIAGNOSIS — L97812 Non-pressure chronic ulcer of other part of right lower leg with fat layer exposed: Secondary | ICD-10-CM | POA: Diagnosis not present

## 2019-09-18 NOTE — Progress Notes (Addendum)
[name], [name] ([id number]) Visit Report for 09/18/2019 Chief Complaint Document Details Patient Name: Kim, [name]. Date of Service: 09/18/2019 12:45 PM Medical Record Number: [id number] Patient Account Number: [id number] Date of Birth/Sex: 1940/11/12 (79 y.o. F) Treating RN: Montey Hora Primary Care Provider: Lemmie Evens Other Clinician: Referring Provider: Lemmie Evens Treating Provider/Extender: Melburn Hake, Caisen Mangas Weeks in Treatment: 10 Information Obtained from: Patient Chief Complaint Bilateral LE ulcers Electronic Signature(s) Signed: 09/18/2019 1:01:32 PM By: Worthy Keeler PA-C Entered By: Worthy Keeler on 09/18/2019 13:01:31 [name], [name] ([id number]) -------------------------------------------------------------------------------- HPI Details Patient Name: [name] Date of Service: 09/18/2019 12:45 PM Medical Record Number: [id number] Patient Account Number: [id number] Date of Birth/Sex: December 04, 1940 (79 y.o. F) Treating RN: Montey Hora Primary Care Provider: Lemmie Evens Other Clinician: Referring Provider: Lemmie Evens Treating Provider/Extender: Melburn Hake, Masyn Rostro Weeks in Treatment: 10 History of Present Illness HPI Description: 07/10/2019 on evaluation today patient presents for initial inspection here in our clinic concerning wounds that she sustained as a result of traumatic injuries to her left lower leg as well as her right lower leg. The right side is much less severe the left side actually has muscle exposed. This left side she actually had in a car door and I think it did tear into her quite a bit more than the trauma wound which was more of an abrasion on the right lower extremity. Nonetheless she is having a lot of drainage from the left. This is the one obviously that is deeper down to the muscle area. With that being said the patient also has paroxysmal atrial fibrillation, hypertension, coronary artery disease, peripheral vascular  disease, and nicotine dependence. Currently she is not having a lot of severe pain although there is some discomfort. No fevers, chills, nausea, vomiting, or diarrhea. Patient did have arterial studies performed on 05/23/2019 which revealed a left ABI of 1.08 and a right ABI of 0.95. 07/17/2019 on evaluation today patient appears to be doing quite well with regard to her wounds. She has been showing signs of actually dramatic improvement in just 1 week in my opinion. In fact even the area where the muscle is exposed is showing already good signs of new granulation tissue over top of the muscle which is exactly what we want to see. Overall I am very pleased at this point. 07/24/2019 upon evaluation today patient appears to be doing well with regard to her wounds. In fact the right lower extremity ulcer appears to be almost completely healed which is great news some of the dressing material stuck to the periwound region. In regard to the left lower extremity that is showing signs of improvement at slow but nonetheless is still making improvement at this point. 08/02/2019 upon evaluation today patient appears to be doing well with regard to her wounds. The right leg ulcer is actually completely healed the left leg ulcer though not healed is doing significantly better. I am very pleased with how things seem to be progressing. Especially in light of how deep this wound was initially. 08/07/19 upon evaluation today patient actually appears to be showing signs of good improvement which is excellent news. There does not appear to be any evidence of active infection which is also excellent news. Overall I'm pleased with the healing prospects currently. I do believe that this is going to continue to take some time but nonetheless I do feel like she's also making excellent improvement especially considering the depth and extent of her wound initially. 08/14/2019 on  evaluation today patient appears to be doing well  with regard to her wound. She has been tolerating the dressing changes without complication other than the fact that she states it feels like it is kind of hurting her at the top of the wrap. This may be due to the indications. Fortunately there is no signs of active infection at this time which is good news. No fevers, chills, nausea, vomiting, or diarrhea. Unfortunately she is having some issues with her breathing she states that she called her primary care office and they told her that her primary care provider, Dr. Meda Coffee, would not be back until August. That seems somewhat extreme to me but she was told if she had any issues she needed to go to the ER according to the patient. I contacted their office today actually spoke with both apparently Dr. Vickey Sages wife as well as the scheduler and got the patient appointment for tomorrow at 2. I am not sure who told her the other information. 08/21/2019 upon evaluation today patient appears to be doing very well with regard to the wound on her lower extremity. She has been tolerating the dressing changes without complication will be using a compression wrap but overall she seems to be tolerating this quite well. I am very pleased with how things are progressing. The patient likewise is happy to see improvement. I do think she could potentially benefit from a snap VAC that something that we can check into although again I am not sure that her insurance will cover this or if they do how much her portion may be. We will definitely let her know once we get the results back from authorization. 09/04/2019 on evaluation today patient actually appears to be doing quite well with regard to her wound. She has been tolerating the dressing changes without complication. Fortunately there is no signs of active infection at this time. No fevers, chills, nausea, vomiting, or diarrhea.. With that being said I do believe that the patient at this point is going to likely heal  fairly soon and I do not even see any need for sharp debridement today which is great news. 09/11/2019 upon evaluation today patient appears to actually be doing quite well with regard to her wound she has been tolerating the dressing changes without complication the wound is measuring better even compared to last week overall very pleased with the progress up to this point. 09/18/2019 upon evaluation today patient appears to be doing very well with regard to her wound on the leg currently. Fortunately there is no signs of active infection at this time. No fevers, chills, nausea, vomiting, or diarrhea. In general I am actually very pleased with how she seems to be progressing at this time. Electronic Signature(s) Signed: 09/18/2019 1:08:31 PM By: Worthy Keeler PA-C Entered By: Worthy Keeler on 09/18/2019 13:08:31 [name], [name] ([id number]) -------------------------------------------------------------------------------- Physical Exam Details Patient Name: [name], [name]. Date of Service: 09/18/2019 12:45 PM Medical Record Number: [id number] Patient Account Number: [id number] Date of Birth/Sex: 12-25-1940 (79 y.o. F) Treating RN: Montey Hora Primary Care Provider: Lemmie Evens Other Clinician: Referring Provider: Lemmie Evens Treating Provider/Extender: STONE III, Ellory Khurana Weeks in Treatment: 28 Constitutional Well-nourished and well-hydrated in no acute distress. Respiratory normal breathing without difficulty. Psychiatric this patient is able to make decisions and demonstrates good insight into disease process. Alert and Oriented x 3. pleasant and cooperative. Notes Patient's wound bed currently showed signs of good granulation and epithelization currently. No sharp debridement was  even necessary and with what I am seeing at this point she looks like the wound is all but healed with new skin pretty much covering the entirety of the central portion of the wound. With that being  said I do believe that using something like Adaptic and then continuing with the wrap would be appropriate at this point. Electronic Signature(s) Signed: 09/18/2019 1:08:57 PM By: Worthy Keeler PA-C Entered By: Worthy Keeler on 09/18/2019 13:08:57 [name], [name] ([id number]) -------------------------------------------------------------------------------- Physician Orders Details Patient Name: [name], [name] Date of Service: 09/18/2019 12:45 PM Medical Record Number: [id number] Patient Account Number: [id number] Date of Birth/Sex: June 06, 1941 (79 y.o. 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 - Please do not get your wraps wet Primary Wound Dressing o Other: - Adaptic Secondary Dressing Wound #1 Left,Lateral Lower Leg o ABD pad Dressing Change Frequency Wound #1 Left,Lateral Lower Leg o Change dressing every week Follow-up Appointments Wound #1 Left,Lateral Lower Leg o Return Appointment in 1  week. Edema Control Wound #1 Left,Lateral Lower Leg o 3 Layer Compression System - Left Lower Extremity Electronic Signature(s) Signed: 09/18/2019 2:58:07 PM By: Montey Hora Signed: 09/18/2019 3:07:58 PM By: Worthy Keeler PA-C Entered By: Montey Hora on 09/18/2019 13:06:18 [name], [name] ([id number]) -------------------------------------------------------------------------------- Problem List Details Patient Name: [name], [name]. Date of Service: 09/18/2019 12:45 PM Medical Record Number: [id number] Patient Account Number: [id number] Date of Birth/Sex: January 08, 1941 (79 y.o. 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 muscle 07/10/2019 No Yes involvement without evidence of necrosis L97.812 Non-pressure chronic ulcer of other part of right lower leg with fat layer 07/10/2019 No Yes exposed I48.0 Paroxysmal atrial fibrillation 07/10/2019 No Yes I10 Essential (primary) hypertension 07/10/2019 No Yes I25.10 Atherosclerotic heart disease of native coronary artery without angina 07/10/2019 No Yes pectoris I73.89 Other specified peripheral vascular diseases 07/10/2019 No Yes F17.218 Nicotine dependence, cigarettes, with other nicotine-induced disorders 07/10/2019 No Yes Inactive Problems Resolved Problems Electronic Signature(s) Signed: 09/18/2019 1:01:25 PM By: Worthy Keeler PA-C Entered By: Worthy Keeler on 09/18/2019 13:01:24 [name], [name] ([id number]) [name], [name]  ([id number]) -------------------------------------------------------------------------------- Progress Note Details Patient Name: [name] Date of Service: 09/18/2019 12:45 PM Medical Record Number: [id number] Patient Account Number: [id number] Date of Birth/Sex: 08/02/1940 (79 y.o. F) Treating RN: Montey Hora Primary Care Provider: Lemmie Evens Other Clinician: Referring Provider:  Lemmie Evens Treating Provider/Extender: STONE III, Greidys Deland Weeks in Treatment: 10 Subjective Chief Complaint Information obtained from Patient Bilateral LE ulcers History of Present Illness (HPI) 07/10/2019 on evaluation today patient presents for initial inspection here in our clinic concerning wounds that she sustained as a result of traumatic injuries to her left lower leg as well as her right lower leg. The right side is much less severe the left side actually has muscle exposed. This left side she actually had in a car door and I think it did tear into her quite a bit more than the trauma wound which was more of an abrasion on the right lower extremity. Nonetheless she is having a lot of drainage from the left. This is the one obviously that is deeper down to the muscle area. With that being said the patient also has paroxysmal atrial fibrillation, hypertension, coronary artery disease, peripheral vascular disease, and nicotine dependence. Currently she is not having a lot of severe pain although there is some discomfort. No fevers, chills, nausea, vomiting, or diarrhea. Patient did have arterial studies performed on 05/23/2019 which revealed a left ABI of 1.08 and a right ABI of 0.95. 07/17/2019 on evaluation today patient appears to be doing quite well with regard to her wounds. She has been showing signs of actually dramatic improvement in just 1 week in my opinion. In fact even the area where the muscle is exposed is showing already good signs of new granulation tissue over top of the muscle  which is exactly what we want to see. Overall I am very pleased at this point. 07/24/2019 upon evaluation today patient appears to be doing well with regard to her wounds. In fact the right lower extremity ulcer appears to be almost completely healed which is great news some of the dressing material stuck to the periwound region. In regard to the left lower extremity that is showing signs of improvement at slow but nonetheless is still making improvement at this point. 08/02/2019 upon evaluation today patient appears to be doing well with regard to her wounds. The right leg ulcer is actually completely healed the left leg ulcer though not healed is doing significantly better. I am very pleased with how things seem to be progressing. Especially in light of how deep this wound was initially. 08/07/19 upon evaluation today patient actually appears to be showing signs of good improvement which is excellent news. There does not appear to be any evidence of active infection which is also excellent news. Overall I'm pleased with the healing prospects currently. I do believe that this is going to continue to take some time but nonetheless I do feel like she's also making excellent improvement especially considering the depth and extent of her wound initially. 08/14/2019 on evaluation today patient appears to be doing well with regard to her wound. She has been tolerating the dressing changes without complication other than the fact that she states it feels like it is kind of hurting her at the top of the wrap. This may be due to the indications. Fortunately there is no signs of active infection at this time which is good news. No fevers, chills, nausea, vomiting, or diarrhea. Unfortunately she is having some issues with her breathing she states that she called her primary care office and they told her that her primary care provider, Dr. Meda Coffee, would not be back until August. That seems somewhat extreme to me but  she was told if she had any issues she needed to  go to the ER according to the patient. I contacted their office today actually spoke with both apparently Dr. Vickey Sages wife as well as the scheduler and got the patient appointment for tomorrow at 2. I am not sure who told her the other information. 08/21/2019 upon evaluation today patient appears to be doing very well with regard to the wound on her lower extremity. She has been tolerating the dressing changes without complication will be using a compression wrap but overall she seems to be tolerating this quite well. I am very pleased with how things are progressing. The patient likewise is happy to see improvement. I do think she could potentially benefit from a snap VAC that something that we can check into although again I am not sure that her insurance will cover this or if they do how much her portion may be. We will definitely let her know once we get the results back from authorization. 09/04/2019 on evaluation today patient actually appears to be doing quite well with regard to her wound. She has been tolerating the dressing changes without complication. Fortunately there is no signs of active infection at this time. No fevers, chills, nausea, vomiting, or diarrhea.. With that being said I do believe that the patient at this point is going to likely heal fairly soon and I do not even see any need for sharp debridement today which is great news. 09/11/2019 upon evaluation today patient appears to actually be doing quite well with regard to her wound she has been tolerating the dressing changes without complication the wound is measuring better even compared to last week overall very pleased with the progress up to this point. 09/18/2019 upon evaluation today patient appears to be doing very well with regard to her wound on the leg currently. Fortunately there is no signs of active infection at this time. No fevers, chills, nausea, vomiting, or  diarrhea. In general I am actually very pleased with how she seems to be progressing at this time. [name], [name] ([id number]) Objective Constitutional Well-nourished and well-hydrated in no acute distress. Vitals Time Taken: 12:40 PM, Height: 67 in, Weight: 180 lbs, BMI: 28.2, Temperature: 98.0 F, Pulse: 61 bpm, Respiratory Rate: 16 breaths/min, Blood Pressure: 148/56 mmHg. Respiratory normal breathing without difficulty. Psychiatric this patient is able to make decisions and demonstrates good insight into disease process. Alert and Oriented x 3. pleasant and cooperative. General Notes: Patient's wound bed currently showed signs of good granulation and epithelization currently. No sharp debridement was even necessary and with what I am seeing at this point she looks like the wound is all but healed with new skin pretty much covering the entirety of the central portion of the wound. With that being said I do believe that using something like Adaptic and then continuing with the wrap would be appropriate at this point. Integumentary (Hair, Skin) Wound #1 status is Open. Original cause of wound was Trauma. The wound is located on the Left,Lateral Lower Leg. The wound measures 0.5cm length x 0.5cm width x 0.1cm depth; 0.196cm^2 area and 0.02cm^3 volume. There is a none present amount of drainage noted. The wound margin is flat and intact. There is no granulation within the wound bed. There is a medium (34-66%) amount of necrotic tissue within the wound bed including Adherent Slough. 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 There was a Three Layer Compression Therapy Procedure with a pre-treatment ABI of 1.1 by Montey Hora, RN. Post procedure Diagnosis Wound #1: Same as Pre-Procedure Plan [name], [name] ([id number]) Wound Cleansing: Wound #1 Left,Lateral Lower Leg: Cleanse wound with mild soap and water May shower with protection. - Please do not get your wraps wet Primary Wound Dressing: Other: - Adaptic Secondary Dressing: Wound #1 Left,Lateral Lower Leg: ABD pad Dressing Change Frequency: Wound #1 Left,Lateral Lower Leg: Change dressing every week Follow-up Appointments: Wound #1 Left,Lateral Lower Leg: Return Appointment in 1 week. Edema Control: Wound #1 Left,Lateral Lower Leg: 3 Layer Compression System - Left Lower Extremity 1. My suggestion at this time is can be that we go ahead and continue with the current wound care measures specifically with regard to the compression wrap and then subsequently we will also just use Adaptic in order to protect the wound area and allow it to heal well keep anything from sticking to the wound bed while it continues to epithelialize. 2. I do recommend she continue to elevate her legs is much as possible. 3. I am also can recommend that if the wrap starts to slide down or bunched up around her ankle that she contact the office and let us know in the meantime. We will see patient back for reevaluation in 1 week here in the clinic. If anything worsens or changes patient will contact our office for additional recommendations. Electronic Signature(s) Signed: 09/18/2019 1:09:38 PM By: Worthy Keeler PA-C Entered By: Worthy Keeler on  09/18/2019 13:09:38 Spengler, [name] ([id number]) -------------------------------------------------------------------------------- SuperBill Details Patient Name: [name] Date of Service: 09/18/2019 Medical Record Number: [id number] Patient Account Number: [id number] Date of Birth/Sex: 10/26/1940 (79 y.o. 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 L97.812 Non-pressure chronic ulcer of other part of right lower leg with fat layer exposed I48.0 Paroxysmal atrial fibrillation I10 Essential (primary) hypertension I25.10 Atherosclerotic heart disease of native coronary artery without angina pectoris I73.89 Other specified peripheral vascular diseases F17.218 Nicotine dependence, cigarettes, with other nicotine-induced disorders Facility Procedures CPT4 Code: [id number] Description: (Facility Use Only) 29581LT - [facility] LWR LT LEG Modifier: Quantity: 1 Physician Procedures CPT4 Code Description: [id number] - WC PHYS LEVEL 3 - EST PT Modifier: Quantity: 1 CPT4 Code Description: ICD-10 Diagnosis Description S81.802A Unspecified open wound, left lower leg, initial encounter S81.801A Unspecified open wound, right lower leg, initial encounter L97.825 Non-pressure chronic ulcer of other part of left lower leg  with muscle involv I48.0 Paroxysmal atrial fibrillation Modifier: ement without evidenc Quantity: e of necrosis Electronic Signature(s) Signed: 09/18/2019 1:09:58 PM By: Worthy Keeler PA-C Entered By: Worthy Keeler on  09/18/2019 13:09:57

## 2019-09-18 NOTE — Progress Notes (Signed)
LANDI, BISCARDI ([id number]) Visit Report for 09/18/2019 Arrival Information Details Patient Name: [name], [name]. Date of Service: 09/18/2019 12:45 PM Medical Record Number: [id number] Patient Account Number: [id number] Date of Birth/Sex: 11/18/1940 (79 y.o. F) Treating RN: Montey Hora Primary Care Lizeth Bencosme: Lemmie Evens Other Clinician: Referring Arlette Schaad: Lemmie Evens Treating Tulani Kidney/Extender: Melburn Hake, HOYT Weeks in Treatment: 10 Visit Information History Since Last Visit Added or deleted any medications: No Patient Arrived: Walker Any new allergies or adverse reactions: No Arrival Time: 12:45 Had a fall or experienced change in No Accompanied By: self activities of daily living that may affect Transfer Assistance: None risk of falls: Patient Identification Verified: Yes Signs or symptoms of abuse/neglect since last visito No Secondary Verification Process Completed: Yes Hospitalized since last visit: No Patient Has Alerts: Yes Implantable device outside of the clinic excluding No Patient Alerts: ABI 05/23/2019 cellular tissue based products placed in the center (L)1.08 (R) 0.95 since last visit: TBI (L) 0.92 (R) 0.87 Has Dressing in Place as Prescribed: Yes Has Compression in Place as Prescribed: Yes Pain Present Now: Yes Electronic Signature(s) Signed: 09/18/2019 3:02:39 PM By: Lorine Bears RCP, RRT, CHT Entered By: Lorine Bears on 09/18/2019 12:45:47 [name], [name] ([id number]) -------------------------------------------------------------------------------- Compression Therapy Details Patient Name: [name] Date of Service: 09/18/2019 12:45 PM Medical Record Number: [id number] Patient Account Number: [id number] Date of Birth/Sex: 1940/07/26 (79 y.o. F) Treating RN: Montey Hora Primary Care Mariajose Mow: Lemmie Evens Other Clinician: Referring Edis Huish: Lemmie Evens Treating Eulas Schweitzer/Extender: STONE III,  HOYT Weeks in Treatment: 10 Compression Therapy Performed for Wound Assessment: Wound #1 Left,Lateral Lower Leg Performed By: Clinician Montey Hora, RN Compression Type: Three Layer Pre Treatment ABI: 1.1 Post Procedure Diagnosis Same as Pre-procedure Electronic Signature(s) Signed: 09/18/2019 2:58:07 PM By: Montey Hora Entered By: Montey Hora on 09/18/2019 13:06:50 [name], [name] ([id number]) -------------------------------------------------------------------------------- Encounter Discharge Information Details Patient Name: [name], [name]. Date of Service: 09/18/2019 12:45 PM Medical Record Number: [id number] Patient Account Number: [id number] Date of Birth/Sex: [birth year] (79 y.o. F) Treating RN: Montey Hora Primary Care Kery Batzel: Lemmie Evens Other Clinician: Referring Litzi Binning: Lemmie Evens Treating Megin Consalvo/Extender: Melburn Hake, HOYT Weeks in Treatment: 10 Encounter Discharge Information Items Discharge Condition: Stable Ambulatory Status: Walker Discharge Destination: Home Transportation: Private Auto Accompanied By: self Schedule Follow-up Appointment: Yes Clinical Summary of Care: Electronic Signature(s) Signed: 09/18/2019 2:58:07 PM By: Montey Hora Entered By: Montey Hora on 09/18/2019 13:07:47 [name], [name] ([id number]) -------------------------------------------------------------------------------- Lower Extremity Assessment Details Patient Name: [name], [name]. Date of Service: 09/18/2019 12:45 PM Medical Record Number: [id number] Patient Account Number: [id number] Date of Birth/Sex: August 05, 1940 (79 y.o. F) Treating RN: Army Melia Primary Care Gaspare Netzel: Lemmie Evens Other Clinician: Referring Judah Carchi: Lemmie Evens Treating Jacqulene Huntley/Extender: STONE III, HOYT Weeks in Treatment: 10 Edema Assessment Assessed: [Left: No] [Right: No] Edema: [Left: Ye] [Right: s] Calf Left: Right: Point of Measurement: 30 cm From Medial Instep 35  cm cm Ankle Left: Right: Point of Measurement: 10 cm From Medial Instep 20 cm cm Vascular Assessment Pulses: Dorsalis Pedis Palpable: [Left:Yes] Electronic Signature(s) Signed: 09/18/2019 2:56:13 PM By: Army Melia Entered By: Army Melia on 09/18/2019 12:53:00 [name], [name] ([id number]) -------------------------------------------------------------------------------- Multi Wound Chart Details Patient Name: [name]. Date of Service: 09/18/2019 12:45 PM Medical Record Number: [id number] Patient Account Number: [id number] Date of Birth/Sex: 1941/05/30 (79 y.o. F) Treating RN: Montey Hora Primary Care Auri Jahnke: Lemmie Evens Other Clinician: Referring Lovely Kerins: Lemmie Evens Treating Davidlee Jeanbaptiste/Extender: STONE III, HOYT Weeks in Treatment: 10 Vital Signs Height(in):  67 Pulse(bpm): 61 Weight(lbs): 180 Blood Pressure(mmHg): 148/56 Body Mass Index(BMI): 28 Temperature(F): 98.0 Respiratory Rate(breaths/min): 16 Photos: [N/A:N/A] Wound Location: Left Lower Leg - Lateral N/A N/A Wounding Event: Trauma N/A N/A Primary Etiology: Trauma, Other N/A N/A Comorbid History: Cataracts, Asthma, Chronic N/A N/A Obstructive Pulmonary Disease (COPD), Arrhythmia, Coronary Artery Disease, Hypertension, Hypotension, Myocardial Infarction, Peripheral Arterial Disease Date Acquired: 06/18/2019 N/A N/A Weeks of Treatment: 10 N/A N/A Wound Status: Open N/A N/A Measurements L x W x D (cm) 0.5x0.5x0.1 N/A N/A Area (cm) : 0.196 N/A N/A Volume (cm) : 0.02 N/A N/A % Reduction in Area: 98.40% N/A N/A % Reduction in Volume: 99.70% N/A N/A Classification: Full Thickness Without Exposed N/A N/A Support Structures Exudate Amount: None Present N/A N/A Wound Margin: Flat and Intact N/A N/A Granulation Amount: None Present (0%) N/A N/A Necrotic Amount: Medium (34-66%) N/A N/A Exposed Structures: Fascia: No N/A N/A Fat Layer (Subcutaneous Tissue) Exposed: No Tendon: No Muscle:  No Joint: No Bone: No Epithelialization: None N/A N/A Treatment Notes Electronic Signature(s) Signed: 09/18/2019 2:58:07 PM By: Ulyess Blossom, [name] ([id number]) Entered By: Montey Hora on 09/18/2019 13:03:18 [name] ([id number]) -------------------------------------------------------------------------------- [place] Details Patient Name: [name], [name]. Date of Service: 09/18/2019 12:45 PM Medical Record Number: [id number] Patient Account Number: [id number] Date of Birth/Sex: 21-Jan-1941 (79 y.o. F) Treating RN: Montey Hora Primary Care Lerin Jech: Lemmie Evens Other Clinician: Referring Khalaya Mcgurn: Lemmie Evens Treating Corrin Hingle/Extender: Melburn Hake, HOYT Weeks in Treatment: 10 Active Inactive Abuse / Safety / Falls / Self Care Management Nursing Diagnoses: Potential for falls Goals: Patient will not experience any injury related to falls Date Initiated: 07/10/2019 Target Resolution Date: 10/13/2019 Goal Status: Active Interventions: Assess fall risk on admission and as needed Notes: Necrotic Tissue Nursing Diagnoses: Impaired tissue integrity related to necrotic/devitalized tissue Goals: Necrotic/devitalized tissue will be minimized in the wound bed Date Initiated: 07/10/2019 Target Resolution Date: 10/13/2019 Goal Status: Active Interventions: Provide education on necrotic tissue and debridement process Notes: Orientation to the Wound Care Program Nursing Diagnoses: Knowledge deficit related to the wound healing center program Goals: Patient/caregiver will verbalize understanding of the [facility] Date Initiated: 07/10/2019 Target Resolution Date: 10/13/2019 Goal Status: Active Interventions: Provide education on orientation to the wound center Notes: Wound/Skin Impairment Nursing Diagnoses: Impaired tissue integrity [name], [name] ([id number]) Goals: Ulcer/skin breakdown will heal within 14  weeks Date Initiated: 07/10/2019 Target Resolution Date: 10/13/2019 Goal Status: Active Interventions: Assess patient/caregiver ability to obtain necessary supplies Assess patient/caregiver ability to perform ulcer/skin care regimen upon admission and as needed Assess ulceration(s) every visit Notes: Electronic Signature(s) Signed: 09/18/2019 2:58:07 PM By: Montey Hora Entered By: Montey Hora on 09/18/2019 13:03:10 [name], [name] ([id number]) -------------------------------------------------------------------------------- Pain Assessment Details Patient Name: [name]. Date of Service: 09/18/2019 12:45 PM Medical Record Number: [id number] Patient Account Number: [id number] Date of Birth/Sex: 06/02/1941 (79 y.o. F) Treating RN: Army Melia Primary Care Dyanna Seiter: Lemmie Evens Other Clinician: Referring Bryanda Mikel: Lemmie Evens Treating Gabrielle Mester/Extender: Melburn Hake, HOYT Weeks in Treatment: 10 Active Problems Location of Pain Severity and Description of Pain Patient Has Paino No Site Locations Pain Management and Medication Current Pain Management: Electronic Signature(s) Signed: 09/18/2019 2:56:13 PM By: Army Melia Entered By: Army Melia on 09/18/2019 12:52:09 [name] ([id number]) -------------------------------------------------------------------------------- Patient/Caregiver Education Details Patient Name: [name]. Date of Service: 09/18/2019 12:45 PM Medical Record Number: [id number] Patient Account Number: [id number] Date of Birth/Gender: 1941/02/25 (79 y.o. F) Treating RN: Montey Hora Primary Care Physician: Karie Kirks  STEVE Other Clinician: Referring Physician: Lemmie Evens Treating Physician/Extender: Sharalyn Ink in Treatment: 10 Education Assessment Education Provided To: Patient Education Topics Provided Venous: Handouts: Other: need for ongoing compression Methods: Explain/Verbal Responses: State content  correctly Electronic Signature(s) Signed: 09/18/2019 2:58:07 PM By: Montey Hora Entered By: Montey Hora on 09/18/2019 13:04:19 [name], [name] ([id number]) -------------------------------------------------------------------------------- Wound Assessment Details Patient Name: [name]. Date of Service: 09/18/2019 12:45 PM Medical Record Number: [id number] Patient Account Number: [id number] Date of Birth/Sex: [birth year] (79 y.o. F) Treating RN: Army Melia Primary Care Nickole Adamek: Lemmie Evens Other Clinician: Referring Solomon Skowronek: Lemmie Evens Treating Shameca Landen/Extender: STONE III, HOYT Weeks in Treatment: 10 Wound Status Wound Number: 1 Primary Trauma, Other Etiology: Wound Location: Left Lower Leg - Lateral Wound Open Wounding Event: Trauma Status: Date Acquired: 06/18/2019 Comorbid Cataracts, Asthma, Chronic Obstructive Pulmonary Disease Weeks Of Treatment: 10 History: (COPD), Arrhythmia, Coronary Artery Disease, Hypertension, Clustered Wound: No Hypotension, Myocardial Infarction, Peripheral Arterial Disease Photos Wound Measurements Length: (cm) 0.5 Width: (cm) 0.5 Depth: (cm) 0.1 Area: (cm) 0.196 Volume: (cm) 0.02 % Reduction in Area: 98.4% % Reduction in Volume: 99.7% Epithelialization: None Wound Description Classification: Full Thickness Without Exposed Support Structure Wound Margin: Flat and Intact Exudate Amount: None Present s Foul Odor After Cleansing: No Slough/Fibrino Yes Wound Bed Granulation Amount: None Present (0%) Exposed Structure Necrotic Amount: Medium (34-66%) Fascia Exposed: No Necrotic Quality: Adherent Slough Fat Layer (Subcutaneous Tissue) Exposed: No Tendon Exposed: No Muscle Exposed: No Joint Exposed: No Bone Exposed: No Treatment Notes Wound #1 (Left, Lateral Lower Leg) Notes adaptic, ABD, 3 layer Electronic Signature(s) Signed: 09/18/2019 2:56:13 PM By: Lorelle Gibbs, [name] ([id number]) Entered By:  Army Melia on 09/18/2019 12:52:35 [name], [name] ([id number]) -------------------------------------------------------------------------------- Vitals Details Patient Name: [name] Date of Service: 09/18/2019 12:45 PM Medical Record Number: [id number] Patient Account Number: [id number] Date of Birth/Sex: 1940-09-07 (79 y.o. F) Treating RN: Montey Hora Primary Care Isac Lincks: Lemmie Evens Other Clinician: Referring Iyesha Such: Lemmie Evens Treating Kalani Sthilaire/Extender: STONE III, HOYT Weeks in Treatment: 10 Vital Signs Time Taken: 12:40 Temperature (F): 98.0 Height (in): 67 Pulse (bpm): 61 Weight (lbs): 180 Respiratory Rate (breaths/min): 16 Body Mass Index (BMI): 28.2 Blood Pressure (mmHg): 148/56 Reference Range: 80 - 120 mg / dl Electronic Signature(s) Signed: 09/18/2019 3:02:39 PM By: Lorine Bears RCP, RRT, CHT Entered By: Lorine Bears on 09/18/2019 12:46:16

## 2019-09-24 NOTE — Progress Notes (Deleted)
Cardiology Office Note  Date:  09/24/2019   ID:  [name], DOB September 27, 1940, MRN [id number]  PCP:  Lemmie Evens, MD   No chief complaint on file.   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  SOB, weight up Reports that she has never weighed this much Typical weight is in the 170 range, weight today 193 pounds Reports that she is taking Lasix faithfully, unclear degree of her fluid intake Reports she was unable to tolerate bisoprolol, caused diarrhea Walked long distance into the hospital, did not think she was going to make it, very short of breath.  Heart rate up to 130 A. fib on exam today  Anticoagulation previously held for anemia Hemoglobin has improved to 11.9 Followed by oncology/hematology Stable over the past several months  Recent back surgery with Dr. Arnoldo Morale, reports improved pain in her upper back around her shoulder blades Feels she recovered well Has follow-up next month Prior MRI  showing moderate to severe spinal stenosis, severe foraminal stenosis bilaterally, vertebral compression fractures  EKG personally reviewed by myself on todays visit Shows narrow complex tachycardia concerning for atrial fibrillation rate 134 bpm  Other past medical history reviewed Off plavix and asa, pradaxa : "[facility] stopped it" creatinine 2.36 in July 2019 CR 1.6 in dec 2019   fall late 2019  Echocardiogram ejection fraction 65-70  percent  Previous workup for lower extremity vascular disease   showing moderate nonobstructive disease, medical management recommended    PMH:   has a past medical history of Anemia, CAD (coronary artery disease), Carotid artery disease ([place]), Cervical disc disease, CKD (chronic kidney disease), stage III, Closed fracture of multiple pubic rami ([place]) (06/24/2018), COPD (chronic obstructive pulmonary disease) ([place]), Diastolic dysfunction, Dysphagia, GERD (gastroesophageal reflux disease), History of kidney stones, Hypertension, Hypertriglyceridemia, Hypothyroidism, Myocardial infarction ([place]) (2003), PAD (peripheral artery disease) ([place]), PAF (paroxysmal atrial fibrillation) ([place]), PONV (postoperative nausea and vomiting), and Right renal artery stenosis ([place]).  PSH:    Past Surgical History:  Procedure Laterality Date  . ABDOMINAL HYSTERECTOMY    . AMPUTATION Right 04/12/2014   Procedure: Revision AMPUTATION Right Long DIGIT;  Surgeon: Leanora Cover, MD;  Location: [place];  Service: Orthopedics;  Laterality: Right;  . APPLICATION OF WOUND VAC Left 01/06/2018   Procedure: APPLICATION OF WOUND VAC;  Surgeon: Samara Deist, DPM;  Location: [facility];  Service: Podiatry;  Laterality: Left;  . BALLOON DILATION  05/05/2011   Procedure: BALLOON DILATION;  Surgeon: Rogene Houston, MD;  [facility];  Service: Endoscopy;  Laterality: N/A;  . CARDIAC CATHETERIZATION     with stent 2004  CYPHER 2.75 x 6mm coronary stent in the mid left anterior descending stenotic lesion    Last stress test showed EF of 775  . CARDIOVERSION    . CATARACT EXTRACTION Left 2002  . CATARACT EXTRACTION W/PHACO Right 05/14/2013   Procedure: CATARACT EXTRACTION PHACO AND INTRAOCULAR LENS PLACEMENT RIGHT EYE;  Surgeon:  Tonny Branch, MD;  Location: [facility];  Service: Ophthalmology;  Laterality: Right;  CDE:  20.45  . CERVICAL DISC SURGERY    . CHOLECYSTECTOMY    . CORONARY ANGIOPLASTY     1 stent  . ESOPHAGEAL DILATION  N/A 04/25/2015   Procedure: ESOPHAGEAL DILATION;  Surgeon: Rogene Houston, MD;  [facility];  Service: Endoscopy;  Laterality: N/A;  . ESOPHAGOGASTRODUODENOSCOPY N/A 04/25/2015   Procedure: ESOPHAGOGASTRODUODENOSCOPY (EGD);  Surgeon: Rogene Houston, MD;  [facility];  Service: Endoscopy;  Laterality: N/A;  1225  . IR SACROPLASTY BILATERAL  06/13/2018  . IRRIGATION AND DEBRIDEMENT FOOT Left 01/06/2018   Procedure: IRRIGATION AND DEBRIDEMENT FOOT;  Surgeon: Samara Deist, DPM;  Location: [facility];  Service: Podiatry;  Laterality: Left;  Marland Kitchen MALONEY DILATION  05/05/2011   Procedure: MALONEY DILATION;  Surgeon: Rogene Houston, MD;  [facility];  Service: Endoscopy;  Laterality: N/A;  . right knee arthroscopy  2010  . SAVORY DILATION  05/05/2011   Procedure: SAVORY DILATION;  Surgeon: Rogene Houston, MD;  [facility];  Service: Endoscopy;  Laterality: N/A;  . SHOULDER SURGERY Right    torn rotator cuff  . TOENAIL EXCISION    . TOTAL ABDOMINAL HYSTERECTOMY W/ BILATERAL SALPINGOOPHORECTOMY      Current Outpatient Medications  Medication Sig Dispense Refill  . acetaminophen (TYLENOL) 500 MG tablet Take 500-1,000 mg by mouth 2 (two) times daily as needed for headache (pain). Lunch and bedtime    . albuterol (PROVENTIL HFA;VENTOLIN HFA) 108 (90 Base) MCG/ACT inhaler Inhale 2 puffs into the lungs every 4 (four) hours as needed for wheezing or shortness of breath.    Marland Kitchen albuterol (PROVENTIL) (2.5 MG/3ML) 0.083% nebulizer solution Take 2.5 mg by nebulization every 6 (six) hours as needed for shortness of breath.     . Ascorbic Acid (VITAMIN C) 1000 MG tablet Take 1,000 mg by mouth daily.    . bisacodyl (DULCOLAX) 10 MG suppository Place 1 suppository (10 mg total) rectally daily as needed for moderate constipation. 12 suppository 0  . calcitRIOL (ROCALTROL) 0.5 MCG capsule Take 0.5 mcg by mouth every other day.     . cetirizine (ZYRTEC) 10 MG tablet Take  10 mg by mouth daily.     . clonazePAM (KLONOPIN) 1 MG tablet Take 2-3 mg by mouth at bedtime.     . diphenhydrAMINE (BENADRYL) 25 mg capsule Take 1 capsule (25 mg total) by mouth every 4 (four) hours as needed for itching. 30 capsule 0  . Fluticasone-Salmeterol (ADVAIR) 500-50 MCG/DOSE AEPB Inhale 1 puff into the lungs 2 (two) times daily.    . isosorbide mononitrate (IMDUR) 60 MG 24 hr tablet TAKE ONE TABLET (60MG  TOTAL) BY MOUTH TWO TIMES DAILY 180 tablet 0  . levothyroxine (SYNTHROID) 100 MCG tablet Take 100 mcg by mouth daily.    . metoprolol succinate (TOPROL-XL) 25 MG 24 hr tablet Take 1 tablet (25 mg total) by mouth daily. 90 tablet 2  . nitroGLYCERIN (NITROLINGUAL) 0.4 MG/SPRAY spray PLACE ONE SPRAY UNDER THE TONGUE EVERY 5MINUTES FOR 3 DOSES AS NEEDEDFOR CHEST PAIN. 12 g 3  . omega-3 acid ethyl esters (LOVAZA) 1 G capsule Take 2 g by mouth 2 (two) times daily.      Marland Kitchen torsemide (DEMADEX) 20 MG tablet Take 2 tabs (40 mg) twice daily for wt > 185 lbs, take 1 tab (20 mg) twice daily for wt 180-185 lbs, take 1 tab (20 mg) once  daily for weight < 180 lbs 180 tablet 1  . vitamin B-12 (CYANOCOBALAMIN) 1000 MCG tablet Take 1,000 mcg by mouth daily.      No current facility-administered medications for this visit.     Allergies:   Ciprofloxacin, Penicillins, Amlodipine, Morphine and related, Cefaclor, Codeine, Lipitor [atorvastatin], Prednisone, and Sulfa antibiotics   Social History:  The patient  reports that she has been smoking cigarettes. She has a 56.00 pack-year smoking history. She has never used smokeless tobacco. She reports current alcohol use of about 1.0 standard drinks of alcohol per week. She reports that she does not use drugs.   Family History:   family history includes Diabetes in her mother; Heart attack in her father; Heart disease in her brother and sister; Heart failure in her mother; Hypertension in her sister; Pancreatic cancer in her sister; Stroke in her mother.     Review of Systems: Review of Systems  Constitutional: Negative.        Weight gain  HENT: Negative.   Respiratory: Positive for shortness of breath.   Cardiovascular: Positive for leg swelling.  Gastrointestinal: Negative.   Musculoskeletal: Positive for back pain.  Neurological: Negative.   Psychiatric/Behavioral: Negative.   All other systems reviewed and are negative.   PHYSICAL EXAM: VS:  There were no vitals taken for this visit. , BMI There is no height or weight on file to calculate BMI. Constitutional:  oriented to person, place, and time. No distress.  Presents in a wheelchair HENT:  Head: Grossly normal Eyes:  no discharge. No scleral icterus.  Neck: No JVD, no carotid bruits  Cardiovascular: Irregularly irregular, no murmurs appreciated 2+ pitting lower extremity edema to the thighs Pulmonary/Chest: Poor breath sounds, wheezing Abdominal: Soft.  no distension.  no tenderness.  Musculoskeletal: Normal range of motion Neurological:  normal muscle tone. Coordination normal. No atrophy Skin: Skin warm and dry Psychiatric: normal affect, pleasant   Recent Labs: 01/30/2019: Creatinine, Ser 0.80 08/07/2019: Hemoglobin 13.0   Lipid Panel No results found for: CHOL, HDL, LDLCALC, TRIG    Wt Readings from Last 3 Encounters:  06/26/19 193 lb (87.5 kg)  02/13/19 181 lb 12 oz (82.4 kg)  11/16/18 170 lb (77.1 kg)      ASSESSMENT AND PLAN:  Paroxysmal atrial fibrillation ([place]) - Plan: EKG 12-Lead EKG concerning for recurrent arrhythmia on today's visit in the setting of fluid overload We will change her Lasix to torsemide, details below --- Hemoglobin stable, Pradaxa previously held for anemia, held back 2019 Amiodarone and metoprolol previously held for bradycardia No recent falls but she is a fall risk -We will discuss with her on restarting Pradaxa Recommend she restart metoprolol succinate 25 daily given rapid rate concern for recurrent atrial  fibrillation -Likely worsening her heart failure symptoms  Essential hypertension - We will add metoprolol succinate 25 daily She did not tolerate bisoprolol Continue isosorbide  Chronic back pain Referral made to neurosurgery in [facility] Spinal stenosis, foraminal stenosis, vertebral fractures Referral discussed with her.  Reports that she is unable to tolerate pain medication, having a difficult time getting around secondary to progressive symptoms  Coronary artery disease involving native coronary artery of native heart without angina pectoris -  Denies any anginal symptoms No ischemic work-up at this time Smoking cessation recommended  Acute on chronic renal failure Stage IV Followed in [facility] No recent lab work available  Acute on chronic diastolic CHF Weight is up 15 to 20 pounds Massive pitting edema Possibly exacerbated by rhythm  change Stressed importance of moderating her fluid intake, compliance with diuretic Suggested she follow the diuretic regiment detailed below: Torsemide 40 mg twice a day for weight >185 Torsemide 20 mg twice a day for weight  180 - 185 Torsemide 20 mg once a day for weight <180  Lower extremity edema Prior clinic visit had no significant leg edema, now with massive weight gain and edema, worse shortness of breath Plan as above  Mixed hyperlipidemia She was previously on Zetia, this appears to have fallen off her list Were discussed with her in follow-up  PAD Lower extremity arterial stenosis, 50-75% stenosis mid right SFA She is very sedentary at baseline,  Does not want a statin, will look to restart Zetia  SOB (shortness of breath)  chronic bronchitis.  COPD, long smoking history Uses nebulizers Now with acute on chronic diastolic CHF REDS VEST score is 30% on today's visit  Leg pain /back pain Chronic issue, Improved symptoms in her upper thoracic back, pain improved She has follow-up with Dr. Arnoldo Morale  Very  complicated medical issues as detailed above  Total encounter time more than 45 minutes  Greater than 50% was spent in counseling and coordination of care with the patient   No orders of the defined types were placed in this encounter.    Signed, Esmond Plants, M.D., Ph.D. 09/24/2019  [place], [place]

## 2019-09-25 ENCOUNTER — Encounter: Payer: Medicare HMO | Admitting: Physician Assistant

## 2019-09-25 ENCOUNTER — Ambulatory Visit: Payer: Medicare HMO | Admitting: Cardiovascular Disease

## 2019-09-25 ENCOUNTER — Other Ambulatory Visit: Payer: Self-pay

## 2019-09-25 DIAGNOSIS — L97812 Non-pressure chronic ulcer of other part of right lower leg with fat layer exposed: Secondary | ICD-10-CM | POA: Diagnosis not present

## 2019-09-25 NOTE — Progress Notes (Addendum)
CHAVONNE, SFORZA ([id number]) Visit Report for 09/25/2019 Chief Complaint Document Details Patient Name: [name], [name]. Date of Service: 09/25/2019 10:30 AM Medical Record Number: [id number] Patient Account Number: [id number] Date of Birth/Sex: 07/25/1940 (79 y.o. F) Treating RN: Montey Hora Primary Care Provider: Lemmie Evens Other Clinician: Referring Provider: Lemmie Evens Treating Provider/Extender: Melburn Hake, Terald Jump Weeks in Treatment: 11 Information Obtained from: Patient Chief Complaint Bilateral LE ulcers Electronic Signature(s) Signed: 09/25/2019 10:43:41 AM By: Worthy Keeler PA-C Entered By: Worthy Keeler on 09/25/2019 10:43:41 [name], [name] ([id number]) -------------------------------------------------------------------------------- HPI Details Patient Name: [name], [name] Date of Service: 09/25/2019 10:30 AM Medical Record Number: [id number] Patient Account Number: [id number] Date of Birth/Sex: 1940-10-01 (79 y.o. F) Treating RN: Montey Hora Primary Care Provider: Lemmie Evens Other Clinician: Referring Provider: Lemmie Evens Treating Provider/Extender: Melburn Hake, Dashiell Franchino Weeks in Treatment: 11 History of Present Illness HPI Description: 07/10/2019 on evaluation today patient presents for initial inspection here in our clinic concerning wounds that she sustained as a result of traumatic injuries to her left lower leg as well as her right lower leg. The right side is much less severe the left side actually has muscle exposed. This left side she actually had in a car door and I think it did tear into her quite a bit more than the trauma wound which was more of an abrasion on the right lower extremity. Nonetheless she is having a lot of drainage from the left. This is the one obviously that is deeper down to the muscle area. With that being said the patient also has paroxysmal atrial fibrillation, hypertension, coronary artery disease, peripheral vascular  disease, and nicotine dependence. Currently she is not having a lot of severe pain although there is some discomfort. No fevers, chills, nausea, vomiting, or diarrhea. Patient did have arterial studies performed on 05/23/2019 which revealed a left ABI of 1.08 and a right ABI of 0.95. 07/17/2019 on evaluation today patient appears to be doing quite well with regard to her wounds. She has been showing signs of actually dramatic improvement in just 1 week in my opinion. In fact even the area where the muscle is exposed is showing already good signs of new granulation tissue over top of the muscle which is exactly what we want to see. Overall I am very pleased at this point. 07/24/2019 upon evaluation today patient appears to be doing well with regard to her wounds. In fact the right lower extremity ulcer appears to be almost completely healed which is great news some of the dressing material stuck to the periwound region. In regard to the left lower extremity that is showing signs of improvement at slow but nonetheless is still making improvement at this point. 08/02/2019 upon evaluation today patient appears to be doing well with regard to her wounds. The right leg ulcer is actually completely healed the left leg ulcer though not healed is doing significantly better. I am very pleased with how things seem to be progressing. Especially in light of how deep this wound was initially. 08/07/19 upon evaluation today patient actually appears to be showing signs of good improvement which is excellent news. There does not appear to be any evidence of active infection which is also excellent news. Overall I'm pleased with the healing prospects currently. I do believe that this is going to continue to take some time but nonetheless I do feel like she's also making excellent improvement especially considering the depth and extent of her wound initially. 08/14/2019 on  evaluation today patient appears to be doing well  with regard to her wound. She has been tolerating the dressing changes without complication other than the fact that she states it feels like it is kind of hurting her at the top of the wrap. This may be due to the indications. Fortunately there is no signs of active infection at this time which is good news. No fevers, chills, nausea, vomiting, or diarrhea. Unfortunately she is having some issues with her breathing she states that she called her primary care office and they told her that her primary care provider, Dr. Meda Coffee, would not be back until August. That seems somewhat extreme to me but she was told if she had any issues she needed to go to the ER according to the patient. I contacted their office today actually spoke with both apparently Dr. Vickey Sages wife as well as the scheduler and got the patient appointment for tomorrow at 2. I am not sure who told her the other information. 08/21/2019 upon evaluation today patient appears to be doing very well with regard to the wound on her lower extremity. She has been tolerating the dressing changes without complication will be using a compression wrap but overall she seems to be tolerating this quite well. I am very pleased with how things are progressing. The patient likewise is happy to see improvement. I do think she could potentially benefit from a snap VAC that something that we can check into although again I am not sure that her insurance will cover this or if they do how much her portion may be. We will definitely let her know once we get the results back from authorization. 09/04/2019 on evaluation today patient actually appears to be doing quite well with regard to her wound. She has been tolerating the dressing changes without complication. Fortunately there is no signs of active infection at this time. No fevers, chills, nausea, vomiting, or diarrhea.. With that being said I do believe that the patient at this point is going to likely heal  fairly soon and I do not even see any need for sharp debridement today which is great news. 09/11/2019 upon evaluation today patient appears to actually be doing quite well with regard to her wound she has been tolerating the dressing changes without complication the wound is measuring better even compared to last week overall very pleased with the progress up to this point. 09/18/2019 upon evaluation today patient appears to be doing very well with regard to her wound on the leg currently. Fortunately there is no signs of active infection at this time. No fevers, chills, nausea, vomiting, or diarrhea. In general I am actually very pleased with how she seems to be progressing at this time. 09/25/2022 upon evaluation today patient appears to be doing very well with regard to her wounds. She has been tolerating the dressing changes without complication. Fortunately there is no signs of active infection at this time. No fevers, chills, nausea, vomiting, or diarrhea. Electronic Signature(s) Signed: 09/26/2019 5:20:52 PM By: Gertie Fey, Hickory ([id number]) Entered By: Worthy Keeler on 09/26/2019 17:20:51 PAMILA, MENDIBLES ([id number]) -------------------------------------------------------------------------------- Physical Exam Details Patient Name: [name], [name]. Date of Service: 09/25/2019 10:30 AM Medical Record Number: [id number] Patient Account Number: [id number] Date of Birth/Sex: [birth year] (79 y.o. F) Treating RN: Montey Hora Primary Care Provider: Lemmie Evens Other Clinician: Referring Provider: Lemmie Evens Treating Provider/Extender: STONE III, Chetara Kropp Weeks in Treatment: 48 Constitutional Well-nourished and well-hydrated in no  acute distress. Respiratory normal breathing without difficulty. Psychiatric this patient is able to make decisions and demonstrates good insight into disease process. Alert and Oriented x 3. pleasant and cooperative. Notes Patient's  wound currently is showing signs of excellent improvement there is a very small opening still remaining but the majority of the wound bed actually appears to be completely healed there was a small scab over top of this all in all I think she will likely be done by next week. Electronic Signature(s) Signed: 09/26/2019 5:21:07 PM By: Worthy Keeler PA-C Entered By: Worthy Keeler on 09/26/2019 17:21:07 [name], [name] ([id number]) -------------------------------------------------------------------------------- Physician Orders Details Patient Name: [name], [name] Date of Service: 09/25/2019 10:30 AM Medical Record Number: [id number] Patient Account Number: [id number] Date of Birth/Sex: [birth year] (79 y.o. 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 Edema Control o 3 Layer Compression System - Left Lower Extremity - cover healed wound site with telfa non adherent pad o Patient to wear own compression stockings - on right leg o Elevate legs to the level of the heart and pump ankles  as often as possible Electronic Signature(s) Signed: 09/25/2019 4:41:39 PM By: Montey Hora Signed: 09/26/2019 5:56:47 PM By: Worthy Keeler PA-C Entered By: Montey Hora on 09/25/2019 10:49:37 Debenedetto, [name] ([id number]) -------------------------------------------------------------------------------- Problem List Details Patient Name: [name], Kim C. Date of Service: 09/25/2019 10:30 AM Medical Record Number: [id number] Patient Account Number: [id number] Date of Birth/Sex: May 09, 1941 (79 y.o. F) Treating RN: Montey Hora Primary Care Provider: Lemmie Evens Other Clinician: Referring Provider: Lemmie Evens Treating Provider/Extender: Melburn Hake, Buck Mcaffee Weeks in Treatment: 11 Active Problems ICD-10 Evaluated Encounter Code Description Active Date Today Diagnosis S81.802A Unspecified open wound, left lower leg, initial encounter 07/10/2019 No Yes S81.801A Unspecified open wound, right lower leg, initial encounter 07/10/2019 No Yes L97.825 Non-pressure chronic ulcer of other part of left lower leg with muscle 07/10/2019 No Yes involvement without evidence of necrosis L97.812 Non-pressure chronic ulcer of other part of right lower leg with fat layer 07/10/2019 No Yes exposed I48.0 Paroxysmal atrial fibrillation 07/10/2019 No Yes I10 Essential (primary) hypertension 07/10/2019 No Yes I25.10 Atherosclerotic heart disease of native coronary artery without angina 07/10/2019 No Yes pectoris I73.89 Other specified peripheral vascular diseases 07/10/2019 No Yes F17.218 Nicotine dependence, cigarettes, with other nicotine-induced disorders 07/10/2019 No Yes Inactive Problems Resolved Problems Electronic Signature(s) Signed: 09/25/2019 10:43:25 AM By: Worthy Keeler PA-C Entered By: Worthy Keeler on 09/25/2019 10:43:24 Mikrut, [name] ([id number]) Barcomb, [name] ([id number]) -------------------------------------------------------------------------------- Progress Note Details Patient Name:  [name] Date of Service: 09/25/2019 10:30 AM Medical Record Number: [id number] Patient Account Number: [id number] Date of Birth/Sex: Apr 17, 1941 (79 y.o. F) Treating RN: Montey Hora Primary Care Provider: Lemmie Evens Other Clinician: Referring Provider: Lemmie Evens Treating Provider/Extender: Melburn Hake, Rotunda Worden  Weeks in Treatment: 11 Subjective Chief Complaint Information obtained from Patient Bilateral LE ulcers History of Present Illness (HPI) 07/10/2019 on evaluation today patient presents for initial inspection here in our clinic concerning wounds that she sustained as a result of traumatic injuries to her left lower leg as well as her right lower leg. The right side is much less severe the left side actually has muscle exposed. This left side she actually had in a car door and I think it did tear into her quite a bit more than the trauma wound which was more of an abrasion on the right lower extremity. Nonetheless she is having a lot of drainage from the left. This is the one obviously that is deeper down to the muscle area. With that being said the patient also has paroxysmal atrial fibrillation, hypertension, coronary artery disease, peripheral vascular disease, and nicotine dependence. Currently she is not having a lot of severe pain although there is some discomfort. No fevers, chills, nausea, vomiting, or diarrhea. Patient did have arterial studies performed on 05/23/2019 which revealed a left ABI of 1.08 and a right ABI of 0.95. 07/17/2019 on evaluation today patient appears to be doing quite well with regard to her wounds. She has been showing signs of actually dramatic improvement in just 1 week in my opinion. In fact even the area where the muscle is exposed is showing already good signs of new granulation tissue over top of the muscle which is exactly what we want to see. Overall I am very pleased at this point. 07/24/2019 upon evaluation today patient appears to be  doing well with regard to her wounds. In fact the right lower extremity ulcer appears to be almost completely healed which is great news some of the dressing material stuck to the periwound region. In regard to the left lower extremity that is showing signs of improvement at slow but nonetheless is still making improvement at this point. 08/02/2019 upon evaluation today patient appears to be doing well with regard to her wounds. The right leg ulcer is actually completely healed the left leg ulcer though not healed is doing significantly better. I am very pleased with how things seem to be progressing. Especially in light of how deep this wound was initially. 08/07/19 upon evaluation today patient actually appears to be showing signs of good improvement which is excellent news. There does not appear to be any evidence of active infection which is also excellent news. Overall I'm pleased with the healing prospects currently. I do believe that this is going to continue to take some time but nonetheless I do feel like she's also making excellent improvement especially considering the depth and extent of her wound initially. 08/14/2019 on evaluation today patient appears to be doing well with regard to her wound. She has been tolerating the dressing changes without complication other than the fact that she states it feels like it is kind of hurting her at the top of the wrap. This may be due to the indications. Fortunately there is no signs of active infection at this time which is good news. No fevers, chills, nausea, vomiting, or diarrhea. Unfortunately she is having some issues with her breathing she states that she called her primary care office and they told her that her primary care provider, Dr. Meda Coffee, would not be back until August. That seems somewhat extreme to me but she was told if she had any issues she needed to go to the ER according to the patient.  I contacted their office today actually spoke  with both apparently Dr. Vickey Sages wife as well as the scheduler and got the patient appointment for tomorrow at 2. I am not sure who told her the other information. 08/21/2019 upon evaluation today patient appears to be doing very well with regard to the wound on her lower extremity. She has been tolerating the dressing changes without complication will be using a compression wrap but overall she seems to be tolerating this quite well. I am very pleased with how things are progressing. The patient likewise is happy to see improvement. I do think she could potentially benefit from a snap VAC that something that we can check into although again I am not sure that her insurance will cover this or if they do how much her portion may be. We will definitely let her know once we get the results back from authorization. 09/04/2019 on evaluation today patient actually appears to be doing quite well with regard to her wound. She has been tolerating the dressing changes without complication. Fortunately there is no signs of active infection at this time. No fevers, chills, nausea, vomiting, or diarrhea.. With that being said I do believe that the patient at this point is going to likely heal fairly soon and I do not even see any need for sharp debridement today which is great news. 09/11/2019 upon evaluation today patient appears to actually be doing quite well with regard to her wound she has been tolerating the dressing changes without complication the wound is measuring better even compared to last week overall very pleased with the progress up to this point. 09/18/2019 upon evaluation today patient appears to be doing very well with regard to her wound on the leg currently. Fortunately there is no signs of active infection at this time. No fevers, chills, nausea, vomiting, or diarrhea. In general I am actually very pleased with how she seems to be progressing at this time. 09/25/2022 upon evaluation today  patient appears to be doing very well with regard to her wounds. She has been tolerating the dressing changes without complication. Fortunately there is no signs of active infection at this time. No fevers, chills, nausea, vomiting, or diarrhea. [name], [name] ([id number]) Objective Constitutional Well-nourished and well-hydrated in no acute distress. Vitals Time Taken: 10:25 AM, Height: 67 in, Weight: 180 lbs, BMI: 28.2, Temperature: 97.9 F, Pulse: 97 bpm, Respiratory Rate: 18 breaths/min, Blood Pressure: 149/88 mmHg. Respiratory normal breathing without difficulty. Psychiatric this patient is able to make decisions and demonstrates good insight into disease process. Alert and Oriented x 3. pleasant and cooperative. General Notes: Patient's wound currently is showing signs of excellent improvement there is a very small opening still remaining but the majority of the wound bed actually appears to be completely healed there was a small scab over top of this all in all I think she will likely be done by next week. Integumentary (Hair, Skin) Wound #1 status is Healed - Epithelialized. Original cause of wound was Trauma. The wound is located on the Left,Lateral Lower Leg. The wound measures 0cm length x 0cm width x 0cm depth; 0cm^2 area and 0cm^3 volume. There is a none present amount of drainage noted. The wound margin is flat and intact. There is no granulation within the wound bed. There is a medium (34-66%) amount of necrotic tissue within the wound bed including Adherent Slough. 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 Post procedure Diagnosis Wound #: Same as Pre-Procedure [name], [name] ([id number]) Plan Follow-up Appointments: Return Appointment in 1 week. Edema Control: 3 Layer Compression System - Left Lower Extremity - cover healed wound site with telfa non adherent pad Patient to wear own compression stockings - on right leg Elevate legs to the level of the heart and pump ankles as often as possible 1. I would recommend currently that we go ahead and continue with the current wound care measures for the patient. Specifically we are going to initiate treatment with a 3 layer compression wrap and over top of this we will use a nonadherent dressing so that nothing will stick to what appears to be new skin to be honest at this point. I think the wound is actually completely healed. We just need to allow this area to toughen up. 2. We recommend as well the patient elevate her legs much as possible keep edema under good control. At next week's visit we will plan for discharge. Electronic Signature(s) Signed: 09/26/2019 5:21:54 PM By: Worthy Keeler PA-C Entered By: Worthy Keeler on 09/26/2019 17:21:54 [name], [name] ([id number]) -------------------------------------------------------------------------------- SuperBill Details Patient Name: [name], [name] Date of Service: 09/25/2019 Medical Record Number: [id number] Patient Account Number: [id number] Date of Birth/Sex: Oct 18, 1940 (79 y.o. 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 L97.812 Non-pressure chronic ulcer of other part of right lower leg with fat layer exposed I48.0 Paroxysmal atrial fibrillation I10 Essential (primary) hypertension I25.10 Atherosclerotic heart disease of native coronary artery without angina pectoris I73.89 Other specified peripheral vascular diseases F17.218 Nicotine dependence, cigarettes, with other nicotine-induced disorders Facility Procedures CPT4 Code: 61607371 Description: (Facility Use Only) 29581LT - [place] LWR LT LEG Modifier: Quantity: 1 Physician Procedures CPT4 Code Description: [id number] - WC PHYS LEVEL 3 - EST PT Modifier: Quantity: 1 CPT4 Code Description: ICD-10 Diagnosis Description S81.802A Unspecified open wound, left lower leg, initial encounter S81.801A Unspecified open wound, right lower leg, initial encounter L97.825 Non-pressure chronic ulcer of other part of left lower leg  with muscle involv L97.812 Non-pressure chronic ulcer of other part of right lower leg with fat layer ex Modifier: ement without evidenc posed Quantity: e of necrosis Electronic Signature(s) Signed: 09/26/2019 5:56:47 PM By: Worthy Keeler PA-C Previous Signature: 09/25/2019 4:41:39 PM Version By: Montey Hora Entered By: Worthy Keeler on 09/25/2019 23:43:17

## 2019-09-25 NOTE — Progress Notes (Signed)
[name], [name] ([id number]) Visit Report for 09/25/2019 Arrival Information Details Patient Name: [name], [name]. Date of Service: 09/25/2019 10:30 AM Medical Record Number: [id number] Patient Account Number: [id number] Date of Birth/Sex: 02/04/1941 (79 y.o. F) Treating RN: Montey Hora Primary Care Oveda Dadamo: Lemmie Evens Other Clinician: Referring Natayla Cadenhead: Lemmie Evens Treating Gumecindo Hopkin/Extender: Melburn Hake, HOYT Weeks in Treatment: 11 Visit Information History Since Last Visit Added or deleted any medications: No Patient Arrived: Walker Any new allergies or adverse reactions: No Arrival Time: 10:23 Had a fall or experienced change in No Accompanied By: self activities of daily living that may affect Transfer Assistance: None risk of falls: Patient Identification Verified: Yes Signs or symptoms of abuse/neglect since last visito No Secondary Verification Process Completed: Yes Hospitalized since last visit: No Patient Has Alerts: Yes Implantable device outside of the clinic excluding No Patient Alerts: ABI 05/23/2019 cellular tissue based products placed in the center (L)1.08 (R) 0.95 since last visit: TBI (L) 0.92 (R) 0.87 Has Dressing in Place as Prescribed: Yes Has Compression in Place as Prescribed: Yes Pain Present Now: No Electronic Signature(s) Signed: 09/25/2019 3:58:05 PM By: Lorine Bears RCP, RRT, CHT Entered By: Lorine Bears on 09/25/2019 10:25:36 [name], [name] ([id number]) -------------------------------------------------------------------------------- Compression Therapy Details Patient Name: [name] Date of Service: 09/25/2019 10:30 AM Medical Record Number: [id number] Patient Account Number: [id number] Date of Birth/Sex: 05/07/1941 (79 y.o. F) Treating RN: Montey Hora Primary Care Joshual Terrio: Lemmie Evens Other Clinician: Referring Coralee Edberg: Lemmie Evens Treating Lulie Hurd/Extender: STONE III,  HOYT Weeks in Treatment: 11 Compression Therapy Performed for Wound Assessment: NonWound Condition Scar / Keloid - Left Leg Performed By: Clinician Montey Hora, RN Compression Type: Three Layer Pre Treatment ABI: 1.1 Post Procedure Diagnosis Same as Pre-procedure Electronic Signature(s) Signed: 09/25/2019 4:41:39 PM By: Montey Hora Entered By: Montey Hora on 09/25/2019 10:48:49 Cessna, [name] ([id number]) -------------------------------------------------------------------------------- Encounter Discharge Information Details Patient Name: [name], [name]. Date of Service: 09/25/2019 10:30 AM Medical Record Number: [id number] Patient Account Number: [id number] Date of Birth/Sex: 12/29/1940 (79 y.o. F) Treating RN: Montey Hora Primary Care Aariah Godette: Lemmie Evens Other Clinician: Referring Samanvitha Germany: Lemmie Evens Treating Evetta Renner/Extender: Melburn Hake, HOYT Weeks in Treatment: 11 Encounter Discharge Information Items Discharge Condition: Stable Ambulatory Status: Walker Discharge Destination: Home Transportation: Private Auto Accompanied By: self Schedule Follow-up Appointment: Yes Clinical Summary of Care: Electronic Signature(s) Signed: 09/25/2019 4:41:39 PM By: Montey Hora Entered By: Montey Hora on 09/25/2019 10:50:51 Coltrane, [name] ([id number]) -------------------------------------------------------------------------------- Lower Extremity Assessment Details Patient Name: [name], [name]. Date of Service: 09/25/2019 10:30 AM Medical Record Number: [id number] Patient Account Number: [id number] Date of Birth/Sex: 07-20-1940 (79 y.o. F) Treating RN: Army Melia Primary Care Louanne Calvillo: Lemmie Evens Other Clinician: Referring Gwenlyn Hottinger: Lemmie Evens Treating Emelin Dascenzo/Extender: STONE III, HOYT Weeks in Treatment: 11 Edema Assessment Assessed: [Left: No] [Right: No] Edema: [Left: N] [Right: o] Calf Left: Right: Point of Measurement: 30 cm From Medial  Instep 36 cm cm Ankle Left: Right: Point of Measurement: 10 cm From Medial Instep 21 cm cm Vascular Assessment Pulses: Dorsalis Pedis Palpable: [Left:Yes] Electronic Signature(s) Signed: 09/25/2019 4:13:28 PM By: Army Melia Entered By: Army Melia on 09/25/2019 10:30:59 [name], [name] ([id number]) -------------------------------------------------------------------------------- Multi Wound Chart Details Patient Name: [name]. Date of Service: 09/25/2019 10:30 AM Medical Record Number: [id number] Patient Account Number: [id number] Date of Birth/Sex: February 11, 1941 (79 y.o. F) Treating RN: Montey Hora Primary Care Vianna Venezia: Lemmie Evens Other Clinician: Referring Forney Kleinpeter: Lemmie Evens Treating Wong Steadham/Extender: STONE III, HOYT Weeks in Treatment: 80  Vital Signs Height(in): 67 Pulse(bpm): 97 Weight(lbs): 180 Blood Pressure(mmHg): 149/88 Body Mass Index(BMI): 28 Temperature(F): 97.9 Respiratory Rate(breaths/min): 18 Photos: [N/A:N/A] Wound Location: Left, Lateral Lower Leg N/A N/A Wounding Event: Trauma N/A N/A Primary Etiology: Trauma, Other N/A N/A Comorbid History: Cataracts, Asthma, Chronic N/A N/A Obstructive Pulmonary Disease (COPD), Arrhythmia, Coronary Artery Disease, Hypertension, Hypotension, Myocardial Infarction, Peripheral Arterial Disease Date Acquired: 06/18/2019 N/A N/A Weeks of Treatment: 11 N/A N/A Wound Status: Healed - Epithelialized N/A N/A Measurements L x W x D (cm) 0x0x0 N/A N/A Area (cm) : 0 N/A N/A Volume (cm) : 0 N/A N/A % Reduction in Area: 100.00% N/A N/A % Reduction in Volume: 100.00% N/A N/A Classification: Full Thickness Without Exposed N/A N/A Support Structures Exudate Amount: None Present N/A N/A Wound Margin: Flat and Intact N/A N/A Granulation Amount: None Present (0%) N/A N/A Necrotic Amount: Medium (34-66%) N/A N/A Exposed Structures: Fascia: No N/A N/A Fat Layer (Subcutaneous Tissue) Exposed: No Tendon:  No Muscle: No Joint: No Bone: No Epithelialization: None N/A N/A Treatment Notes Electronic Signature(s) Signed: 09/25/2019 4:41:39 PM By: Ulyess Blossom, [name] ([id number]) Entered By: Montey Hora on 09/25/2019 10:47:40 [name], [name] ([id number]) -------------------------------------------------------------------------------- Multi-Disciplinary Care Plan Details Patient Name: [name], [name]. Date of Service: 09/25/2019 10:30 AM Medical Record Number: [id number] Patient Account Number: [id number] Date of Birth/Sex: 1940/09/06 (79 y.o. F) Treating RN: Montey Hora Primary Care Trinitey Roache: Lemmie Evens Other Clinician: Referring Kaegan Hettich: Lemmie Evens Treating Hays Dunnigan/Extender: Melburn Hake, HOYT Weeks in Treatment: 11 Active Inactive Abuse / Safety / Falls / Self Care Management Nursing Diagnoses: Potential for falls Goals: Patient will not experience any injury related to falls Date Initiated: 07/10/2019 Target Resolution Date: 10/13/2019 Goal Status: Active Interventions: Assess fall risk on admission and as needed Notes: Electronic Signature(s) Signed: 09/25/2019 4:41:39 PM By: Montey Hora Entered By: Montey Hora on 09/25/2019 10:47:33 [name], [name] ([id number]) -------------------------------------------------------------------------------- Non-Wound Condition Assessment Details Patient Name: [name]. Date of Service: 09/25/2019 10:30 AM Medical Record Number: [id number] Patient Account Number: [id number] Date of Birth/Sex: [birth year] (79 y.o. F) Treating RN: Montey Hora Primary Care Giavana Rooke: Lemmie Evens Other Clinician: Referring Stewart Pimenta: Lemmie Evens Treating Sofia Vanmeter/Extender: STONE III, HOYT Weeks in Treatment: 11 Non-Wound Condition: Condition: Scar / Keloid Location: Leg Side: Left Notes healed wound site Electronic Signature(s) Signed: 09/25/2019 4:41:39 PM By: Montey Hora Entered By: Montey Hora on 09/25/2019  10:48:24 [name], [name] ([id number]) -------------------------------------------------------------------------------- Pain Assessment Details Patient Name: [name]. Date of Service: 09/25/2019 10:30 AM Medical Record Number: [id number] Patient Account Number: [id number] Date of Birth/Sex: 07-Dec-1940 (79 y.o. F) Treating RN: Army Melia Primary Care Edwen Mclester: Lemmie Evens Other Clinician: Referring Ludia Gartland: Lemmie Evens Treating Gearlene Godsil/Extender: Melburn Hake, HOYT Weeks in Treatment: 11 Active Problems Location of Pain Severity and Description of Pain Patient Has Paino No Site Locations Pain Management and Medication Current Pain Management: Electronic Signature(s) Signed: 09/25/2019 4:13:28 PM By: Army Melia Entered By: Army Melia on 09/25/2019 10:30:20 [name] ([id number]) -------------------------------------------------------------------------------- Patient/Caregiver Education Details Patient Name: [name]. Date of Service: 09/25/2019 10:30 AM Medical Record Number: [id number] Patient Account Number: [id number] Date of Birth/Gender: January 20, 1941 (79 y.o. F) Treating RN: Montey Hora Primary Care Physician: Lemmie Evens Other Clinician: Referring Physician: Lemmie Evens Treating Physician/Extender: Sharalyn Ink in Treatment: 11 Education Assessment Education Provided To: Patient Education Topics Provided Venous: Handouts: Other: need for ongoing compression Methods: Explain/Verbal Responses: State content correctly Electronic Signature(s) Signed: 09/25/2019 4:41:39 PM By: Montey Hora Entered By: Montey Hora  on 09/25/2019 10:50:18 [name], [name] ([id number]) -------------------------------------------------------------------------------- Wound Assessment Details Patient Name: [name], [name]. Date of Service: 09/25/2019 10:30 AM Medical Record Number: [id number] Patient Account Number: [id number] Date of Birth/Sex:  05-08-1941 (79 y.o. F) Treating RN: Montey Hora Primary Care Mohsen Odenthal: Lemmie Evens Other Clinician: Referring Jamicia Haaland: Lemmie Evens Treating Sita Mangen/Extender: STONE III, HOYT Weeks in Treatment: 11 Wound Status Wound Number: 1 Primary Trauma, Other Etiology: Wound Location: Left, Lateral Lower Leg Wound Healed - Epithelialized Wounding Event: Trauma Status: Date Acquired: 06/18/2019 Comorbid Cataracts, Asthma, Chronic Obstructive Pulmonary Disease Weeks Of Treatment: 11 History: (COPD), Arrhythmia, Coronary Artery Disease, Hypertension, Clustered Wound: No Hypotension, Myocardial Infarction, Peripheral Arterial Disease Photos Wound Measurements Length: (cm) Width: (cm) Depth: (cm) Area: (cm) Volume: (cm) 0 % Reduction in Area: 100% 0 % Reduction in Volume: 100% 0 Epithelialization: None 0 0 Wound Description Classification: Full Thickness Without Exposed Support Structures Wound Margin: Flat and Intact Exudate Amount: None Present Foul Odor After Cleansing: No Slough/Fibrino Yes Wound Bed Granulation Amount: None Present (0%) Exposed Structure Necrotic Amount: Medium (34-66%) Fascia Exposed: No Necrotic Quality: Adherent Slough Fat Layer (Subcutaneous Tissue) Exposed: No Tendon Exposed: No Muscle Exposed: No Joint Exposed: No Bone Exposed: No Electronic Signature(s) Signed: 09/25/2019 4:41:39 PM By: Montey Hora Entered By: Montey Hora on 09/25/2019 10:46:16 [name], [name] ([id number]) -------------------------------------------------------------------------------- Vitals Details Patient Name: [name] Date of Service: 09/25/2019 10:30 AM Medical Record Number: [id number] Patient Account Number: [id number] Date of Birth/Sex: 07/17/1940 (79 y.o. F) Treating RN: Montey Hora Primary Care Shakeita Vandevander: Lemmie Evens Other Clinician: Referring Philamena Kramar: Lemmie Evens Treating Alvia Tory/Extender: STONE III, HOYT Weeks in Treatment:  11 Vital Signs Time Taken: 10:25 Temperature (F): 97.9 Height (in): 67 Pulse (bpm): 97 Weight (lbs): 180 Respiratory Rate (breaths/min): 18 Body Mass Index (BMI): 28.2 Blood Pressure (mmHg): 149/88 Reference Range: 80 - 120 mg / dl Electronic Signature(s) Signed: 09/25/2019 3:58:05 PM By: Lorine Bears RCP, RRT, CHT Entered By: Lorine Bears on 09/25/2019 10:26:55

## 2019-10-02 ENCOUNTER — Encounter: Payer: Medicare HMO | Admitting: Physician Assistant

## 2019-10-02 ENCOUNTER — Other Ambulatory Visit: Payer: Self-pay

## 2019-10-02 DIAGNOSIS — L97812 Non-pressure chronic ulcer of other part of right lower leg with fat layer exposed: Secondary | ICD-10-CM | POA: Diagnosis not present

## 2019-10-04 ENCOUNTER — Telehealth: Payer: Self-pay

## 2019-10-04 NOTE — Telephone Encounter (Signed)
Patient states he oxygen starting dropping when she went to bed, it went to 88 and after she put her oxygen on, it went to 92. States her eyes are blurry and she feels "shaky". States this morning it dropped to 70 this was after she did her breathing treatment.

## 2019-10-04 NOTE — Telephone Encounter (Signed)
Patient would like for Dr. Rockey Situ to write her a rx for a portable oxygen tank. Please call to discuss.

## 2019-10-04 NOTE — Telephone Encounter (Signed)
Spoke with the patient. Patient sts that she has emphysema and copd. She does have a O2 tank at home but she does not know who wrote the prescription. She does not she a pulmonologist.   Patient sts that she removed her O2 this morning to administer her inhaler and breathing treatment. Shortly after she felt shaky. Adv the patient that reaction can be expected.She checked her O2 sat and it was 70%. She resumed her O2 and her O2 recovered to 92%. She does not know how much O2 she is on (she sts that it is on low). She is in no acute distress. She is currently on her way to an eye doctor appt.  Her O2 supply is running low. Adv the patient that we do not write prescription for O2, adv her to contact her pcp Dr. Vickey Sages office. She is going to go to [place] to see if she can get a new tank.  Adv the patient to contact her pcp to discuss asap.Adv the patient that if she runs out of her O2 and her Sat drops below 90% I would recommend that she go to the ED for it to be addressed. Update and FYI will be sent to Dr. Rockey Situ. Patient agreeable with the plan and verbalized understanding.

## 2019-10-05 NOTE — Progress Notes (Signed)
GINNIFER, CREELMAN ([id number]) Visit Report for 10/02/2019 Chief Complaint Document Details Patient Name: [name], [name]. Date of Service: 10/02/2019 10:30 AM Medical Record Number: [id number] Patient Account Number: [id number] Date of Birth/Sex: 05-27-1941 (79 y.o. F) Treating RN: Montey Hora Primary Care Provider: Lemmie Evens Other Clinician: Referring Provider: Lemmie Evens Treating Provider/Extender: Melburn Hake, Mysti Haley Weeks in Treatment: 12 Information Obtained from: Patient Chief Complaint Bilateral LE ulcers Electronic Signature(s) Signed: 10/02/2019 11:40:01 AM By: Worthy Keeler PA-C Entered By: Worthy Keeler on 10/02/2019 11:40:00 Muro, [name] ([id number]) -------------------------------------------------------------------------------- HPI Details Patient Name: [name], [name] Date of Service: 10/02/2019 10:30 AM Medical Record Number: [id number] Patient Account Number: [id number] Date of Birth/Sex: August 14, 1940 (79 y.o. F) Treating RN: Montey Hora Primary Care Provider: Lemmie Evens Other Clinician: Referring Provider: Lemmie Evens Treating Provider/Extender: Melburn Hake, Ronelle Smallman Weeks in Treatment: 12 History of Present Illness HPI Description: 07/10/2019 on evaluation today patient presents for initial inspection here in our clinic concerning wounds that she sustained as a result of traumatic injuries to her left lower leg as well as her right lower leg. The right side is much less severe the left side actually has muscle exposed. This left side she actually had in a car door and I think it did tear into her quite a bit more than the trauma wound which was more of an abrasion on the right lower extremity. Nonetheless she is having a lot of drainage from the left. This is the one obviously that is deeper down to the muscle area. With that being said the patient also has paroxysmal atrial fibrillation, hypertension, coronary artery disease, peripheral vascular  disease, and nicotine dependence. Currently she is not having a lot of severe pain although there is some discomfort. No fevers, chills, nausea, vomiting, or diarrhea. Patient did have arterial studies performed on 05/23/2019 which revealed a left ABI of 1.08 and a right ABI of 0.95. 07/17/2019 on evaluation today patient appears to be doing quite well with regard to her wounds. She has been showing signs of actually dramatic improvement in just 1 week in my opinion. In fact even the area where the muscle is exposed is showing already good signs of new granulation tissue over top of the muscle which is exactly what we want to see. Overall I am very pleased at this point. 07/24/2019 upon evaluation today patient appears to be doing well with regard to her wounds. In fact the right lower extremity ulcer appears to be almost completely healed which is great news some of the dressing material stuck to the periwound region. In regard to the left lower extremity that is showing signs of improvement at slow but nonetheless is still making improvement at this point. 08/02/2019 upon evaluation today patient appears to be doing well with regard to her wounds. The right leg ulcer is actually completely healed the left leg ulcer though not healed is doing significantly better. I am very pleased with how things seem to be progressing. Especially in light of how deep this wound was initially. 08/07/19 upon evaluation today patient actually appears to be showing signs of good improvement which is excellent news. There does not appear to be any evidence of active infection which is also excellent news. Overall I'm pleased with the healing prospects currently. I do believe that this is going to continue to take some time but nonetheless I do feel like she's also making excellent improvement especially considering the depth and extent of her wound initially. 08/14/2019 on  evaluation today patient appears to be doing well  with regard to her wound. She has been tolerating the dressing changes without complication other than the fact that she states it feels like it is kind of hurting her at the top of the wrap. This may be due to the indications. Fortunately there is no signs of active infection at this time which is good news. No fevers, chills, nausea, vomiting, or diarrhea. Unfortunately she is having some issues with her breathing she states that she called her primary care office and they told her that her primary care provider, Dr. Meda Coffee, would not be back until August. That seems somewhat extreme to me but she was told if she had any issues she needed to go to the ER according to the patient. I contacted their office today actually spoke with both apparently Dr. Vickey Sages wife as well as the scheduler and got the patient appointment for tomorrow at 2. I am not sure who told her the other information. 08/21/2019 upon evaluation today patient appears to be doing very well with regard to the wound on her lower extremity. She has been tolerating the dressing changes without complication will be using a compression wrap but overall she seems to be tolerating this quite well. I am very pleased with how things are progressing. The patient likewise is happy to see improvement. I do think she could potentially benefit from a snap VAC that something that we can check into although again I am not sure that her insurance will cover this or if they do how much her portion may be. We will definitely let her know once we get the results back from authorization. 09/04/2019 on evaluation today patient actually appears to be doing quite well with regard to her wound. She has been tolerating the dressing changes without complication. Fortunately there is no signs of active infection at this time. No fevers, chills, nausea, vomiting, or diarrhea.. With that being said I do believe that the patient at this point is going to likely heal  fairly soon and I do not even see any need for sharp debridement today which is great news. 09/11/2019 upon evaluation today patient appears to actually be doing quite well with regard to her wound she has been tolerating the dressing changes without complication the wound is measuring better even compared to last week overall very pleased with the progress up to this point. 09/18/2019 upon evaluation today patient appears to be doing very well with regard to her wound on the leg currently. Fortunately there is no signs of active infection at this time. No fevers, chills, nausea, vomiting, or diarrhea. In general I am actually very pleased with how she seems to be progressing at this time. 09/25/2022 upon evaluation today patient appears to be doing very well with regard to her wounds. She has been tolerating the dressing changes without complication. Fortunately there is no signs of active infection at this time. No fevers, chills, nausea, vomiting, or diarrhea. 10/02/2019 upon evaluation today patient appears to be completely healed and is doing excellent in regard to her wounds. Fortunately there is no evidence of active infection at this time. In fact she seems to be doing excellent. [name], [name] ([id number]) Electronic Signature(s) Signed: 10/03/2019 9:09:17 PM By: Worthy Keeler PA-C Entered By: Worthy Keeler on 10/03/2019 21:09:17 [name], [name] ([id number]) -------------------------------------------------------------------------------- Physical Exam Details Patient Name: [name], [name]. Date of Service: 10/02/2019 10:30 AM Medical Record Number: [id number] Patient Account Number:  [id number] Date of Birth/Sex: 04-06-1941 (79 y.o. F) Treating RN: Montey Hora Primary Care Provider: Lemmie Evens Other Clinician: Referring Provider: Lemmie Evens Treating Provider/Extender: STONE III, Allyson Tineo Weeks in Treatment: 42 Constitutional Well-nourished and well-hydrated in no acute  distress. Respiratory normal breathing without difficulty. Psychiatric this patient is able to make decisions and demonstrates good insight into disease process. Alert and Oriented x 3. pleasant and cooperative. Notes His wound again showed signs of complete epithelization. Fortunately there is no evidence of active infection and overall I am extremely pleased with how things seem to be progressing. Electronic Signature(s) Signed: 10/03/2019 9:09:32 PM By: Worthy Keeler PA-C Entered By: Worthy Keeler on 10/03/2019 21:09:32 [name], [name] ([id number]) -------------------------------------------------------------------------------- Physician Orders Details Patient Name: [name], [name] Date of Service: 10/02/2019 10:30 AM Medical Record Number: [id number] Patient Account Number: [id number] Date of Birth/Sex: 07-Feb-1941 (79 y.o. F) Treating RN: Montey Hora Primary Care Provider: Lemmie Evens Other Clinician: Referring Provider: Lemmie Evens Treating Provider/Extender: Melburn Hake, Christiana Gurevich Weeks in Treatment: 12 Verbal / Phone Orders: No Diagnosis Coding Discharge From [facility] Services o Discharge from [place] Signature(s) Signed: 10/04/2019 4:50:17 PM By: Montey Hora Signed: 10/04/2019 5:29:46 PM By: Worthy Keeler PA-C Entered By: Montey Hora on 10/02/2019 11:16:26 Guertin, [name] ([id number]) -------------------------------------------------------------------------------- Problem List Details Patient Name: [name], [name]. Date of Service: 10/02/2019 10:30 AM Medical Record Number: [id number] Patient Account Number: [id number] Date of Birth/Sex: December 26, 1940 (79 y.o. 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 07/10/2019 No Yes involvement without evidence of necrosis L97.812 Non-pressure chronic ulcer of other part of right lower leg with fat layer 07/10/2019 No Yes exposed I48.0 Paroxysmal atrial fibrillation 07/10/2019 No Yes I10 Essential (primary) hypertension 07/10/2019 No Yes I25.10 Atherosclerotic heart disease of native coronary artery without angina 07/10/2019 No Yes pectoris I73.89 Other specified peripheral vascular diseases 07/10/2019 No Yes F17.218 Nicotine dependence, cigarettes, with other nicotine-induced disorders 07/10/2019 No Yes Inactive Problems Resolved Problems Electronic Signature(s) Signed: 10/02/2019 11:39:54 AM By: Worthy Keeler PA-C Entered By: Worthy Keeler on 10/02/2019 11:39:54 Grime, [name] ([id number]) Champeau, [name] ([id number]) -------------------------------------------------------------------------------- Progress Note Details Patient Name: Kim Ruff. Date of Service: 10/02/2019 10:30 AM Medical Record Number: [id number] Patient Account Number: [id number] Date of Birth/Sex: Jan 22, 1941 (79 y.o. F) Treating RN: Montey Hora Primary Care Provider: Lemmie Evens Other Clinician: Referring Provider: Lemmie Evens Treating Provider/Extender: Melburn Hake, Kierstan Auer Weeks in Treatment: 12 Subjective Chief Complaint Information obtained from Patient Bilateral LE ulcers History of Present Illness (HPI) 07/10/2019 on evaluation today patient presents for initial inspection here in our clinic concerning wounds that she sustained as a result of traumatic injuries to her left lower leg as well as her right lower leg. The right side is much less severe the left side actually has muscle exposed. This left side she  actually had in a car door and I think it did tear into her quite a bit more than the trauma wound which was more of an abrasion on the right lower extremity. Nonetheless she is having a lot of drainage from the left. This is  the one obviously that is deeper down to the muscle area. With that being said the patient also has paroxysmal atrial fibrillation, hypertension, coronary artery disease, peripheral vascular disease, and nicotine dependence. Currently she is not having a lot of severe pain although there is some discomfort. No fevers, chills, nausea, vomiting, or diarrhea. Patient did have arterial studies performed on 05/23/2019 which revealed a left ABI of 1.08 and a right ABI of 0.95. 07/17/2019 on evaluation today patient appears to be doing quite well with regard to her wounds. She has been showing signs of actually dramatic improvement in just 1 week in my opinion. In fact even the area where the muscle is exposed is showing already good signs of new granulation tissue over top of the muscle which is exactly what we want to see. Overall I am very pleased at this point. 07/24/2019 upon evaluation today patient appears to be doing well with regard to her wounds. In fact the right lower extremity ulcer appears to be almost completely healed which is great news some of the dressing material stuck to the periwound region. In regard to the left lower extremity that is showing signs of improvement at slow but nonetheless is still making improvement at this point. 08/02/2019 upon evaluation today patient appears to be doing well with regard to her wounds. The right leg ulcer is actually completely healed the left leg ulcer though not healed is doing significantly better. I am very pleased with how things seem to be progressing. Especially in light of how deep this wound was initially. 08/07/19 upon evaluation today patient actually appears to be showing signs of good improvement which is excellent news.  There does not appear to be any evidence of active infection which is also excellent news. Overall I'm pleased with the healing prospects currently. I do believe that this is going to continue to take some time but nonetheless I do feel like she's also making excellent improvement especially considering the depth and extent of her wound initially. 08/14/2019 on evaluation today patient appears to be doing well with regard to her wound. She has been tolerating the dressing changes without complication other than the fact that she states it feels like it is kind of hurting her at the top of the wrap. This may be due to the indications. Fortunately there is no signs of active infection at this time which is good news. No fevers, chills, nausea, vomiting, or diarrhea. Unfortunately she is having some issues with her breathing she states that she called her primary care office and they told her that her primary care provider, Dr. Meda Coffee, would not be back until August. That seems somewhat extreme to me but she was told if she had any issues she needed to go to the ER according to the patient. I contacted their office today actually spoke with both apparently Dr. Vickey Sages wife as well as the scheduler and got the patient appointment for tomorrow at 2. I am not sure who told her the other information. 08/21/2019 upon evaluation today patient appears to be doing very well with regard to the wound on her lower extremity. She has been tolerating the dressing changes without complication will be using a compression wrap but overall she seems to be tolerating this quite well. I am very pleased with how things are progressing. The patient likewise is happy to see improvement. I do think she could potentially benefit from a snap VAC that something that we can check into although again  I am not sure that her insurance will cover this or if they do how much her portion may be. We will definitely let her know once we  get the results back from authorization. 09/04/2019 on evaluation today patient actually appears to be doing quite well with regard to her wound. She has been tolerating the dressing changes without complication. Fortunately there is no signs of active infection at this time. No fevers, chills, nausea, vomiting, or diarrhea.. With that being said I do believe that the patient at this point is going to likely heal fairly soon and I do not even see any need for sharp debridement today which is great news. 09/11/2019 upon evaluation today patient appears to actually be doing quite well with regard to her wound she has been tolerating the dressing changes without complication the wound is measuring better even compared to last week overall very pleased with the progress up to this point. 09/18/2019 upon evaluation today patient appears to be doing very well with regard to her wound on the leg currently. Fortunately there is no signs of active infection at this time. No fevers, chills, nausea, vomiting, or diarrhea. In general I am actually very pleased with how she seems to be progressing at this time. 09/25/2022 upon evaluation today patient appears to be doing very well with regard to her wounds. She has been tolerating the dressing changes without complication. Fortunately there is no signs of active infection at this time. No fevers, chills, nausea, vomiting, or diarrhea. [name], [name] ([id number]) 10/02/2019 upon evaluation today patient appears to be completely healed and is doing excellent in regard to her wounds. Fortunately there is no evidence of active infection at this time. In fact she seems to be doing excellent. Objective Constitutional Well-nourished and well-hydrated in no acute distress. Vitals Time Taken: 11:05 AM, Height: 67 in, Weight: 180 lbs, BMI: 28.2, Temperature: 98.1 F, Pulse: 88 bpm, Respiratory Rate: 16 breaths/min, Blood Pressure: 149/89 mmHg. Respiratory normal breathing  without difficulty. Psychiatric this patient is able to make decisions and demonstrates good insight into disease process. Alert and Oriented x 3. pleasant and cooperative. General Notes: His wound again showed signs of complete epithelization. Fortunately there is no evidence of active infection and overall I am extremely pleased with how things seem to be progressing. 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 Services: Discharge from [place] 1. I would recommend that we discontinue wound care services and the patient is in agreement with that plan. 2. I am also can recommend currently that we go ahead and have her continue with a Tubigrip for the next week and then she should be able to discontinue that following. We will see the patient back for follow-up visit as needed. [name], [name] ([id number]) Electronic Signature(s) Signed: 10/03/2019 9:10:16 PM By: Worthy Keeler PA-C Entered By: Worthy Keeler on 10/03/2019 21:10:15 Scherrie, [place] [name] ([id number]) -------------------------------------------------------------------------------- SuperBill Details Patient Name: [name], [name] Date of Service: 10/02/2019 Medical Record Number: [id number] Patient Account Number: [id number] Date of Birth/Sex: 14-Apr-1941 (79  y.o. 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 L97.812 Non-pressure chronic ulcer of other part of right lower leg with fat layer exposed I48.0 Paroxysmal atrial fibrillation I10 Essential (primary) hypertension I25.10 Atherosclerotic heart disease of native coronary artery without angina pectoris I73.89 Other specified peripheral vascular diseases F17.218 Nicotine dependence, cigarettes, with other nicotine-induced disorders Facility Procedures CPT4 Code: 81017510 Description: 25852 - WOUND CARE VISIT-LEV 2 EST PT Modifier: Quantity: 1 Physician Procedures CPT4 Code Description: [id number] 53614 - WC PHYS LEVEL 3 - EST PT Modifier: Quantity: 1 CPT4 Code Description: ICD-10 Diagnosis Description S81.802A Unspecified open wound, left lower leg, initial encounter 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

## 2019-10-06 NOTE — Telephone Encounter (Signed)
Can we see if she got her oxygen Would she like referral to pulmonary?

## 2019-10-08 NOTE — Telephone Encounter (Signed)
Left voicemail message to call back  

## 2019-10-10 ENCOUNTER — Emergency Department
Admission: EM | Admit: 2019-10-10 | Discharge: 2019-10-10 | Disposition: A | Discharge status: Home / Self Care | Payer: Medicare HMO | Attending: Emergency Medicine | Admitting: Emergency Medicine

## 2019-10-10 ENCOUNTER — Other Ambulatory Visit: Payer: Self-pay

## 2019-10-10 ENCOUNTER — Ambulatory Visit: Admission: RE | Admit: 2019-10-10 | Payer: Medicare HMO | Source: Ambulatory Visit

## 2019-10-10 ENCOUNTER — Emergency Department: Payer: Medicare HMO

## 2019-10-10 DIAGNOSIS — M549 Dorsalgia, unspecified: Secondary | ICD-10-CM | POA: Diagnosis not present

## 2019-10-10 DIAGNOSIS — R05 Cough: Secondary | ICD-10-CM | POA: Insufficient documentation

## 2019-10-10 DIAGNOSIS — R109 Unspecified abdominal pain: Secondary | ICD-10-CM | POA: Diagnosis not present

## 2019-10-10 DIAGNOSIS — Z955 Presence of coronary angioplasty implant and graft: Secondary | ICD-10-CM | POA: Diagnosis not present

## 2019-10-10 DIAGNOSIS — I2510 Atherosclerotic heart disease of native coronary artery without angina pectoris: Secondary | ICD-10-CM | POA: Insufficient documentation

## 2019-10-10 DIAGNOSIS — F17210 Nicotine dependence, cigarettes, uncomplicated: Secondary | ICD-10-CM | POA: Diagnosis not present

## 2019-10-10 DIAGNOSIS — I130 Hypertensive heart and chronic kidney disease with heart failure and stage 1 through stage 4 chronic kidney disease, or unspecified chronic kidney disease: Secondary | ICD-10-CM | POA: Diagnosis not present

## 2019-10-10 DIAGNOSIS — Z89021 Acquired absence of right finger(s): Secondary | ICD-10-CM | POA: Insufficient documentation

## 2019-10-10 DIAGNOSIS — J449 Chronic obstructive pulmonary disease, unspecified: Secondary | ICD-10-CM | POA: Diagnosis not present

## 2019-10-10 DIAGNOSIS — I5030 Unspecified diastolic (congestive) heart failure: Secondary | ICD-10-CM | POA: Diagnosis not present

## 2019-10-10 DIAGNOSIS — N183 Chronic kidney disease, stage 3 unspecified: Secondary | ICD-10-CM | POA: Diagnosis not present

## 2019-10-10 DIAGNOSIS — E039 Hypothyroidism, unspecified: Secondary | ICD-10-CM | POA: Diagnosis not present

## 2019-10-10 LAB — CBC
HCT: 43.2 % (ref 36.0–46.0)
Hemoglobin: 14.7 g/dL (ref 12.0–15.0)
MCH: 34.5 pg — ABNORMAL HIGH (ref 26.0–34.0)
MCHC: 34 g/dL (ref 30.0–36.0)
MCV: 101.4 fL — ABNORMAL HIGH (ref 80.0–100.0)
Platelets: 164 10*3/uL (ref 150–400)
RBC: 4.26 MIL/uL (ref 3.87–5.11)
RDW: 13.5 % (ref 11.5–15.5)
WBC: 12.8 10*3/uL — ABNORMAL HIGH (ref 4.0–10.5)
nRBC: 0 % (ref 0.0–0.2)

## 2019-10-10 LAB — COMPREHENSIVE METABOLIC PANEL
ALT: 19 U/L (ref 0–44)
AST: 15 U/L (ref 15–41)
Albumin: 3.3 g/dL — ABNORMAL LOW (ref 3.5–5.0)
Alkaline Phosphatase: 57 U/L (ref 38–126)
Anion gap: 10 (ref 5–15)
BUN: 30 mg/dL — ABNORMAL HIGH (ref 8–23)
CO2: 32 mmol/L (ref 22–32)
Calcium: 9.2 mg/dL (ref 8.9–10.3)
Chloride: 101 mmol/L (ref 98–111)
Creatinine, Ser: 1.16 mg/dL — ABNORMAL HIGH (ref 0.44–1.00)
GFR calc Af Amer: 52 mL/min — ABNORMAL LOW (ref >60)
GFR calc non Af Amer: 45 mL/min — ABNORMAL LOW (ref >60)
Glucose, Bld: 114 mg/dL — ABNORMAL HIGH (ref 70–99)
Potassium: 3.6 mmol/L (ref 3.5–5.1)
Sodium: 143 mmol/L (ref 135–145)
Total Bilirubin: 1.4 mg/dL — ABNORMAL HIGH (ref 0.3–1.2)
Total Protein: 6.4 g/dL — ABNORMAL LOW (ref 6.5–8.1)

## 2019-10-10 LAB — URINALYSIS, COMPLETE (UACMP) WITH MICROSCOPIC
Bacteria, UA: NONE SEEN
Bilirubin Urine: NEGATIVE
Glucose, UA: NEGATIVE mg/dL
Hgb urine dipstick: NEGATIVE
Ketones, ur: NEGATIVE mg/dL
Nitrite: NEGATIVE
Protein, ur: NEGATIVE mg/dL
Specific Gravity, Urine: 1.008 (ref 1.005–1.030)
pH: 5 (ref 5.0–8.0)

## 2019-10-10 LAB — LIPASE, BLOOD: Lipase: 27 U/L (ref 11–51)

## 2019-10-10 MED ORDER — ONDANSETRON HCL 4 MG/2ML IJ SOLN
4.0000 mg | Freq: Once | INTRAMUSCULAR | Status: AC
  Administered 2019-10-10: 13:00:00 4 mg via INTRAVENOUS
  Filled 2019-10-10: qty 2

## 2019-10-10 MED ORDER — NITROFURANTOIN MONOHYD MACRO 100 MG PO CAPS: 100.0000 mg | ORAL_CAPSULE | Freq: Two times a day (BID) | ORAL | 0 refills | Status: AC

## 2019-10-10 MED ORDER — IPRATROPIUM-ALBUTEROL 0.5-2.5 (3) MG/3ML IN SOLN
3.0000 mL | Freq: Once | RESPIRATORY_TRACT | Status: AC
  Administered 2019-10-10: 3 mL via RESPIRATORY_TRACT
  Filled 2019-10-10: qty 3

## 2019-10-10 MED ORDER — SODIUM CHLORIDE 0.9% FLUSH: 3.0000 mL | Freq: Once | INTRAVENOUS | Status: DC

## 2019-10-10 MED ORDER — FENTANYL CITRATE (PF) 100 MCG/2ML IJ SOLN
25.0000 ug | Freq: Once | INTRAMUSCULAR | Status: AC
  Administered 2019-10-10: 25 ug via INTRAVENOUS
  Filled 2019-10-10: qty 2

## 2019-10-10 MED ORDER — ONDANSETRON 4 MG PO TBDP: 4.0000 mg | ORAL_TABLET | Freq: Three times a day (TID) | ORAL | 0 refills | Status: DC | PRN

## 2019-10-10 MED ORDER — ONDANSETRON HCL 4 MG/2ML IJ SOLN
4.0000 mg | Freq: Once | INTRAMUSCULAR | Status: AC
  Administered 2019-10-10: 4 mg via INTRAVENOUS
  Filled 2019-10-10: qty 2

## 2019-10-10 MED ORDER — KETOROLAC TROMETHAMINE 30 MG/ML IJ SOLN
15.0000 mg | Freq: Once | INTRAMUSCULAR | Status: AC
  Administered 2019-10-10: 15 mg via INTRAVENOUS
  Filled 2019-10-10: qty 1

## 2019-10-10 MED ORDER — OXYCODONE-ACETAMINOPHEN 5-325 MG PO TABS: 1.0000 | ORAL_TABLET | Freq: Two times a day (BID) | ORAL | 0 refills | Status: DC | PRN

## 2019-10-10 NOTE — ED Triage Notes (Addendum)
Pt comes via POV from home with c/o  abdominal pain and back pain. Pt states this started yesterday. Pt states problems with her kidneys.  Pt denies any N/V/D. Pt denies any CP or SOB  Pt states productive cough since receiving last COVID shot. Pt has noticeable wheezing. Pt states hx of COPD and Afib

## 2019-10-10 NOTE — ED Provider Notes (Signed)
[facility] Emergency Department Provider Note       Time seen: ----------------------------------------- 1:42 PM on 10/10/2019 -----------------------------------------   I have reviewed the triage vital signs and the nursing notes.  HISTORY   Chief Complaint Back Pain and Abdominal Pain    HPI [name] is a 79 y.o. female with a history of paroxysmal atrial fibrillation, chest pain, hyperlipidemia, hypothyroidism, bradycardia, COPD, kidney injury who presents to the ED for abdominal pain and back pain.  Patient states that started yesterday, reports she has problems with her kidneys.  She has had a kidney stone in the past.  She states she is had a productive cough since receiving her last Covid vaccination.  Past Medical History:  Diagnosis Date  . Anemia   . CAD (coronary artery disease)    a. 10/2002 PCI: LAD 35m (2.75x23 Cypher DES); b. 09/2004 Cath: LM nl, LAD 40p, patent stent, D1 30p, LCX 20p, diff dzs throughout, OM1 50p, OM2 60p, OM3 40p, RCA 40p-->Med Rx; c. 10/2012 MV: No ischemia/infarct.  . Carotid artery disease ([place])   . Cervical disc disease   . CKD (chronic kidney disease), stage III   . Closed fracture of multiple pubic rami ([place]) 06/24/2018  . COPD (chronic obstructive pulmonary disease) ([place])   . Diastolic dysfunction    a. 03/2017 Echo: Ef 60-65%, Gr2 DD, mild MR, mildly dil LA/RA, mildly to mod dil RV w/ nl fxn, PASP 25mmHg.  Marland Kitchen Dysphagia   . GERD (gastroesophageal reflux disease)   . History of kidney stones   . Hypertension    2002  . Hypertriglyceridemia   . Hypothyroidism   . Myocardial infarction ([place]) 2003  . PAD (peripheral artery disease) ([place])    a. 10/2017 ABI's R = 0.78, L = 0.88. Duplex showed borderline R CFA and SFA dzs->med rx.  Marland Kitchen PAF (paroxysmal atrial fibrillation) ([place])    a. Amio/Pradaxa (CHA2DS2VASc = 6).  Marland Kitchen PONV (postoperative nausea and vomiting)   . Right renal artery stenosis ([place])    a. 09/2017  Renal Artery Duplex: no significant LRA stenosis. RRA <60%.    Patient Active Problem List   Diagnosis Date Noted  . Chronic respiratory failure with hypoxia ([place])   . Fall   . Closed fracture of multiple pubic rami ([place]) 06/25/2018  . ARF (acute renal failure) ([place]) 01/02/2018  . AKI (acute kidney injury) ([place]) 12/19/2017  . COPD (chronic obstructive pulmonary disease) ([place]) 07/26/2017  . Chronic renal impairment, stage 3 (moderate) 07/19/2016  . Back pain 08/01/2015  . Bradycardia 08/01/2015  . Hypothyroidism 01/27/2015  . HTN (hypertension) 01/27/2015  . Neck pain 01/18/2014  . Paroxysmal atrial fibrillation ([place]) 03/26/2013  . Fatigue 03/26/2013  . SOB (shortness of breath) 03/26/2013  . Chest discomfort 03/26/2013  . CAD (coronary artery disease) 03/26/2013  . Smoker 03/26/2013  . Hyperlipidemia 03/26/2013    Past Surgical History:  Procedure Laterality Date  . ABDOMINAL HYSTERECTOMY    . AMPUTATION Right 04/12/2014   Procedure: Revision AMPUTATION Right Long DIGIT;  Surgeon: Leanora Cover, MD;  Location: [place];  Service: Orthopedics;  Laterality: Right;  . APPLICATION OF WOUND VAC Left 01/06/2018   Procedure: APPLICATION OF WOUND VAC;  Surgeon: Samara Deist, DPM;  Location: [facility];  Service: Podiatry;  Laterality: Left;  . BALLOON DILATION  05/05/2011   Procedure: BALLOON DILATION;  Surgeon: Rogene Houston, MD;  [facility];  Service: Endoscopy;  Laterality: N/A;  . CARDIAC CATHETERIZATION  with stent 2004  CYPHER 2.75 x 66mm coronary stent in the mid left anterior descending stenotic lesion    Last stress test showed EF of 775  . CARDIOVERSION    . CATARACT EXTRACTION Left 2002  . CATARACT EXTRACTION W/PHACO Right 05/14/2013   Procedure: CATARACT EXTRACTION PHACO AND INTRAOCULAR LENS PLACEMENT RIGHT EYE;  Surgeon: Tonny Branch, MD;  [facility];  Service: Ophthalmology;  Laterality: Right;  CDE:  20.45  . CERVICAL DISC SURGERY    . CHOLECYSTECTOMY     . CORONARY ANGIOPLASTY     1 stent  . ESOPHAGEAL DILATION N/A 04/25/2015   Procedure: ESOPHAGEAL DILATION;  Surgeon: Rogene Houston, MD;  [facility];  Service: Endoscopy;  Laterality: N/A;  . ESOPHAGOGASTRODUODENOSCOPY N/A 04/25/2015   Procedure: ESOPHAGOGASTRODUODENOSCOPY (EGD);  Surgeon: Rogene Houston, MD;  [facility];  Service: Endoscopy;  Laterality: N/A;  1225  . IR SACROPLASTY BILATERAL  06/13/2018  . IRRIGATION AND DEBRIDEMENT FOOT Left 01/06/2018   Procedure: IRRIGATION AND DEBRIDEMENT FOOT;  Surgeon: Samara Deist, DPM;  Location: [facility];  Service: Podiatry;  Laterality: Left;  Marland Kitchen MALONEY DILATION  05/05/2011   Procedure: MALONEY DILATION;  Surgeon: Rogene Houston, MD;  [facility];  Service: Endoscopy;  Laterality: N/A;  . right knee arthroscopy  2010  . SAVORY DILATION  05/05/2011   Procedure: SAVORY DILATION;  Surgeon: Rogene Houston, MD;  [facility];  Service: Endoscopy;  Laterality: N/A;  . SHOULDER SURGERY Right    torn rotator cuff  . TOENAIL EXCISION    . TOTAL ABDOMINAL HYSTERECTOMY W/ BILATERAL SALPINGOOPHORECTOMY      Allergies Ciprofloxacin, Penicillins, Amlodipine, Morphine and related, Cefaclor, Codeine, Lipitor [atorvastatin], Prednisone, and Sulfa antibiotics  Social History Social History   Tobacco Use  . Smoking status: Current Every Day Smoker    Packs/day: 1.00    Years: 56.00    Pack years: 56.00    Types: Cigarettes  . Smokeless tobacco: Never Used  Substance Use Topics  . Alcohol use: Yes    Alcohol/week: 1.0 standard drinks    Types: 1 Cans of beer per week  . Drug use: No    Review of Systems Constitutional: Negative for fever. Cardiovascular: Negative for chest pain. Respiratory: Negative for shortness of breath. Gastrointestinal: Positive for left flank pain Musculoskeletal: Negative for back pain. Skin: Negative for rash. Neurological: Negative for headaches, focal weakness or  numbness.  All systems negative/normal/unremarkable except as stated in the HPI  ____________________________________________   PHYSICAL EXAM:  VITAL SIGNS: ED Triage Vitals [10/10/19 1152]  Enc Vitals Group     BP (!) 146/109     Pulse Rate (!) 48     Resp 19     Temp 98.1 F (36.7 C)     Temp src      SpO2 93 %     Weight 170 lb (77.1 kg)     Height 5\' 7"  (1.702 m)     Head Circumference      Peak Flow      Pain Score 10     Pain Loc      Pain Edu?      Excl. in [place]?    Constitutional: Alert and oriented.  Mild distress Eyes: Conjunctivae are normal. Normal extraocular movements. Cardiovascular: Normal rate, regular rhythm. No murmurs, rubs, or gallops. Respiratory: Normal respiratory effort without tachypnea nor retractions. Breath sounds are clear and equal bilaterally. No wheezes/rales/rhonchi. Gastrointestinal: Left  flank tenderness, no rebound or guarding.  Normal bowel sounds. Musculoskeletal: Nontender with normal range of motion in extremities. No lower extremity tenderness nor edema. Neurologic:  Normal speech and language. No gross focal neurologic deficits are appreciated.  Skin:  Skin is warm, dry and intact. No rash noted. Psychiatric: Mood and affect are normal. Speech and behavior are normal.  ____________________________________________  EKG: Interpreted by me.  Atrial fibrillation with rapid ventricular response, rate is 118 bpm, PVCs, normal axis, normal QT  ____________________________________________  ED COURSE:  As part of my medical decision making, I reviewed the following data within the [place] History obtained from family if available, nursing notes, old chart and ekg, as well as notes from prior ED visits. Patient presented for flank pain, we will assess with labs and imaging as indicated at this time.   Procedures  KENYON ESHLEMAN was evaluated in Emergency Department on 10/10/2019 for the symptoms described in the  history of present illness. She was evaluated in the context of the global COVID-19 pandemic, which necessitated consideration that the patient might be at risk for infection with the SARS-CoV-2 virus that causes COVID-19. Institutional protocols and algorithms that pertain to the evaluation of patients at risk for COVID-19 are in a state of rapid change based on information released by regulatory bodies including the CDC and federal and state organizations. These policies and algorithms were followed during the patient's care in the ED.  ____________________________________________   LABS (pertinent positives/negatives)  Labs Reviewed  COMPREHENSIVE METABOLIC PANEL - Abnormal; Notable for the following components:      Result Value   Glucose, Bld 114 (*)    BUN 30 (*)    Creatinine, Ser 1.16 (*)    Total Protein 6.4 (*)    Albumin 3.3 (*)    Total Bilirubin 1.4 (*)    GFR calc non Af Amer 45 (*)    GFR calc Af Amer 52 (*)    All other components within normal limits  CBC - Abnormal; Notable for the following components:   WBC 12.8 (*)    MCV 101.4 (*)    MCH 34.5 (*)    All other components within normal limits  LIPASE, BLOOD  URINALYSIS, COMPLETE (UACMP) WITH MICROSCOPIC    RADIOLOGY Images were viewed by me CT renal protocol IMPRESSION: 1. No acute findings or explanation for the patient's symptoms. 2. Interval bilateral renal cortical thinning and atrophy. 3. Non acute osseous findings in the spine, pelvis and right femoral head as described. No acute osseous findings. 4. Chronic biliary dilatation post cholecystectomy, likely physiologic. 5. Aortic Atherosclerosis (ICD10-I70.0).  ____________________________________________   DIFFERENTIAL DIAGNOSIS   Renal colic, UTI, pyelonephritis, muscle strain, radicular back pain, shingles, dissection  FINAL ASSESSMENT AND PLAN  Flank pain   Plan: The patient had presented for left-sided flank pain. Patient's labs were  overall reassuring. Patient's imaging did not reveal any acute process.  This is likely musculoskeletal in origin.  She appears cleared for outpatient follow-up.   Laurence Aly, MD    Note: This note was generated in part or whole with voice recognition software. Voice recognition is usually quite accurate but there are transcription errors that can and very often do occur. I apologize for any typographical errors that were not detected and corrected.     Earleen Newport, MD 10/10/19 [phone]

## 2019-10-10 NOTE — ED Notes (Signed)
Unable to have pt sign d/c due to  [place] not working. PT verbalizes understanding of d/c instructions and follow up, denies further question. Paperwork in hand.

## 2019-10-12 ENCOUNTER — Other Ambulatory Visit: Payer: Self-pay | Admitting: Cardiovascular Disease

## 2019-10-18 NOTE — Telephone Encounter (Signed)
Left voicemail message that Dr. Rockey Situ wanted me to follow up to see if she received her oxygen and if she would like pulmonary referral with instructions to call back.

## 2019-10-25 NOTE — Telephone Encounter (Signed)
Spoke with patient and reviewed that we had been trying to return her call. She states that a man came over and fixed it so she now has oxygen and it is fixed. She also wanted to confirm her appointment on Monday to make sure it is with Dr. Rockey Situ because if not then she would not be coming. Confirmed her appointment date, time, location, and provider. I did inquire to see if she had seen a pulmonary provider and she reports not at this time and she said that as long as she has oxygen she doesn't need to see them. Since she is coming in on Monday encouraged her to discuss with Dr. Rockey Situ. She verbalized understanding of our conversation, appointment, and had no further questions at this time.

## 2019-10-27 NOTE — Progress Notes (Signed)
Cardiology Office Note  Date:  10/29/2019   ID:  [name], DOB [birth year], MRN [id number]  PCP:  Lemmie Evens, MD   Chief Complaint  Patient presents with  . office visit    2 month F/U-Patient reports LE edema; Meds verbally reviewed with patient.    HPI:  [name] is a  79 year old woman with a history of  smoking who continues to smoke one pack per day,  CAD, PCI to her LAD in 2004,  stress testJuly 2013 showing no ischemia, ejection fraction 77%,  mild carotid arterial disease,  significant DJD in her neck with history of fusion at C6-T1  paroxysmal atrial Fibrillation, September  2014, cardioversion November 2014 CRI, followed in [place]  reports that she is DNR/DNI, she has told this to family, has papers in place who presents for routine followup of her coronary artery disease And leg swelling.  On today's visit, not reactive, uses a walker Weight is down from 190, currently 180  On prior office visit we changed Lasix to torsemide She is taking torsemide 40 daily, renal function stable Occasionally takes 3 of torsemide a day for total of 60  Legs have been more swollen recently worse on the left than the right  CBC reviewed, hematocrit 43 Creatinine 1.16 BUN 30  BP at home 130/75, 90 - 106 pulse Heart rate up to 130 on today's visit On prior office visit heart rate was also elevated She is on metoprolol succinate 25 daily  Nausea at night, 10 at night Etiology unclear, she is requesting nausea medication  Was at the lawyer office today, fell bruised her left knee, arm Could not get off the toilet, told it was too low  EKG personally reviewed by myself on todays visit Shows atrial fibrillation rate 130 bpm no significant ST-T wave changes  Prior MRI  showing moderate to severe spinal stenosis, severe foraminal stenosis bilaterally, vertebral compression fractures  EKG personally reviewed by myself on todays visit Shows narrow complex  tachycardia concerning for atrial fibrillation rate 134 bpm  Other past medical history reviewed Off plavix and asa, pradaxa : "[facility] stopped it" creatinine 2.36 in July 2019 CR 1.6 in dec 2019   fall late 2019  Echocardiogram ejection fraction 65-70 percent  Previous workup for lower extremity vascular disease   showing moderate nonobstructive disease, medical management recommended    PMH:   has a past medical history of Anemia, CAD (coronary artery disease), Carotid artery disease ([place]), Cervical disc disease, CKD (chronic kidney disease), stage III, Closed fracture of multiple pubic rami ([place]) (06/24/2018), COPD (chronic obstructive pulmonary disease) ([place]), Diastolic dysfunction, Dysphagia, GERD (gastroesophageal reflux disease), History of kidney stones, Hypertension, Hypertriglyceridemia, Hypothyroidism, Myocardial infarction ([place]) (2003), PAD (peripheral artery disease) ([place]), PAF (paroxysmal atrial fibrillation) ([place]), PONV (postoperative nausea and vomiting), and Right renal artery stenosis ([place]).  PSH:    Past Surgical History:  Procedure Laterality Date  . ABDOMINAL HYSTERECTOMY    . AMPUTATION Right 04/12/2014   Procedure: Revision AMPUTATION Right Long DIGIT;  Surgeon: Leanora Cover, MD;  Location: [facility];  Service: Orthopedics;  Laterality: Right;  . APPLICATION OF WOUND VAC Left 01/06/2018   Procedure: APPLICATION OF WOUND VAC;  Surgeon: Samara Deist, DPM;  Location: [facility];  Service: Podiatry;  Laterality: Left;  . BALLOON DILATION  05/05/2011   Procedure: BALLOON DILATION;  Surgeon: Rogene Houston, MD;  [facility];  Service: Endoscopy;  Laterality: N/A;  . CARDIAC CATHETERIZATION  with stent 2004  CYPHER 2.75 x 90mm coronary stent in the mid left anterior descending stenotic lesion    Last stress test showed EF of 775  . CARDIOVERSION    . CATARACT EXTRACTION Left 2002  . CATARACT EXTRACTION W/PHACO Right 05/14/2013   Procedure: CATARACT  EXTRACTION PHACO AND INTRAOCULAR LENS PLACEMENT RIGHT EYE;  Surgeon: Tonny Branch, MD;  [facility];  Service: Ophthalmology;  Laterality: Right;  CDE:  20.45  . CERVICAL DISC SURGERY    . CHOLECYSTECTOMY    . CORONARY ANGIOPLASTY     1 stent  . ESOPHAGEAL DILATION N/A 04/25/2015   Procedure: ESOPHAGEAL DILATION;  Surgeon: Rogene Houston, MD;  [facility];  Service: Endoscopy;  Laterality: N/A;  . ESOPHAGOGASTRODUODENOSCOPY N/A 04/25/2015   Procedure: ESOPHAGOGASTRODUODENOSCOPY (EGD);  Surgeon: Rogene Houston, MD;  [facility];  Service: Endoscopy;  Laterality: N/A;  1225  . IR SACROPLASTY BILATERAL  06/13/2018  . IRRIGATION AND DEBRIDEMENT FOOT Left 01/06/2018   Procedure: IRRIGATION AND DEBRIDEMENT FOOT;  Surgeon: Samara Deist, DPM;  Location: [facility];  Service: Podiatry;  Laterality: Left;  Marland Kitchen MALONEY DILATION  05/05/2011   Procedure: MALONEY DILATION;  Surgeon: Rogene Houston, MD;  [facility];  Service: Endoscopy;  Laterality: N/A;  . right knee arthroscopy  2010  . SAVORY DILATION  05/05/2011   Procedure: SAVORY DILATION;  Surgeon: Rogene Houston, MD;  [facility];  Service: Endoscopy;  Laterality: N/A;  . SHOULDER SURGERY Right    torn rotator cuff  . TOENAIL EXCISION    . TOTAL ABDOMINAL HYSTERECTOMY W/ BILATERAL SALPINGOOPHORECTOMY      Current Outpatient Medications  Medication Sig Dispense Refill  . acetaminophen (TYLENOL) 500 MG tablet Take 500-1,000 mg by mouth 2 (two) times daily as needed for headache (pain). Lunch and bedtime    . albuterol (PROVENTIL HFA;VENTOLIN HFA) 108 (90 Base) MCG/ACT inhaler Inhale 2 puffs into the lungs every 4 (four) hours as needed for wheezing or shortness of breath.    Marland Kitchen albuterol (PROVENTIL) (2.5 MG/3ML) 0.083% nebulizer solution Take 2.5 mg by nebulization every 6 (six) hours as needed for shortness of breath.     . Ascorbic Acid (VITAMIN C) 1000 MG tablet Take 1,000 mg by mouth daily.     . bisacodyl (DULCOLAX) 10 MG suppository Place 1 suppository (10 mg total) rectally daily as needed for moderate constipation. 12 suppository 0  . calcitRIOL (ROCALTROL) 0.5 MCG capsule Take 0.5 mcg by mouth every other day.     . cetirizine (ZYRTEC) 10 MG tablet Take 10 mg by mouth daily.     . clonazePAM (KLONOPIN) 1 MG tablet Take 2-3 mg by mouth at bedtime.     . diphenhydrAMINE (BENADRYL) 25 mg capsule Take 1 capsule (25 mg total) by mouth every 4 (four) hours as needed for itching. 30 capsule 0  . Fluticasone-Salmeterol (ADVAIR) 500-50 MCG/DOSE AEPB Inhale 1 puff into the lungs 2 (two) times daily.    . isosorbide mononitrate (IMDUR) 60 MG 24 hr tablet TAKE ONE TABLET (60MG  TOTAL) BY MOUTH TWO TIMES DAILY 180 tablet 0  . levothyroxine (SYNTHROID) 100 MCG tablet Take 100 mcg by mouth daily.    . metoprolol succinate (TOPROL-XL) 25 MG 24 hr tablet Take 1 tablet (25 mg total) by mouth daily. 90 tablet 2  . nitroGLYCERIN (NITROLINGUAL) 0.4 MG/SPRAY spray PLACE ONE SPRAY UNDER THE TONGUE EVERY 5MINUTES FOR 3 DOSES AS NEEDEDFOR CHEST PAIN. 12 g  3  . omega-3 acid ethyl esters (LOVAZA) 1 G capsule Take 2 g by mouth 2 (two) times daily.      . ondansetron (ZOFRAN ODT) 4 MG disintegrating tablet Take 1 tablet (4 mg total) by mouth every 8 (eight) hours as needed for nausea or vomiting. 20 tablet 0  . oxyCODONE-acetaminophen (PERCOCET) 5-325 MG tablet Take 1 tablet by mouth 2 (two) times daily as needed. 20 tablet 0  . torsemide (DEMADEX) 20 MG tablet Take 2 tabs (40 mg) twice daily for wt > 185 lbs, take 1 tab (20 mg) twice daily for wt 180-185 lbs, take 1 tab (20 mg) once daily for weight < 180 lbs 180 tablet 1  . vitamin B-12 (CYANOCOBALAMIN) 1000 MCG tablet Take 1,000 mcg by mouth daily.      No current facility-administered medications for this visit.     Allergies:   Ciprofloxacin, Penicillins, Amlodipine, Morphine and related, Cefaclor, Codeine, Lipitor [atorvastatin], Prednisone, and Sulfa  antibiotics   Social History:  The patient  reports that she has been smoking cigarettes. She has a 56.00 pack-year smoking history. She has never used smokeless tobacco. She reports previous alcohol use of about 1.0 standard drinks of alcohol per week. She reports that she does not use drugs.   Family History:   family history includes Diabetes in her mother; Heart attack in her father; Heart disease in her brother and sister; Heart failure in her mother; Hypertension in her sister; Pancreatic cancer in her sister; Stroke in her mother.    Review of Systems: Review of Systems  Constitutional: Negative.   HENT: Negative.   Respiratory: Positive for shortness of breath.   Cardiovascular: Positive for leg swelling.  Gastrointestinal: Negative.   Musculoskeletal: Positive for back pain.  Neurological: Negative.   Psychiatric/Behavioral: Negative.   All other systems reviewed and are negative.   PHYSICAL EXAM: VS:  BP 138/78 (BP Location: Right Arm, Patient Position: Sitting, Cuff Size: Normal)   Pulse (!) 119   Ht 5\' 7"  (1.702 m)   Wt 180 lb (81.6 kg)   SpO2 91%   BMI 28.19 kg/m  , BMI Body mass index is 28.19 kg/m. Constitutional:  oriented to person, place, and time. No distress.  Presents in a wheelchair HENT:  Head: Grossly normal Eyes:  no discharge. No scleral icterus.  Neck: No JVD, no carotid bruits  Cardiovascular: Irregularly irregular, no murmurs appreciated 2+ pitting lower extremity edema to the thighs Pulmonary/Chest: Poor breath sounds, wheezing Abdominal: Soft.  no distension.  no tenderness.  Musculoskeletal: Normal range of motion Neurological:  normal muscle tone. Coordination normal. No atrophy Skin: Skin warm and dry Psychiatric: normal affect, pleasant   Recent Labs: 10/10/2019: ALT 19; BUN 30; Creatinine, Ser 1.16; Hemoglobin 14.7; Platelets 164; Potassium 3.6; Sodium 143   Lipid Panel No results found for: CHOL, HDL, LDLCALC, TRIG    Wt Readings  from Last 3 Encounters:  10/29/19 180 lb (81.6 kg)  10/10/19 170 lb (77.1 kg)  06/26/19 193 lb (87.5 kg)      ASSESSMENT AND PLAN:  Paroxysmal atrial fibrillation ([place]) - Plan: EKG 12-Lead Rate continues to run fast Recommend metoprolol succinate 50 daily She will monitor heart rate at home and call our office, may need 75 daily We will try to avoid digoxin given prior renal dysfunction, avoid calcium channel blockers given leg swelling  Essential hypertension - She did not tolerate bisoprolol Increase metoprolol as above for rate control  Chronic back pain Spinal stenosis,  foraminal stenosis, vertebral fractures Unable to tolerate pain medication  Coronary artery disease involving native coronary artery of native heart without angina pectoris -  Currently with no symptoms of angina. No further workup at this time. Continue current medication regimen.  Acute on chronic renal failure Renal function improved compared to 2019 On torsemide 40 daily, occasional extra torsemide in the afternoon Followed in [place] Reports she will have follow-up lab work there  Acute on chronic diastolic CHF Weight down,  Continue torsemide 40 daily Ace wraps for leg swelling  Lower extremity edema Likely component of dependent edema with diastolic CHF Continue torsemide 40 daily with Ace wraps, leg elevation  Mixed hyperlipidemia She was previously on Zetia Previous statin intolerance  PAD Lower extremity arterial stenosis, 50-75% stenosis mid right SFA She is very sedentary at baseline,  Does not want a statin  SOB (shortness of breath)  chronic bronchitis.  COPD, long smoking history Uses nebulizers Now with acute on chronic diastolic CHF REDS VEST score is 30% on today's visit  Leg pain /back pain Chronic issue, Previously seen by Dr. Arnoldo Morale    Total encounter time more than 25 minutes  Greater than 50% was spent in counseling and coordination of care with the  patient   Orders Placed This Encounter  Procedures  . EKG 12-Lead     Signed, Esmond Plants, M.D., Ph.D. 10/29/2019  [place], [place]

## 2019-10-29 ENCOUNTER — Ambulatory Visit (INDEPENDENT_AMBULATORY_CARE_PROVIDER_SITE_OTHER): Payer: Medicare HMO | Admitting: Cardiovascular Disease

## 2019-10-29 ENCOUNTER — Encounter: Payer: Self-pay | Admitting: Cardiovascular Disease

## 2019-10-29 ENCOUNTER — Other Ambulatory Visit: Payer: Self-pay

## 2019-10-29 VITALS — BP 138/78 | HR 119 | Ht 67.0 in | Wt 180.0 lb

## 2019-10-29 DIAGNOSIS — R0602 Shortness of breath: Secondary | ICD-10-CM

## 2019-10-29 DIAGNOSIS — I739 Peripheral vascular disease, unspecified: Secondary | ICD-10-CM

## 2019-10-29 DIAGNOSIS — I480 Paroxysmal atrial fibrillation: Secondary | ICD-10-CM | POA: Diagnosis not present

## 2019-10-29 DIAGNOSIS — N183 Chronic kidney disease, stage 3 unspecified: Secondary | ICD-10-CM

## 2019-10-29 DIAGNOSIS — I25118 Atherosclerotic heart disease of native coronary artery with other forms of angina pectoris: Secondary | ICD-10-CM

## 2019-10-29 DIAGNOSIS — I10 Essential (primary) hypertension: Secondary | ICD-10-CM | POA: Diagnosis not present

## 2019-10-29 DIAGNOSIS — E782 Mixed hyperlipidemia: Secondary | ICD-10-CM

## 2019-10-29 DIAGNOSIS — J432 Centrilobular emphysema: Secondary | ICD-10-CM

## 2019-10-29 MED ORDER — METOPROLOL SUCCINATE ER 50 MG PO TB24: 50.0000 mg | ORAL_TABLET | Freq: Every day | ORAL | 3 refills | Status: AC

## 2019-10-29 MED ORDER — NITROGLYCERIN 0.4 MG/SPRAY TL SOLN: 3 refills | Status: AC

## 2019-10-29 MED ORDER — ONDANSETRON 4 MG PO TBDP: 4.0000 mg | ORAL_TABLET | Freq: Three times a day (TID) | ORAL | 1 refills | Status: AC | PRN

## 2019-10-29 MED ORDER — TORSEMIDE 20 MG PO TABS: ORAL_TABLET | ORAL | 1 refills | Status: AC

## 2019-10-29 NOTE — Patient Instructions (Addendum)
Use ACE wraps on the legs for leg swelling   Medication Instructions:  Please increase the metoprolol succinate up to 50 mg daily Monitor heart rate, call if rates >100  We will refill NTG spray   Torsemide 40 daily with extra 20 in the afternoon for worsening leg swelling, weight gain, shortness of breath   If you need a refill on your cardiac medications before your next appointment, please call your pharmacy.    Lab work: No new labs needed   If you have labs (blood work) drawn today and your tests are completely normal, you will receive your results only by: Marland Kitchen MyChart Message (if you have MyChart) OR . A paper copy in the mail If you have any lab test that is abnormal or we need to change your treatment, we will call you to review the results.   Testing/Procedures: No new testing needed   Follow-Up: At [facility], you and your health needs are our priority.  As part of our continuing mission to provide you with exceptional heart care, we have created designated Provider Care Teams.  These Care Teams include your primary Cardiologist (physician) and Advanced Practice Providers (APPs -  Physician Assistants and Nurse Practitioners) who all work together to provide you with the care you need, when you need it.  . You will need a follow up appointment in 6 months   . Providers on your designated Care Team:   . Murray Hodgkins, NP . Christell Faith, PA-C . Marrianne Mood, PA-C  Any Other Special Instructions Will Be Listed Below (If Applicable).  For educational health videos Log in to : [email or web address] Or : [email or web address], password : [email or web address]

## 2019-11-28 ENCOUNTER — Encounter (INDEPENDENT_AMBULATORY_CARE_PROVIDER_SITE_OTHER): Payer: Self-pay | Admitting: Gastroenterology

## 2019-11-28 ENCOUNTER — Other Ambulatory Visit: Payer: Self-pay

## 2019-11-28 ENCOUNTER — Ambulatory Visit (INDEPENDENT_AMBULATORY_CARE_PROVIDER_SITE_OTHER): Payer: Medicare HMO | Admitting: Gastroenterology

## 2019-11-28 VITALS — BP 139/74 | HR 100 | Temp 97.0°F | Ht 67.0 in | Wt 180.6 lb

## 2019-11-28 DIAGNOSIS — K5909 Other constipation: Secondary | ICD-10-CM

## 2019-11-28 DIAGNOSIS — R1310 Dysphagia, unspecified: Secondary | ICD-10-CM | POA: Diagnosis not present

## 2019-11-28 MED ORDER — DOCUSATE SODIUM 100 MG PO CAPS: 100.0000 mg | ORAL_CAPSULE | Freq: Two times a day (BID) | ORAL | 0 refills | Status: AC

## 2019-11-28 MED ORDER — FAMOTIDINE 20 MG PO TABS
20.0000 mg | ORAL_TABLET | Freq: Two times a day (BID) | ORAL | 1 refills | Status: AC
Start: 2019-11-28 — End: ?

## 2019-11-28 NOTE — Patient Instructions (Addendum)
We are scheduling an endoscopy for evaluation  We are starting medication for acid reflux that may help the swallowing issues as well as a stool softener that she decrease straining.  Please contact us if you have any questions.  Make sure you are eating small bites and taking your time when eating.

## 2019-11-28 NOTE — Progress Notes (Signed)
Patient profile: [name] is a 79 y.o. female seen for evaluation of dysphagia. Last seen in clinic 2016.  History of Present Illness: [name] is seen today for evaluation of dysphagia.  She reports dysphagia beginning approximately 6 to 9 months ago & is becoming more frequent.  It can be to liquids, solids, or foods but seems most prominent to solids.  Substances stick in upper esophageal area.  She is having to regurgitate substances back up occurring typically 2-3 times a week.  She does have some GERD symptoms with belching.  Reports she placed on a PPI in the past but her nephrologist stopped this.  She does take Zofran 2-3 times a week for occasional nausea but denies vomiting.  She reports constipation and takes Dulcolax once a week, with Dulcolax she has bowel movement about every other day but has significant straining.  She denies any rectal bleeding or lower abdominal pain.  She has not tried a daily bowel regimen.   Smokes 1/2 ppd, denies alcohol or nsaids.   Wt Readings from Last 3 Encounters:  11/28/19 180 lb 9.6 oz (81.9 kg)  10/29/19 180 lb (81.6 kg)  10/10/19 170 lb (77.1 kg)     Last Colonoscopy: 2012-Findings:    Prep excellent.  Normal mucosa throughout.  Few small diverticula at sigmoid colon.   Normal rectal mucosa as well as no rectal junction.  Last Endoscopy: 2016 Impression: Normal esophagogastroduodenoscopy. Esophagus dilated by passing 56 French Maloney dilator but no disruption noted to esophageal mucosa.    Past Medical History:  Past Medical History:  Diagnosis Date  . Anemia   . CAD (coronary artery disease)    a. 10/2002 PCI: LAD 36m (2.75x23 Cypher DES); b. 09/2004 Cath: LM nl, LAD 40p, patent stent, D1 30p, LCX 20p, diff dzs throughout, OM1 50p, OM2 60p, OM3 40p, RCA 40p-->Med Rx; c. 10/2012 MV: No ischemia/infarct.  . Carotid artery disease ([place])   . Cervical disc disease   . CKD (chronic kidney disease), stage III   . Closed  fracture of multiple pubic rami ([place]) 06/24/2018  . COPD (chronic obstructive pulmonary disease) ([place])   . Diastolic dysfunction    a. 03/2017 Echo: Ef 60-65%, Gr2 DD, mild MR, mildly dil LA/RA, mildly to mod dil RV w/ nl fxn, PASP 37mmHg.  Marland Kitchen Dysphagia   . GERD (gastroesophageal reflux disease)   . History of kidney stones   . Hypertension    2002  . Hypertriglyceridemia   . Hypothyroidism   . Myocardial infarction ([place]) 2003  . PAD (peripheral artery disease) ([place])    a. 10/2017 ABI's R = 0.78, L = 0.88. Duplex showed borderline R CFA and SFA dzs->med rx.  Marland Kitchen PAF (paroxysmal atrial fibrillation) ([place])    a. Amio/Pradaxa (CHA2DS2VASc = 6).  Marland Kitchen PONV (postoperative nausea and vomiting)   . Right renal artery stenosis ([place])    a. 09/2017 Renal Artery Duplex: no significant LRA stenosis. RRA <60%.    Problem List: Patient Active Problem List   Diagnosis Date Noted  . Chronic respiratory failure with hypoxia ([place])   . Fall   . Closed fracture of multiple pubic rami ([place]) 06/25/2018  . ARF (acute renal failure) ([place]) 01/02/2018  . AKI (acute kidney injury) ([place]) 12/19/2017  . COPD (chronic obstructive pulmonary disease) ([place]) 07/26/2017  . Chronic renal impairment, stage 3 (moderate) 07/19/2016  . Back pain 08/01/2015  . Bradycardia 08/01/2015  . Hypothyroidism 01/27/2015  . HTN (hypertension) 01/27/2015  .  Neck pain 01/18/2014  . Paroxysmal atrial fibrillation ([place]) 03/26/2013  . Fatigue 03/26/2013  . SOB (shortness of breath) 03/26/2013  . Chest discomfort 03/26/2013  . CAD (coronary artery disease) 03/26/2013  . Smoker 03/26/2013  . Hyperlipidemia 03/26/2013    Past Surgical History: Past Surgical History:  Procedure Laterality Date  . ABDOMINAL HYSTERECTOMY    . AMPUTATION Right 04/12/2014   Procedure: Revision AMPUTATION Right Long DIGIT;  Surgeon: Leanora Cover, MD;  Location: [place];  Service: Orthopedics;  Laterality: Right;  . APPLICATION OF WOUND VAC Left 01/06/2018    Procedure: APPLICATION OF WOUND VAC;  Surgeon: Samara Deist, DPM;  Location: [facility];  Service: Podiatry;  Laterality: Left;  . BALLOON DILATION  05/05/2011   Procedure: BALLOON DILATION;  Surgeon: Rogene Houston, MD;  [facility];  Service: Endoscopy;  Laterality: N/A;  . CARDIAC CATHETERIZATION     with stent 2004  CYPHER 2.75 x 35mm coronary stent in the mid left anterior descending stenotic lesion    Last stress test showed EF of 775  . CARDIOVERSION    . CATARACT EXTRACTION Left 2002  . CATARACT EXTRACTION W/PHACO Right 05/14/2013   Procedure: CATARACT EXTRACTION PHACO AND INTRAOCULAR LENS PLACEMENT RIGHT EYE;  Surgeon: Tonny Branch, MD;  Location: [facility];  Service: Ophthalmology;  Laterality: Right;  CDE:  20.45  . CERVICAL DISC SURGERY    . CHOLECYSTECTOMY    . CORONARY ANGIOPLASTY     1 stent  . ESOPHAGEAL DILATION N/A 04/25/2015   Procedure: ESOPHAGEAL DILATION;  Surgeon: Rogene Houston, MD;  [facility];  Service: Endoscopy;  Laterality: N/A;  . ESOPHAGOGASTRODUODENOSCOPY N/A 04/25/2015   Procedure: ESOPHAGOGASTRODUODENOSCOPY (EGD);  Surgeon: Rogene Houston, MD;  [facility];  Service: Endoscopy;  Laterality: N/A;  1225  . IR SACROPLASTY BILATERAL  06/13/2018  . IRRIGATION AND DEBRIDEMENT FOOT Left 01/06/2018   Procedure: IRRIGATION AND DEBRIDEMENT FOOT;  Surgeon: Samara Deist, DPM;  Location: [facility];  Service: Podiatry;  Laterality: Left;  Marland Kitchen MALONEY DILATION  05/05/2011   Procedure: MALONEY DILATION;  Surgeon: Rogene Houston, MD;  [facility];  Service: Endoscopy;  Laterality: N/A;  . right knee arthroscopy  2010  . SAVORY DILATION  05/05/2011   Procedure: SAVORY DILATION;  Surgeon: Rogene Houston, MD;  [facility];  Service: Endoscopy;  Laterality: N/A;  . SHOULDER SURGERY Right    torn rotator cuff  . TOENAIL EXCISION    . TOTAL ABDOMINAL HYSTERECTOMY W/ BILATERAL SALPINGOOPHORECTOMY       Allergies: Allergies  Allergen Reactions  . Ciprofloxacin Anaphylaxis  . Penicillins Anaphylaxis    Near death experience,  DID THE REACTION INVOLVE: Swelling of the face/tongue/throat, SOB, or low BP? Yes Sudden or severe rash/hives, skin peeling, or the inside of the mouth or nose? No Did it require medical treatment? Yes When did it last happen?age - 20's or 30's If all above answers are "NO", may proceed with cephalosporin use.  . Amlodipine Swelling    Leg swelling  . Morphine And Related Hives and Itching       . Cefaclor Nausea And Vomiting and Other (See Comments)  . Codeine Nausea Only and Other (See Comments)    Cramps  . Lipitor [Atorvastatin] Other (See Comments)    Myalgias   . Prednisone Palpitations  . Sulfa Antibiotics Rash           Home Medications:  Current Outpatient Medications:  .  acetaminophen (TYLENOL) 500 MG tablet, Take 500-1,000 mg by mouth 2 (two) times daily as needed for headache (pain). Lunch and bedtime, Disp: , Rfl:  .  albuterol (PROVENTIL HFA;VENTOLIN HFA) 108 (90 Base) MCG/ACT inhaler, Inhale 2 puffs into the lungs every 4 (four) hours as needed for wheezing or shortness of breath., Disp: , Rfl:  .  albuterol (PROVENTIL) (2.5 MG/3ML) 0.083% nebulizer solution, Take 2.5 mg by nebulization every 6 (six) hours as needed for shortness of breath. , Disp: , Rfl:  .  Ascorbic Acid (VITAMIN C) 1000 MG tablet, Take 1,000 mg by mouth daily., Disp: , Rfl:  .  calcitRIOL (ROCALTROL) 0.5 MCG capsule, Take 0.5 mcg by mouth every other day. , Disp: , Rfl:  .  cetirizine (ZYRTEC) 10 MG tablet, Take 10 mg by mouth daily. , Disp: , Rfl:  .  clonazePAM (KLONOPIN) 1 MG tablet, Take 2-3 mg by mouth at bedtime. , Disp: , Rfl:  .  diphenhydrAMINE (BENADRYL) 25 mg capsule, Take 1 capsule (25 mg total) by mouth every 4 (four) hours as needed for itching., Disp: 30 capsule, Rfl: 0 .  Fluticasone-Salmeterol (ADVAIR) 500-50 MCG/DOSE AEPB, Inhale 1 puff into the  lungs 2 (two) times daily., Disp: , Rfl:  .  isosorbide mononitrate (IMDUR) 60 MG 24 hr tablet, TAKE ONE TABLET (60MG  TOTAL) BY MOUTH TWO TIMES DAILY, Disp: 180 tablet, Rfl: 0 .  levothyroxine (SYNTHROID) 100 MCG tablet, Take 100 mcg by mouth daily., Disp: , Rfl:  .  metoprolol succinate (TOPROL-XL) 50 MG 24 hr tablet, Take 1 tablet (50 mg total) by mouth daily. Take with or immediately following a meal., Disp: 90 tablet, Rfl: 3 .  nitroGLYCERIN (NITROLINGUAL) 0.4 MG/SPRAY spray, PLACE ONE SPRAY UNDER THE TONGUE EVERY 5MINUTES FOR 3 DOSES AS NEEDEDFOR CHEST PAIN., Disp: 12 g, Rfl: 3 .  omega-3 acid ethyl esters (LOVAZA) 1 G capsule, Take 2 g by mouth 2 (two) times daily. Patient states that she takes 2 in the morning and 2 at night, Disp: , Rfl:  .  ondansetron (ZOFRAN ODT) 4 MG disintegrating tablet, Take 1 tablet (4 mg total) by mouth every 8 (eight) hours as needed for nausea or vomiting., Disp: 20 tablet, Rfl: 1 .  torsemide (DEMADEX) 20 MG tablet, Take 2 tabs (40 mg) twice daily for wt > 185 lbs, take 1 tab (20 mg) twice daily for wt 180-185 lbs, take 1 tab (20 mg) once daily for weight < 180 lbs, Disp: 180 tablet, Rfl: 1 .  vitamin B-12 (CYANOCOBALAMIN) 1000 MCG tablet, Take 1,000 mcg by mouth daily. , Disp: , Rfl:  .  bisacodyl (DULCOLAX) 10 MG suppository, Place 1 suppository (10 mg total) rectally daily as needed for moderate constipation. (Patient not taking: Reported on 11/28/2019), Disp: 12 suppository, Rfl: 0 .  docusate sodium (COLACE) 100 MG capsule, Take 1 capsule (100 mg total) by mouth 2 (two) times daily., Disp: 60 capsule, Rfl: 0 .  famotidine (PEPCID) 20 MG tablet, Take 1 tablet (20 mg total) by mouth 2 (two) times daily., Disp: 60 tablet, Rfl: 1   Family History: family history includes Diabetes in her mother; Heart attack in her father; Heart disease in her brother and sister; Heart failure in her mother; Hypertension in her sister; Pancreatic cancer in her sister; Stroke in her  mother.    Social History:   reports that she has been smoking cigarettes. She has a 56.00 pack-year smoking history. She has never used smokeless tobacco. She reports previous  alcohol use of about 1.0 standard drinks of alcohol per week. She reports that she does not use drugs.   Review of Systems: Constitutional: Denies weight loss/weight gain  Eyes: No changes in vision. ENT: No oral lesions, sore throat.  GI: see HPI.  Heme/Lymph: No easy bruising.  CV: No chest pain.  GU: No hematuria.  Integumentary: No rashes.  Neuro: No headaches.  Psych: No depression/anxiety.  Endocrine: No heat/cold intolerance.  Allergic/Immunologic: No urticaria.  Resp: + cough, SOB.  Musculoskeletal: + joint swelling.    Physical Examination: BP 139/74 (BP Location: Right Arm, Patient Position: Sitting, Cuff Size: Large)   Pulse 100   Temp (!) 97 F (36.1 C) (Temporal)   Ht 5\' 7"  (1.702 m)   Wt 180 lb 9.6 oz (81.9 kg)   BMI 28.29 kg/m  Gen: NAD, alert and oriented x 4, ambulates w/ walker HEENT: PEERLA, EOMI, Neck: supple, no JVD Chest: CTA bilaterally,+ wheezing bilaterally, crackles, or other adventitious sounds CV: RRR, no m/g/c/r Abd: soft, NT, ND, +BS in all four quadrants; no HSM, guarding, ridigity, or rebound tenderness Ext: bilateral lower extremities wrapped in dressing Skin: no rash or lesions noted on observed skin Lymph: no noted LAD  Data Reviewed:  10/10/18 labs--- CMP with creatinine 1.16, albumin 3.3, T bili 1.4, GFR 45, CBC with white count 12.8, MCV 101, otherwise normal   Assessment/Plan: Ms. Leatherwood is a 79 y.o. female seen today for dysphagia  1.  Dysphagia-patient reports improved after her empiric dilation 2016.  Given dysphagia also includes liquid may have component of dysmotility but will schedule endoscopy with possible dilation for evaluation.  We reviewed small bites etc. in interim.  She been on a PPI but stopped over concern for possible contribution to CKD by  nephro, will try adding Pepcid twice daily to her regimen.  2.  Constipation-despite Dulcolax once a week.  We discussed trying a daily stool softener and getting adequate fluid intake.  Constipation is chronic, she is due for colonoscopy next year.  She denies prior issues with sedation.  She reports she has O2 at home but has not needed recently-to notify me if breathing worsens prior to procedure. Will plan for propofol  Patient denies CP, SOB, and use of blood thinners. I discussed the risks and benefits of procedure including bleeding, perforation, infection, missed lesions, medication reactions and possible hospitalization or surgery if complications. All questions answered.    Jeyda was seen today for new patient (initial visit).  Diagnoses and all orders for this visit:  Dysphagia, unspecified type  Other orders -     famotidine (PEPCID) 20 MG tablet; Take 1 tablet (20 mg total) by mouth 2 (two) times daily. -     docusate sodium (COLACE) 100 MG capsule; Take 1 capsule (100 mg total) by mouth 2 (two) times daily.        I personally performed the service, non-incident to. (WP)  Laurine Blazer, [facility]

## 2019-11-29 ENCOUNTER — Encounter (INDEPENDENT_AMBULATORY_CARE_PROVIDER_SITE_OTHER): Payer: Self-pay | Admitting: *Deleted

## 2019-11-29 ENCOUNTER — Other Ambulatory Visit (INDEPENDENT_AMBULATORY_CARE_PROVIDER_SITE_OTHER): Payer: Self-pay | Admitting: *Deleted

## 2019-12-06 ENCOUNTER — Other Ambulatory Visit (INDEPENDENT_AMBULATORY_CARE_PROVIDER_SITE_OTHER): Payer: Self-pay | Admitting: *Deleted

## 2019-12-10 ENCOUNTER — Telehealth: Payer: Self-pay | Admitting: Cardiovascular Disease

## 2019-12-10 NOTE — Telephone Encounter (Signed)
Pt c/o swelling: STAT is pt has developed SOB within 24 hours  1) How much weight have you gained and in what time span?   172 last week hasnt weighed this week   2) If swelling, where is the swelling located? All over   3) Are you currently taking a fluid pill? Yes   4) Are you currently SOB? Chronic but increased   5) Do you have a log of your daily weights (if so, list)? Yes   6) Have you gained 3 pounds in a day or 5 pounds in a week? Not sure   7) Have you traveled recently? Not sure

## 2019-12-10 NOTE — Telephone Encounter (Signed)
Returned the patients call. [email or web address]. 

## 2019-12-12 NOTE — Telephone Encounter (Signed)
No answer on home or mobile number. Left message for patient to call back if still having issues or symptoms and needs to discuss them further.

## 2019-12-13 NOTE — Telephone Encounter (Signed)
Patient daughter in law returning call  Is not with patient but states we should try to contact patient again at [phone]

## 2019-12-14 NOTE — Telephone Encounter (Signed)
Left voicemail message to call back  

## 2019-12-19 ENCOUNTER — Encounter: Payer: Self-pay | Admitting: *Deleted

## 2019-12-19 NOTE — Telephone Encounter (Signed)
Letter printed and sent in the mail.

## 2019-12-19 NOTE — Telephone Encounter (Signed)
No answer/No voicemail. Multiple attempts to reach patient and no return call. Will mail letter to patient and close encounter.

## 2019-12-23 IMAGING — XA Imaging study
5 series · 5 of 5 positions shown · non-contrast
Comparison: none

CLINICAL DATA: Low back pain.  RIGHT leg pain.  Tailbone pain.

[Series 1: ortho standard · 1 of 1 slices shown (1 of 5)]
[im 1/1]
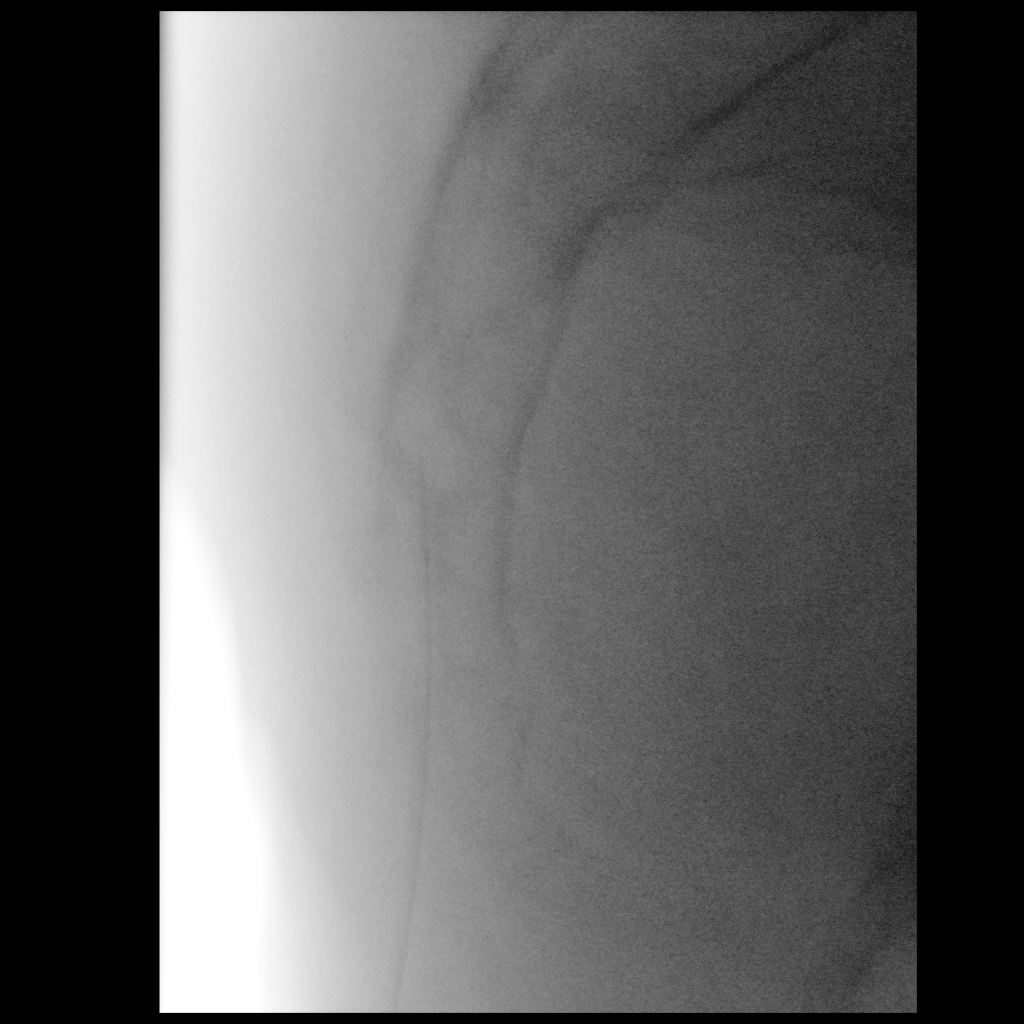

[Series 2: ortho standard · 1 of 1 slices shown (2 of 5)]
[im 1/1]
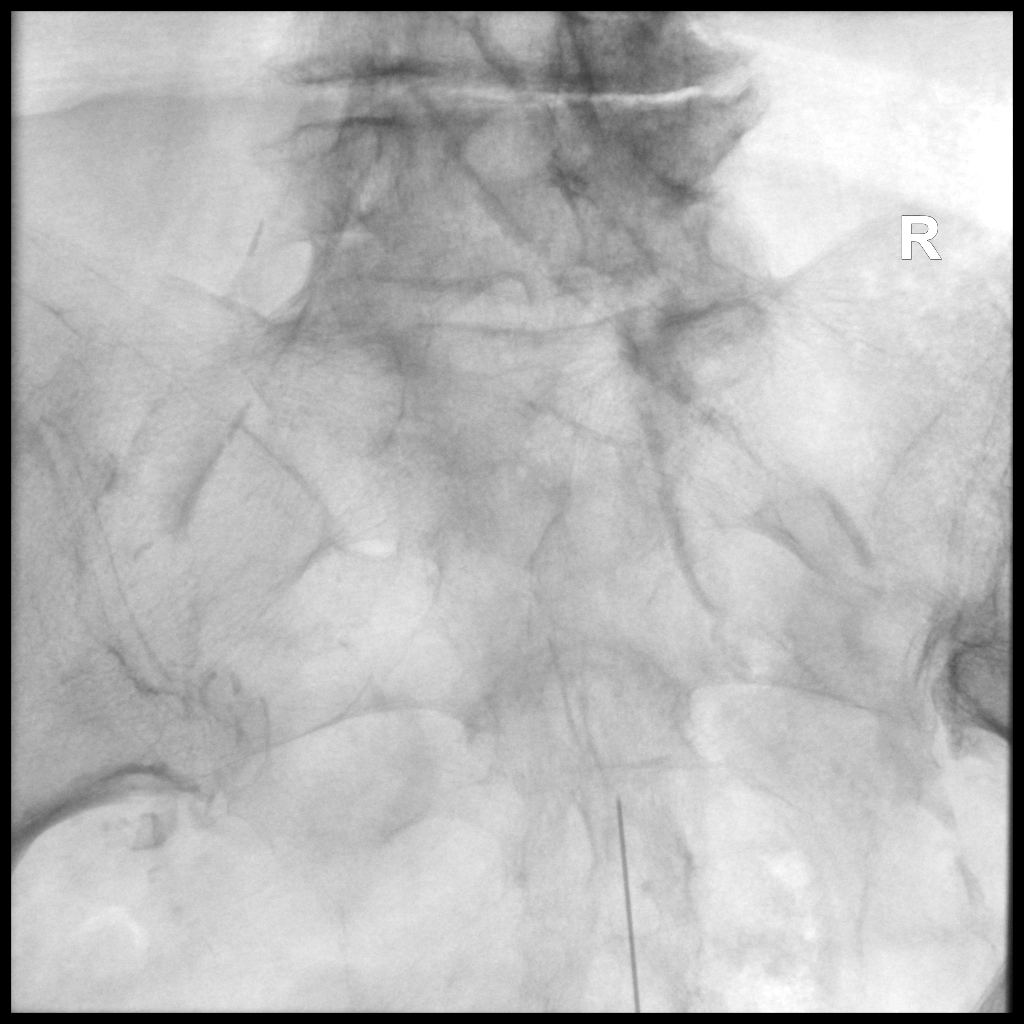

[Series 3: ortho standard · 1 of 1 slices shown (3 of 5)]
[im 1/1]
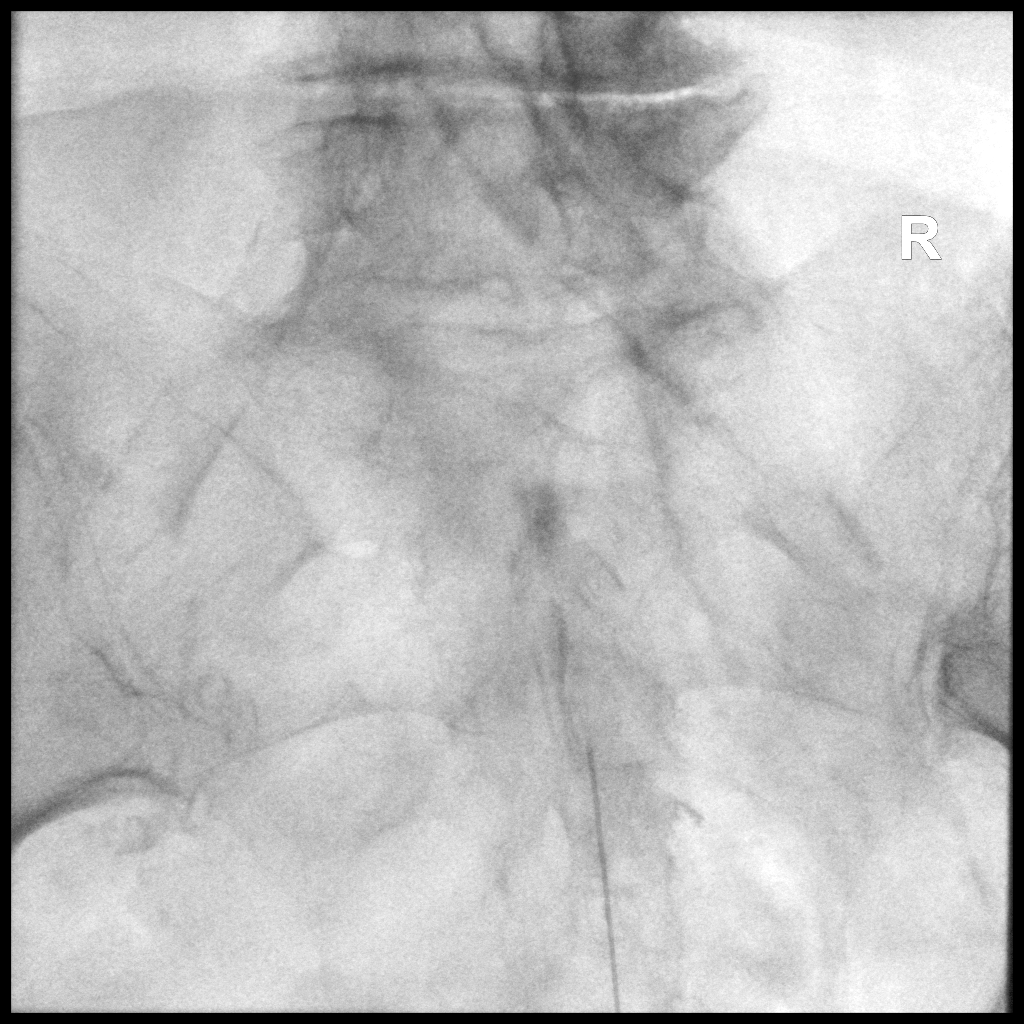

[Series 4: ortho standard · 1 of 1 slices shown (4 of 5)]
[im 1/1]
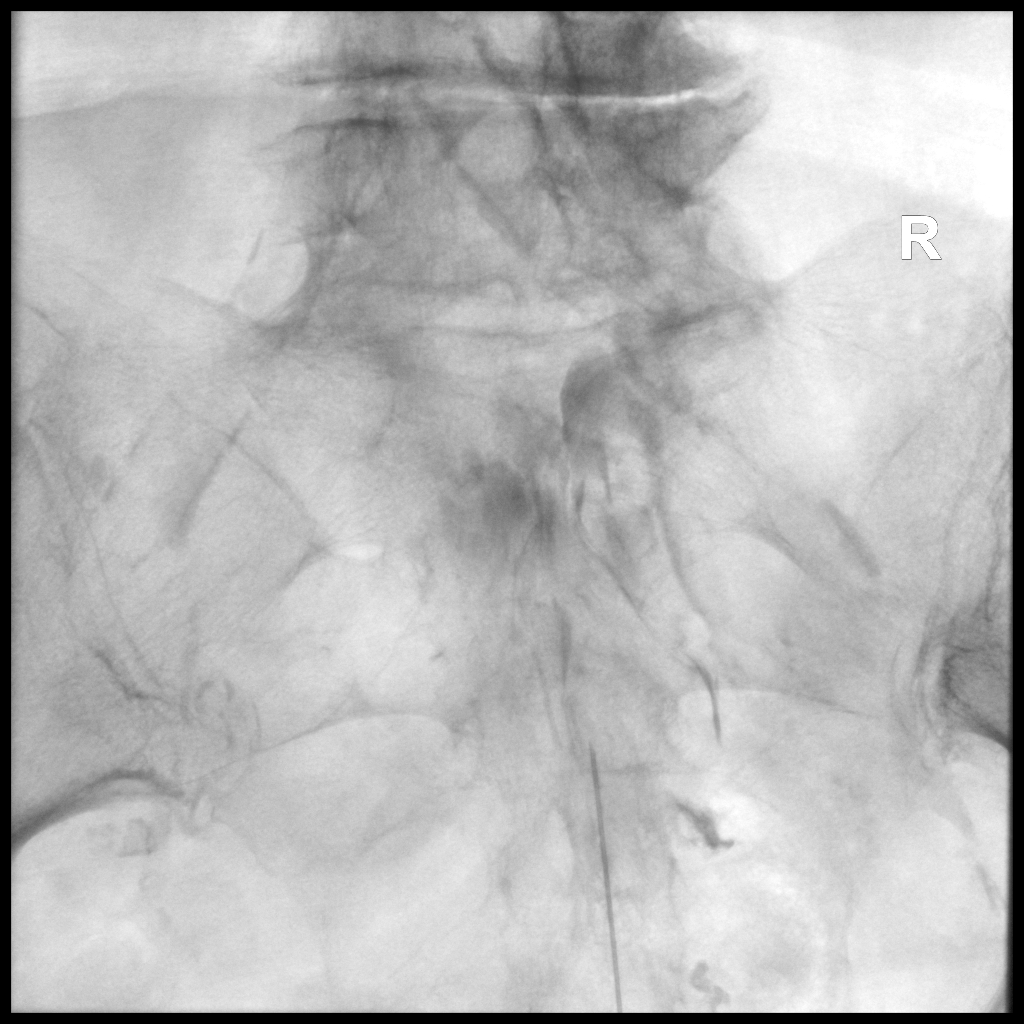

[Series 5: ortho standard · 1 of 1 slices shown (5 of 5)]
[im 1/1]
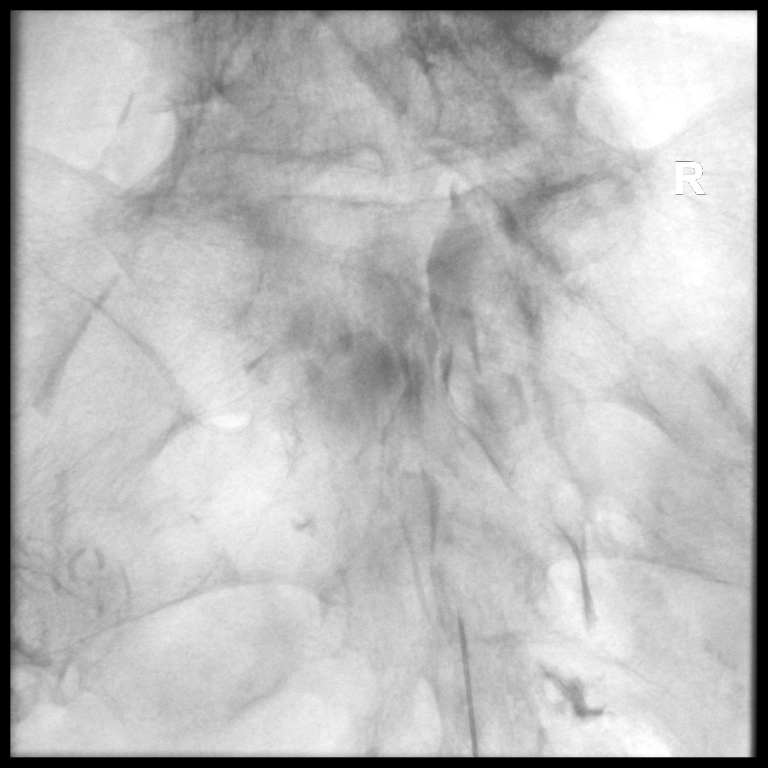

[5 of 5 positions shown; findings below may reference images not displayed]

FLUOROSCOPY TIME:  18 seconds corresponding to a Dose Area Product
of 75.05 Gy*m2

EXAM:
CAUDAL EPIDURAL INJECTION

Informed written consent was obtained.  Time-out was performed.

An appropriate skin entry site was chosen, cleansed with Betadine,
and anesthetized with 1% lidocaine.

Utilizing a caudal approach, the skin overlying the sacral hiatus
was cleansed and anesthetized. A 22 gauge epidural needle was
advanced into the sacral epidural space. Injection of Isovue-M 200
shows a good epidural pattern with spread up to L5-S1. No vascular
opacification is seen.

120 mg of Depo-Medrol mixed with 3 ml of normal saline and 3 ml of
1% Lidocaine were instilled. The procedure was well-tolerated, and
the patient was discharged thirty minutes following the injection in
good condition.
IMPRESSION: Technically successful caudal epidural injection #1.

## 2019-12-24 NOTE — Patient Instructions (Signed)
GIZZELLE LACOMB  12/24/2019     @PREFPERIOPPHARMACY @   Your procedure is scheduled on  12/28/2019 .  Report to [facility] at  0930 A.M.  Call this number if you have problems the morning of surgery:  [phone]   Remember:  Follow the diet instructions given to you by Dr Olevia Perches office.                       Take these medicines the morning of surgery with A SIP OF WATER  Pepcid, isosorbide, levothyroxine, metoprolol. Use your nebulizer and your inhaler before you come.    Do not wear jewelry, make-up or nail polish.  Do not wear lotions, powders, or perfumes. Please wear deodorant and brush your teeth.  Do not shave 48 hours prior to surgery.  Men may shave face and neck.  Do not bring valuables to the hospital.  [facility] is not responsible for any belongings or valuables.  Contacts, dentures or bridgework may not be worn into surgery.  Leave your suitcase in the car.  After surgery it may be brought to your room.  For patients admitted to the hospital, discharge time will be determined by your treatment team.  Patients discharged the day of surgery will not be allowed to drive home.   Name and phone number of your driver:   family Special instructions:  DO NOT smoke the morning of your procedure.  Please read over the following fact sheets that you were given. Anesthesia Post-op Instructions and Care and Recovery After Surgery       Upper Endoscopy, Adult, Care After This sheet gives you information about how to care for yourself after your procedure. Your health care provider may also give you more specific instructions. If you have problems or questions, contact your health care provider. What can I expect after the procedure? After the procedure, it is common to have:  A sore throat.  Mild stomach pain or discomfort.  Bloating.  Nausea. Follow these instructions at home:   Follow instructions from your health care provider about what to eat  or drink after your procedure.  Return to your normal activities as told by your health care provider. Ask your health care provider what activities are safe for you.  Take over-the-counter and prescription medicines only as told by your health care provider.  Do not drive for 24 hours if you were given a sedative during your procedure.  Keep all follow-up visits as told by your health care provider. This is important. Contact a health care provider if you have:  A sore throat that lasts longer than one day.  Trouble swallowing. Get help right away if:  You vomit blood or your vomit looks like coffee grounds.  You have: ? A fever. ? Bloody, black, or tarry stools. ? A severe sore throat or you cannot swallow. ? Difficulty breathing. ? Severe pain in your chest or abdomen. Summary  After the procedure, it is common to have a sore throat, mild stomach discomfort, bloating, and nausea.  Do not drive for 24 hours if you were given a sedative during the procedure.  Follow instructions from your health care provider about what to eat or drink after your procedure.  Return to your normal activities as told by your health care provider. This information is not intended to replace advice given to you by your health care provider. Make sure you discuss  any questions you have with your health care provider. Document Revised: 12/13/2017 Document Reviewed: 11/21/2017 Elsevier Patient Education  [place].  Esophageal Dilatation Esophageal dilatation, also called esophageal dilation, is a procedure to widen or open (dilate) a blocked or narrowed part of the esophagus. The esophagus is the part of the body that moves food and liquid from the mouth to the stomach. You may need this procedure if:  You have a buildup of scar tissue in your esophagus that makes it difficult, painful, or impossible to swallow. This can be caused by gastroesophageal reflux disease (GERD).  You have  cancer of the esophagus.  There is a problem with how food moves through your esophagus. In some cases, you may need this procedure repeated at a later time to dilate the esophagus gradually. Tell a health care provider about:  Any allergies you have.  All medicines you are taking, including vitamins, herbs, eye drops, creams, and over-the-counter medicines.  Any problems you or family members have had with anesthetic medicines.  Any blood disorders you have.  Any surgeries you have had.  Any medical conditions you have.  Any antibiotic medicines you are required to take before dental procedures.  Whether you are pregnant or may be pregnant. What are the risks? Generally, this is a safe procedure. However, problems may occur, including:  Bleeding due to a tear in the lining of the esophagus.  A hole (perforation) in the esophagus. What happens before the procedure?  Follow instructions from your health care provider about eating or drinking restrictions.  Ask your health care provider about changing or stopping your regular medicines. This is especially important if you are taking diabetes medicines or blood thinners.  Plan to have someone take you home from the hospital or clinic.  Plan to have a responsible adult care for you for at least 24 hours after you leave the hospital or clinic. This is important. What happens during the procedure?  You may be given a medicine to help you relax (sedative).  A numbing medicine may be sprayed into the back of your throat, or you may gargle the medicine.  Your health care provider may perform the dilatation using various surgical instruments, such as: ? Simple dilators. This instrument is carefully placed in the esophagus to stretch it. ? Guided wire bougies. This involves using an endoscope to insert a wire into the esophagus. A dilator is passed over this wire to enlarge the esophagus. Then the wire is removed. ? Balloon  dilators. An endoscope with a small balloon at the end is inserted into the esophagus. The balloon is inflated to stretch the esophagus and open it up. The procedure may vary among health care providers and hospitals. What happens after the procedure?  Your blood pressure, heart rate, breathing rate, and blood oxygen level will be monitored until the medicines you were given have worn off.  Your throat may feel slightly sore and numb. This will improve slowly over time.  You will not be allowed to eat or drink until your throat is no longer numb.  When you are able to drink, urinate, and sit on the edge of the bed without nausea or dizziness, you may be able to return home. Follow these instructions at home:  Take over-the-counter and prescription medicines only as told by your health care provider.  Do not drive for 24 hours if you were given a sedative during your procedure.  You should have a responsible adult with  you for 24 hours after the procedure.  Follow instructions from your health care provider about any eating or drinking restrictions.  Do not use any products that contain nicotine or tobacco, such as cigarettes and e-cigarettes. If you need help quitting, ask your health care provider.  Keep all follow-up visits as told by your health care provider. This is important. Get help right away if you:  Have a fever.  Have chest pain.  Have pain that is not relieved by medication.  Have trouble breathing.  Have trouble swallowing.  Vomit blood. Summary  Esophageal dilatation, also called esophageal dilation, is a procedure to widen or open (dilate) a blocked or narrowed part of the esophagus.  Plan to have someone take you home from the hospital or clinic.  For this procedure, a numbing medicine may be sprayed into the back of your throat, or you may gargle the medicine.  Do not drive for 24 hours if you were given a sedative during your procedure. This  information is not intended to replace advice given to you by your health care provider. Make sure you discuss any questions you have with your health care provider. Document Revised: 04/18/2019 Document Reviewed: 04/26/2017 Elsevier Patient Education  2020 [place] After These instructions provide you with information about caring for yourself after your procedure. Your health care provider may also give you more specific instructions. Your treatment has been planned according to current medical practices, but problems sometimes occur. Call your health care provider if you have any problems or questions after your procedure. What can I expect after the procedure? After your procedure, you may:  Feel sleepy for several hours.  Feel clumsy and have poor balance for several hours.  Feel forgetful about what happened after the procedure.  Have poor judgment for several hours.  Feel nauseous or vomit.  Have a sore throat if you had a breathing tube during the procedure. Follow these instructions at home: For at least 24 hours after the procedure:      Have a responsible adult stay with you. It is important to have someone help care for you until you are awake and alert.  Rest as needed.  Do not: ? Participate in activities in which you could fall or become injured. ? Drive. ? Use heavy machinery. ? Drink alcohol. ? Take sleeping pills or medicines that cause drowsiness. ? Make important decisions or sign legal documents. ? Take care of children on your own. Eating and drinking  Follow the diet that is recommended by your health care provider.  If you vomit, drink water, juice, or soup when you can drink without vomiting.  Make sure you have little or no nausea before eating solid foods. General instructions  Take over-the-counter and prescription medicines only as told by your health care provider.  If you have sleep apnea, surgery and  certain medicines can increase your risk for breathing problems. Follow instructions from your health care provider about wearing your sleep device: ? Anytime you are sleeping, including during daytime naps. ? While taking prescription pain medicines, sleeping medicines, or medicines that make you drowsy.  If you smoke, do not smoke without supervision.  Keep all follow-up visits as told by your health care provider. This is important. Contact a health care provider if:  You keep feeling nauseous or you keep vomiting.  You feel light-headed.  You develop a rash.  You have a fever. Get help right away if:  You have trouble breathing. Summary  For several hours after your procedure, you may feel sleepy and have poor judgment.  Have a responsible adult stay with you for at least 24 hours or until you are awake and alert. This information is not intended to replace advice given to you by your health care provider. Make sure you discuss any questions you have with your health care provider. Document Revised: 09/19/2017 Document Reviewed: 10/12/2015 Elsevier Patient Education  [place].

## 2019-12-26 ENCOUNTER — Telehealth: Payer: Self-pay | Admitting: Cardiovascular Disease

## 2019-12-26 ENCOUNTER — Encounter (HOSPITAL_COMMUNITY)
Admission: RE | Admit: 2019-12-26 | Discharge: 2019-12-26 | Disposition: A | Discharge status: Home / Self Care | Payer: Medicare Other | Source: Ambulatory Visit | Attending: Internal Medicine | Admitting: Internal Medicine

## 2019-12-26 ENCOUNTER — Telehealth (INDEPENDENT_AMBULATORY_CARE_PROVIDER_SITE_OTHER): Payer: Self-pay | Admitting: *Deleted

## 2019-12-26 ENCOUNTER — Other Ambulatory Visit (HOSPITAL_COMMUNITY): Admission: RE | Admit: 2019-12-26 | Payer: Medicare Other | Source: Ambulatory Visit

## 2019-12-26 NOTE — Telephone Encounter (Signed)
Patient canceled procedure sch'd for 12/28/19 due to other health issues at this time

## 2019-12-26 NOTE — Telephone Encounter (Signed)
Pt c/o swelling: STAT is pt has developed SOB within 24 hours  1) How much weight have you gained and in what time span?  5 lbs 1-2 days   2) If swelling, where is the swelling located? Feet   3) Are you currently taking a fluid pill? Yes but thinks it should be changed   4) Are you currently SOB? Yes audible wheezing   5) Do you have a log of your daily weights (if so, list)? No   6) Have you gained 3 pounds in a day or 5 pounds in a week? yes  7) Have you traveled recently? Yes

## 2019-12-26 NOTE — Telephone Encounter (Signed)
Spoke with patient and she reports increased swelling. States that weight is currently 180 pounds. She reports that the fluid pill is no longer working for her. She is taking torsemide 40 mg twice a day but it is not helping with the swelling. She reports not being able to do anything due to the increased swelling. Advised that I would send message to Dr. Rockey Situ for further recommendations and encouraged her to keep legs elevated. Made her aware that provider is out of office this week and will send to him and call back with any further recommendations. She states that a home health nurse mentioned spironolactone but reviewed that due to her kidney issues we need to check with Dr. Rockey Situ first. She verbalized understanding of our conversation, agreement with plan, and had no further questions at this time.

## 2019-12-27 ENCOUNTER — Encounter (HOSPITAL_COMMUNITY): Payer: Self-pay

## 2019-12-27 ENCOUNTER — Other Ambulatory Visit (HOSPITAL_COMMUNITY): Payer: Medicare Other

## 2019-12-27 NOTE — Telephone Encounter (Signed)
Can she call us with some heart rates On prior visit was in atrial fibrillation and rate was elevated, metoprolol was increased It could be the atrial fibrillation is causing some of her breathing On prior office visit weight was 180, so does not seem to have changed much  Would recommend metolazone 2.5 mg 30 minutes before a.m. torsemide 40 with a 40 mg in the p.m. She could do this regimen for 2 days in a row metolazone would only be taken very sparingly avoid dehydration

## 2019-12-28 ENCOUNTER — Ambulatory Visit (HOSPITAL_COMMUNITY): Admission: RE | Admit: 2019-12-28 | Payer: Medicare Other | Source: Home / Self Care | Admitting: Internal Medicine

## 2019-12-28 ENCOUNTER — Encounter (HOSPITAL_COMMUNITY): Admission: RE | Payer: Self-pay | Source: Home / Self Care

## 2019-12-28 SURGERY — ESOPHAGOGASTRODUODENOSCOPY (EGD) WITH PROPOFOL
Anesthesia: Monitor Anesthesia Care

## 2019-12-28 NOTE — Telephone Encounter (Signed)
Noted. Can call back to reschedule when able.

## 2020-01-01 MED ORDER — METOLAZONE 2.5 MG PO TABS: 2.5000 mg | ORAL_TABLET | ORAL | 3 refills | Status: AC

## 2020-01-01 NOTE — Telephone Encounter (Signed)
Spoke with patient and reviewed provider recommendations and she was agreeable with this plan. Reviewed instructions and importance of only taking metolazone sparingly after the initial 2 days dose. She verbalized understanding of all instructions, agreement with plan, and had no further questions at this time.

## 2020-01-28 ENCOUNTER — Telehealth: Payer: Self-pay | Admitting: Cardiovascular Disease

## 2020-01-28 NOTE — Telephone Encounter (Signed)
Patient would like to go over what medications she should be taking. Please call to discuss.

## 2020-01-28 NOTE — Telephone Encounter (Signed)
Pt reports that medications have been changed several times and she wants to make sure of what she should be taking.   It is hard to tell with current list as it looks like she is taking different than prescribed.   I will forward onto Dr. Gwenyth Ober nurse so that she can review medications with Dr. Rockey Situ and call patient to confirm.

## 2020-01-30 IMAGING — DX DG SACRUM/COCCYX 2+V
3 series · 3 of 3 positions shown · non-contrast
Comparison: Pelvis film 05/12/2018

CLINICAL DATA: Fall, sacral pain.  Recent sacral plasty

EXAM:
SACRUM AND COCCYX - 2+ VIEW

[t sacrum ap]
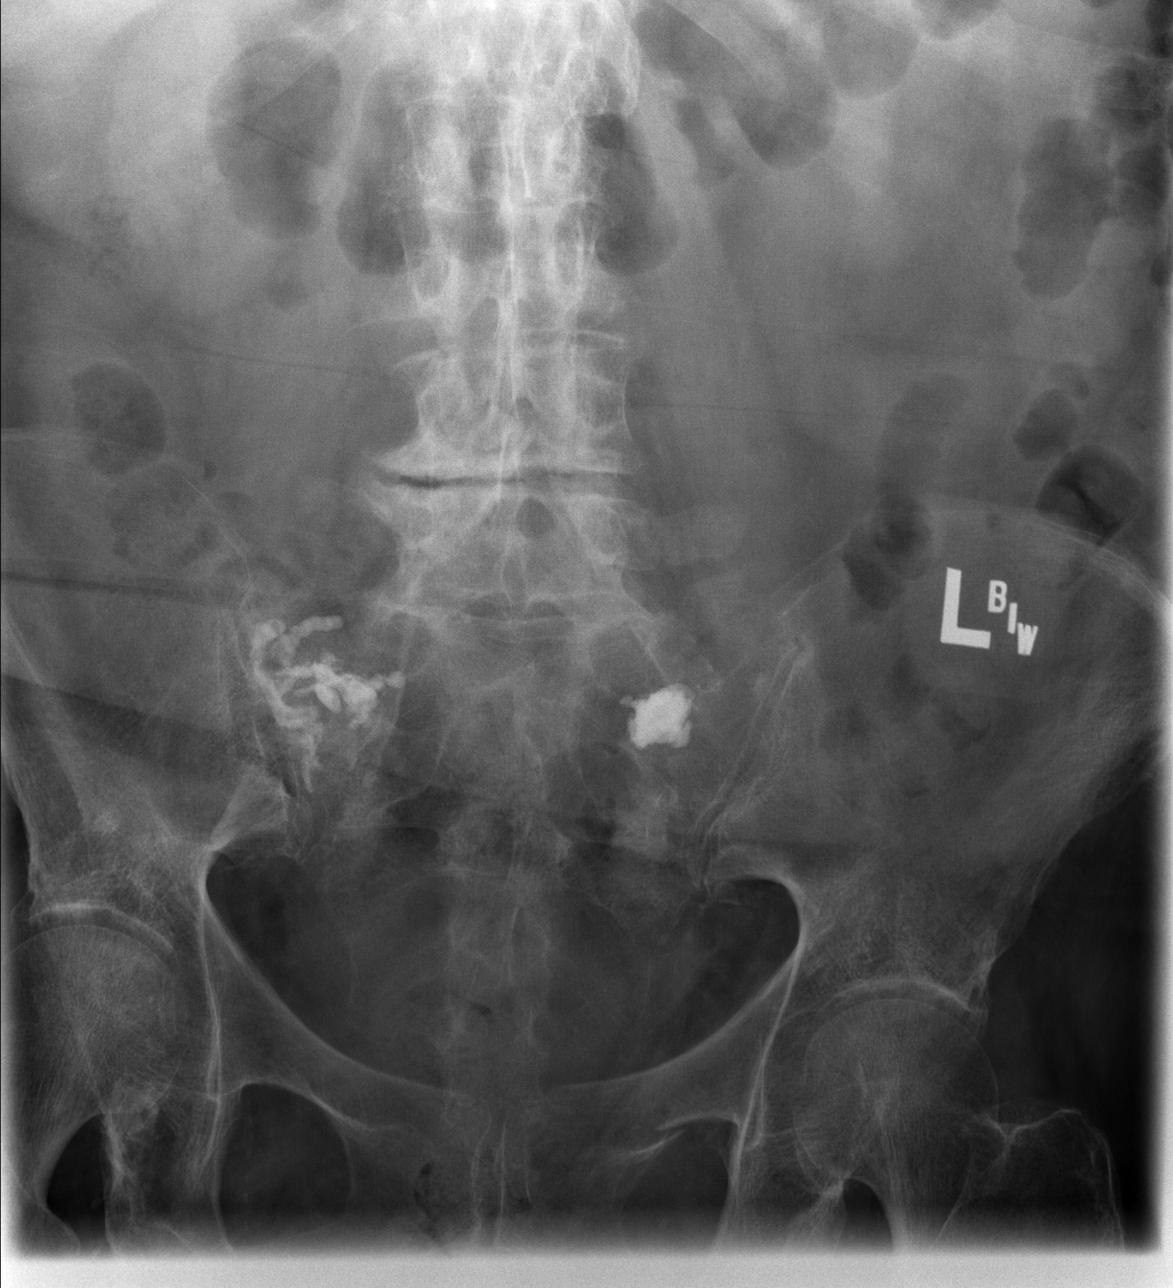

[t coccyx ap]
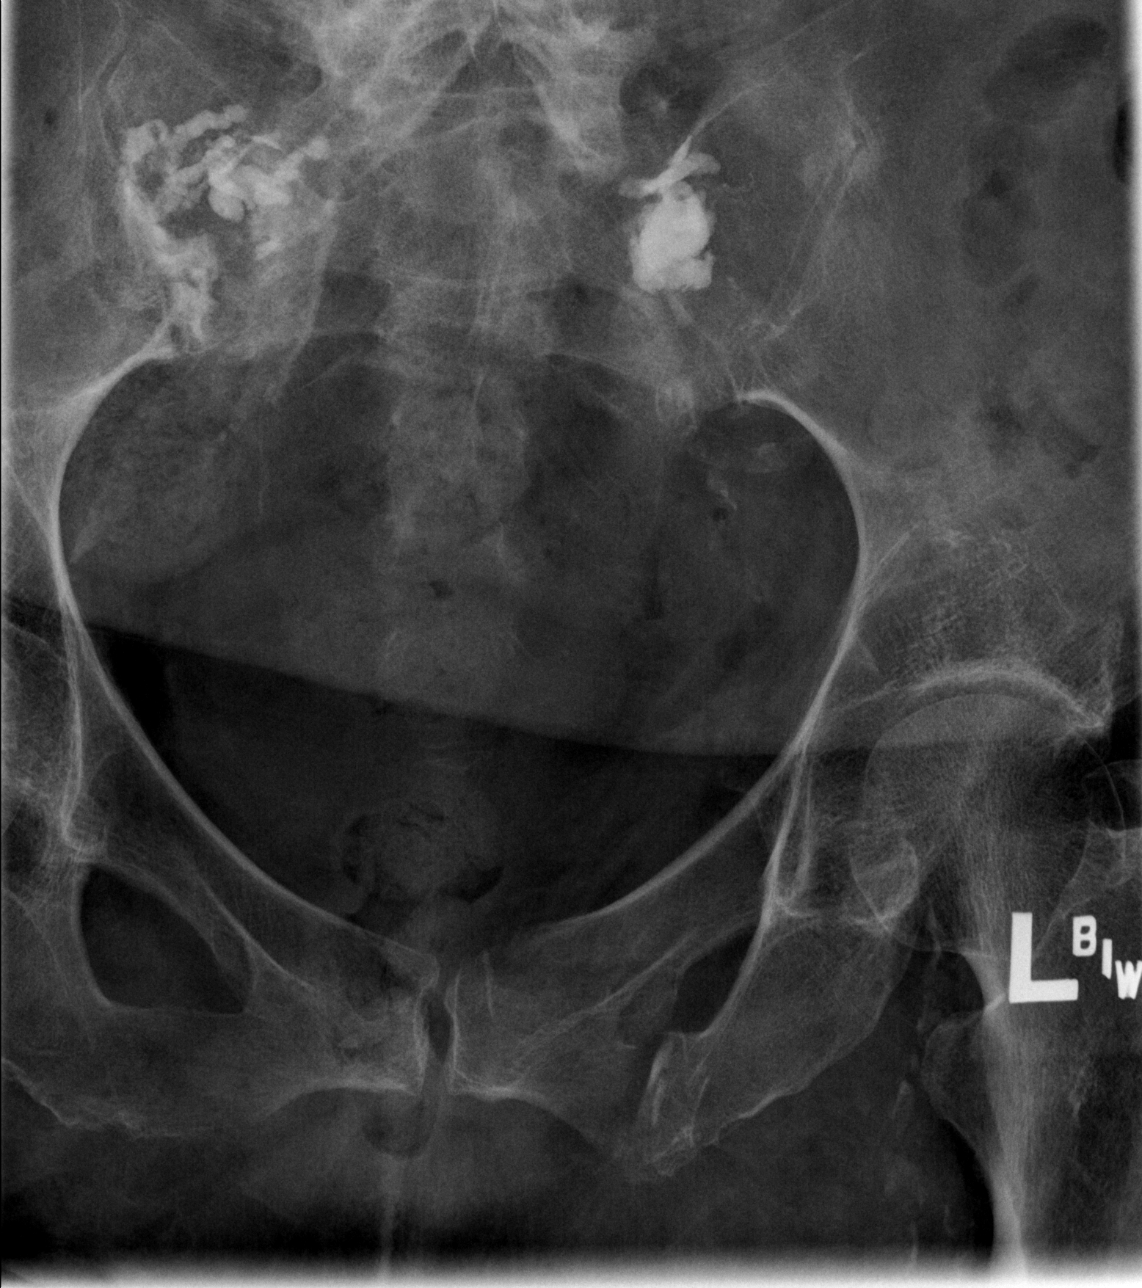

[t sacrum coccyx lat]
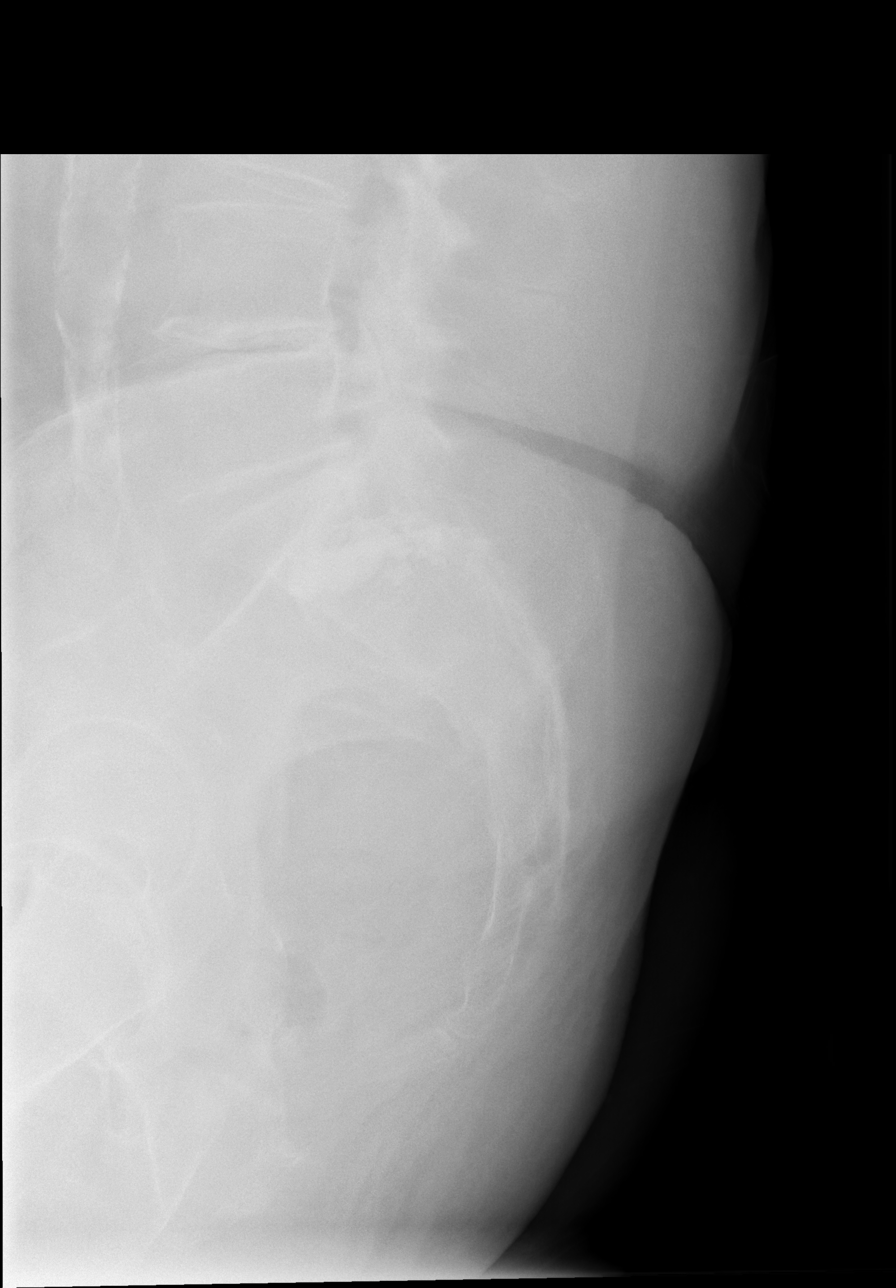

[3 of 3 positions shown; findings below may reference images not displayed]

FINDINGS: Sacroplasty cement noted in LEFT and RIGHT sacral ala. No evidence
of sacral fracture. No displaced coccygeal fracture.

Atherosclerotic calcification of the aorta..
IMPRESSION: No evidence of acute sacral or coccygeal fracture.

## 2020-01-30 NOTE — Telephone Encounter (Signed)
Patient checking status of call back

## 2020-01-31 NOTE — Telephone Encounter (Signed)
Left voicemail message to call back  

## 2020-02-01 NOTE — Telephone Encounter (Signed)
Spoke to patient and she wants help because she saw her kidney doctor and they say she may need dialysis. She wants Dr. Rockey Situ to help or she will find someone who will. Advised that we can help with any heart problems but she should talk with her kidney doctor about her concerns. Then she wanted to see if I could find a place to send her pill packs. Advised that I am not sure of who is able to do that service but I will call a couple to check for her. She states that she wants to see Dr. Rockey Situ. Advised he is on vacation and no appointments for a few months. Offered virtual visit with him where they can talk by phone. She was agreeable with that and scheduled for telephone/virtual visit. She verbalized understanding of our conversation and let her know I would check on pill pack with a couple of places and see if I can get her some assistance.

## 2020-02-06 NOTE — Telephone Encounter (Signed)
No answer/No voicemail [facility] can provide the service she uses.  Number is: [phone]

## 2020-02-06 NOTE — Telephone Encounter (Signed)
Spoke with patient and reviewed that medications can be done with pill pack at [facility] and provided her with their number. Reviewed some of her medications and they do not match our records. She states that she does not have any of some and others she can't find. Then she told me she is taking torsemide 100 mg and I have listed 40 mg twice a day. She denies having any metolazone and unsure where those pills are. Advised I would call her Kidney provider to see what they have listed. Spoke with her pharmacy in [place] and they just filled both 20 mg torsemide and 100 mg torsemide and she should have plenty of that on hand. They said [facility]one [facility]. Will update patient.

## 2020-02-06 NOTE — Telephone Encounter (Signed)
Spoke with patient and reviewed that I did leave message over at her kidney provider and awaiting return call. Advised that once I hear back from kidney specialist then I would be in touch. She did mention additional concern with leg swelling to the point they are weeping. With her medication confusion not sure she is taking medications correctly. She currently has them wrapped. In the meantime I advised she continue to wrap her legs with gauze to help with swelling, weeping, and keep them elevated. She verbalized understanding with no further questions at this time.

## 2020-02-13 ENCOUNTER — Telehealth: Payer: Medicare Other | Admitting: Cardiovascular Disease

## 2020-02-14 ENCOUNTER — Telehealth: Payer: Self-pay | Admitting: Cardiovascular Disease

## 2020-02-14 NOTE — Telephone Encounter (Signed)
Please call to discuss medications to be sent in a "bubble pack". Daughter in law is calling, but not sure which medications.

## 2020-02-14 NOTE — Telephone Encounter (Signed)
Spoke with patients daughter in law regarding the pill packs. It looks like Pam had discussed this with the patient but the daughter in law wanted some more information. I read the previous telephone note from [facility] regarding using [facility]. Patients Daughter in law would like a call back from [facility] to discuss.

## 2020-02-15 NOTE — Telephone Encounter (Signed)
Spoke with patients daughter per release form and provided her with pharmacy information for pill packs. Also encouraged her to speak with her primary care provider about some home health given her increased confusion and limited movement. She verbalized understanding of our conversation, agreement with plan, and had no further questions at this time.

## 2020-03-05 DEATH — deceased

## 2020-04-25 ENCOUNTER — Ambulatory Visit: Payer: Self-pay | Admitting: Cardiovascular Disease
